# Patient Record
Sex: Female | Born: 1960 | Race: White | Hispanic: No | Smoking: Never smoker
Health system: Southern US, Community
[De-identification: ages and names within clinical notes are randomized; demographics above are authoritative.]

## PROBLEM LIST (undated history)

## (undated) DIAGNOSIS — E785 Hyperlipidemia, unspecified: Secondary | ICD-10-CM

## (undated) DIAGNOSIS — R21 Rash and other nonspecific skin eruption: Secondary | ICD-10-CM

## (undated) DIAGNOSIS — I10 Essential (primary) hypertension: Secondary | ICD-10-CM

## (undated) DIAGNOSIS — Z800 Family history of malignant neoplasm of digestive organs: Secondary | ICD-10-CM

## (undated) DIAGNOSIS — F329 Major depressive disorder, single episode, unspecified: Secondary | ICD-10-CM

## (undated) DIAGNOSIS — M1710 Unilateral primary osteoarthritis, unspecified knee: Secondary | ICD-10-CM

## (undated) DIAGNOSIS — Z978 Presence of other specified devices: Secondary | ICD-10-CM

## (undated) DIAGNOSIS — M329 Systemic lupus erythematosus, unspecified: Principal | ICD-10-CM

## (undated) DIAGNOSIS — M25562 Pain in left knee: Secondary | ICD-10-CM

## (undated) DIAGNOSIS — Z0000 Encounter for general adult medical examination without abnormal findings: Secondary | ICD-10-CM

## (undated) DIAGNOSIS — I428 Other cardiomyopathies: Secondary | ICD-10-CM

## (undated) DIAGNOSIS — M1990 Unspecified osteoarthritis, unspecified site: Secondary | ICD-10-CM

## (undated) DIAGNOSIS — R300 Dysuria: Secondary | ICD-10-CM

## (undated) DIAGNOSIS — M3219 Other organ or system involvement in systemic lupus erythematosus: Principal | ICD-10-CM

## (undated) DIAGNOSIS — I517 Cardiomegaly: Secondary | ICD-10-CM

## (undated) DIAGNOSIS — Z1231 Encounter for screening mammogram for malignant neoplasm of breast: Secondary | ICD-10-CM

## (undated) DIAGNOSIS — R102 Pelvic and perineal pain: Secondary | ICD-10-CM

## (undated) DIAGNOSIS — M1712 Unilateral primary osteoarthritis, left knee: Secondary | ICD-10-CM

## (undated) DIAGNOSIS — R5383 Other fatigue: Secondary | ICD-10-CM

## (undated) DIAGNOSIS — Z1211 Encounter for screening for malignant neoplasm of colon: Secondary | ICD-10-CM

## (undated) DIAGNOSIS — R0602 Shortness of breath: Secondary | ICD-10-CM

## (undated) DIAGNOSIS — I483 Typical atrial flutter: Principal | ICD-10-CM

## (undated) DIAGNOSIS — N63 Unspecified lump in unspecified breast: Secondary | ICD-10-CM

## (undated) DIAGNOSIS — R002 Palpitations: Secondary | ICD-10-CM

## (undated) DIAGNOSIS — R928 Other abnormal and inconclusive findings on diagnostic imaging of breast: Secondary | ICD-10-CM

## (undated) DIAGNOSIS — E782 Mixed hyperlipidemia: Secondary | ICD-10-CM

## (undated) DIAGNOSIS — Z96652 Presence of left artificial knee joint: Secondary | ICD-10-CM

## (undated) HISTORY — DX: Essential (primary) hypertension: I10

## (undated) HISTORY — DX: Presence of other specified devices: Z97.8

## (undated) HISTORY — DX: Morbid (severe) obesity due to excess calories: E66.01

## (undated) HISTORY — DX: Hyperlipidemia, unspecified: E78.5

## (undated) HISTORY — DX: Unilateral primary osteoarthritis, unspecified knee: M17.10

## (undated) HISTORY — DX: Major depressive disorder, single episode, unspecified: F32.9

## (undated) HISTORY — DX: Rash and other nonspecific skin eruption: R21

## (undated) HISTORY — DX: Family history of malignant neoplasm of digestive organs: Z80.0

---
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
## 2004-02-20 ENCOUNTER — Encounter: Payer: Self-pay | Admitting: Internal Medicine

## 2004-02-20 LAB — CONVERTED CEMR LAB

## 2004-12-31 ENCOUNTER — Ambulatory Visit: Payer: Self-pay | Admitting: Internal Medicine

## 2005-07-20 ENCOUNTER — Ambulatory Visit: Payer: Self-pay | Admitting: Internal Medicine

## 2005-08-03 ENCOUNTER — Ambulatory Visit: Payer: Self-pay | Admitting: Internal Medicine

## 2005-08-22 HISTORY — PX: GASTRIC BYPASS: SHX52

## 2005-09-26 ENCOUNTER — Encounter: Admission: RE | Admit: 2005-09-26 | Discharge: 2005-12-25 | Payer: Self-pay | Admitting: General Surgery

## 2005-09-29 ENCOUNTER — Ambulatory Visit (HOSPITAL_COMMUNITY): Admission: RE | Admit: 2005-09-29 | Discharge: 2005-09-29 | Payer: Self-pay | Admitting: General Surgery

## 2005-10-07 ENCOUNTER — Ambulatory Visit (HOSPITAL_COMMUNITY): Admission: RE | Admit: 2005-10-07 | Discharge: 2005-10-07 | Payer: Self-pay | Admitting: General Surgery

## 2006-01-30 ENCOUNTER — Encounter: Admission: RE | Admit: 2006-01-30 | Discharge: 2006-01-30 | Payer: Self-pay | Admitting: General Surgery

## 2006-02-20 ENCOUNTER — Inpatient Hospital Stay (HOSPITAL_COMMUNITY): Admission: RE | Admit: 2006-02-20 | Discharge: 2006-02-22 | Payer: Self-pay | Admitting: General Surgery

## 2006-02-21 ENCOUNTER — Encounter: Payer: Self-pay | Admitting: Vascular Surgery

## 2006-02-27 ENCOUNTER — Ambulatory Visit: Payer: Self-pay | Admitting: Internal Medicine

## 2006-03-01 ENCOUNTER — Encounter: Admission: RE | Admit: 2006-03-01 | Discharge: 2006-05-30 | Payer: Self-pay | Admitting: General Surgery

## 2006-07-05 ENCOUNTER — Encounter: Admission: RE | Admit: 2006-07-05 | Discharge: 2006-10-03 | Payer: Self-pay | Admitting: General Surgery

## 2006-12-18 ENCOUNTER — Encounter: Admission: RE | Admit: 2006-12-18 | Discharge: 2007-03-18 | Payer: Self-pay | Admitting: General Surgery

## 2007-03-12 ENCOUNTER — Encounter: Admission: RE | Admit: 2007-03-12 | Discharge: 2007-06-10 | Payer: Self-pay | Admitting: General Surgery

## 2007-03-16 ENCOUNTER — Ambulatory Visit: Payer: Self-pay | Admitting: Internal Medicine

## 2007-03-16 LAB — CONVERTED CEMR LAB
AST: 24 units/L (ref 0–37)
Albumin: 3.9 g/dL (ref 3.5–5.2)
BUN: 13 mg/dL (ref 6–23)
Basophils Absolute: 0 10*3/uL (ref 0.0–0.1)
Bilirubin Urine: NEGATIVE
CO2: 30 meq/L (ref 19–32)
Calcium: 8.9 mg/dL (ref 8.4–10.5)
Chloride: 102 meq/L (ref 96–112)
Folate: >20 ng/mL
GFR calc Af Amer: 117 mL/min
HCT: 38 % (ref 36.0–46.0)
HDL: 56.1 mg/dL (ref >39.0)
Ketones, ur: NEGATIVE mg/dL
LDL Cholesterol: 90 mg/dL (ref 0–99)
Leukocytes, UA: NEGATIVE
Neutro Abs: 5.1 10*3/uL (ref 1.4–7.7)
Nitrite: NEGATIVE
Potassium: 4.4 meq/L (ref 3.5–5.1)
Sodium: 138 meq/L (ref 135–145)
Total Bilirubin: 1.1 mg/dL (ref 0.3–1.2)
Urobilinogen, UA: 0.2 (ref 0.0–1.0)
VLDL: 12 mg/dL (ref 0–40)
Vitamin B-12: 337 pg/mL (ref 211–911)
WBC: 7.8 10*3/uL (ref 4.5–10.5)

## 2007-03-17 ENCOUNTER — Encounter: Payer: Self-pay | Admitting: Internal Medicine

## 2007-03-17 DIAGNOSIS — I10 Essential (primary) hypertension: Secondary | ICD-10-CM | POA: Insufficient documentation

## 2007-03-17 DIAGNOSIS — F3289 Other specified depressive episodes: Secondary | ICD-10-CM

## 2007-03-17 DIAGNOSIS — F329 Major depressive disorder, single episode, unspecified: Secondary | ICD-10-CM

## 2007-03-17 DIAGNOSIS — IMO0002 Reserved for concepts with insufficient information to code with codable children: Secondary | ICD-10-CM | POA: Insufficient documentation

## 2007-03-17 DIAGNOSIS — M1710 Unilateral primary osteoarthritis, unspecified knee: Secondary | ICD-10-CM

## 2007-03-17 DIAGNOSIS — E039 Hypothyroidism, unspecified: Secondary | ICD-10-CM | POA: Insufficient documentation

## 2007-03-17 HISTORY — DX: Major depressive disorder, single episode, unspecified: F32.9

## 2007-03-17 HISTORY — DX: Essential (primary) hypertension: I10

## 2007-03-17 HISTORY — DX: Reserved for concepts with insufficient information to code with codable children: IMO0002

## 2007-03-17 HISTORY — DX: Morbid (severe) obesity due to excess calories: E66.01

## 2007-03-17 HISTORY — DX: Other specified depressive episodes: F32.89

## 2007-04-21 ENCOUNTER — Ambulatory Visit (HOSPITAL_COMMUNITY): Admission: RE | Admit: 2007-04-21 | Discharge: 2007-04-21 | Payer: Self-pay | Admitting: Ophthalmology

## 2007-07-23 LAB — HM MAMMOGRAPHY: HM Mammogram: NORMAL

## 2008-05-13 ENCOUNTER — Telehealth (INDEPENDENT_AMBULATORY_CARE_PROVIDER_SITE_OTHER): Payer: Self-pay | Admitting: *Deleted

## 2008-05-15 ENCOUNTER — Telehealth (INDEPENDENT_AMBULATORY_CARE_PROVIDER_SITE_OTHER): Payer: Self-pay | Admitting: *Deleted

## 2008-05-15 ENCOUNTER — Ambulatory Visit: Payer: Self-pay | Admitting: Internal Medicine

## 2008-05-15 LAB — CONVERTED CEMR LAB
Basophils Absolute: 0 10*3/uL (ref 0.0–0.1)
Bilirubin, Direct: 0.2 mg/dL (ref 0.0–0.3)
Chloride: 104 meq/L (ref 96–112)
Cholesterol: 168 mg/dL (ref 0–200)
Creatinine, Ser: 0.8 mg/dL (ref 0.4–1.2)
Eosinophils Absolute: 0.1 10*3/uL (ref 0.0–0.7)
GFR calc Af Amer: 100 mL/min
Glucose, Bld: 91 mg/dL (ref 70–99)
HCT: 40.6 % (ref 36.0–46.0)
HDL: 60.1 mg/dL (ref >39.0)
Hemoglobin: 13.9 g/dL (ref 12.0–15.0)
Ketones, ur: NEGATIVE mg/dL
Monocytes Relative: 6.5 % (ref 3.0–12.0)
Neutro Abs: 6 10*3/uL (ref 1.4–7.7)
Nitrite: NEGATIVE
Potassium: 4.1 meq/L (ref 3.5–5.1)
RDW: 11.6 % (ref 11.5–14.6)
TSH: 1.53 microintl units/mL (ref 0.35–5.50)
Total Bilirubin: 0.8 mg/dL (ref 0.3–1.2)
Total Protein, Urine: NEGATIVE mg/dL
Total Protein: 6.7 g/dL (ref 6.0–8.3)
Triglycerides: 59 mg/dL (ref 0–149)
Urobilinogen, UA: 0.2 (ref 0.0–1.0)
WBC: 9 10*3/uL (ref 4.5–10.5)

## 2008-05-21 ENCOUNTER — Ambulatory Visit: Payer: Self-pay | Admitting: Internal Medicine

## 2008-09-02 ENCOUNTER — Encounter: Admission: RE | Admit: 2008-09-02 | Discharge: 2008-09-02 | Payer: Self-pay | Admitting: Chiropractic Medicine

## 2009-07-06 ENCOUNTER — Telehealth: Payer: Self-pay | Admitting: Internal Medicine

## 2009-07-22 LAB — CONVERTED CEMR LAB: Pap Smear: NORMAL

## 2009-11-17 ENCOUNTER — Ambulatory Visit: Payer: Self-pay | Admitting: Internal Medicine

## 2009-11-17 DIAGNOSIS — Z978 Presence of other specified devices: Secondary | ICD-10-CM

## 2009-11-17 DIAGNOSIS — R21 Rash and other nonspecific skin eruption: Secondary | ICD-10-CM

## 2009-11-17 HISTORY — DX: Rash and other nonspecific skin eruption: R21

## 2009-11-17 HISTORY — DX: Presence of other specified devices: Z97.8

## 2009-11-17 LAB — CONVERTED CEMR LAB
ALT: 18 units/L (ref 0–35)
AST: 31 units/L (ref 0–37)
BUN: 8 mg/dL (ref 6–23)
Basophils Relative: 0.2 % (ref 0.0–3.0)
Calcium: 9.2 mg/dL (ref 8.4–10.5)
Creatinine, Ser: 0.8 mg/dL (ref 0.4–1.2)
Eosinophils Relative: 1 % (ref 0.0–5.0)
GFR calc non Af Amer: 81.6 mL/min (ref >60)
HCT: 43.7 % (ref 36.0–46.0)
Hemoglobin: 14.2 g/dL (ref 12.0–15.0)
Ketones, ur: NEGATIVE mg/dL
LDL Cholesterol: 92 mg/dL (ref 0–99)
Lymphocytes Relative: 21.1 % (ref 12.0–46.0)
Neutro Abs: 4.5 10*3/uL (ref 1.4–7.7)
Platelets: 254 10*3/uL (ref 150.0–400.0)
Potassium: 4.3 meq/L (ref 3.5–5.1)
RDW: 12.6 % (ref 11.5–14.6)
Sodium: 142 meq/L (ref 135–145)
Specific Gravity, Urine: 1.025 (ref 1.000–1.030)
TSH: 1.47 microintl units/mL (ref 0.35–5.50)
Total Bilirubin: 0.8 mg/dL (ref 0.3–1.2)
Total CHOL/HDL Ratio: 2
Total Protein, Urine: NEGATIVE mg/dL
Urine Glucose: NEGATIVE mg/dL
Urobilinogen, UA: 0.2 (ref 0.0–1.0)
VLDL: 20.2 mg/dL (ref 0.0–40.0)
pH: 6 (ref 5.0–8.0)

## 2009-11-18 LAB — CONVERTED CEMR LAB: Vit D, 25-Hydroxy: 34 ng/mL (ref 30–89)

## 2010-09-23 NOTE — Assessment & Plan Note (Signed)
Summary: REFILLS/MAY NEED LABS/NWS   #   Vital Signs:  Patient profile:   50 year old female Height:      65.5 inches Weight:      202.50 pounds BMI:     33.31 O2 Sat:      97 % on Room air Temp:     97.9 degrees F oral Pulse rate:   59 / minute BP sitting:   114 / 82  (left arm) Cuff size:   regular  Vitals Entered ByZella Ball Ewing (November 17, 2009 8:16 AM)  O2 Flow:  Room air  Preventive Care Screening  Pap Smear:    Date:  07/22/2009    Results:  normal   CC: Medication refills, labs, rash on face/RE   CC:  Medication refills, labs, and rash on face/RE.  History of Present Illness: much stress with work adn recent completed divorce;  has about 10 to 15 lbs;  has some light snoring but no daytime somnolence;  plans to do more gym work, has a known leak in the right breast implant - needs mamogram ;  Pt denies CP, sob, doe, wheezing, orthopnea, pnd, worsening LE edema, palps, dizziness or syncope   Pt denies new neuro symptoms such as headache, facial or extremity weakness   Problems Prior to Update: 1)  Rash-nonvesicular  (ICD-782.1) 2)  Preventive Health Care  (ICD-V70.0) 3)  Degenerative Joint Disease, Right Knee  (ICD-715.96) 4)  Morbid Obesity  (ICD-278.01) 5)  Depression  (ICD-311) 6)  Hypothyroidism  (ICD-244.9) 7)  Hypertension  (ICD-401.9)  Medications Prior to Update: 1)  Cetirizine Hcl 10 Mg  Tabs (Cetirizine Hcl) .Marland Kitchen.. 1 By Mouth Once Daily As Needed 2)  Levothyroxine Sodium 112 Mcg  Tabs (Levothyroxine Sodium) .... Take 1 By Mouth Once Daily Overdue For Yearly Physical No Addtioanl Refills Until Appt 3)  Zoloft 50 Mg Tabs (Sertraline Hcl) .Marland Kitchen.. 1 Daily 4)  Naproxen 500 Mg Tabs (Naproxen) .... 2 X Daily 5)  Nefazodone Hcl 200 Mg Tabs (Nefazodone Hcl) .Marland Kitchen.. 1 Tab in The Am, 2 Tabs in The Pm 6)  Temazepam 30 Mg Caps (Temazepam) .... Prn 7)  Alprazolam 0.25 Mg Tabs (Alprazolam) .Marland Kitchen.. 1 Daily 8)  Multivital  Tabs (Multiple Vitamins-Minerals) 9)  B Complex 100   Tabs (B Complex Vitamins) 10)  Iron 11)  Cal/mag Citrate 250-125 Mg Tabs (Calcium-Magnesium) 12)  Benicar 20 Mg Tabs (Olmesartan Medoxomil) .... 1/2 By Mouth Once Daily  Current Medications (verified): 1)  Cetirizine Hcl 10 Mg  Tabs (Cetirizine Hcl) .Marland Kitchen.. 1 By Mouth Once Daily As Needed 2)  Levothyroxine Sodium 112 Mcg  Tabs (Levothyroxine Sodium) .... Take 1 By Mouth Once Daily 3)  Zoloft 50 Mg Tabs (Sertraline Hcl) .Marland Kitchen.. 1 Daily 4)  Naproxen 500 Mg Tabs (Naproxen) .... 2 X Daily 5)  Nefazodone Hcl 200 Mg Tabs (Nefazodone Hcl) .Marland Kitchen.. 1 Tab in The Am, 2 Tabs in The Pm 6)  Temazepam 30 Mg Caps (Temazepam) .... Prn 7)  Alprazolam 0.25 Mg Tabs (Alprazolam) .Marland Kitchen.. 1 Daily 8)  Multivital  Tabs (Multiple Vitamins-Minerals) 9)  B Complex 100  Tabs (B Complex Vitamins) 10)  Iron 11)  Cal/mag Citrate 250-125 Mg Tabs (Calcium-Magnesium) 12)  Benicar 20 Mg Tabs (Olmesartan Medoxomil) .Marland Kitchen.. 1 By Mouth Once Daily 13)  Triamcinolone Acetonide 0.5 % Crea (Triamcinolone Acetonide) .... Use Asd Two Times A Day As Needed  Allergies (verified): 1)  ! * Opiates 2)  ! Sulfa  Past History:  Past Medical History:  Last updated: 03/17/2007 Hypertension Hypothyroidism Depression Morbid Obesity R Knee DJD Alergies  Past Surgical History: Last updated: 03/17/2007 Gastric Bypass- 2007 Breast Augmentation -1981 Jaw Surgery- 1980  Family History: Last updated: 05/21/2008 DM - both parents father with heart disease, s/p MI x 3, elevated cholesterol grandfather and mother with dementia uncle with colon cancer grandfather with TIA grandmother with CHF  Social History: Last updated: 11/17/2009 no children work - [employer] Never Smoked Alcohol use-yes - social Divorced  Risk Factors: Smoking Status: never (05/21/2008)  Social History: Reviewed history from 05/21/2008 and no changes required. no children work - [employer] Never Smoked Alcohol use-yes - social Divorced  Review of  Systems  The patient denies anorexia, fever, vision loss, decreased hearing, hoarseness, chest pain, syncope, dyspnea on exertion, peripheral edema, prolonged cough, headaches, hemoptysis, abdominal pain, melena, hematochezia, severe indigestion/heartburn, hematuria, muscle weakness, transient blindness, difficulty walking, depression, unusual weight change, abnormal bleeding, enlarged lymph nodes, and angioedema.         all otherwise negative per pt -  - had some recent spotting with the IUD  s/p neg endometrial biopsy recent per GYN, also with small rash to glabellar area of the forehead with itching and mild erythema  Physical Exam  General:  alert and overweight-appearing.   Head:  normocephalic and atraumatic.   Eyes:  vision grossly intact, pupils equal, and pupils round.   Ears:  R ear normal and L ear normal.   Nose:  no external deformity and no nasal discharge.   Mouth:  no gingival abnormalities and pharynx pink and moist.   Neck:  supple and no masses.   Lungs:  normal respiratory effort and normal breath sounds.   Heart:  normal rate and regular rhythm.   Abdomen:  soft, non-tender, and normal bowel sounds.   Msk:  no joint tenderness and no joint swelling.   Extremities:  no edema, no erythema  Neurologic:  cranial nerves II-XII intact and strength normal in all extremities.   Skin:  mild erythema glabellar area with slight swelling, nontender Psych:  not depressed appearing and slightly anxious.     Impression & Recommendations:  Problem # 1:  Preventive Health Care (ICD-V70.0)  Overall doing well, age appropriate education and counseling updated and referral for appropriate preventive services done unless declined, immunizations up to date or declined, diet counseling done if overweight, urged to quit smoking if smokes , most recent labs reviewed and current ordered if appropriate, ecg reviewed or declined (interpretation per ECG scanned in the EMR if done); information  regarding Medicare Prevention requirements given if appropriate   Orders: T-Vitamin D (25-Hydroxy) (16109-60454) TLB-BMP (Basic Metabolic Panel-BMET) (80048-METABOL) TLB-CBC Platelet - w/Differential (85025-CBCD) TLB-Hepatic/Liver Function Pnl (80076-HEPATIC) TLB-Lipid Panel (80061-LIPID) TLB-TSH (Thyroid Stimulating Hormone) (84443-TSH) TLB-Udip ONLY (81003-UDIP)  Problem # 2:  RASH-NONVESICULAR (ICD-782.1)  Her updated medication list for this problem includes:    Triamcinolone Acetonide 0.5 % Crea (Triamcinolone acetonide) ..... Use asd two times a day as needed treat as above, f/u any worsening signs or symptoms   Problem # 3:  BREAST IMPLANTS, BILATERAL, HX OF (ICD-V43.82)  with known leak - for plastic surg referral, then mamogram to be pursued  Orders: Misc. Referral (Misc. Ref)  Problem # 4:  HYPERTENSION (ICD-401.9)  Her updated medication list for this problem includes:    Benicar 20 Mg Tabs (Olmesartan medoxomil) .Marland Kitchen... 1 by mouth once daily  BP today: 114/82 Prior BP: 114/72 (05/21/2008)  Labs Reviewed: K+:  4.1 (05/15/2008) Creat: : 0.8 (05/15/2008)   Chol: 168 (05/15/2008)   HDL: 60.1 (05/15/2008)   LDL: 96 (05/15/2008)   TG: 59 (05/15/2008) stable overall by hx and exam, ok to continue meds/tx as is   Complete Medication List: 1)  Cetirizine Hcl 10 Mg Tabs (Cetirizine hcl) .Marland Kitchen.. 1 by mouth once daily as needed 2)  Levothyroxine Sodium 112 Mcg Tabs (Levothyroxine sodium) .... Take 1 by mouth once daily 3)  Zoloft 50 Mg Tabs (Sertraline hcl) .Marland Kitchen.. 1 daily 4)  Naproxen 500 Mg Tabs (Naproxen) .... 2 x daily 5)  Nefazodone Hcl 200 Mg Tabs (Nefazodone hcl) .Marland Kitchen.. 1 tab in the am, 2 tabs in the pm 6)  Temazepam 30 Mg Caps (Temazepam) .... Prn 7)  Alprazolam 0.25 Mg Tabs (Alprazolam) .Marland Kitchen.. 1 daily 8)  Multivital Tabs (Multiple vitamins-minerals) 9)  B Complex 100 Tabs (B complex vitamins) 10)  Iron  11)  Cal/mag Citrate 250-125 Mg Tabs (Calcium-magnesium) 12)  Benicar  20 Mg Tabs (Olmesartan medoxomil) .Marland Kitchen.. 1 by mouth once daily 13)  Triamcinolone Acetonide 0.5 % Crea (Triamcinolone acetonide) .... Use asd two times a day as needed  Patient Instructions: 1)  You will be contacted about the referral(s) to: Dr Stephens November, plastic surgury 2)  please consider mammogram after the plastic surgeon 3)  Please go to the Lab in the basement for your blood and/or urine tests today 4)  Continue all previous medications as before this visit 5)  Please schedule a follow-up appointment in 1 year or sooner if needed 6)  remember to take only HALF of the benicar (even though the prescription says one per day, to save money) Prescriptions: TRIAMCINOLONE ACETONIDE 0.5 % CREA (TRIAMCINOLONE ACETONIDE) use asd two times a day as needed  #1 x 1   Entered and Authorized by:   Corwin Levins MD   Signed by:   Corwin Levins MD on 11/17/2009   Method used:   Print then Give to Patient   RxID:   [id number] LEVOTHYROXINE SODIUM 112 MCG  TABS (LEVOTHYROXINE SODIUM) TAKE 1 by mouth once daily  #90 x 3   Entered and Authorized by:   Corwin Levins MD   Signed by:   Corwin Levins MD on 11/17/2009   Method used:   Print then Give to Patient   RxID:   [id number] BENICAR 20 MG TABS (OLMESARTAN MEDOXOMIL) 1 by mouth once daily  #90 x 3   Entered and Authorized by:   Corwin Levins MD   Signed by:   Corwin Levins MD on 11/17/2009   Method used:   Print then Give to Patient   RxID:   [id number] LEVOTHYROXINE SODIUM 112 MCG  TABS (LEVOTHYROXINE SODIUM) TAKE 1 by mouth once daily  #90 x 3   Entered and Authorized by:   Corwin Levins MD   Signed by:   Corwin Levins MD on 11/17/2009   Method used:   Print then Give to Patient   RxID:   [id number] CETIRIZINE HCL 10 MG  TABS (CETIRIZINE HCL) 1 by mouth once daily as needed  #90 x 3   Entered and Authorized by:   Corwin Levins MD   Signed by:   Corwin Levins MD on 11/17/2009   Method used:   Print then Give to Patient   RxID:    [id number]

## 2010-12-07 ENCOUNTER — Other Ambulatory Visit: Payer: Self-pay | Admitting: Internal Medicine

## 2010-12-07 ENCOUNTER — Other Ambulatory Visit (INDEPENDENT_AMBULATORY_CARE_PROVIDER_SITE_OTHER): Payer: BLUE CROSS/BLUE SHIELD

## 2010-12-07 DIAGNOSIS — Z0000 Encounter for general adult medical examination without abnormal findings: Secondary | ICD-10-CM

## 2010-12-07 LAB — HEPATIC FUNCTION PANEL
AST: 24 U/L (ref 0–37)
Total Bilirubin: 0.9 mg/dL (ref 0.3–1.2)

## 2010-12-07 LAB — URINALYSIS
Bilirubin Urine: NEGATIVE
Ketones, ur: NEGATIVE
Leukocytes, UA: NEGATIVE
Nitrite: NEGATIVE
Specific Gravity, Urine: 1.02 (ref 1.000–1.030)
Urobilinogen, UA: 0.2 (ref 0.0–1.0)

## 2010-12-07 LAB — BASIC METABOLIC PANEL
Calcium: 9.2 mg/dL (ref 8.4–10.5)
Creatinine, Ser: 0.9 mg/dL (ref 0.4–1.2)
GFR: 73.74 mL/min (ref >60.00)

## 2010-12-07 LAB — LIPID PANEL
Cholesterol: 182 mg/dL (ref 0–200)
LDL Cholesterol: 96 mg/dL (ref 0–99)

## 2010-12-07 LAB — CBC WITH DIFFERENTIAL/PLATELET
Basophils Absolute: 0 10*3/uL (ref 0.0–0.1)
Eosinophils Absolute: 0.2 10*3/uL (ref 0.0–0.7)
HCT: 39.5 % (ref 36.0–46.0)
Lymphs Abs: 2.1 10*3/uL (ref 0.7–4.0)
MCHC: 33.9 g/dL (ref 30.0–36.0)
MCV: 99.2 fl (ref 78.0–100.0)
Monocytes Absolute: 0.6 10*3/uL (ref 0.1–1.0)
Monocytes Relative: 8.6 % (ref 3.0–12.0)
Neutro Abs: 4.2 10*3/uL (ref 1.4–7.7)
RBC: 3.98 Mil/uL (ref 3.87–5.11)
WBC: 7 10*3/uL (ref 4.5–10.5)

## 2010-12-11 DIAGNOSIS — E039 Hypothyroidism, unspecified: Secondary | ICD-10-CM

## 2010-12-12 ENCOUNTER — Encounter: Payer: Self-pay | Admitting: Internal Medicine

## 2010-12-12 DIAGNOSIS — Z0000 Encounter for general adult medical examination without abnormal findings: Secondary | ICD-10-CM | POA: Insufficient documentation

## 2010-12-14 ENCOUNTER — Ambulatory Visit (INDEPENDENT_AMBULATORY_CARE_PROVIDER_SITE_OTHER): Payer: BLUE CROSS/BLUE SHIELD | Admitting: Internal Medicine

## 2010-12-14 ENCOUNTER — Encounter: Payer: Self-pay | Admitting: Internal Medicine

## 2010-12-14 VITALS — BP 124/82 | HR 65 | Temp 98.1°F | Ht 65.0 in | Wt 218.0 lb

## 2010-12-14 DIAGNOSIS — Z0000 Encounter for general adult medical examination without abnormal findings: Secondary | ICD-10-CM

## 2010-12-14 DIAGNOSIS — E785 Hyperlipidemia, unspecified: Secondary | ICD-10-CM

## 2010-12-14 MED ORDER — LEVOTHYROXINE SODIUM 112 MCG PO TABS: 112.0000 ug | ORAL_TABLET | Freq: Every day | ORAL | Status: DC

## 2010-12-14 MED ORDER — OLMESARTAN MEDOXOMIL 20 MG PO TABS: 20.0000 mg | ORAL_TABLET | Freq: Every day | ORAL | Status: DC

## 2010-12-14 MED ORDER — CETIRIZINE HCL 10 MG PO TABS: 10.0000 mg | ORAL_TABLET | Freq: Every day | ORAL | Status: DC

## 2010-12-14 NOTE — Patient Instructions (Signed)
Continue all other medications as before Please continue lower cholesterol diet and exercise as you have planned Your refills were sent to the pharmacy Please return in 1 year for your yearly visit, or sooner if needed, with Lab testing done 3-5 days before

## 2010-12-19 ENCOUNTER — Encounter: Payer: Self-pay | Admitting: Internal Medicine

## 2010-12-19 DIAGNOSIS — E785 Hyperlipidemia, unspecified: Secondary | ICD-10-CM

## 2010-12-19 HISTORY — DX: Hyperlipidemia, unspecified: E78.5

## 2010-12-19 NOTE — Assessment & Plan Note (Addendum)
stable overall by hx and exam, most recent lab reviewed with pt, and pt to continue medical treatment as before, to follow lower chol diet  Lab Results  Component Value Date   LDLCALC 96 12/07/2010

## 2010-12-19 NOTE — Assessment & Plan Note (Signed)

## 2010-12-19 NOTE — Progress Notes (Signed)
Subjective:    Patient ID: Shari Navarro, female    DOB: 30-Dec-1960, 50 y.o.   MRN: [id number]  HPI  Here for wellness and f/u;  Overall doing ok;  Pt denies CP, worsening SOB, DOE, wheezing, orthopnea, PND, worsening LE edema, palpitations, dizziness or syncope.  Pt denies neurological change such as new Headache, facial or extremity weakness.  Pt denies polydipsia, polyuria, or low sugar symptoms. Pt states overall good compliance with treatment and medications, good tolerability, and trying to follow lower cholesterol diet.  Pt denies worsening depressive symptoms, suicidal ideation or panic. No fever, wt loss, night sweats, loss of appetite, or other constitutional symptoms.  Pt states good ability with ADL's, low fall risk, home safety reviewed and adequate, no significant changes in hearing or vision, and occasionally active with exercise.  Denies hyper or hypo thyroid symptoms such as voice, skin or hair change. Past Medical History  Diagnosis Date  . Morbid obesity 03/17/2007  . DEPRESSION 03/17/2007  . HYPERTENSION 03/17/2007  . DEGENERATIVE JOINT DISEASE, RIGHT KNEE 03/17/2007  . RASH-NONVESICULAR 11/17/2009  . BREAST IMPLANTS, BILATERAL, HX OF 11/17/2009   Past Surgical History  Procedure Date  . Gastric bypass 2007  . Breast enhancement surgery 1981  . Jaw surgury 1980    reports that she has never smoked. She does not have any smokeless tobacco history on file. She reports that she drinks alcohol. Her drug history not on file. family history includes Cancer in her other; Diabetes in her father and mother; Heart attack in her father; Heart disease in her father and other; Hyperlipidemia in her father; and Transient ischemic attack in her other. Allergies  Allergen Reactions  . Sulfonamide Derivatives    Current Outpatient Prescriptions on File Prior to Visit  Medication Sig Dispense Refill  . ALPRAZolam (XANAX) 0.25 MG tablet Take 0.25 mg by mouth daily.        . B Complex Vitamins  (B COMPLEX 100 PO) Take by mouth daily.        . Calcium-Magnesium 250-125 MG TABS Take by mouth daily.        . Multiple Vitamin (MULTIVITAMIN) capsule Take 1 capsule by mouth daily.        . nefazodone (SERZONE) 200 MG tablet 1 tablet in the morning and 2 tablets in the evening       . sertraline (ZOLOFT) 50 MG tablet Take 50 mg by mouth daily.        . temazepam (RESTORIL) 30 MG capsule Take 30 mg by mouth as needed.        . naproxen (NAPROSYN) 500 MG tablet Take 500 mg by mouth 2 (two) times daily.        Marland Kitchen triamcinolone (KENALOG) 0.5 % cream Apply topically 2 (two) times daily.         Review of Systems Review of Systems  Constitutional: Negative for diaphoresis, activity change, appetite change and unexpected weight change.  HENT: Negative for hearing loss, ear pain, facial swelling, mouth sores and neck stiffness.   Eyes: Negative for pain, redness and visual disturbance.  Respiratory: Negative for shortness of breath and wheezing.   Cardiovascular: Negative for chest pain and palpitations.  Gastrointestinal: Negative for diarrhea, blood in stool, abdominal distention and rectal pain.  Genitourinary: Negative for hematuria, flank pain and decreased urine volume.  Musculoskeletal: Negative for myalgias and joint swelling.  Skin: Negative for color change and wound.  Neurological: Negative for syncope and numbness.  Hematological: Negative for adenopathy.  Psychiatric/Behavioral: Negative for hallucinations, self-injury, decreased concentration and agitation.      Objective:   Physical Exam BP 124/82  Pulse 65  Temp(Src) 98.1 F (36.7 C) (Oral)  Ht 5\' 5"  (1.651 m)  Wt 218 lb (98.884 kg)  BMI 36.28 kg/m2  SpO2 96% Physical Exam  VS noted Constitutional: Pt is oriented to person, place, and time. Appears well-developed and well-nourished.  HENT:  Head: Normocephalic and atraumatic.  Right Ear: External ear normal.  Left Ear: External ear normal.  Nose: Nose normal.    Mouth/Throat: Oropharynx is clear and moist.  Eyes: Conjunctivae and EOM are normal. Pupils are equal, round, and reactive to light.  Neck: Normal range of motion. Neck supple. No JVD present. No tracheal deviation present.  Cardiovascular: Normal rate, regular rhythm, normal heart sounds and intact distal pulses.   Pulmonary/Chest: Effort normal and breath sounds normal.  Abdominal: Soft. Bowel sounds are normal. There is no tenderness.  Musculoskeletal: Normal range of motion. Exhibits no edema.  Lymphadenopathy:  Has no cervical adenopathy.  Neurological: Pt is alert and oriented to person, place, and time. Pt has normal reflexes. No cranial nerve deficit.  Skin: Skin is warm and dry. No rash noted.  Psychiatric:  Has  normal mood and affect. Behavior is normal. except 1+ nervous       Assessment & Plan:

## 2011-01-04 NOTE — Op Note (Signed)
NAMESALLE, Shari Navarro                  ACCOUNT NO.:  [id number]   MEDICAL RECORD NO.:  [id number]          PATIENT TYPE:  AMB   LOCATION:  SDS                          FACILITY:  [facility]   PHYSICIAN:  Alford Highland. Rankin, M.D.   DATE OF BIRTH:  05-17-1961   DATE OF PROCEDURE:  04/21/2007  DATE OF DISCHARGE:                               OPERATIVE REPORT   PREOPERATIVE DIAGNOSIS:  Rhegmatogenous detachment - macula on, left  eye.   POSTOPERATIVE DIAGNOSIS:  Rhegmatogenous detachment - macula on, left  eye.   SURGICAL PROCEDURE:  Scleral buckle retinal cryopexy left eye using 287  solid silicone explant.   SURGEON:  Alford Highland. Rankin, M.D.   ANESTHESIA:  General endotracheal anesthesia.   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  The  patient understands this is an attempt to reattach the retina and  release the traction.  She understands the risks of anesthesia including  rare occurrence of death, but also to the eye including to but not  limited to from the condition, surgical repair, hemorrhage, infection,  scarring, need for further surgery, no change in vision, loss of vision,  and progression of disease despite intervention.  Appropriate signed  consent was obtained and the patient taken to the operating room.   DESCRIPTION OF PROCEDURE:  In the operating room, appropriate monitors  followed by mild sedation.  General endotracheal anesthesia was  instituted without difficulty.  The left periocular region was then  sterilely prepped and draped in the usual ophthalmic fashion.  The lid  speculum applied.  The supratemporal quadrant was then opened and the  superior and the lateral rectus muscles were isolated on 2-0 silk ties  and direct  ophthalmoscopy was then performed to place the retinal  cryopexy around the retinal break, which was found.  Other breaks along  the vitreous base were not found but a spot was applied.   The 287 solid silicone explant was then placed in the supratemporal  quadrant and tied temporarily.  During this procedure, there was  spontaneous drainage of retinal fluid.  This site was then cryopexy  applied as well.  At this time, the retina flattened nicely on the  buccal.  Excellent cryopexy was obtained.  At this time, the buccal was  then  trimmed.  The sutures were then tied permanently.  Conjunctiva was then  irrigated copiously with bug juice.  Conjunctiva and Tenons were then  closed in layers of 7-0 Vicryl.  Subconjunctival Decadron applied.  Sterile patch and Fox shield were applied.  The patient tolerated the  procedure without complication.      Alford Highland Rankin, M.D.  Electronically Signed     GAR/MEDQ  D:  04/21/2007  T:  04/22/2007  Job:  [id number]   cc:   Richarda Overlie, M.D.

## 2011-01-07 NOTE — Op Note (Signed)
NAMEYOLANI, VO                  ACCOUNT NO.:  [id number]   MEDICAL RECORD NO.:  [id number]          PATIENT TYPE:  INP   LOCATION:  0002                         FACILITY:  [facility]   PHYSICIAN:  Sharlet Salina T. Hoxworth, M.D.DATE OF BIRTH:  March 04, 1961   DATE OF PROCEDURE:  02/20/2006  DATE OF DISCHARGE:                                 OPERATIVE REPORT   PREOPERATIVE DIAGNOSIS:  Morbid obesity.   POSTOPERATIVE DIAGNOSIS:  Morbid obesity.   SURGICAL PROCEDURE:  Laparoscopic Roux-en-Y gastric bypass.   SURGEON:  Dr. Johna Sheriff.   ASSISTANT:  Dr. Ovidio Kin.   ANESTHESIA:  General.   BRIEF HISTORY:  Ms. Augustina Mood is a 50 year old female with longstanding morbid  obesity unresponsive to medical management and comorbidities of  hypertension, arthritis, GERD and hyperlipidemia.  After an extensive  discussion and preoperative workup detailed elsewhere, we have elected to  proceed with laparoscopic Roux-en-Y gastric bypass for her morbid obesity.  Her preop weight was 256 pounds, BMI 41.2.  Following mechanical antibiotic  bowel prep at home, she is brought to the operating room for this procedure.   DESCRIPTION OF OPERATION:  The patient was brought to the operating room,  placed in the supine position on the operating table.  General endotracheal  anesthesia was induced.  The head was widely sterilely prepped.  Correct  patient and procedure were verified.  The patient had received preoperative  subcutaneous Lovenox and broad-spectrum antibiotics IV and PAS were placed.  Local anesthesia was used to infiltrate the trocar sites prior to the  incisions.  A 1-cm incision was made in the left subcostal space in  approximately midclavicular line and access was obtained without difficulty  with the OptiVu trocar and pneumoperitoneum established.  Under direct  vision, an 11-mm trocar was placed in the right paraxiphoid area through the  falciform ligament, another 11-mm trocar in the right upper  quadrant,  another 11-mm trocar just above and to the left of the umbilicus for the  camera port and a 5-mm trocar placed in the left flank.  The omentum was  elevated, the ligament of Treitz identified and a 40-cm biliopancreatic limb  measured.  This reached easily up over the colon toward the liver edge.  The  small bowel at this point was divided with a single firing of the Echelon 60-  mm white load stapler.  The mesentery was further divided a short distance  with the Harmonic Scalpel for mobility.  Penrose drain was sutured to the  end of the efferent limb for identification.  Following this, a 100-cm  efferent limb was measured.  At this point, a side-to-side jejunojejunostomy  was created with enterotomies with the Harmonic Scalpel was a single firing  of the white load 45-mm stapler.  The common enterotomy was then closed with  running 2-0 Vicryl begun at either end and tied centrally.  Examination of  the closure revealed a couple little puckered areas of jejunum with slight  gaps and each of these was closed with an interrupted 2-0 Vicryl with the  suture line appearing very secure at  this point.  The jejunojejunostomy  mesenteric defect was then closed with a running 2-0 silk suture.  Following  this, the patient was placed in deep transverse Trendelenburg and a  Nathanson retractor inserted through a 5-mm site in the subxiphoid area.  The left lobe of the liver was elevated and there was good exposure of the  hiatus of the stomach.  The peritoneum over the left crus was incised and  dissection carried back down toward the retrogastric space along the left  crus.  All tubes were removed from the stomach.  A point of division along  the lesser curve was chosen 4 to 5 cm from the EG junction.  This area was  cleared of mesentery.  Dissection was carried along the gastric wall toward  the lesser sac which was entered.  Initial firing of the Echelon 60-mm gold  stapler was  performed at right angles to the lesser curve over about 40 mm.  Two further firings of the Echelon blue load 60 stapler was then carried up  toward the previously dissected angle of His.  Some further posterior  gastric attachments were taken down with the Harmonic Scalpel, and a final  blue load 45-mm stapler was used to completely divide the small tubular  gastric pouch.  The staple line of the gastric remnant was oversewn with  running 2-0 silk.  Following this, the Roux limb was brought up to the  gastric pouch, the candy cane facing towards the patient's left.  It was  under no tension.  A posterior row of running 2-0 Vicryl was placed between  the Roux limb of the gastric pouch.  The Maricela Curet was advanced into the pouch  and then enterotomies made with the Harmonic Scalpel in the pouch and the  jejunum.  The 45-mm blue load linear stapler was then used to create the  anastomosis without difficulty about 2.0 to 2.5 cm in diameter.  Staple line  was inspected and was intact without bleeding.  The common enterotomy was  then closed with running 2-0 Vicryl begun at either end of the enterotomy  and tied centrally.  Following this, the Ewald tube was passed down through  the anastomosis.  An anterior seromuscular row of 2-0 Vicryl was then  placed.  The Ewald tube was removed.  With the outlet of the pouch clamped  just beyond the anastomosis and with the pouch under saline, Dr. Ezzard Standing  performed endoscopy, tensely inflated the pouch with air and there was no  evidence of leak.  The pouch was then decompressed and all saline suctioned.  Tisseel tissue sealant was used to coat the anastomosis.  Peterson's defect  had been closed with a running 2-0 silk suture.  Trocars were removed and  all CO2 evacuated.  Skin was closed with staples.  Sponge, needle and  instrument counts were correct.  Dry sterile dressings were applied, and the patient was taken to recovery in good condition.       Lorne Skeens. Hoxworth, M.D.  Electronically Signed     BTH/MEDQ  D:  02/20/2006  T:  02/20/2006  Job:  [id number]

## 2011-01-07 NOTE — Op Note (Signed)
NAMEKIRBI, FARRUGIA                  ACCOUNT NO.:  [id number]   MEDICAL RECORD NO.:  [id number]          PATIENT TYPE:  INP   LOCATION:  1516                         FACILITY:  [facility]   PHYSICIAN:  Sandria Bales. Ezzard Standing, M.D.  DATE OF BIRTH:  [birth year]   DATE OF PROCEDURE:  02/20/2006  DATE OF DISCHARGE:                                 OPERATIVE REPORT   PREOPERATIVE DIAGNOSIS:  Status post laparoscopic Roux-en-Y gastric bypass.   POSTOPERATIVE DIAGNOSIS:  Status post laparoscopic Roux-en-Y gastric bypass  for morbid obesity.   PROCEDURE:  Esophagogastroduodenoscopy.   SURGEON:  Sandria Bales. Ezzard Standing, M.D.   ANESTHESIA:  General endotracheal.   ESTIMATED BLOOD LOSS:  Minimal.   INDICATIONS FOR PROCEDURE:  Ms. Augustina Mood is a morbidly obese white female who  has undergone a laparoscopic Roux-en-Y gastric bypass by Dr. Jaclynn Guarneri.  While the patient is asleep, I am going an upper endoscopy to document pouch  size to check the anastomosis.   OPERATIVE NOTE:  The patient, in a supine position with her arms out to her  side, Dr. Johna Sheriff was manning the upper endoscope.  I  then passed the  Olympus endoscope down her mouth without difficulty.   I was able to advance the scope in to the pouch and then visualized the  gastrojejunal anastomosis at 42-43 cm. The anastomosis was widely patent.  While I was in the stomach, I insufflated the stomach with air and Dr.  Johna Sheriff had the small bowel clamped off and there was no leak from the  anastomosis site.  I then irrigated the stomach pouch. The mucosa looked  viable.  The staple line looked good.  The esophagogastric junction was at  about 38 cm for a 4-5 cm pouch. The esophagus itself was otherwise  unremarkable.  The scope was then withdrawn without difficulty.   The patient tolerated the procedure well.  Dr. Johna Sheriff will dictate the  remainder of the operation.      Sandria Bales. Ezzard Standing, M.D.  Electronically Signed     DHN/MEDQ  D:   02/20/2006  T:  02/20/2006  Job:  [id number]

## 2011-06-03 LAB — CBC
Hemoglobin: 13.6
MCHC: 33.7
Platelets: 276
RDW: 12.9

## 2011-06-03 LAB — BASIC METABOLIC PANEL
BUN: 18
CO2: 27
Calcium: 9.2
Creatinine, Ser: 0.64
Glucose, Bld: 94

## 2011-11-30 ENCOUNTER — Other Ambulatory Visit: Payer: Self-pay | Admitting: Internal Medicine

## 2012-01-20 ENCOUNTER — Telehealth: Payer: Self-pay | Admitting: Internal Medicine

## 2012-01-20 ENCOUNTER — Other Ambulatory Visit: Payer: Self-pay | Admitting: Internal Medicine

## 2012-01-20 DIAGNOSIS — Z0000 Encounter for general adult medical examination without abnormal findings: Secondary | ICD-10-CM

## 2012-01-20 NOTE — Telephone Encounter (Signed)
Orders done  benicar refilled may 31

## 2012-01-20 NOTE — Telephone Encounter (Signed)
The pt called and is hoping to get labs before her cpe  She is also hoping to get a refill of Benicar   Thanks!

## 2012-01-20 NOTE — Telephone Encounter (Signed)
Called the patient left detailed message on home number and cell number of all information.

## 2012-02-03 ENCOUNTER — Other Ambulatory Visit: Payer: Self-pay | Admitting: Internal Medicine

## 2012-04-03 ENCOUNTER — Encounter: Payer: BLUE CROSS/BLUE SHIELD | Admitting: Internal Medicine

## 2012-04-04 ENCOUNTER — Other Ambulatory Visit: Payer: Self-pay | Admitting: Internal Medicine

## 2012-04-21 ENCOUNTER — Other Ambulatory Visit: Payer: Self-pay | Admitting: Internal Medicine

## 2012-04-30 ENCOUNTER — Other Ambulatory Visit: Payer: Self-pay | Admitting: Internal Medicine

## 2012-05-23 ENCOUNTER — Encounter: Payer: BLUE CROSS/BLUE SHIELD | Admitting: Internal Medicine

## 2012-06-05 ENCOUNTER — Other Ambulatory Visit: Payer: Self-pay | Admitting: Internal Medicine

## 2012-07-04 ENCOUNTER — Other Ambulatory Visit: Payer: Self-pay | Admitting: Internal Medicine

## 2012-07-05 ENCOUNTER — Encounter: Payer: Federal, State, Local not specified - PPO | Admitting: Internal Medicine

## 2012-07-25 ENCOUNTER — Other Ambulatory Visit: Payer: Self-pay | Admitting: Internal Medicine

## 2012-08-02 ENCOUNTER — Other Ambulatory Visit: Payer: Self-pay | Admitting: Internal Medicine

## 2012-08-24 ENCOUNTER — Other Ambulatory Visit (INDEPENDENT_AMBULATORY_CARE_PROVIDER_SITE_OTHER): Payer: Federal, State, Local not specified - PPO

## 2012-08-24 ENCOUNTER — Telehealth: Payer: Self-pay | Admitting: Internal Medicine

## 2012-08-24 ENCOUNTER — Other Ambulatory Visit: Payer: Self-pay | Admitting: *Deleted

## 2012-08-24 DIAGNOSIS — Z0000 Encounter for general adult medical examination without abnormal findings: Secondary | ICD-10-CM

## 2012-08-24 LAB — URINALYSIS, ROUTINE W REFLEX MICROSCOPIC
Hgb urine dipstick: NEGATIVE
Nitrite: NEGATIVE
Total Protein, Urine: NEGATIVE
Urine Glucose: NEGATIVE
pH: 6 (ref 5.0–8.0)

## 2012-08-24 LAB — HEPATIC FUNCTION PANEL
ALT: 17 U/L (ref 0–35)
AST: 21 U/L (ref 0–37)
Albumin: 4 g/dL (ref 3.5–5.2)
Total Protein: 7.1 g/dL (ref 6.0–8.3)

## 2012-08-24 LAB — CBC WITH DIFFERENTIAL/PLATELET
Basophils Absolute: 0 10*3/uL (ref 0.0–0.1)
HCT: 41.5 % (ref 36.0–46.0)
Hemoglobin: 14.2 g/dL (ref 12.0–15.0)
Lymphs Abs: 1.9 10*3/uL (ref 0.7–4.0)
MCV: 96 fl (ref 78.0–100.0)
Monocytes Absolute: 0.7 10*3/uL (ref 0.1–1.0)
Neutro Abs: 4.3 10*3/uL (ref 1.4–7.7)
Platelets: 250 10*3/uL (ref 150.0–400.0)
RDW: 12.8 % (ref 11.5–14.6)

## 2012-08-24 LAB — LDL CHOLESTEROL, DIRECT: Direct LDL: 107.9 mg/dL

## 2012-08-24 LAB — BASIC METABOLIC PANEL
Calcium: 9.4 mg/dL (ref 8.4–10.5)
Creatinine, Ser: 0.8 mg/dL (ref 0.4–1.2)
GFR: 83.05 mL/min (ref >60.00)
Glucose, Bld: 99 mg/dL (ref 70–99)
Sodium: 137 mEq/L (ref 135–145)

## 2012-08-24 LAB — LIPID PANEL
HDL: 70.7 mg/dL (ref >39.00)
Triglycerides: 240 mg/dL — ABNORMAL HIGH (ref 0.0–149.0)

## 2012-08-24 MED ORDER — CEPHALEXIN 500 MG PO CAPS: 500.0000 mg | ORAL_CAPSULE | Freq: Four times a day (QID) | ORAL | Status: DC

## 2012-08-24 NOTE — Telephone Encounter (Signed)
Pt with abnormal ua incidental, and with dysuria after CMA/robin phoned pt this am  OK for empiric antibx - done erx

## 2012-08-28 ENCOUNTER — Ambulatory Visit (INDEPENDENT_AMBULATORY_CARE_PROVIDER_SITE_OTHER): Payer: Federal, State, Local not specified - PPO | Admitting: Internal Medicine

## 2012-08-28 ENCOUNTER — Encounter: Payer: Self-pay | Admitting: Internal Medicine

## 2012-08-28 VITALS — BP 132/90 | HR 56 | Temp 98.5°F | Ht 65.5 in | Wt 241.0 lb

## 2012-08-28 DIAGNOSIS — I10 Essential (primary) hypertension: Secondary | ICD-10-CM

## 2012-08-28 DIAGNOSIS — R0683 Snoring: Secondary | ICD-10-CM

## 2012-08-28 DIAGNOSIS — Z23 Encounter for immunization: Secondary | ICD-10-CM

## 2012-08-28 DIAGNOSIS — Z0000 Encounter for general adult medical examination without abnormal findings: Secondary | ICD-10-CM

## 2012-08-28 DIAGNOSIS — R0609 Other forms of dyspnea: Secondary | ICD-10-CM

## 2012-08-28 MED ORDER — CETIRIZINE HCL 10 MG PO TABS: ORAL_TABLET | ORAL | Status: DC

## 2012-08-28 NOTE — Assessment & Plan Note (Signed)
For ent referral per pt reqeust 

## 2012-08-28 NOTE — Assessment & Plan Note (Signed)

## 2012-08-28 NOTE — Patient Instructions (Addendum)
Your EKG was ok today You had the flu shot today Your blood work was OK today Continue all other medications as before Your Zyrtec was refilled today Please have the pharmacy call with any other refills you may need. Please continue your efforts at being more active, low cholesterol diet, and weight control You will be contacted regarding the referral for: colonoscopy Thank you for enrolling in MyChart. Please follow the instructions below to securely access your online medical record. MyChart allows you to send messages to your doctor, view your test results, renew your prescriptions, schedule appointments, and more. To Log into MyChart, please go to [email or web address], and your Username is: [email or web address]  (pass [email or web address]) You will be contacted regarding the referral for: ENT  - Dr Lovey Newcomer Please return in 1 year for your yearly visit, or sooner if needed, with Lab testing done 3-5 days before

## 2012-08-28 NOTE — Progress Notes (Signed)
Subjective:    Patient ID: Shari Navarro, female    DOB: 06-Jan-1961, 52 y.o.   MRN: [id number]  HPI Here for wellness and f/u;  Overall doing ok;  Pt denies CP, worsening SOB, DOE, wheezing, orthopnea, PND, worsening LE edema, palpitations, dizziness or syncope.  Pt denies neurological change such as new Headache, facial or extremity weakness.  Pt denies polydipsia, polyuria, or low sugar symptoms. Pt states overall good compliance with treatment and medications, good tolerability, and trying to follow lower cholesterol diet.  Pt denies worsening depressive symptoms, suicidal ideation or panic. No fever, wt loss, night sweats, loss of appetite, or other constitutional symptoms.  Pt states good ability with ADL's, low fall risk, home safety reviewed and adequate, no significant changes in hearing or vision, and occasionally active with exercise.  Feels improved with stamina,fever, and dysuria resolved with antibx.  Stopped the ERT about 6 mo ago, doing ok with hot flashes.   Has increased snoring recently with wt gain - no somnolence, asks to see ENT Past Medical History  Diagnosis Date  . Morbid obesity 03/17/2007  . DEPRESSION 03/17/2007  . HYPERTENSION 03/17/2007  . DEGENERATIVE JOINT DISEASE, RIGHT KNEE 03/17/2007  . RASH-NONVESICULAR 11/17/2009  . BREAST IMPLANTS, BILATERAL, HX OF 11/17/2009  . Hyperlipidemia 12/19/2010   Past Surgical History  Procedure Date  . Gastric bypass 2007  . Breast enhancement surgery 1981  . Jaw surgury 1980    reports that she has never smoked. She does not have any smokeless tobacco history on file. She reports that she drinks alcohol. Her drug history not on file. family history includes Cancer in her other; Diabetes in her father and mother; Heart attack in her father; Heart disease in her father and other; Hyperlipidemia in her father; and Transient ischemic attack in her other. Allergies  Allergen Reactions  . Codeine    Current Outpatient Prescriptions on  File Prior to Visit  Medication Sig Dispense Refill  . B Complex Vitamins (B COMPLEX 100 PO) Take by mouth daily.        Marland Kitchen BENICAR 20 MG tablet TAKE 1 TABLET BY MOUTH DAILY  90 tablet  3  . Calcium-Magnesium 250-125 MG TABS Take by mouth daily.        . cephALEXin (KEFLEX) 500 MG capsule Take 1 capsule (500 mg total) by mouth 4 (four) times daily.  40 capsule  0  . cetirizine (ZYRTEC) 10 MG tablet TAKE 1 TABLET BY MOUTH EVERY DAY as needed  90 tablet  3  . clonazePAM (KLONOPIN) 0.5 MG tablet Take 0.5 mg by mouth 2 (two) times daily as needed.      . Levonorgestrel (MIRENA IU) by Intrauterine route.        Marland Kitchen levothyroxine (SYNTHROID, LEVOTHROID) 112 MCG tablet TAKE 1 TABLET BY MOUTH DAILY  90 tablet  0  . metroNIDAZOLE (METROGEL) 0.75 % gel Apply topically 2 (two) times daily.        . Multiple Vitamin (MULTIVITAMIN) capsule Take 1 capsule by mouth daily.        . nefazodone (SERZONE) 200 MG tablet 1 tablet in the morning and 2 tablets in the evening       . sertraline (ZOLOFT) 50 MG tablet Take 50 mg by mouth daily.        . temazepam (RESTORIL) 30 MG capsule Take 30 mg by mouth as needed.         Review of Systems Review of Systems  Constitutional: Negative for diaphoresis,  activity change, appetite change and unexpected weight change.  HENT: Negative for hearing loss, ear pain, facial swelling, mouth sores and neck stiffness.   Eyes: Negative for pain, redness and visual disturbance.  Respiratory: Negative for shortness of breath and wheezing.   Cardiovascular: Negative for chest pain and palpitations.  Gastrointestinal: Negative for diarrhea, blood in stool, abdominal distention and rectal pain.  Genitourinary: Negative for hematuria, flank pain and decreased urine volume.  Musculoskeletal: Negative for myalgias and joint swelling.  Skin: Negative for color change and wound.  Neurological: Negative for syncope and numbness.  Hematological: Negative for adenopathy.    Psychiatric/Behavioral: Negative for hallucinations, self-injury, decreased concentration and agitation.      Objective:   Physical Exam BP 132/90  Pulse 56  Temp 98.5 F (36.9 C) (Oral)  Ht 5' 5.5" (1.664 m)  Wt 241 lb (109.317 kg)  BMI 39.49 kg/m2  SpO2 96% Physical Exam  VS noted Constitutional: Pt is oriented to person, place, and time. Appears well-developed and well-nourished.  HENT:  Head: Normocephalic and atraumatic.  Right Ear: External ear normal.  Left Ear: External ear normal.  Nose: Nose normal.  Mouth/Throat: Oropharynx is clear and moist.  Eyes: Conjunctivae and EOM are normal. Pupils are equal, round, and reactive to light.  Neck: Normal range of motion. Neck supple. No JVD present. No tracheal deviation present.  Cardiovascular: Normal rate, regular rhythm, normal heart sounds and intact distal pulses.   Pulmonary/Chest: Effort normal and breath sounds normal.  Abdominal: Soft. Bowel sounds are normal. There is no tenderness.  Musculoskeletal: Normal range of motion. Exhibits no edema.  Lymphadenopathy:  Has no cervical adenopathy.  Neurological: Pt is alert and oriented to person, place, and time. Pt has normal reflexes. No cranial nerve deficit.  Skin: Skin is warm and dry. No rash noted.  Psychiatric:  Has  normal mood and affect. Behavior is normal.     Assessment & Plan:

## 2012-08-30 ENCOUNTER — Encounter: Payer: Self-pay | Admitting: Gastroenterology

## 2012-10-30 ENCOUNTER — Other Ambulatory Visit: Payer: Self-pay | Admitting: Internal Medicine

## 2013-02-05 NOTE — Progress Notes (Signed)
Subjective:      Patient ID: Molly [name] is a 52 y.o. female.    HPI  Here to establish care. Husband works for [employer] now, and has to switch PCP. Previously seen by Dr Josetta Huddle in [place].    Dx'd with unspecified "inflammatory arthritis", well-controlled on relafen and plaquenil. Sensitive to changes. See Dr Lehman Prom in [place].    Left knee bothers her, after years of sports and ACL repair. Recently had an incident where it was jerked laterally by a pedicurist, and felt a pop.   Also reports episodes of left sided tingling in arm, face. No acute weakness, no slurred speech. Recently had Head CT 6 mo ago which was normal.  Upon further questioning, Pt has a h/o visual migraines (rainbow), though hasn't had any for awhile    Due for Pap smear and mammogram  H/o LEEP procedure in the 1990's. 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     Past Medical History   Diagnosis Date   ??? Inflammatory arthritis (HCC)      see Dr Lehman Prom   ??? History of non-Hodgkin's lymphoma 2006     treated with radiation and chemo   ??? Obesity      Past Surgical History   Procedure Laterality Date   ??? Leep       in 1990's   ??? Knee surgery       ACL and meniscus repair     Family History   Problem Relation Age of Onset   ??? Dementia Mother    ??? Thyroid Disease Mother    ??? Other Father      paraplegic after farming accident   ??? High Cholesterol Sister    ??? Cancer Sister 68     Breast CA   ??? Thyroid Disease Sister    ??? High Cholesterol Brother    ??? High Cholesterol Brother        Review of Systems   Constitutional: Negative for fever, chills, activity change, appetite change and fatigue.   Eyes: Negative for photophobia and visual disturbance.   Respiratory: Negative for cough.    Cardiovascular: Negative for chest pain.   Gastrointestinal: Negative for nausea, vomiting,  diarrhea, constipation and blood in stool.   Genitourinary: Positive for pelvic pain (left-sided). Negative for dysuria.   Musculoskeletal: Positive for arthralgias (left knee ). Negative for back pain.   Skin: Negative for rash.   Neurological: Positive for numbness. Negative for dizziness, seizures and headaches.   Psychiatric/Behavioral: Negative for confusion, sleep disturbance and decreased concentration.     Filed Vitals:    02/05/13 1409   BP: 118/90   Pulse: 88   Resp: 12       Objective:   Physical Exam   Vitals reviewed.  Constitutional: She is oriented to person, place, and time. She appears well-developed and well-nourished. No distress.   Obese     HENT:   Head: Normocephalic and atraumatic.   Right Ear: External ear normal.   Left Ear: External ear normal.   Nose: Nose normal.   Mouth/Throat: Oropharynx is clear and moist.   Eyes: Conjunctivae and EOM are normal.   Neck: Normal range of motion. Neck supple. No thyromegaly present.   Cardiovascular: Normal rate, regular rhythm and normal heart  sounds.    Varicose veins in legs bilaterally   Pulmonary/Chest: Effort normal and breath sounds normal.   Abdominal: Soft. Bowel sounds are normal.   mildly tender to deep palpation in LLQ, no rebound or guarding   Neurological: She is alert and oriented to person, place, and time. No cranial nerve deficit. She exhibits normal muscle tone.   Strength in bilateral UE 5/5 and symmtric, left inner calf and toes with decreased sensation compared to right   Skin: Skin is warm and dry. She is not diaphoretic.       Assessment:      See Below      Plan:      Left foot numbness and tingling: possibly due to left knee injury  -referred to Dr Hal Hope [facility] for eval and EMG    Pelvic pain on left:  -referred to Gyn for eval, given h/o ovarian cysts and radiation to left groin     Left sided sx's of tingling:  -could be atypical migraine sx's  -no abnormal findings on exam today  -recent Head CT was reportedly  normal  -consider return for acupuncture for balancing    Obesity:   -encouraged regular exercise, healthy diet with portion control  -discussed weight management center - Pt will consider referral    HM:  -ordered mammogram   -Adacel today  -screening bloodwork  -will need referral for colonoscopy soon; discuss at next visit    DBP slightly up today, but Pt was slightly anxious in a new office, etc...  -will watch closely at future visits

## 2013-02-06 LAB — COMPREHENSIVE METABOLIC PANEL
ALT: 15 IU/L (ref 0–32)
AST: 22 IU/L (ref 0–40)
Albumin/Globulin Ratio: 1.5 (ref 1.1–2.5)
Albumin: 4.8 g/dL (ref 3.5–5.5)
Alkaline Phosphatase: 80 IU/L (ref 39–117)
BUN/Creatinine Ratio: 15 (ref 9–23)
BUN: 11 mg/dL (ref 6–24)
CO2: 25 mmol/L (ref 18–29)
Calcium: 9.6 mg/dL (ref 8.7–10.2)
Chloride: 100 mmol/L (ref 97–108)
Creatinine: 0.71 mg/dL (ref 0.57–1.00)
GFR African American: 115 mL/min/{1.73_m2} (ref >59)
GFR Non-African American: 100 mL/min/{1.73_m2} (ref >59)
Globulin: 3.1 g/dL (ref 1.5–4.5)
Glucose: 101 mg/dL — ABNORMAL HIGH (ref 65–99)
Potassium: 4.3 mmol/L (ref 3.5–5.2)
Sodium: 142 mmol/L (ref 134–144)
Total Bilirubin: 0.4 mg/dL (ref 0.0–1.2)
Total Protein: 7.9 g/dL (ref 6.0–8.5)

## 2013-02-06 LAB — TSH, REFLEX TO T4: Thyrotropin: 2.48 u[IU]/mL (ref 0.450–4.500)

## 2013-02-06 LAB — VITAMIN D 25 HYDROXY: Vit D, 25-Hydroxy: 11.9 ng/mL — ABNORMAL LOW (ref 30.0–100.0)

## 2013-02-06 LAB — LIPID PANEL
Cholesterol, Total: 248 mg/dL — ABNORMAL HIGH (ref 100–199)
HDL: 63 mg/dL (ref >39)
LDL Calculated: 153 mg/dL — ABNORMAL HIGH (ref 0–99)
Triglycerides: 161 mg/dL — ABNORMAL HIGH (ref 0–149)
VLDL Cholesterol Calculated: 32 mg/dL (ref 5–40)

## 2013-02-06 LAB — HEMOGLOBIN A1C: Hemoglobin A1C: 5.8 % — ABNORMAL HIGH (ref 4.8–5.6)

## 2013-02-06 NOTE — Progress Notes (Signed)
Quick Note:    Lipids elevated: TC 248, TG 161, HDL good at 63, LDL 153 --> rec diet changes, weight loss, consider statin if no significant improvement  ______

## 2013-02-06 NOTE — Progress Notes (Signed)
Quick Note:    CMP wnl  Vit D low at 11--> rec supplement with Ergo  ______

## 2013-02-06 NOTE — Progress Notes (Signed)
Quick Note:    A1c 5.8 --> pre-DM  ______

## 2013-02-06 NOTE — Progress Notes (Signed)
Quick Note:    TSH wnl  ______

## 2013-02-06 NOTE — Telephone Encounter (Signed)
I called Pt back to discuss lab results, but had to leave a VM asking her to call back.    CMP wnl  A1c 5.8 --> pre-DM  Lipids elevated: TC 248, TG 161, HDL good at 63, LDL 153  --> rec diet changes, weight loss, consider statin if no significant improvement  Vit D low at 11--> rec supplement with Ergo, pended order until discussed with Pt  TSH wnl

## 2013-02-08 NOTE — Telephone Encounter (Signed)
Attempted to call patient [name]

## 2013-05-26 ENCOUNTER — Other Ambulatory Visit: Payer: Self-pay | Admitting: Internal Medicine

## 2013-05-27 LAB — HM MAMMOGRAPHY

## 2013-06-24 MED ORDER — ERGOCALCIFEROL 1.25 MG (50000 UT) PO CAPS
1.25 MG (50000 UT) | ORAL_CAPSULE | ORAL | Status: DC
Start: 2013-06-24 — End: 2013-11-29

## 2013-06-27 ENCOUNTER — Other Ambulatory Visit: Payer: Self-pay

## 2013-09-18 ENCOUNTER — Telehealth

## 2013-09-18 ENCOUNTER — Encounter

## 2013-09-18 MED ORDER — NABUMETONE 750 MG PO TABS
750 MG | ORAL_TABLET | Freq: Two times a day (BID) | ORAL | Status: DC
Start: 2013-09-18 — End: 2013-11-29

## 2013-09-18 MED ORDER — HYDROXYCHLOROQUINE SULFATE 200 MG PO TABS
200 MG | ORAL_TABLET | Freq: Every day | ORAL | Status: DC
Start: 2013-09-18 — End: 2013-11-29

## 2013-09-18 NOTE — Telephone Encounter (Signed)
Attempted to call patient lvm to return call

## 2013-09-18 NOTE — Telephone Encounter (Signed)
Placed referral to Rheum [name] per Pt request,  Refilled relafen and plaquenil until can get in with them (meds Dr [name] was previously prescribing)  Please let Pt know. Thanks    Should come back for appt sometime soon.   Did she ever get the message to take Vit D supplement?

## 2013-09-18 NOTE — Telephone Encounter (Signed)
From: Molly [name]  To: [name], MD  Sent: 09/18/2013 10:27 AM EST  Subject: Non-Urgent Medical Question    I may need to make an appointment with Dr [name] for this issue. Need Rx refill for meds from my previous non [place] Dr [name] for Relafen and Plaquenil which is on my charts. Need enough so i can get a referral arranged to a [name] physician.      I also need a referral to a new rheumatologist [name] for an appointment. She is not taking new patients but in the referral if you could put that I am [name], they will see me.    Thank you

## 2013-09-18 NOTE — Telephone Encounter (Signed)
Molly [name] is requesting a referral to a Rheumatologist.

## 2013-09-19 ENCOUNTER — Other Ambulatory Visit: Payer: Self-pay | Admitting: Internal Medicine

## 2013-09-19 NOTE — Telephone Encounter (Signed)
noted 

## 2013-09-19 NOTE — Telephone Encounter (Signed)
Referral from Dr Hart RobinsonsBridges--called pt [facility].  Consult requested for inflammatory arthritis.  Records should be in epic---she was seeing Dr [name] no longer on her ins.

## 2013-10-03 NOTE — Telephone Encounter (Signed)
Please call Pt to have her come in for bloodwork

## 2013-10-30 ENCOUNTER — Other Ambulatory Visit: Payer: Self-pay | Admitting: Internal Medicine

## 2013-11-29 MED ORDER — HYDROXYCHLOROQUINE SULFATE 200 MG PO TABS
200 MG | ORAL_TABLET | Freq: Two times a day (BID) | ORAL | Status: DC
Start: 2013-11-29 — End: 2014-01-07

## 2013-11-29 MED ORDER — NABUMETONE 750 MG PO TABS
750 MG | ORAL_TABLET | Freq: Two times a day (BID) | ORAL | Status: DC
Start: 2013-11-29 — End: 2014-01-07

## 2013-11-29 NOTE — Patient Instructions (Signed)
Please call the office 10 days following your MRI for results.    Labs, EMG, DEXA scan will be discussed at your next office visit.    Please use the compound cream as advised      Please have your eyes examined by an ophthalmologist    Please keep appointment with Dr. Caralee AtesAndrews    Increase Plaquenil and Relafen to twice daily dosing.  May decrease the Relafen if you are feeling better.

## 2013-11-30 LAB — COMPREHENSIVE METABOLIC PANEL
ALT: 15 U/L (ref 10–40)
AST: 20 U/L (ref 15–37)
Albumin/Globulin Ratio: 1.4 (ref 1.1–2.2)
Albumin: 4.9 g/dL (ref 3.4–5.0)
Alkaline Phosphatase: 98 U/L (ref 40–129)
Anion Gap: 16 (ref 3–16)
BUN: 13 mg/dL (ref 7–20)
CO2: 26 mmol/L (ref 21–32)
Calcium: 9.9 mg/dL (ref 8.3–10.6)
Chloride: 99 mmol/L (ref 99–110)
Creatinine: 0.6 mg/dL (ref 0.6–1.1)
GFR African American: >60 (ref >60)
GFR Non-African American: >60 (ref >60)
Globulin: 3.4 g/dL
Glucose: 93 mg/dL (ref 70–99)
Potassium: 4.5 mmol/L (ref 3.5–5.1)
Sodium: 141 mmol/L (ref 136–145)
Total Bilirubin: 0.4 mg/dL (ref 0.0–1.0)
Total Protein: 8.3 g/dL — ABNORMAL HIGH (ref 6.4–8.2)

## 2013-11-30 LAB — CBC WITH AUTO DIFFERENTIAL
Basophils %: 0.4 %
Basophils Absolute: 0 10*3/uL (ref 0.0–0.2)
Eosinophils %: 3.5 %
Eosinophils Absolute: 0.3 10*3/uL (ref 0.0–0.6)
Hematocrit: 43.1 % (ref 36.0–48.0)
Hemoglobin: 14.4 g/dL (ref 12.0–16.0)
Lymphocytes %: 28.9 %
Lymphocytes Absolute: 2.2 10*3/uL (ref 1.0–5.1)
MCH: 32.4 pg (ref 26.0–34.0)
MCHC: 33.5 g/dL (ref 31.0–36.0)
MCV: 96.9 fL (ref 80.0–100.0)
MPV: 7.8 fL (ref 5.0–10.5)
Monocytes %: 11.9 %
Monocytes Absolute: 0.9 10*3/uL (ref 0.0–1.3)
Neutrophils %: 55.3 %
Neutrophils Absolute: 4.2 10*3/uL (ref 1.7–7.7)
Platelets: 342 10*3/uL (ref 135–450)
RBC: 4.45 M/uL (ref 4.00–5.20)
RDW: 12.6 % (ref 12.4–15.4)
WBC: 7.6 10*3/uL (ref 4.0–11.0)

## 2013-11-30 LAB — URIC ACID: Uric Acid, Serum: 5.5 mg/dL (ref 2.6–6.0)

## 2013-11-30 LAB — URINALYSIS
Bilirubin Urine: NEGATIVE
Blood, Urine: NEGATIVE
Glucose, Ur: NEGATIVE mg/dL
Ketones, Urine: NEGATIVE mg/dL
Leukocyte Esterase, Urine: NEGATIVE
Nitrite, Urine: NEGATIVE
Protein, UA: NEGATIVE mg/dL
Specific Gravity, UA: 1.008 (ref 1.005–1.030)
Urobilinogen, Urine: 0.2 E.U./dL (ref <2.0)
pH, UA: 6.5 (ref 5.0–8.0)

## 2013-11-30 LAB — VITAMIN D 25 HYDROXY: Vit D, 25-Hydroxy: 14.2 ng/mL — ABNORMAL LOW (ref >=30)

## 2013-11-30 LAB — C4 COMPLEMENT: C4 Complement: 28.3 mg/dL (ref 10.0–40.0)

## 2013-11-30 LAB — RHEUMATOID FACTOR: Rheumatoid Factor: 15 IU/mL — ABNORMAL HIGH (ref <14)

## 2013-11-30 LAB — C3 COMPLEMENT: C3 Complement: 153.4 mg/dL (ref 90.0–180.0)

## 2013-11-30 LAB — SEDIMENTATION RATE: Sed Rate: 22 mm/Hr (ref 0–30)

## 2013-11-30 LAB — CK: Total CK: 106 U/L (ref 26–192)

## 2013-11-30 LAB — C-REACTIVE PROTEIN: CRP: 5.1 mg/L (ref 0.0–5.1)

## 2013-12-02 LAB — ANTI-CENTROMERE AB IGG: Centromere Ab IgG: 1 AU/mL (ref 0–40)

## 2013-12-02 LAB — ENA 2 - ANTI SSA & ANTI SSB
ENA SSA (RO) Ab: POSITIVE EU — AB
ENA SSB (LA) Ab: NEGATIVE EU

## 2013-12-02 LAB — ENA 1 - ANTI SMITH & ANTI RNP
Anti-RNP: NEGATIVE EU
ENA Smith (SM) Ab: NEGATIVE EU

## 2013-12-02 LAB — ANTI-DNA AB, DOUBLE-STRANDED IGG TITER: dsDNA Ab Titer: 1:160 {titer} — ABNORMAL HIGH

## 2013-12-02 LAB — CYCLIC CITRUL PEPTIDE ANTIBODY, IGG: CCP IgG Antibodies: 4 Units (ref 0–19)

## 2013-12-02 LAB — ANTI-DNA ANTIBODY, DOUBLE-STRANDED: Double Stranded Dna Ab, Igg: DETECTED — AB

## 2013-12-02 NOTE — Progress Notes (Signed)
[facility]  [facility] [place] [facility]  Rheumatology Progress Note  Harley HallmarkSoha Alexandria Shiflett, MD        Reason for consultation:    HISTORY OF PRESENT ILLNESS:                The patient is a 53 y.o. female is referred by Dr.Dunsieth   to establish care with a rheumatologist having a diagnosis of inflammatory arthritis-most likely systemic lupus having been diagnosed and followed by multiple rheumatologists over the years.  She states that she was initially diagnosed in the 1990s when she developed the acute onset of severe pain, bilateral elbow pain that eventually involved her hands, shoulders, knees ankles and feet.  She was tried throughout the years on multiple anti-inflammatories including Daypro, Naprosyn, ibuprofen and Tylenol.  Laboratory studies that she's had indicated that she has a positive ANA, positive SSA antibody with a negative SSB antibody.  Prior labs have indicated that RNP, Smith, and double-stranded DNA along with rheumatoid factor titer have been negative.  She has been maintained on Plaquenil 200 mg twice a day and Relafen 750 mg twice a day now for many years.  She is tolerating her medications well without side effects or difficulties.  She has never been tried on any DMARD medication or Benlysta. Intermittently she has more involvement of her pain and swelling of her joints.  Sleep has been stable.  She has morning stiffness lasting one hour daily.  She denies having any sicca symptoms, photosensitivity, Raynaud's, miscarriages, DVTs, pulmonary embolisms.  Family history is unremarkable for any autoimmune illness.  She has been seen and followed by Drs. Leodis LiverpoolFritz and [place].  X-rays that were done by [name] March 2013 indicated that she has significant narrowing and spurring at L2-L3 and at L5-S1 with no narrowing of the hip joints.  Currently she continues to have ongoing numbness, burning, tingling sensations involving her upper and lower extremities.  She denies ever having  had an EMG study done for follow-up.  She had been following up with an orthopedic surgeon regarding her left knee.  Chronically she's had much difficulty involving that left knee.  Total knee replacement is a consideration for the future.  The left knee currently walks on her at times and gives out.  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  She denies any fractures and has not had a DEXA scan done.    Allergies:    Sulfa antibiotics  Medications:  Prior to Admission medications    Medication Sig Start Date End Date Taking? Authorizing Provider   Cholecalciferol (VITAMIN D3) 2000 UNITS CAPS Take  by mouth.   Yes Historical Provider, MD   nabumetone (RELAFEN) 750 MG tablet Take 1 tablet by mouth 2 times daily. 11/29/13  Yes Harley HallmarkSoha Truitt Cruey, MD   hydroxychloroquine (PLAQUENIL) 200 MG tablet Take 1 tablet by mouth 2 times daily. 11/29/13  Yes Harley HallmarkSoha Havanah Nelms, MD       Past Medical History   Diagnosis Date   ??? Inflammatory arthritis (HCC)      see Dr [name]   ??? History of non-Hodgkin's lymphoma 2006     treated with radiation and chemo   ??? Obesity       Past Surgical History   Procedure Laterality Date   ??? Leep       in 1990's   ??? Knee surgery       ACL and meniscus  repair     Family History   Problem Relation Age of Onset   ??? Dementia Mother    ??? Thyroid Disease Mother    ??? Other Father      paraplegic after farming accident   ??? High Cholesterol Sister    ??? Cancer Sister [phone]     Breast CA   ??? Thyroid Disease Sister    ??? High Cholesterol Brother    ??? High Cholesterol Brother      History   Substance Use Topics   ??? Smoking status: Former Smoker -- 0.50 packs/day for 18 years   ??? Smokeless tobacco: Not on file      Comment: quit 8 years ago   ??? Alcohol Use: 0.6 oz/week     1 Shots of liquor per week      Comment: 2x/week       REVIEW OF SYSTEMS:    Constitutional: No unanticipated weight loss  intermittent low-grade fevers.  Positive fatigue and  malaise.  Recent 20 pound weight gain  Integumentary: No rash, photosensitivity, livedo reticularis, alopecia and Raynaud's symptoms.  Eyes: negative for  dry eyes, visual disturbance and persistent redness; blurred vision  ENT:  - No tinnitus, loss of hearing, vertigo, or recurrent ear infections.  - No history of nasal/oral ulcers.  - No history of sicca symptoms.  Cardiovascular: No history of pericarditis, chest pain or murmur or palpitations  Respiratory: No shortness of breath, cough or history of interstitial lung disease.No history of pleurisy. No history of tuberculosis or atypical infections.  Gastrointestinal: No history of dysphagia or esophageal dysmotility. No change in bowel habits or any inflammatory bowel disease.  Genitourinary:  No history renal disease,miscarriages.  Hematologic/Lymphatic: No abnormal bruising or bleeding, blood clots or swollen lymph nodes.  Musculoskeletal:  Left knee pain, locking giving out  Neurological: Numbness, burning, tingling sensations involving left greater than right lower extremity but also upper extremity  Psychiatric: There is no history of depression, suicidal ideation or significant anxiety.  Endocrine:  Denies any thyroid / parathyroid disease and osteoporosis  Allergic/Immunologic: No nasal congestion or hives.      PHYSICAL EXAM:      Vitals:    BP 134/103    Pulse 92    Wt 297 lb (134.718 kg)  Body mass index is 43.25 kg/(m^2).      General appearance: alert, appears stated age and cooperative. Normal gait and posture. No evidence of overt muscular wasting.  Overweight    MKS:  Full range of motion of the cervical neck, bilateral shoulders, elbows, wrists, hands.  Able to make a complete fist bilaterally.  No evidence of intraosseous muscle wasting, synovitis, swelling or warmth or tenderness.  Pinch intact.  Left knee with evidence of mild reactive synovitis especially involving the lateral border with a valgus deformity.  No swelling or tenderness  involving the right knee.  No tenderness or swelling involving the subacromial bursa, or anserine bursa.   No evidence of swelling, warmth or tenderness involving bilateral ankles or feet.  Metatarsal squeeze nontender.    -Spine:  Full range of motion of the axial spine  mild L4-5 paraspinal muscle tenderness to light tenderness  There are 12/18  tender fibromyalgia points.  Strength is  5/5  in both upper and lower extremities.    Skin: No abnormal rash. Palpation of the skin and subcutaneous tissues reveals no evidence of induration, subcutaneous nodule or tenderness.  Eyes: Normal conjunctiva and lids normal. Irises and  Pupils normal.  HEENT: External inspection of the ears and nose within normal limits. No oral or nasal ulcers. Lips and gums show no evidence of abnormality.   Examination of the oral cavity, oropharynx including the salivary glands reveals no evidence of abnormality. Good salivary pool.  Neck: No masses or thyroid enlargement.  Lungs: normal respiration effort, clear to auscultation bilaterally.  Heart:  regular rate ;  No edema in the lower extremities.   Abdomen: soft, non-tender; bowel sounds normal  Neurologic: Normal muscle strength in upper and lower extremities, proximally and distally.Deep tendon reflexes preserved, and touch sensation is grossly preserved.  Psychiatric: Normal judgment and insight.Orientated to time, place, and person.  Lymphatics: Palpation of the lymph nodes in the neck  and supraclavicular area reveals no abnormal masses or adenopathy.      DATA:   Outside data reviewed and in HPI    Lab Results   Component Value Date    WBC 7.6 11/29/2013    HGB 14.4 11/29/2013    HCT 43.1 11/29/2013    MCV 96.9 11/29/2013    PLT 342 11/29/2013     Lab Results   Component Value Date    WBC 7.6 11/29/2013    RBC 4.45 11/29/2013    HGB 14.4 11/29/2013    HCT 43.1 11/29/2013    PLT 342 11/29/2013    MCV 96.9 11/29/2013    MCH 32.4 11/29/2013    MCHC 33.5 11/29/2013    RDW 12.6 11/29/2013     LYMPHOPCT 28.9 11/29/2013    MONOPCT 11.9 11/29/2013    BASOPCT 0.4 11/29/2013    MONOSABS 0.9 11/29/2013    LYMPHSABS 2.2 11/29/2013    EOSABS 0.3 11/29/2013    BASOSABS 0.0 11/29/2013       Chemistry        Component Value Date/Time    NA 141 11/29/2013 2244    K 4.5 11/29/2013 2244    CL 99 11/29/2013 2244    CO2 26 11/29/2013 2244    BUN 13 11/29/2013 2244    CREATININE 0.6 11/29/2013 2244        Component Value Date/Time    CALCIUM 9.9 11/29/2013 2244    ALKPHOS 98 11/29/2013 2244    AST 20 11/29/2013 2244    ALT 15 11/29/2013 2244    BILITOT 0.4 11/29/2013 2244          Lab Results   Component Value Date    SEDRATE 22 11/29/2013     Lab Results   Component Value Date    C3 153.4 11/29/2013    C4 28.3 11/29/2013         Radiology:    Outside records reviewed and in the computer.    Assessment:    Assessment/Plan:  Druscilla was seen today for established new doctor.    Diagnoses and associated orders for this visit:    Inflammatory arthritis (HCC)- seems to be more consistent with systemic lupus having had a diagnosis of elevated ANA and elevated SSA with a negative SSB.  However no prior history of sicca symptoms, nasal or oral ulcers, alopecia, photosensitivity, or internal organ involvement.  Has done well overall on Plaquenil.  Has never required the use of any DMARD medication.  Can consider further treatment if needed depending on workup possibly IV Benlysta or low doses of methotrexate.  Further evaluation is indicated.    - ANA  - CBC Auto Differential  - Comprehensive Metabolic Panel  - CK  -  C-Reactive Protein  - Urinalysis  - Sedimentation Rate  - Rheumatoid Factor  - Cyclic Citrul Peptide Antibody, IgG  - Vitamin D 25 Hydroxy  - Uric Acid  - Anti-Centromere AB IgG  - Anti-DNA Antibody, Double-Stranded  - C3 Complement  - C4 Complement  - Ena 2 - anti SSA & anti SSB  - Ena 1 - anti smith & anti RNP  - MRI Knee Left WO Contrast  - DEXA Bone Density Axial  - PR NEEDLE EMG EA EXTREMTY W/PARASPINL AREA COMPLETE  - nabumetone  (RELAFEN) 750 MG tablet; Take 1 tablet by mouth 2 times daily.  - hydroxychloroquine (PLAQUENIL) 200 MG tablet; Take 1 tablet by mouth 2 times daily.  - Patient advised and names given for her to establish care with ophthalmology regarding annual eye examination for evaluation of Plaquenil toxicity/retinopathy    Osteoarthritis of the left knee-because of insurance change can no longer keep appointment with her prior orthopedic surgeon  - Appointment made for her to establish care with a new orthopedic surgeon.  In the past has received Synvisc injections and corticosteroid injections.  Has had prior ACL tear repair.  - Compound cream prescribed.Risks, side effects and warning signs were fully discussed with patient.  Reading information was given to patient to review.  - MRI of the left knee will be done for further evaluation and possibility of internal derangement    Neuropathy- possible radiculopathy versus peripheral neuropathy.  Further evaluation is needed.  - EMG of bilateral upper and lower extremities will be done for further evaluation    Postmenopausal- now for many years since her chemotherapy that she received for her lymphoma.  Has not had a DEXA scan done.  Risk factors include mother having had a fractured left hip, patient prior smoking history and drinking history.  - DEXA scan to be done for further evaluation  - Vitamin D 25-hydroxy will be done for further evaluation    Return visit for follow-up in 2-3 months or sooner if needed      Harley Hallmark, MD, 12/02/2013 12:07 PM     [name]  [facility]  [address]  [place], [place] [address]  [phone]  Fax:[phone]    NOTE: This report is transcribed by using voice recognition software dragon. Every effort is made to ensure accuracy; however, inadvertent computerized transcription errors may be present.

## 2013-12-03 LAB — ANA
ANA Titer: 1:640 {titer}
ANA: POSITIVE — AB

## 2013-12-04 NOTE — Progress Notes (Signed)
Subjective:      Patient ID: Molly [name] is a 53 y.o. female.    HPI Comments: Patient is here for annual. Patient with some occasional pelvic pain female. Patient with hx non-Hodgkin's Lymphoma.    Gynecologic Exam  The patient's primary symptoms include pelvic pain.       Review of Systems   Constitutional: Positive for fatigue.   HENT: Negative.    Eyes: Negative.    Respiratory: Negative.    Cardiovascular: Negative.    Gastrointestinal: Negative.    Genitourinary: Positive for pelvic pain.   Musculoskeletal: Negative.    Skin: Negative.    Neurological: Negative.    Psychiatric/Behavioral: Negative.      Date of Birth December 17, 1960  Past Medical History   Diagnosis Date   ??? Inflammatory arthritis (HCC)      see Dr [name]   ??? History of non-Hodgkin's lymphoma 2006     treated with radiation and chemo   ??? Obesity    ??? Diabetes mellitus (HCC)    ??? Cancer (HCC)    ??? Amenorrhea      Past Surgical History   Procedure Laterality Date   ??? Leep       in 1990's   ??? Knee surgery       ACL and meniscus repair     OB History   Gravida Para Term Preterm AB SAB TAB Ectopic Multiple Living   0 0 0 0 0 0 0 0 0 0            History     Social History   ??? Marital Status: Married     Spouse Name: N/A     Number of Children: N/A   ??? Years of Education: N/A     Occupational History   ??? Not on file.     Social History Main Topics   ??? Smoking status: Former Smoker -- 0.50 packs/day for 18 years   ??? Smokeless tobacco: Never Used      Comment: quit 8 years ago   ??? Alcohol Use: 0.6 oz/week     1 Shots of liquor per week      Comment: 2x/week   ??? Drug Use: No   ??? Sexual Activity: Not Currently     Other Topics Concern   ??? Not on file     Social History Narrative     Allergies   Allergen Reactions   ??? Sulfa Antibiotics Rash     Outpatient Prescriptions Marked as Taking for the 12/02/13 encounter (Office Visit) with Estill Battenaroline J Daphyne Miguez, MD   Medication Sig Dispense Refill   ??? Cholecalciferol (VITAMIN D3) 2000 UNITS CAPS Take  by mouth.       ???  nabumetone (RELAFEN) 750 MG tablet Take 1 tablet by mouth 2 times daily.  60 tablet  5   ??? hydroxychloroquine (PLAQUENIL) 200 MG tablet Take 1 tablet by mouth 2 times daily.  60 tablet  4     Family History   Problem Relation Age of Onset   ??? Dementia Mother    ??? Thyroid Disease Mother    ??? Other Father      paraplegic after farming accident   ??? High Cholesterol Sister    ??? Cancer Sister 350     Breast CA   ??? Thyroid Disease Sister    ??? High Cholesterol Brother    ??? High Cholesterol Brother    ??? Rheum Arthritis Neg Hx    ???  Osteoarthritis Neg Hx    ??? Asthma Neg Hx    ??? Breast Cancer Neg Hx    ??? Diabetes Neg Hx    ??? Heart Failure Neg Hx    ??? Hypertension Neg Hx    ??? Migraines Neg Hx    ??? Ovarian Cancer Neg Hx    ??? Rashes/Skin Problems Neg Hx    ??? Seizures Neg Hx    ??? Stroke Neg Hx      BP 126/92    Pulse 76    Temp(Src) 98.1 ??F (36.7 ??C) (Oral)    Resp 17    Ht 5\' 10"  (1.778 m)    Wt 297 lb (134.718 kg)    BMI 42.61 kg/m2      Breastfeeding? No         Objective:   Physical Exam   Constitutional: She is oriented to person, place, and time. She appears well-developed and well-nourished. No distress.   HENT:   Head: Normocephalic and atraumatic.   Eyes: EOM are normal. Pupils are equal, round, and reactive to light.   Neck: Normal range of motion. Neck supple. No thyromegaly present.   Cardiovascular: Normal rate, regular rhythm and normal heart sounds.  Exam reveals no gallop and no friction rub.    No murmur heard.  Pulmonary/Chest: Effort normal and breath sounds normal. No respiratory distress. She has no wheezes. She has no rales.   Abdominal: Soft. She exhibits no distension and no mass. There is no hepatomegaly. There is no tenderness. There is no rebound and no guarding. No hernia.   Genitourinary: Vagina normal and uterus normal. Rectal exam shows no external hemorrhoid and no fissure. No breast swelling, tenderness, discharge or bleeding. Pelvic exam was performed with patient supine. No labial fusion. There is  no rash, tenderness, no lesion or injury on the right labia. There is no rash, tenderness, no lesion or injury on the left labia. Uterus is not deviated, not enlarged, not fixed and not tender. Cervix exhibits no motion tenderness, no discharge and no friability. Right adnexum displays no mass, no tenderness and no fullness. Left adnexum displays no mass, no tenderness and no fullness. No erythema, tenderness or bleeding in the vagina. No foreign body around the vagina. No signs of injury around the vagina. No vaginal discharge found.   Normal urethral meatus, nl urethra, nl bladder.     Musculoskeletal: Normal range of motion.   Lymphadenopathy:        Right: No inguinal adenopathy present.        Left: No inguinal adenopathy present.   Neurological: She is alert and oriented to person, place, and time. She has normal reflexes.   Skin: Skin is warm and dry.   Psychiatric: She has a normal mood and affect. Her behavior is normal. Judgment and thought content normal.       Assessment:      1. Annual  2. Pelvic pain female  3. Non-Hodgkin's Lymphoma   4. menopause      Plan:      1. Pap, calcium, exercise, mammogram  2 and 3. Check CT scan of abdomen and pelvis given pain and hx cancer.    4. stable

## 2013-12-09 ENCOUNTER — Encounter

## 2013-12-16 ENCOUNTER — Encounter

## 2013-12-16 MED ADMIN — ioversol (OPTIRAY) 74 % injection 100 mL: 100 mL | INTRAVENOUS | @ 19:00:00 | NDC 00019133361

## 2013-12-16 MED ADMIN — iohexol (OMNIPAQUE 240) injection 50 mL: 50 mL | ORAL | @ 18:00:00 | NDC 00407141230

## 2013-12-16 MED FILL — OMNIPAQUE 240 MG/ML IJ SOLN: 240 MG/ML | INTRAMUSCULAR | Qty: 50

## 2014-01-03 ENCOUNTER — Encounter

## 2014-01-06 ENCOUNTER — Encounter

## 2014-01-07 MED ORDER — NABUMETONE 750 MG PO TABS
750 MG | ORAL_TABLET | Freq: Two times a day (BID) | ORAL | Status: DC
Start: 2014-01-07 — End: 2014-09-23

## 2014-01-07 MED ORDER — HYDROXYCHLOROQUINE SULFATE 200 MG PO TABS
200 MG | ORAL_TABLET | Freq: Two times a day (BID) | ORAL | Status: DC
Start: 2014-01-07 — End: 2014-04-22

## 2014-01-07 NOTE — Telephone Encounter (Signed)
From: Molly KetoBeth A Knezevic  To: Harley HallmarkSoha Mousa, MD  Sent: 01/06/2014 5:31 PM EDT  Subject: Medication Renewal Request    Original authorizing provider: Harley HallmarkSoha Mousa, MD    Molly [name] would like a refill of the following medications:  nabumetone (RELAFEN) 750 MG tablet Harley Hallmark[Soha Mousa, MD]  hydroxychloroquine (PLAQUENIL) 200 MG tablet Harley Hallmark[Soha Mousa, MD]    Preferred pharmacy: [facility] [phone]     Comment:  I was just informed I must do mail order refills on these medications. They suggested 90 day supply for refills and billing purposes. Pharmacy Contact # is [phone]. Info required is: Molly [name] DOB [birth year] Member # [id number] If there is a problem, please let me know. Thank you

## 2014-02-07 MED ORDER — DICLOFENAC SODIUM 1 % TD GEL
1 % | TRANSDERMAL | Status: DC
Start: 2014-02-07 — End: 2015-01-27

## 2014-02-07 NOTE — Progress Notes (Signed)
Molly Register, MD  Orthopaedic Surgery & Sports Medicine  Knee & Shoulder Specialist    []   [place] OFFICE    [x]   [facility] OFFICE  [address]                  [address]  [place], [place]  [address]                     [place], [place]  [address]  [phone]                     [phone]  [phone] (fax)    [phone] (fax)      PATIENT: Molly [name]    53 y.o.  female  DATE OF BIRTH: 03-08-1961   MRN:  [id number]       Date of current encounter: 02/07/2014  This encounter is evaluated as a:       [x]  New Patient Visit    []  Established Patient Visit   []  Post-Op Visit     [x]  Consult: requested by Dr. Sullivan Lone         []  Worker's Comp       Patient's PCP is Dr. Merla Riches, MD    Subjective:     Chief Complaint   Patient presents with   ??? Knee Pain     left knee pain,  ACL surgery 1991 by Dr. Henrene Hawking       HPI:  Molly [name] is a 53 y.o. year old, Caucasian female complaining of left knee pain. She has had the problem since 1991 after a skiing accident.  Her left knee starting bothering her again 6 months ago.    PAIN ASSESSMENT:   Molly [name] pain/problem was the result of an injury and was not work related.  She has a pain level on 0/10 scale:  8 out of 10 with activity and 2 out of 10 at rest. Her pain is intermittent and occurs daily.  When the pain occurs, it lasts until she rest .  She reports that NSAID's, rest makes her pain better and standing, walking and working all day makes her pain worse. She denies pain at night.  She has numbness and tingling patient states that she has numbness since the ACL surgery.  She describes her pain as sharp.  Her pain is located at the diffuse aspect of her left knee.  Her pain does not radiate.  She feels that the problem is gradually worsening.  The pain is limiting her behavior.  She can walk a city block without pain however she cannot stand 15 minutes without pain.  It is affecting her right hip.  She has seen Dr. Maisie Fus who has given  her Synvisc injections as well as cortisone in the past.  She's had the Synvisc 1 as well as the Synvisc 3 injections and the Synvisc 3 works better for her.    Patient Active Problem List   Diagnosis   ??? H/O vitamin D deficiency   ??? History of non-Hodgkin's lymphoma   ??? Inflammatory arthritis (HCC)   ??? Obesity   ??? Impaired glucose tolerance   ??? HLD (hyperlipidemia)   ??? Vitamin D deficiency   ??? S/P left ACL reconstruction 1991 by Dr. Henrene Hawking   ??? Chondromalacia patellae of left knee   ??? Tear of lateral meniscus of left knee   ??? Tear of medial meniscus of left knee   ??? Left knee DJD   ???  Morbid obesity with BMI of 40.0-44.9, adult (HCC)   ??? Acquired valgus deformity knee     Past Medical History   Diagnosis Date   ??? Inflammatory arthritis (HCC)      see Dr [name]   ??? History of non-Hodgkin's lymphoma 2006     treated with radiation and chemo   ??? Obesity    ??? Diabetes mellitus (HCC)    ??? Cancer (HCC)    ??? Amenorrhea      Past Surgical History   Procedure Laterality Date   ??? Leep       in 1990's   ??? Knee surgery       ACL and meniscus repair       Allergies:  Sulfa antibiotics    Medications:  Outpatient Prescriptions Marked as Taking for the 02/07/14 encounter (Office Visit) with Molly RegisterMichelle Jeffory Snelgrove, MD   Medication Sig Dispense Refill   ??? diclofenac sodium (VOLTAREN) 1 % GEL Below the waist apply 4 gram 4 times a day  500 g  2     History     Social History   ??? Marital Status: Married     Spouse Name: N/A     Number of Children: N/A   ??? Years of Education: N/A     Occupational History   ??? Not on file.     Social History Main Topics   ??? Smoking status: Former Smoker -- 0.50 packs/day for 18 years   ??? Smokeless tobacco: Never Used      Comment: quit 8 years ago   ??? Alcohol Use: 0.6 oz/week     1 Shots of liquor per week      Comment: 2x/week   ??? Drug Use: No   ??? Sexual Activity: Not Currently     Other Topics Concern   ??? Not on file     Social History Narrative     Family History   Problem Relation Age of Onset   ??? Dementia  Mother    ??? Thyroid Disease Mother    ??? Other Father      paraplegic after farming accident   ??? High Cholesterol Sister    ??? Cancer Sister 250     Breast CA   ??? Thyroid Disease Sister    ??? High Cholesterol Brother    ??? High Cholesterol Brother    ??? Rheum Arthritis Neg Hx    ??? Osteoarthritis Neg Hx    ??? Asthma Neg Hx    ??? Breast Cancer Neg Hx    ??? Diabetes Neg Hx    ??? Heart Failure Neg Hx    ??? Hypertension Neg Hx    ??? Migraines Neg Hx    ??? Ovarian Cancer Neg Hx    ??? Rashes/Skin Problems Neg Hx    ??? Seizures Neg Hx    ??? Stroke Neg Hx        Review of Systems:    Molly [name] reported review of systems has been reviewed and has been scanned into her medical record for today's visit.  The scanned image can be found in media images folder.  She was instructed to contact her primary care physician regarding ROS positives if not already being addressed during today's visit.    Objective:   Physical Exam  Vital Signs:  BP 149/82    Pulse 97    Ht 5\' 10"  (1.778 m)    Wt 290 lb (131.543 kg)    BMI 41.61  kg/m2       Constitution:  Generally, Molly [name] is [x]  alert, [x]  appears stated age, and [x]  in no distress.  Her general body habitus is []  Cachectic  []  Thin  []  Normal  []  Obese  [x]  Morbidly Obese    Head: [x]  Normocephalic  Eyes: [x]  Extra-occular muscles intact  Left Ear: [x]  External Ear normal   Right Ear: [x]  External Ear normal   Nose: [x]  Normal  Mouth: [x]  Oral mucosa moist  [x]  No perioral lesions    Pulmonary: [x]  Respirations unlabored and regular  Skin: [x]  Warm [x]  Well perfused     Psychiatric:   [x]  Good judgement and insight  [x]  Oriented to [x]  person, [x]  place, and [x]  time  [x]  Mood appropriate for circumstances    Gait:   Gait is []  Normal  [x]  Impaired limping  Assistive Device: [x]  None  []  Knee Brace  []  Cane  []  Crutches   []  Walker   []  Wheelchair  []  Other    ORTHOPAEDIC KNEE EXAM: LEFT    Inspection:  [x]  Skin intact without abrasion or lacerations.  []  Ecchymosis:  [x]  none  []  mild  []   moderate  []  severe  []  Atrophy:  [x]  none  []  mild  []  moderate  []  severe  []  Effusion: [x]  none  []  mild  []  moderate  []  severe  [x]  Scar / Surgical incision(s): [x]  A-Scope Portals  [x]  Open Surgical Incision(s)    Alignment:  []  Normal  []  Varus [x]  Valgus    Range of Motion:  []  Normal Knee ROM         []  Deferred: acute injury/post-surgery/pain   [x]  Limited ROM: 0-100 degrees with crepitation    Palpation:   []  No Tenderness  [x]  Tenderness: Medial and lateral []  mild  [x]  moderate  []  severe   [x]  Patellofemoral Crepitation:  []  none  []  mild  []  moderate  [x]  severe  []  Baker's Cyst:  [x]  none  []  small  []  medium  []  large    Provocative Tests:   [x]  Negative: Meniscal testing, instability testing, patella apprehension  Positive Tests:   Meniscus Testing:   []  Medial McMurray's Test (MMT)   []  Lateral McMurray's Test (LMT)        Instability Testing:   []  Lachman (ACL)   []  Posterior Drawer (PCL)   []  Valgus Stress in Extension (MCL)   []  Valgus Stress in 30?? of Flexion (MCL)   []  Varus Stress Test (LCL)       []  Patella Apprehension   []  General Ligament Laxity     Provocative Tests: BILATERAL HIP(S)  [x]  Negative logroll, straight leg raise  Positive Tests:  []  Log Roll   []  Ober Test: ITB Tightness   [x]  Hamstrings Tightness   []  Straight Leg Raise     Motor Function:   [x]  No gross motor weakness  []  Motor weakness:  []  mild  []  moderate  []  severe   Neurologic:  [x]  Sensation to light touch intact   [x]  Coordination / proprioception intact    Circulation:  [x]  The limb is warm and well perfused  []  Distal pulses intact (LE)  [x]  Capillary refill is intact.  []  Edema  [x]  none  []  mild  []  moderate  []  severe  []  Venous stasis changes  [x]  none  []  mild  []  moderate  []  severe    Contralateral Knee:  [x]  Normal ROM with no pain.  Data Reviewed:     XRays:  (3 views: AP, lateral and patella views) of her left knee taken today 02/07/2014 in the office and reviewed by me personally showed severe  degenerative changes in the patellofemoral compartment, severe degenerative changes in the medial compartment and severe (bone-on-bone) degenerative changes in the lateral compartment.  There is severe joint space narrowing and osteophyte formation in the lateral compartment and the patellofemoral compartment as well as medial compartment.  There is valgus alignment.  There are retained staples: Two in the distal lateral femur, one in the proximal medial tibia consistent with a previous anterior cruciate ligament reconstruction with visible tunnels.    MRI:  The MRI of her left knee dated 12/09/2013 shows high-grade chondromalacia patella with tiny subchondral ulceration at the patellar apex.  Large joint effusion, complex nature, likely associated with synovial hyperplasia.  Advanced degenerative osteoarthritis in the medial and lateral joint space compartments.  Horizontal tear posterior medial meniscal horn.  Maceration of the anterior lateral meniscus horn with horizontal tear within the meniscal remnant.  Intact anterior cruciate ligament graft in isometric position.  Popliteal tendon insertion strain or low-grade partial-thickness tear.    Assessment:     Molly BEJARANO is a 53 y.o. year old female with the following diagnosis and treatments ordered today:    1. Left knee DJD  Ambulatory referral to Physical Therapy    SYNVISC INJ (For Auth/Precert)    PR ARTHROCENTESIS ASPIR&/INJ MAJOR JT/BURSA W/O [place]    PR METHYLPREDNISOLONE 40 MG INJ   2. Left knee pain  XR Knee Left Standard    Ambulatory referral to Physical Therapy   3. Tear of medial meniscus of left knee  Ambulatory referral to Physical Therapy    PR ARTHROCENTESIS ASPIR&/INJ MAJOR JT/BURSA W/O [place]    PR METHYLPREDNISOLONE 40 MG INJ   4. Tear of lateral meniscus of left knee  Ambulatory referral to Physical Therapy    PR ARTHROCENTESIS ASPIR&/INJ MAJOR JT/BURSA W/O [place]    PR METHYLPREDNISOLONE 40 MG INJ   5. Chondromalacia patellae of left knee  Ambulatory  referral to Physical Therapy    PR ARTHROCENTESIS ASPIR&/INJ MAJOR JT/BURSA W/O [place]    PR METHYLPREDNISOLONE 40 MG INJ   6. S/P left ACL reconstruction 1991 by Dr. Henrene Hawking  Ambulatory referral to Physical Therapy   7. Morbid obesity with BMI of 40.0-44.9, adult [facility])  Ambulatory referral to Physical Therapy    [name]   8. Acquired valgus deformity knee, left         Her overall course: Gradually worsening despite conservative treatment    Plan:      I discussed the diagnosis and the treatment options with Molly [name] today.   In Summary:  1). The various treatment options were outlined and discussed with Molly [name] including:      [x]  Conservative care options:      physical therapy, ice, NSAID's, bracing, and activity modification       [x]  The indications for therapeutic injections: Steroids and Hyluronic Acid/Synvisc/Eufflexxa/Supartz      [x]  The indications for additional imaging/laboratory studies      [x]  The indications for (possible future) operative intervention: Arthroscopic surgery and total knee replacement in her lifetime    2). After considering the various options discussed, Molly [name] elected to pursue a course of treatment that includes the following:      [x]  MEDICATIONS/REFILLS prescribed today are listed below.  Voltaren gel and Synvisc 3 injections.  She may continue her Relafen    [x]  Physical Therapy/HEP formal physical therapy for her left knee     [x]  Script: 2 x per week x 4 weeks    [x]  Ice to affected area: 15-20 minutes 4 x day    [x]  Over the Counter/Suppliment Tx     Drinking 6-8 oz of Tart Cherry Juice     Vit D 3 (at least 2,000 units/day)    [x]  DME's: Knee brace is optional, comfortable shoes are suggested    [x]  Knee osteoarthritis compounded by obesity. Molly [name] was counseled with regard to the importance and benefits of weight loss.    [x]   I have referred her to the [facility] weight loss Center.    [x]   Injection left knee today         PROCEDURE NOTE: LEFT KNEE INJECTION  02/07/2014 at 8:52 AM   Procedure: Injection  Verbal consent was obtained.  Risks and benefits were explained. Questions were encouraged and answered.      Timeout Verification Completed including:    Correct patient: Molly [name] was identified    Correct procedure    Correct site & side    Correct equipment and supplies    Staff member: Molly Register MD     Assistant: Cordelia Pen     Injection Site: Trans-patella tendon    The injection site was prepped with Chlora-Prep using aseptic technique and a left knee intra-articular injection was performed with ethyl chloride for anesthetic.  Depomedrol 2 ml (40 mg/ml = 80 mg total) was injected.  A sterile adhesive dressing was applied.    Post procedure:  The patient tolerated the treatment well.  Instructions to patient:  Appropriate post injections instructions were given to the patient.          Return to Clinic/Follow - Up:  Molly [name] was asked to make a follow-up appointment:    [x]  6 weeks or sooner if the Synvisc 3 is available    Molly [name] was instructed to call the office if her symptoms worsen or if new symptoms appear prior to the next scheduled visit. She is specifically instructed to contact the office between now & her scheduled appointment if she has concerns related to her condition or if she needs assistance in scheduling the above tests. She is welcome to call for an appointment sooner if she has any additional concerns or questions.          Patient Education Materials Provided:  [x]  Dr Caralee Ates: New patient folder, Total Knee Replacement Booklet, Anatomic Drawings and treatment algorithms    Patient Instructions          FOR MORE INFORMATION, CHECK OUT THIS RESOURCE    For your convenience, Dr. Caralee Ates has provided the following link that may be helpful for you if you would like more information on your Orthopaedic condition:    [email or web address]                VITAMIN  D3    Dr Caralee Ates recommends that you add an over-the-counter supplement of vitamin D3, 2,000 IU daily.    Vitamin D3 is widely available without a prescription at pharmacies and buying clubs ([employer], [employer]) and on-line at sites such as MediaChronicles.si. It comes in a variety of formulations (tablets, gelcaps, liquid) and doses (1,000 IU, 2,000 IU, 4,000 IU, 5,000 IU and even higher). The right dose for most  people is 2,000 IU per day but higher doses are sometimes needed.  We have not seen any problems with any of the formulations so we have no reason to recommend a specific brand. You can take your vitamin D3 at any time of the day, with or without food, with or without calcium.  Because vitamin D3 is long acting, if you miss your vitamin D3 on one day you can double up the dose on a later day.       Molly [name] was instructed to apply ice to the injection area for 15 - 20 minutes several times a day to decrease pain and and swelling.     Ice ("ICE IS YOUR FRIEND": try using a bag of frozen peas or corn) for 15 - 20 minutes 3 x day.    Limit activities today.  You may resume normal activities tomorrow if you have no pain.    For severe pain:  If after hours, she is to go to Emergency Room.  During office hours she must come in to the office.  Molly [name] was instructed to call the office if there are any questions or concerns related to her condition.     I have asked her to schedule a follow-up appointment for 6-8 weeks from now for re-evaluation and possible repeat injection.    She is specifically instructed to contact the office between now & her scheduled appointment if she has concerns related to her condition. She is welcome to call for an appointment sooner if she has any additional concerns or questions.    PATIENT REMINDER:   Carry a list of your medications and allergies with you at all times.  Call your pharmacy and our office at least 1 week in advance to refill prescriptions.  Narcotic medications will  not be refilled after hours or on weekends.   I have asked Molly [name] to schedule a follow-up appointment with me after her Synvisc Injections have been approved and are in our office and available for injection.  Often times the pre-cert process for insurance approval may take 1 - 3 weeks.    She is specifically instructed to contact my office between now & her scheduled appointment if she has concerns related to her condition. She is welcome to call for an appointment sooner if she has any additional concerns or questions.  She should also call our office if she needs any help with obtaining the Synvisc.    PATIENT REMINDER:   Carry a list of your medications and allergies with you at all times.  Call your pharmacy and our office at least 1 week in advance to refill prescriptions.  Narcotic medications will not be refilled after hours or on weekends.             Orders Placed This Encounter   Procedures   ??? XR Knee Left Standard   ??? Ambulatory referral to Physical Therapy     Referral Priority:  Routine     Referral Type:  Eval and Treat     Referral Reason:  Specialty Services Required     Number of Visits Requested:  1   ??? [name]     Referral Priority:  Routine     Referral Type:  Consult for Advice and Opinion     Referral Reason:  Specialty Services Required     Requested Specialty:  Nutrition     Number of Visits Requested:  1   ??? SYNVISC INJ (For Auth/Precert)     Patient prefers the 3 injections of Synvisc     Standing Status: Future      Number of Occurrences:       Standing Expiration Date: 02/07/2015   ??? PR ARTHROCENTESIS ASPIR&/INJ MAJOR JT/BURSA W/O US   ??? PR METHYLPREDNISOLONE 40 MG INJ       Refills/New Prescriptions:  Orders Placed This Encounter   Medications   ??? diclofenac sodium (VOLTAREN) 1 % GEL     Sig: Below the waist apply 4 gram 4 times a day     Dispense:  500 g     Refill:  2     Today's prescription medications will be e-scribed (when  appropriate) to the Patient's Preferred Pharmacy:   [facility] [address] - [place], [place] - [address] - P [phone] [phone] [address]- F [phone]  [address]  [place] [id number]  Phone: [phone] Fax: [phone]    [facility] [place] - [id number] - P [phone] [address]- F [phone]  [address]  [place] [id number]  Phone: [phone] Fax: [phone]       Molly RegisterMichelle Quynn Vilchis MD  Board Certified Orthopaedic Surgeon  Knee and Shoulder Surgery Specialist  Electronically signed by Molly RegisterMichelle Toby Ayad MD on 02/07/2014 at 8:52 AM     Contact Information:  Clement Sayres(Zoe HammondBinne, [phone] Medical Secretary)  [facility] main number: use after hours [phone])    This dictation was performed with a verbal recognition program (DRAGON) and it was checked for errors.  It is possible that there are still dictated errors within this office note.  If so, please bring any errors to my attention for an addendum.  All efforts were made to ensure that this office note is accurate.

## 2014-02-07 NOTE — Patient Instructions (Addendum)
FOR MORE INFORMATION, CHECK OUT THIS RESOURCE    For your convenience, Dr. Caralee AtesAndrews has provided the following link that may be helpful for you if you would like more information on your Orthopaedic condition:    [email or web address]                VITAMIN D3    Dr Caralee AtesAndrews recommends that you add an over-the-counter supplement of vitamin D3, 2,000 IU daily.    Vitamin D3 is widely available without a prescription at pharmacies and buying clubs ([employer], [employer]) and on-line at sites such as MediaChronicles.siAmazon.com. It comes in a variety of formulations (tablets, gelcaps, liquid) and doses (1,000 IU, 2,000 IU, 4,000 IU, 5,000 IU and even higher). The right dose for most people is 2,000 IU per day but higher doses are sometimes needed.  We have not seen any problems with any of the formulations so we have no reason to recommend a specific brand. You can take your vitamin D3 at any time of the day, with or without food, with or without calcium.  Because vitamin D3 is long acting, if you miss your vitamin D3 on one day you can double up the dose on a later day.       Molly [name] was instructed to apply ice to the injection area for 15 - 20 minutes several times a day to decrease pain and and swelling.     Ice ("ICE IS YOUR FRIEND": try using a bag of frozen peas or corn) for 15 - 20 minutes 3 x day.    Limit activities today.  You may resume normal activities tomorrow if you have no pain.    For severe pain:  If after hours, she is to go to Emergency Room.  During office hours she must come in to the office.  Molly [name] was instructed to call the office if there are any questions or concerns related to her condition.     I have asked her to schedule a follow-up appointment for 6-8 weeks from now for re-evaluation and possible repeat injection.    She is specifically instructed to contact the office between now & her scheduled appointment if she has concerns related to her condition. She is welcome to call for an appointment  sooner if she has any additional concerns or questions.    PATIENT REMINDER:   Carry a list of your medications and allergies with you at all times.  Call your pharmacy and our office at least 1 week in advance to refill prescriptions.  Narcotic medications will not be refilled after hours or on weekends.   I have asked Molly [name] to schedule a follow-up appointment with me after her Synvisc Injections have been approved and are in our office and available for injection.  Often times the pre-cert process for insurance approval may take 1 - 3 weeks.    She is specifically instructed to contact my office between now & her scheduled appointment if she has concerns related to her condition. She is welcome to call for an appointment sooner if she has any additional concerns or questions.  She should also call our office if she needs any help with obtaining the Synvisc.    PATIENT REMINDER:   Carry a list of your medications and allergies with you at all times.  Call your pharmacy and our office at least 1 week in advance to refill prescriptions.  Narcotic medications will not be refilled after hours or on  weekends.

## 2014-02-07 NOTE — Progress Notes (Signed)
DEPO-MEDROL CORTISONE INJECTION  NDC# [address]9-0280-03  LOT# [id number]  EXP. DATE: 06/2016    SITE: Lt. Knee

## 2014-02-12 ENCOUNTER — Other Ambulatory Visit (INDEPENDENT_AMBULATORY_CARE_PROVIDER_SITE_OTHER): Payer: Federal, State, Local not specified - PPO

## 2014-02-12 ENCOUNTER — Telehealth: Payer: Self-pay

## 2014-02-12 DIAGNOSIS — Z0000 Encounter for general adult medical examination without abnormal findings: Secondary | ICD-10-CM

## 2014-02-12 LAB — HEPATIC FUNCTION PANEL
ALBUMIN: 4.2 g/dL (ref 3.5–5.2)
ALT: 22 U/L (ref 0–35)
AST: 26 U/L (ref 0–37)
Alkaline Phosphatase: 98 U/L (ref 39–117)
Bilirubin, Direct: 0.2 mg/dL (ref 0.0–0.3)
TOTAL PROTEIN: 7 g/dL (ref 6.0–8.3)
Total Bilirubin: 0.6 mg/dL (ref 0.2–1.2)

## 2014-02-12 LAB — BASIC METABOLIC PANEL
BUN: 9 mg/dL (ref 6–23)
CO2: 29 meq/L (ref 19–32)
CREATININE: 0.8 mg/dL (ref 0.4–1.2)
Calcium: 9.4 mg/dL (ref 8.4–10.5)
Chloride: 102 mEq/L (ref 96–112)
GFR: 81.36 mL/min (ref >60.00)
Glucose, Bld: 96 mg/dL (ref 70–99)
Potassium: 4.9 mEq/L (ref 3.5–5.1)
Sodium: 137 mEq/L (ref 135–145)

## 2014-02-12 LAB — CBC WITH DIFFERENTIAL/PLATELET
BASOS PCT: 0.5 % (ref 0.0–3.0)
Basophils Absolute: 0 10*3/uL (ref 0.0–0.1)
Eosinophils Absolute: 0.1 10*3/uL (ref 0.0–0.7)
Eosinophils Relative: 1.6 % (ref 0.0–5.0)
HEMATOCRIT: 42.7 % (ref 36.0–46.0)
HEMOGLOBIN: 14.3 g/dL (ref 12.0–15.0)
Lymphocytes Relative: 31.9 % (ref 12.0–46.0)
Lymphs Abs: 2.6 10*3/uL (ref 0.7–4.0)
MCHC: 33.5 g/dL (ref 30.0–36.0)
MCV: 93.4 fl (ref 78.0–100.0)
MONOS PCT: 8.5 % (ref 3.0–12.0)
Monocytes Absolute: 0.7 10*3/uL (ref 0.1–1.0)
NEUTROS ABS: 4.6 10*3/uL (ref 1.4–7.7)
Neutrophils Relative %: 57.5 % (ref 43.0–77.0)
Platelets: 315 10*3/uL (ref 150.0–400.0)
RBC: 4.57 Mil/uL (ref 3.87–5.11)
RDW: 13.4 % (ref 11.5–15.5)
WBC: 8 10*3/uL (ref 4.0–10.5)

## 2014-02-12 LAB — URINALYSIS, ROUTINE W REFLEX MICROSCOPIC
Bilirubin Urine: NEGATIVE
Hgb urine dipstick: NEGATIVE
Ketones, ur: NEGATIVE
Leukocytes, UA: NEGATIVE
Nitrite: NEGATIVE
PH: 7 (ref 5.0–8.0)
RBC / HPF: NONE SEEN (ref 0–?)
SPECIFIC GRAVITY, URINE: 1.01 (ref 1.000–1.030)
Total Protein, Urine: NEGATIVE
URINE GLUCOSE: NEGATIVE
Urobilinogen, UA: 0.2 (ref 0.0–1.0)
WBC UA: NONE SEEN (ref 0–?)

## 2014-02-12 LAB — LIPID PANEL
Cholesterol: 191 mg/dL (ref 0–200)
HDL: 62.4 mg/dL (ref >39.00)
LDL Cholesterol: 108 mg/dL — ABNORMAL HIGH (ref 0–99)
NONHDL: 128.6
TRIGLYCERIDES: 105 mg/dL (ref 0.0–149.0)
Total CHOL/HDL Ratio: 3
VLDL: 21 mg/dL (ref 0.0–40.0)

## 2014-02-12 LAB — TSH: TSH: 2.09 u[IU]/mL (ref 0.35–4.50)

## 2014-02-12 NOTE — Telephone Encounter (Signed)
CPX labs entered  
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
## 2014-02-12 NOTE — Other (Signed)
Physical Therapy Daily Treatment Note  Date:  2014/03/14    Patient Name:  Molly [name]    DOB:  [birth year]  MRN: [id number]  Restrictions/Precautions:   Medical/Treatment Diagnosis Information:   ?? Diagnosis: L knee OA, DJD, chondromalacia  ?? Treatment Diagnosis: L knee pain, decreased ROM and strength  Insurance/Certification information:  PT Insurance Information: PT BENEFITS 2015/ [employer]/ DONNA/ EFFECTIVE 10-28-12/ ACTIVE/ 1200 DED MET/ PAYS 90%/ 3000 OOP/30 VPCY/ REF# DONNA 02-07-14 10:55/ PAG    Physician Information:  Referring Practitioner: Zadie Rhine, MD  Plan of care signed (Y/N):   Visit# / total visits:  1/30  Pain level: 3/10     G-Code (if applicable):      Date / Visit # G-Code Applied:  2014-03-14 visit #1  PT G-Codes  Functional Assessment Tool Used: LEFS  Score: 30/80=37.5% (62.5% deficit)  Functional Limitation: Mobility: Walking and moving around  Mobility: Walking and Moving Around Current Status (O8416): At least 60 percent but less than 80 percent impaired, limited or restricted  Mobility: Walking and Moving Around Goal Status [phone]): At least 1 percent but less than 20 percent impaired, limited or restricted    Progress Note: []   Yes  [x]   No  Next due by: week of 03/12/14      Subjective: See eval    Objective:  Observation:  ?? Quad Recruitment: fair - on L; fair + on R  ?? Effusion: medial and lateral aspects of infra patella, "pocket" of fluid, seen bilaterally  ?? Palpation: + joint lines  ?? Gait: Decreased step length, increased lateral sway  Flexibility  L  R  Comment    Hamstring  SLR 70 degrees  SLR 80 degrees     Gastroc  Mild tightness  Mild tightness     Quad  Mod tightness  - tightness       ROM  PROM  AROM  Overpressure  Comment     L  R  L  R  L  R     Flexion  122  134  117  129       Extension  -10  -3  -8  0         Strength  L  R  Comment    Quad  4-/5  4/5     Hamstring  4-/5  4/5     Gastroc  4+/5  4+/5     Hip flex  4/5  4/5     Glute max  4-/5  4-/5     Glute  med  4-/5  4-/5     Glute min  4-/5  4-/5       Special Test  Results/Comment    Meniscal Click  -    Crepitus  +    Valgus Laxity  -    Varus Laxity  -      Girth  L  R    Mid Patella      Suprapatellar      5cm above      15cm above            Exercises:  Exercise/Equipment Resistance/Repetitions Other comments   Stretching     Hamstring 5x30"    Hip Flexion     ITB     Grion     Quad     Inclined Calf     Towel Pull 5x30"         SLR  Supine 3x10 bilateral    Prone     Abduction 3x10    Adducton     SLR+          Isometrics     Quad sets 10x10"    Adduction 10x10"         Patellar Glides     Medial     Superior     Inferior          ROM     Passive     Active     Weight Shift     Hang Weights     Sheet Pulls     Ankle Pumps          CKC     Calf raises     Wall sits     Step ups     1 leg stand     Squatting     CC TKE     Balance          PRE     Extension  RANGE:   Flexion  RANGE:        Cable Column          Leg Press  RANGE:        Bike     Treadmill            Other Therapeutic Activities: N/A    Home Exercise Program:   Initiated 02/12/14. Printed hand out given. Pt demonstrated proper form of each exercise and expressed verbal understanding of frequency and duration.     Patient Education:      02/12/14 Pt educated on diagnosis, prognosis and PT plan of care. Educated on [place]. Pt questions were addressed and answered.    Manual Treatments: N/A    Modalities:  15'  []  hot packs  []  EMS   []  Ultrasound  [x]  ice   []  vasopneumatic  []  high volt/EGS  []  phono  []  tens    []  ionto  []  autorange/biodex []  Interferential  []  other    Timed Code Treatment Minutes: 55 (IEx15 TEx40)    Total Treatment Minutes: 75    Assessment:  [x]  Patient tolerated treatment well []  Patient limited by fatigue  []  Patient limited by pain  []  Patient limited by other medical complications  [x]  Other: See eval     Plan for Next Session:  Assess patella mobility. Calf raises, step up/down, CCTKE.    Prognosis: []  Good [x]  Fair  []   Poor    Patient Requires Follow-up: [x]  Yes  []  No      Goals:  Short term goals  Time Frame for Short term goals: 2 weeks 02/26/14  Short term goal 1: Independent HEP  Short term goal 2: Decrease pain by 1-2 grades  Short term goal 3: Improve PROM knee flexion by 5-10 degrees  Short term goal 4: Improve AROM knee flexion by 5 degrees or greater    Long term goals  Time Frame for Long term goals : 4-6 weeks 03/12/14-03/26/14  Long term goal 1: PROM 0-130 degrees or greater  Long term goal 2: AROM 0-125 degrees or greater  Long term goal 3: Pain free at rest; pain 0-2/10 with ADLs  Long term goal 4: BLUE grossly 4/5 or greater  Long term goal 5: Improve LEFS score to reflect 1-20% deficit      Plan:   []  Continue per plan of care []  Alter current plan (see comments)  [x]  Plan of care  initiated []  Hold pending MD visit []  Discharge    MD Follow-up:     Electronically signed by:  Shela Leff, DPT

## 2014-02-12 NOTE — Telephone Encounter (Signed)
SYNVISC INJECTION  SERIES OF (3)  LT KNEE [id number]   APPROVED PER DARRELL @ [facility]  [phone]   AUTH # [id number]   GOOD FROM 02-12-14  THRU 05-15-14  -LML

## 2014-02-14 NOTE — Telephone Encounter (Signed)
LMOM to schedule Synvisc series left knee, instructed to schedule 3 consecutive weeks.

## 2014-02-17 NOTE — Other (Signed)
Physical Therapy Daily Treatment Note  Date:  02/17/2014    Patient Name:  Molly [name]    DOB:  [birth year]  MRN: [id number]  Restrictions/Precautions:   Medical/Treatment Diagnosis Information:   ?? Diagnosis: L knee OA, DJD, chondromalacia  ?? Treatment Diagnosis: L knee pain, decreased ROM and strength  Insurance/Certification information:  PT Insurance Information: PT BENEFITS 2015/ [employer]/ DONNA/ EFFECTIVE 10-28-12/ ACTIVE/ 1200 DED MET/ PAYS 90%/ 3000 OOP/30 VPCY/ REF# DONNA 02-07-14 10:55/ PAG    Physician Information:  Referring Practitioner: Zadie Rhine, MD  Plan of care signed (Y/N):   Visit# / total visits:  2/30  Pain level: 3/10     G-Code (if applicable):      Date / Visit # G-Code Applied:  03/09/2014 visit #1  PT G-Codes  Functional Assessment Tool Used: LEFS  Score: 30/80=37.5% (62.5% deficit)  Functional Limitation: Mobility: Walking and moving around  Mobility: Walking and Moving Around Current Status (U4403): At least 60 percent but less than 80 percent impaired, limited or restricted  Mobility: Walking and Moving Around Goal Status [phone]): At least 1 percent but less than 20 percent impaired, limited or restricted    Progress Note: []   Yes  [x]   No  Next due by: week of 03/12/14      Subjective: Pt states the knee is feeling okay. Feels about the same as last week. No problems with HEP, feels that the exercises are getting a little easier.    Objective:  Observation:  ?? Quad Recruitment: fair - on L; fair + on R  ?? Effusion: medial and lateral aspects of infra patella, "pocket" of fluid, seen bilaterally  ?? Palpation: + joint lines  ?? Gait: Decreased step length, increased lateral sway  ?? Patella mobility: good  Flexibility  L  R  Comment    Hamstring  SLR 70 degrees  SLR 80 degrees     Gastroc  Mild tightness  Mild tightness     Quad  Mod tightness  - tightness       ROM  PROM  AROM  Overpressure  Comment     L  R  L  R  L  R     Flexion  122  134  117  129       Extension  -10  -3  -8   0         Strength  L  R  Comment    Quad  4-/5  4/5     Hamstring  4-/5  4/5     Gastroc  4+/5  4+/5     Hip flex  4/5  4/5     Glute max  4-/5  4-/5     Glute med  4-/5  4-/5     Glute min  4-/5  4-/5       Special Test  Results/Comment    Meniscal Click  -    Crepitus  +    Valgus Laxity  -    Varus Laxity  -      Girth  L  R    Mid Patella      Suprapatellar      5cm above      15cm above            Exercises:  Exercise/Equipment Resistance/Repetitions Other comments   Stretching     Hamstring 5x30"    Hip Flexion     ITB     Grion  Quad     Inclined Calf     Towel Pull 5x30"         SLR     Supine 3x10 bilateral    Prone     Abduction 3x10    Adducton     SLR+          Isometrics     Quad sets    Adduction         Patellar Glides     Medial     Superior     Inferior          ROM     Passive     Active     Weight Shift     Hang Weights     Sheet Pulls     Ankle Pumps          CKC     Calf raises 3x15    Wall sits     Step ups/over 3x10 L1    1 leg stand     Squatting     CC TKE 3x15 black   Balance 3x30" tandem bilateral        PRE     Extension 3x10 10# RANGE:   Flexion 3x10 25# RANGE:        Cable Column          Leg Press  RANGE:        Bike     Treadmill            Other Therapeutic Activities: N/A    Home Exercise Program:   Initiated 02/12/14. Printed hand out given. Pt demonstrated proper form of each exercise and expressed verbal understanding of frequency and duration.     Patient Education:      02/12/14 Pt educated on diagnosis, prognosis and PT plan of care. Educated on [place]. Pt questions were addressed and answered.    Manual Treatments: N/A    Modalities:  15'  []  hot packs  []  EMS   []  Ultrasound  [x]  ice   []  vasopneumatic  []  high volt/EGS  []  phono  []  tens    []  ionto  []  autorange/biodex []  Interferential  []  other    Timed Code Treatment Minutes: 45 (TEx30 TAx15)    Total Treatment Minutes: 75    Assessment:  [x]  Patient tolerated treatment well []  Patient limited by fatigue  []  Patient  limited by pain  []  Patient limited by other medical complications  [x]  Other: Pt noted 6/10 pain with SLR on L. Required verbal cues for proper form on supine SLR as pt was allowing knee to flex rather than maintaining tight quad. Pain significantly decreased when exercise performed with proper quad control. Attempted 1# weight but too painful, continue to hold weight at this time secondary to quad weakness and difficulty maintaining proper form. Noted increased difficulty in tandem when L leg was behind R leg. Pt requires continued PT to address ROM and strength deficits.    Plan for Next Session: Continue per POC above    Prognosis: []  Good [x]  Fair  []  Poor    Patient Requires Follow-up: [x]  Yes  []  No      Goals:  Short term goals  Time Frame for Short term goals: 2 weeks 02/26/14  Short term goal 1: Independent HEP  Short term goal 2: Decrease pain by 1-2 grades  Short term goal 3: Improve PROM knee flexion by 5-10 degrees  Short term goal 4: Improve AROM  knee flexion by 5 degrees or greater    Long term goals  Time Frame for Long term goals : 4-6 weeks 03/12/14-03/26/14  Long term goal 1: PROM 0-130 degrees or greater  Long term goal 2: AROM 0-125 degrees or greater  Long term goal 3: Pain free at rest; pain 0-2/10 with ADLs  Long term goal 4: BLUE grossly 4/5 or greater  Long term goal 5: Improve LEFS score to reflect 1-20% deficit      Plan:   [x]  Continue per plan of care []  Alter current plan (see comments)  []  Plan of care initiated []  Hold pending MD visit []  Discharge    MD Follow-up:     Electronically signed by:  Shela Leff, DPT

## 2014-02-18 ENCOUNTER — Encounter: Payer: Self-pay | Admitting: Internal Medicine

## 2014-02-18 ENCOUNTER — Ambulatory Visit (INDEPENDENT_AMBULATORY_CARE_PROVIDER_SITE_OTHER): Payer: Federal, State, Local not specified - PPO | Admitting: Internal Medicine

## 2014-02-18 VITALS — BP 120/82 | HR 58 | Temp 98.5°F | Ht 65.5 in | Wt 242.0 lb

## 2014-02-18 DIAGNOSIS — Z800 Family history of malignant neoplasm of digestive organs: Secondary | ICD-10-CM

## 2014-02-18 DIAGNOSIS — Z0000 Encounter for general adult medical examination without abnormal findings: Secondary | ICD-10-CM | POA: Insufficient documentation

## 2014-02-18 HISTORY — DX: Family history of malignant neoplasm of digestive organs: Z80.0

## 2014-02-18 MED ORDER — OLMESARTAN MEDOXOMIL 20 MG PO TABS: 20.0000 mg | ORAL_TABLET | Freq: Every day | ORAL | Status: AC

## 2014-02-18 MED ORDER — TRIAMCINOLONE ACETONIDE 55 MCG/ACT NA AERO: 2.0000 | INHALATION_SPRAY | Freq: Every day | NASAL | Status: DC

## 2014-02-18 NOTE — Assessment & Plan Note (Signed)

## 2014-02-18 NOTE — Progress Notes (Signed)
Pre visit review using our clinic review tool, if applicable. No additional management support is needed unless otherwise documented below in the visit note. 

## 2014-02-18 NOTE — Patient Instructions (Signed)
Please continue all other medications as before, and refills have been done if requested.  Please have the pharmacy call with any other refills you may need.  Please continue your efforts at being more active, low cholesterol diet, and weight control.  You are otherwise up to date with prevention measures today.  Please keep your appointments with your specialists as you may have planned  Please return in 1 year for your yearly visit, or sooner if needed, with Lab testing done 3-5 days before  

## 2014-02-18 NOTE — Progress Notes (Signed)
Subjective:    Patient ID: Jamelle HaringBeth Ann Gonder, female    DOB: 1960-12-06, 53 y.o.   MRN: [id number]  HPI  Here for wellness and f/u;  Overall doing ok;  Pt denies CP, worsening SOB, DOE, wheezing, orthopnea, PND, worsening LE edema, palpitations, dizziness or syncope.  Pt denies neurological change such as new headache, facial or extremity weakness.  Pt denies polydipsia, polyuria, or low sugar symptoms. Pt states overall good compliance with treatment and medications, good tolerability, and has been trying to follow lower cholesterol diet.  Pt denies worsening depressive symptoms, suicidal ideation or panic. No fever, night sweats, wt loss, loss of appetite, or other constitutional symptoms.  Pt states good ability with ADL's, has low fall risk, home safety reviewed and adequate, no other significant changes in hearing or vision, and only occasionally active with exercise. Does have several wks ongoing nasal allergy symptoms with clearish congestion, itch and sneezing, without fever, pain, ST, cough, swelling or wheezing - needs nasacort refill.  Past Medical History  Diagnosis Date  . Morbid obesity 03/17/2007  . DEPRESSION 03/17/2007  . HYPERTENSION 03/17/2007  . DEGENERATIVE JOINT DISEASE, RIGHT KNEE 03/17/2007  . RASH-NONVESICULAR 11/17/2009  . BREAST IMPLANTS, BILATERAL, HX OF 11/17/2009  . Hyperlipidemia 12/19/2010  . Family history of colon cancer 02/18/2014    And father with polyps   Past Surgical History  Procedure Laterality Date  . Gastric bypass  2007  . Breast enhancement surgery  1981  . Jaw surgury  1980    reports that she has never smoked. She does not have any smokeless tobacco history on file. She reports that she drinks alcohol. Her drug history is not on file. family history includes Cancer in her other; Diabetes in her father and mother; Heart attack in her father; Heart disease in her father and other; Hyperlipidemia in her father; Transient ischemic attack in her  other. Allergies  Allergen Reactions  . Codeine    \ Current Outpatient Prescriptions on File Prior to Visit  Medication Sig Dispense Refill  . B Complex Vitamins (B COMPLEX 100 PO) Take by mouth daily.        . Calcium-Magnesium 250-125 MG TABS Take by mouth daily.        . cetirizine (ZYRTEC) 10 MG tablet TAKE 1 TABLET BY MOUTH EVERY DAY AS NEEDED  90 tablet  0  . clonazePAM (KLONOPIN) 0.5 MG tablet Take 0.5 mg by mouth 2 (two) times daily as needed.      . Levonorgestrel (MIRENA IU) by Intrauterine route.        Marland Kitchen. levothyroxine (SYNTHROID, LEVOTHROID) 112 MCG tablet TAKE 1 TABLET BY MOUTH EVERY DAY  90 tablet  0  . metroNIDAZOLE (METROGEL) 0.75 % gel Apply topically 2 (two) times daily.        . Multiple Vitamin (MULTIVITAMIN) capsule Take 1 capsule by mouth daily.        . nefazodone (SERZONE) 200 MG tablet 1 tablet in the morning and 2 tablets in the evening       . sertraline (ZOLOFT) 50 MG tablet Take 50 mg by mouth daily.        . temazepam (RESTORIL) 30 MG capsule Take 30 mg by mouth as needed.         No current facility-administered medications on file prior to visit.    Review of Systems Constitutional: Negative for increased diaphoresis, other activity, appetite or other siginficant weight change  HENT: Negative for worsening hearing loss, ear  pain, facial swelling, mouth sores and neck stiffness.   Eyes: Negative for other worsening pain, redness or visual disturbance.  Respiratory: Negative for shortness of breath and wheezing.   Cardiovascular: Negative for chest pain and palpitations.  Gastrointestinal: Negative for diarrhea, blood in stool, abdominal distention or other pain Genitourinary: Negative for hematuria, flank pain or change in urine volume.  Musculoskeletal: Negative for myalgias or other joint complaints.  Skin: Negative for color change and wound.  Neurological: Negative for syncope and numbness. other than noted Hematological: Negative for adenopathy.  or other swelling Psychiatric/Behavioral: Negative for hallucinations, self-injury, decreased concentration or other worsening agitation.      Objective:   Physical Exam BP 120/82  Pulse 58  Temp(Src) 98.5 F (36.9 C) (Oral)  Ht 5' 5.5" (1.664 m)  Wt 242 lb (109.77 kg)  BMI 39.64 kg/m2  SpO2 95% VS noted,  Constitutional: Pt is oriented to person, place, and time. Appears well-developed and well-nourished.  Head: Normocephalic and atraumatic.  Right Ear: External ear normal.  Left Ear: External ear normal.  Nose: Nose normal.  Mouth/Throat: Oropharynx is clear and moist.  Eyes: Conjunctivae and EOM are normal. Pupils are equal, round, and reactive to light.  Neck: Normal range of motion. Neck supple. No JVD present. No tracheal deviation present.  Cardiovascular: Normal rate, regular rhythm, normal heart sounds and intact distal pulses.   Pulmonary/Chest: Effort normal and breath sounds without rales or wheezing  Abdominal: Soft. Bowel sounds are normal. NT. No HSM  Musculoskeletal: Normal range of motion. Exhibits no edema.  Lymphadenopathy:  Has no cervical adenopathy.  Neurological: Pt is alert and oriented to person, place, and time. Pt has normal reflexes. No cranial nerve deficit. Motor grossly intact Skin: Skin is warm and dry. No rash noted.  Psychiatric:  Has normal mood and affect. Behavior is normal.     Assessment & Plan:

## 2014-02-18 NOTE — Telephone Encounter (Signed)
-----   Message from [facility] My[facility] sent at 02/18/2014 10:29 AM EDT -----  Regarding: Non-Urgent Medical Question  Contact: [phone]  How will I be notified regarding Dr Caralee AtesAndrews request for ins approval for syn vic injections?  I am leaving on July 31 for a camping vacation and hoped this could be completed prior.      Thanks

## 2014-02-18 NOTE — Telephone Encounter (Signed)
From: Molly [name]  To: Molly RegisterMichelle Andrews, MD  Sent: 02/18/2014 10:29 AM EDT  Subject: Non-Urgent Medical Question    How will I be notified regarding Dr Caralee AtesAndrews request for ins approval for syn vic injections? I am leaving on July 31 for a camping vacation and hoped this could be completed prior.     Thanks

## 2014-02-18 NOTE — Telephone Encounter (Signed)
LMOM for Molly [name] that Synvisc has been approved, she needs to call and schedule the appointments. This is the 3 injection series so she will need to make 3 appointments.    Bjorn LoserRhonda had also LMOM on 02/14/14.

## 2014-02-18 NOTE — Telephone Encounter (Signed)
I spoke with Waynetta SandyBeth, her appointments are made.

## 2014-02-19 ENCOUNTER — Other Ambulatory Visit: Payer: Self-pay | Admitting: Internal Medicine

## 2014-02-19 NOTE — Other (Signed)
Physical Therapy Daily Treatment Note  Date:  02/19/2014    Patient Name:  Molly [name]    DOB:  Oct 31, 1960  MRN: [id number]  Restrictions/Precautions:   Medical/Treatment Diagnosis Information:   ?? Diagnosis: L knee OA, DJD, chondromalacia  ?? Treatment Diagnosis: L knee pain, decreased ROM and strength  Insurance/Certification information:  PT Insurance Information: PT BENEFITS 2015/ N[employer]/ DONNA/ EFFECTIVE 10-28-12/ ACTIVE/ 1200 DED MET/ PAYS 90%/ 3000 OOP/30 VPCY/ REF# DONNA 02-07-14 10:55/ PAG    Physician Information:  Referring Practitioner: Zadie Rhine, MD  Plan of care signed (Y/N):   Visit# / total visits:  3/30  Pain level: 3/10     G-Code (if applicable):      Date / Visit # G-Code Applied:  02/16/2014 visit #1  PT G-Codes  Functional Assessment Tool Used: LEFS  Score: 30/80=37.5% (62.5% deficit)  Functional Limitation: Mobility: Walking and moving around  Mobility: Walking and Moving Around Current Status (R5188): At least 60 percent but less than 80 percent impaired, limited or restricted  Mobility: Walking and Moving Around Goal Status [phone]): At least 1 percent but less than 20 percent impaired, limited or restricted    Progress Note: []   Yes  [x]   No  Next due by: week of 03/12/14      Subjective: Pt states the knee is feeling okay. But her groin is really bothering her. States she had cancer awhile back and had surgery in the groin, not sure if it might be scar tissue that's bothering her. Really notices the pain with supine SLRs, sharp 8/10.    Objective:  Observation:  ?? Significant knee genu valgum in standing  ?? Quad Recruitment: fair - on L; fair + on R  ?? Effusion: medial and lateral aspects of infra patella, "pocket" of fluid, seen bilaterally  ?? Palpation: + joint lines  ?? Gait: Decreased step length, increased lateral sway.  ?? Patella mobility: good  Flexibility  L  R  Comment    Hamstring  SLR 70 degrees  SLR 80 degrees     Gastroc  Mild tightness  Mild tightness     Quad  Mod  tightness  - tightness       ROM  PROM  AROM  Overpressure  Comment     L  R  L  R  L  R     Flexion  122  134  117  129       Extension  -10  -3  -8  0         Strength  L  R  Comment    Quad  4-/5  4/5     Hamstring  4-/5  4/5     Gastroc  4+/5  4+/5     Hip flex  4/5  4/5     Glute max  4-/5  4-/5     Glute med  4-/5  4-/5     Glute min  4-/5  4-/5       Special Test  Results/Comment    Meniscal Click  -    Crepitus  +    Valgus Laxity  -    Varus Laxity  -      Girth  L  R    Mid Patella      Suprapatellar      5cm above      15cm above            Exercises:  Exercise/Equipment  Resistance/Repetitions Other comments   Stretching     Hamstring 5x30"    Hip Flexion     ITB     Grion 3x30" Standing, L leg straight lunging towards R   Quad     Inclined Calf     Towel Pull 5x30"         SLR     Supine   3x10   standing   Prone     Abduction 3x10    Adducton     SLR+          Isometrics     Quad sets    Adduction         Patellar Glides     Medial     Superior     Inferior          ROM     Passive     Active     Weight Shift     Hang Weights     Sheet Pulls     Ankle Pumps          CKC     Calf raises 3x15    Wall sits     Step ups/over     1 leg stand     Squatting     CC TKE 3x15 black   Balance 3x30" tandem bilateral        PRE     Extension 3x10 10# RANGE:   Flexion 3x10 25# RANGE:        Cable Column          Leg Press  RANGE:        Bike     Treadmill            Other Therapeutic Activities: N/A    Home Exercise Program:   Initiated 02/12/14. Printed hand out given. Pt demonstrated proper form of each exercise and expressed verbal understanding of frequency and duration.     Patient Education:      02/12/14 Pt educated on diagnosis, prognosis and PT plan of care. Educated on [place]. Pt questions were addressed and answered.    Manual Treatments: N/A    Modalities:  15'  []  hot packs  [x]  EMS VMS burst  []  Ultrasound  [x]  ice   []  vasopneumatic  []  high volt/EGS  []  phono  []  tens    []  ionto  []  autorange/biodex []   Interferential  []  other    Timed Code Treatment Minutes: 45 (TEx30 TAx15)    Total Treatment Minutes: 60    Assessment:  [x]  Patient tolerated treatment well []  Patient limited by fatigue  []  Patient limited by pain  []  Patient limited by other medical complications  [x]  Other: Noted improvement in groin pain post stretching. Pt unable to maintain knee extension/quad control during descent from supine SLR, pt would get about 50% of the way through the movement and then the knee would bend and the leg fall to the table. On Monday pt required continued verbal cues to maintain tight quad, but she was able to control descent unlike today. Hold on step ups today d/t lack of quad control. Began VMS burst to promote quad recruitment. Pt requires continued PT to address ROM and strength deficits.    Plan for Next Session: SAQ; consider re-addition of supine SLR and step overs depending on quad control    Prognosis: []  Good [x]  Fair  []  Poor    Patient Requires Follow-up: [x]  Yes  []  No  Goals:  Short term goals  Time Frame for Short term goals: 2 weeks 02/26/14  Short term goal 1: Independent HEP  Short term goal 2: Decrease pain by 1-2 grades  Short term goal 3: Improve PROM knee flexion by 5-10 degrees  Short term goal 4: Improve AROM knee flexion by 5 degrees or greater    Long term goals  Time Frame for Long term goals : 4-6 weeks 03/12/14-03/26/14  Long term goal 1: PROM 0-130 degrees or greater  Long term goal 2: AROM 0-125 degrees or greater  Long term goal 3: Pain free at rest; pain 0-2/10 with ADLs  Long term goal 4: BLUE grossly 4/5 or greater  Long term goal 5: Improve LEFS score to reflect 1-20% deficit      Plan:   [x]  Continue per plan of care []  Alter current plan (see comments)  []  Plan of care initiated []  Hold pending MD visit []  Discharge    MD Follow-up:     Electronically signed by:  Shela Leff, DPT

## 2014-02-28 MED ORDER — ULTRAM 50 MG PO TABS
50 MG | ORAL_TABLET | Freq: Four times a day (QID) | ORAL | Status: DC | PRN
Start: 2014-02-28 — End: 2015-09-29

## 2014-02-28 NOTE — Patient Instructions (Signed)
Britnie A Freeburg was instructed to apply ice to the injection area for 15 - 20 minutes several times a day to decrease pain and and swelling.     Ice ("ICE IS YOUR FRIEND": try using a bag of frozen peas or corn) for 15 - 20 minutes 3 x day.    Limit activities today.  You may resume normal activities tomorrow if you have no pain.    For severe pain:  If after hours, she is to go to Emergency Room.  During office hours she must come in to the office.  Kynzie A Vandeventer was instructed to call the office if there are any questions or concerns related to her condition.     I have asked her to schedule a follow-up appointment for 1 week from now for re-evaluation and possible repeat injection.    She is specifically instructed to contact the office between now & her scheduled appointment if she has concerns related to her condition. She is welcome to call for an appointment sooner if she has any additional concerns or questions.    General Medication Instructions:  Any prescriptions must be used as directed.  If you have any concerns, questions or require refills, please contact our office.  If you experience any adverse reactions, stop the medication and call our office immediately.    If you designate a preferred pharmacy, appropriate prescriptions will be sent to your preferred pharmacy for pickup to be use as directed.    Patient Driving Instructions:  No driving if you are taking narcotic pain medications or muscle relaxers.    PATIENT REMINDER:   Carry a list of your medications and allergies with you at all times.  Call your pharmacy and our office at least 1 week in advance to refill prescriptions.  Narcotic medications will not be refilled after hours or on weekends.         ATTENTION    As of May 23, 2013, the [place] [employer] (DEA) has mandated that ALL PRESCRIPTION PAIN MEDICINE cannot be called into your pharmacy.    This includes:    Oxycodone (Percocet)  Hydrocodone (Vicodin,  Norco)  Tramadol (Ultram)  Other    These medications must have prescriptions which are written and signed by your doctor (Dr. Caralee AtesAndrews).  This means that you must call ahead and come in to the office to pick up the paper prescription and take it to your pharmacy.    We are sorry for any inconvenience but this is now the law.    Lisabeth RegisterMichelle Andrews MD  Board Certified Orthopaedic Surgeon  Knee and Shoulder Surgery Specialist    Contact Information:  Clement Sayres(Zoe MountainaireBinne, [phone] Medical Secretary)  [facility] main number: use after hours [phone])              IMPORTANT NARCOTIC INFORMATION: PLEASE READ     [x]  DO NOT share your prescription medication with anyone!  Sharing is illegal.  The prescription dose is based on your age, weight, and health problems.  Sharing your narcotic prescription can lead to accidental death of the individual for which the prescription was not prescribed.  You may not know about his/her addiction problem.     [x]  Always use the same pharmacy when filling your narcotic prescriptions.     [x]  DO NOT mix your narcotic prescription with alcohol.  Mixing the narcotic prescription medication with alcohol causes depressive effects including breathing problems, organ malfunction, and cardiac arrest.     [  x] Always keep your narcotic medication in a locked, secured location.  Keep your medication private.  This is to avoid individuals from taking your medication without your knowledge.  This medication is highly sought after and locking your prescriptions will protect you from being a target of crime.     [x]  DO NOT stock pile your medication.  This also will protect you from being a target of crime.     [x]  Dispose of any unused medications properly.  Do not flush the medications.  Look for appropriate waste collection programs in your community and drug take back events.     [x]  DO NOT drive while taking narcotic pain medication or muscle relaxers.              VITAMIN D3    Dr Caralee Ates recommends that  you add an over-the-counter supplement of vitamin D3, (at least 2,000 IU daily).    Vitamin D3 is widely available without a prescription at pharmacies and buying clubs ([employer], [employer]) and on-line at sites such as [employer]. It comes in a variety of formulations (tablets, gelcaps, liquid) and doses (1,000 IU, 2,000 IU, 4,000 IU, 5,000 IU and even higher). The right dose for most people is 2,000 IU per day but higher doses are sometimes needed.  We have not seen any problems with any of the formulations so we have no reason to recommend a specific brand. You can take your vitamin D3 at any time of the day, with or without food, with or without calcium.  Because vitamin D3 is long acting, if you miss your vitamin D3 on one day you can double up the dose on a later day.

## 2014-02-28 NOTE — Progress Notes (Signed)
SYNVISC 3 SHOT     NDC:5846-0090-01    LOT# [id number]  EXP. DATE 09/2015   SITE: [facility] Knee     LIDOCAINE  NDC: 1610-9604-54  LOT: [id number]  EXP: 11/21/2014    We provided the Synvisc medication. This was an aspiration also.  Injection #1 of 3.

## 2014-02-28 NOTE — Progress Notes (Signed)
Molly Register, [name]  Orthopaedic Surgery & Sports Medicine  Knee & Shoulder Specialist    []   [place] OFFICE    [x]   [name] OFFICE  [address]                    [address]  [place], [place]  [address]                   [place], [place]  [address]  [phone]                     [phone]  [phone] (fax)    [phone] (fax)      PATIENT: Molly [name]    53 y.o.  female  DATE OF BIRTH: July 23, 1961   MRN:  [id number]       Date of current encounter: 02/28/2014  This encounter is evaluated as a:       []  New Patient Visit    [x]  Established Patient Visit   []  Post-Op Visit     []  Consult: requested by         []  Worker's Comp       Patient's PCP is Dr. Merla Riches, [name]    Subjective:     Reason for Visit: left knee injection: Synvisc #1 out of a series of 3 injections    Chief Complaint   Patient presents with   ??? Knee Pain     left knee synvisc #1       HPI:  Molly [name] is a 54 y.o. year old, Caucasian female complaining of left knee pain.     PAIN ASSESSMENT:   Molly [name] has a pain level on 0/10 scale:  6-8 out of 10 with activity and 2 out of 10 at rest. Her pain is intermittent and occurs daily.  When the pain occurs, it lasts minutes.  She reports that NSAID's, rest makes her pain better and going up and down stairs, rising after sitting and walking makes her pain worse. She denies pain at night.  She has numbness and tingling.  She describes her pain as throbbing, dull and sharp.  Her pain is located at the anterior, lateral and medial aspect of her left knee.  Her pain does radiate.  She feels that the problem is gradually worsening.  The pain is limiting her behavior.    Patient Active Problem List   Diagnosis   ??? H/O vitamin D deficiency   ??? History of non-Hodgkin's lymphoma   ??? Inflammatory arthritis (HCC)   ??? Impaired glucose tolerance   ??? HLD (hyperlipidemia)   ??? Vitamin D deficiency   ??? S/P left ACL reconstruction 1991 by Dr. Henrene Hawking   ??? Chondromalacia patellae of left  knee   ??? Tear of lateral meniscus of left knee   ??? Tear of medial meniscus of left knee   ??? Left knee DJD   ??? Morbid obesity with BMI of 40.0-44.9, adult (HCC)   ??? Acquired valgus deformity knee   ??? Knee effusion       Allergies:  Sulfa antibiotics    Medications:  Outpatient Prescriptions Marked as Taking for the 02/28/14 encounter (Office Visit) with Molly Register, [name]   Medication Sig Dispense Refill   ??? traMADol (ULTRAM) 50 MG TABS Take 50 mg by mouth every 6 hours as needed Take one tab by mouth every 6 hours for pain as needed.  30 tablet  0  Molly [name]'s past medical history, family history, social history and review of systems have been reviewed and are recorded in the chart.    Objective:     Physical Exam  Vital Signs:  BP 134/86    Ht 5\' 4"  (1.626 m)    Wt 290 lb (131.543 kg)    BMI 49.75 kg/m2       Constitution:  Generally, Molly [name] is [x]  alert, [x]  appears stated age, and [x]  in no distress.  Her general body habitus is []  Cachectic  []  Thin  []  Normal  []  Obese  [x]  Morbidly Obese    Head: [x]  Normocephalic  Eyes: [x]  Extra-occular muscles intact  Left Ear: [x]  External Ear normal   Right Ear: [x]  External Ear normal   Nose: [x]  Normal  Mouth: [x]  Oral mucosa moist  [x]  No perioral lesions    Pulmonary: [x]  Respirations unlabored and regular  Skin: [x]  Warm [x]  Well perfused     Psychiatric:   [x]  Good judgement and insight  [x]  Oriented to [x]  person, [x]  place, and [x]  time.  [x]  Mood appropriate for circumstances.    Gait:   Gait is []  Normal  [x]  Impaired limping  Assistive Device: [x]  None  []  Knee Brace  []  Cane  []  Crutches   []  Walker   []  Wheelchair  []  Other     ORTHOPAEDIC KNEE EXAM: LEFT    Inspection:  [x]  Skin intact without abrasion or lacerations.  []  Ecchymosis:  [x]  none  []  mild  []  moderate  []  severe  []  Atrophy:  [x]  none  []  mild  []  moderate  []  severe  [x]  Effusion: []  none  []  mild  []  moderate  [x]  severe  [x]  Scar / Surgical incision(s): [x]  A-Scope Portals   [x]  Open Surgical Incision(s)    Alignment:  []  Normal  []  Varus [x]  Valgus    Range of Motion:  []  Normal Knee ROM         []  Deferred: acute injury/post-surgery/pain   [x]  Limited ROM:     Palpation:   []  No Tenderness  [x]  Tenderness: Anterior [x]  mild  []  moderate  []  severe   [x]  Patellofemoral Crepitation:  []  none  [x]  mild  []  moderate  []  severe    Motor Function:   [x]  No gross motor weakness  []  Motor weakness:  []  mild  []  moderate  []  severe     Neurologic:  [x]  Sensation to light touch intact   [x]  Coordination / proprioception intact    Circulation:  [x]  The limb is warm and well perfused  [x]  Capillary refill is intact.  []  Edema  [x]  none  []  mild  []  moderate  []  severe  []  Venous stasis changes  [x]  none  []  mild  []  moderate  []  severe    Contralateral Knee:  [x]  Normal ROM with no pain.     Bilateral Hip Exam:  [x]  Negative logroll    Data Reviewed:     No imaging studies were obtained today.    XRays:  (3 views: AP, lateral and patella views) of her left knee taken on 02/07/2014 showed severe degenerative changes in the patellofemoral compartment, severe degenerative changes in the medial compartment and severe (bone-on-bone) degenerative changes in the lateral compartment. There is severe joint space narrowing and osteophyte formation in the lateral compartment and the patellofemoral compartment as well as medial compartment.  There is valgus alignment. There are retained staples: Two  in the distal lateral femur, one in the proximal medial tibia consistent with a previous anterior cruciate ligament reconstruction with visible tunnels.    MRI:  The MRI of her left knee dated 12/09/2013 shows high-grade chondromalacia patella with tiny subchondral ulceration at the patellar apex. Large joint effusion, complex nature, likely associated with synovial hyperplasia. Advanced degenerative osteoarthritis in the medial and lateral joint space compartments. Horizontal tear posterior medial meniscal horn.  Maceration of the anterior lateral meniscus horn with horizontal tear within the meniscal remnant. Intact anterior cruciate ligament graft in isometric position. Popliteal tendon insertion strain or low-grade partial-thickness tear.         PROCEDURE NOTE: LEFT KNEE ASPIRATION AND SYNVISC (2 ml) INJECTION: #1 of the series of 3 injections  02/28/2014 at 1:42 PM   Procedure: Aspiration and first Synvisc Injection out of a series of three injections  Verbal consent was obtained.  Risks and benefits were explained. Questions were encouraged and answered.      Timeout Verification Completed including:    Correct patient: Mazi A Seng was identified    Correct procedure    Correct site & side    Correct equipment and supplies    Staff member: Molly Register [name]     Assistant: Roxana Hires    The aspiration/injection site (superolateral) of the left knee was prepped with Chlora-Prep using aseptic technique and aspiration and intra-articular injection was performed with ethyl chloride and 1% lidocaine (2 ml) for anesthetic.      ARTHROCENTESIS FLUID:   60 ml of slightly blood-tinged fluid was aspirated    INJECTION:   SYNVISC (2 ml) #1 of a series of three injections was injected.       A sterile adhesive dressing was applied.    Post procedure:  The patient tolerated the treatment well.  Instructions to patient:  Appropriate post injections instructions were given to the patient.    Assessment:     Molly [name] is a 54 y.o. year old female with the following diagnosis:    1. Left knee DJD  SYNVISC INJ (For Auth/Precert)    PR ARTHROCENTESIS ASPIR&/INJ MAJOR JT/BURSA W/O [place]   2. Knee effusion, left     3. Chondromalacia patellae of left knee     4. Acquired valgus deformity knee     5. Tear of lateral meniscus of left knee     6. Tear of medial meniscus of left knee     7. Morbid obesity with BMI of 40.0-44.9, adult (HCC)     8. S/P left ACL reconstruction 1991 by Dr. Henrene Hawking         Her overall course: Worsening    Plan:       I discussed the diagnosis and the treatment options with Molly [name] today.   In Summary:  1). The various treatment options were outlined and discussed with Molly [name] including:      [x]  Conservative care options:      physical therapy, ice, NSAIDs, bracing, and activity modification       [x]  The indications for therapeutic injections Steroids and Hyluronic Acid/Synvisc/Euflexxa/Supartz      [x]  The indications for (possible future) operative intervention total knee replacement in her lifetime    2). After considering the various options discussed, Molly [name] elected to pursue a course of treatment that includes the following:      [x]  MEDICATIONS/REFILLS prescribed today are listed below.       Ultram    [  x] Physical Therapy/HEP Activities as tolerated       []  Script: 2 x per week x 4 weeks    [x]  Ice to affected area: 15-20 minutes 4 x day    [x]  Aspiration and Synvisc #1 out of a series of 3 injections today into her left knee     Return to Clinic/Follow - Up:  Molly [name] was asked to make a follow-up appointment:    [x]  next week for the second and the following week for the third injection of Synvisc series a 3 into her left knee    Molly [name] was instructed to call the office if her symptoms worsen or if new symptoms appear prior to the next scheduled visit. She is specifically instructed to contact the office between now & her scheduled appointment if she has concerns related to her condition or if she needs assistance in scheduling the above tests. She is welcome to call for an appointment sooner if she has any additional concerns or questions.          Patient Education Materials Provided:    Patient Instructions     Molly [name] was instructed to apply ice to the injection area for 15 - 20 minutes several times a day to decrease pain and and swelling.     Ice ("ICE IS YOUR FRIEND": try using a bag of frozen peas or corn) for 15 - 20 minutes 3 x day.    Limit activities today.  You  may resume normal activities tomorrow if you have no pain.    For severe pain:  If after hours, she is to go to Emergency Room.  During office hours she must come in to the office.  Molly [name] was instructed to call the office if there are any questions or concerns related to her condition.     I have asked her to schedule a follow-up appointment for 1 week from now for re-evaluation and possible repeat injection.    She is specifically instructed to contact the office between now & her scheduled appointment if she has concerns related to her condition. She is welcome to call for an appointment sooner if she has any additional concerns or questions.    General Medication Instructions:  Any prescriptions must be used as directed.  If you have any concerns, questions or require refills, please contact our office.  If you experience any adverse reactions, stop the medication and call our office immediately.    If you designate a preferred pharmacy, appropriate prescriptions will be sent to your preferred pharmacy for pickup to be use as directed.    Patient Driving Instructions:  No driving if you are taking narcotic pain medications or muscle relaxers.    PATIENT REMINDER:   Carry a list of your medications and allergies with you at all times.  Call your pharmacy and our office at least 1 week in advance to refill prescriptions.  Narcotic medications will not be refilled after hours or on weekends.         ATTENTION    As of May 23, 2013, the [place] [employer]rug Enforcement Agency (DEA) has mandated that ALL PRESCRIPTION PAIN MEDICINE cannot be called into your pharmacy.    This includes:    Oxycodone (Percocet)  Hydrocodone (Vicodin, Norco)  Tramadol (Ultram)  Other    These medications must have prescriptions which are written and signed by your doctor (Dr. Caralee [name]).  This means that you must  call ahead and come in to the office to pick up the paper prescription and take it to your pharmacy.    We are sorry for  any inconvenience but this is now the law.    Molly [name]  Board Certified Orthopaedic Surgeon  Knee and Shoulder Surgery Specialist    Contact Information:  Clement Sayres(Zoe RedbirdBinne, [phone] Medical Secretary)  [facility] main number: use after hours [phone])              IMPORTANT NARCOTIC INFORMATION: PLEASE READ     [x]  DO NOT share your prescription medication with anyone!  Sharing is illegal.  The prescription dose is based on your age, weight, and health problems.  Sharing your narcotic prescription can lead to accidental death of the individual for which the prescription was not prescribed.  You may not know about his/her addiction problem.     [x]  Always use the same pharmacy when filling your narcotic prescriptions.     [x]  DO NOT mix your narcotic prescription with alcohol.  Mixing the narcotic prescription medication with alcohol causes depressive effects including breathing problems, organ malfunction, and cardiac arrest.     [x]  Always keep your narcotic medication in a locked, secured location.  Keep your medication private.  This is to avoid individuals from taking your medication without your knowledge.  This medication is highly sought after and locking your prescriptions will protect you from being a target of crime.     [x]  DO NOT stock pile your medication.  This also will protect you from being a target of crime.     [x]  Dispose of any unused medications properly.  Do not flush the medications.  Look for appropriate waste collection programs in your community and drug take back events.     [x]  DO NOT drive while taking narcotic pain medication or muscle relaxers.              VITAMIN D3    Dr Molly [name] recommends that you add an over-the-counter supplement of vitamin D3, (at least 2,000 IU daily).    Vitamin D3 is widely available without a prescription at pharmacies and buying clubs ([employer], [employer]) and on-line at sites such as [employer]siAmazon.com. It comes in a variety of formulations (tablets, gelcaps,  liquid) and doses (1,000 IU, 2,000 IU, 4,000 IU, 5,000 IU and even higher). The right dose for most people is 2,000 IU per day but higher doses are sometimes needed.  We have not seen any problems with any of the formulations so we have no reason to recommend a specific brand. You can take your vitamin D3 at any time of the day, with or without food, with or without calcium.  Because vitamin D3 is long acting, if you miss your vitamin D3 on one day you can double up the dose on a later day.                  Orders Placed This Encounter   Procedures   ??? PR ARTHROCENTESIS ASPIR&/INJ MAJOR JT/BURSA W/O US       Refills/New Prescriptions:  Orders Placed This Encounter   Medications   ??? traMADol (ULTRAM) 50 MG TABS     Sig: Take 50 mg by mouth every 6 hours as needed Take one tab by mouth every 6 hours for pain as needed.     Dispense:  30 tablet     Refill:  0     Today's prescription medications will be e-scribed (when  appropriate) to the Patient's Preferred Pharmacy:   [facility] [address], [place] - [address] - P [phone] Carmon Ginsberg [phone]  [address]  [place] [address]  Phone: [phone] Fax: [phone]    [facility] DELIVERY - [place], [place] - [address] Kris Hartmann [phone] Carmon Ginsberg [phone]  [address]  [place] [address]  Phone: [phone] Fax: [phone]         Molly Register [name]  Board Certified Orthopaedic Surgeon  Knee and Shoulder Surgery Specialist  Electronically signed by Molly Register [name] on 02/28/2014 at 1:42 PM     Contact Information:  Clement Sayres [place], [phone] Medical Secretary)  [facility] main number: use after hours [phone])    This dictation was performed with a verbal recognition program (DRAGON) and it was checked for errors.  It is possible that there are still dictated errors within this office note.  If so, please bring any errors to my attention for an addendum.  All efforts were made to ensure that this office note is accurate.

## 2014-02-28 NOTE — Telephone Encounter (Signed)
lmom to check on patient status after appointment.

## 2014-03-03 NOTE — Other (Signed)
Physical Therapy Daily Treatment Note  Date:  03/03/2014    Patient Name:  Molly [name]    DOB:  1961/04/20  MRN: [id number]  Restrictions/Precautions:   Medical/Treatment Diagnosis Information:   ?? Diagnosis: L knee OA, DJD, chondromalacia  ?? Treatment Diagnosis: L knee pain, decreased ROM and strength  Insurance/Certification information:  PT Insurance Information: PT BENEFITS 2015/ [employer]/ DONNA/ EFFECTIVE 10-28-12/ ACTIVE/ 1200 DED MET/ PAYS 90%/ 3000 OOP/30 VPCY/ REF# DONNA 02-07-14 10:55/ PAG    Physician Information:  Referring Practitioner: Molly Rhine, MD  Plan of care signed (Y/N):   Visit# / total visits:  4/30  Pain level: 3/10     G-Code (if applicable):      Date / Visit # G-Code Applied:  03-07-2014 visit #1  PT G-Codes  Functional Assessment Tool Used: LEFS  Score: 30/80=37.5% (62.5% deficit)  Functional Limitation: Mobility: Walking and moving around  Mobility: Walking and Moving Around Current Status (P7106): At least 60 percent but less than 80 percent impaired, limited or restricted  Mobility: Walking and Moving Around Goal Status [phone]): At least 1 percent but less than 20 percent impaired, limited or restricted    Progress Note: _0   Yes  _1   No  Next due by: week of 03/12/14      Subjective: Saw Dr. Rosana [name] on Friday, she aspirated 60cc of fluid and was given first of of three Synvisc injections. States the knee was pretty painful on Friday and Saturday, did not do exercises, was not able to work over the weekend. Had to use a cane and today was the first day without a cane. Has not been able to walk the dog. Was able to vacuum today. Painful on the outside of the knee. Groin pain has improved, bothers her only occasionally and only for a few seconds. Feels like the knee is a little more unstable.    Objective:  Observation:  ?? Significant knee genu valgum in standing  ?? Quad Recruitment: fair - on L; fair + on R  ?? Effusion: medial and lateral aspects of infra patella, "pocket" of  fluid, seen bilaterally  ?? Palpation: + joint lines  ?? Gait: Decreased step length, increased lateral sway.  ?? Patella mobility: good  Flexibility  L  R  Comment    Hamstring  SLR 70 degrees  SLR 80 degrees     Gastroc  Mild tightness  Mild tightness     Quad  Mod tightness  - tightness       ROM  PROM  AROM  Overpressure  Comment     L  R  L  R  L  R     Flexion  122  134  117  129       Extension  -10  -3  -8  0         Strength  L  R  Comment    Quad  4-/5  4/5     Hamstring  4-/5  4/5     Gastroc  4+/5  4+/5     Hip flex  4/5  4/5     Glute max  4-/5  4-/5     Glute med  4-/5  4-/5     Glute min  4-/5  4-/5       Special Test  Results/Comment    Meniscal Click  -    Crepitus  +    Valgus Laxity  -    Varus  Laxity  -      Girth  L  R    Mid Patella      Suprapatellar      5cm above      15cm above            Exercises:  Exercise/Equipment Resistance/Repetitions Other comments   Stretching     Hamstring 5x30"    Hip Flexion     ITB     Grion Quad     Inclined Calf     Towel Pull 5x30"         SLR     Supine 3x10    Prone     Abduction 3x10    Adducton     SLR+          Isometrics     Quad sets    Adduction         Patellar Glides     Medial     Superior     Inferior          ROM     Passive     Active     Weight Shift     Hang Weights     Sheet Pulls     Ankle Pumps          CKC     Calf raises 3x15    Wall sits     Step ups/over     1 leg stand     Squatting     CC TKE 3x15 black   Balance 3x30" tandem bilateral        PRE     Extension 3x10 10# RANGE:   Flexion 3x10 25# RANGE:        Cable Column          Leg Press  RANGE:        Bike     Treadmill            Other Therapeutic Activities: N/A    Home Exercise Program:   Initiated 02/12/14. Printed hand out given. Pt demonstrated proper form of each exercise and expressed verbal understanding of frequency and duration.     Patient Education:      02/12/14 Pt educated on diagnosis, prognosis and PT plan of care. Educated on [place]. Pt questions were addressed and  answered.  03/03/14 Pt educated on used of home EMPI unit.  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  A muscle stim device with conductive garment is being prescribed to facilitate strengthening of their quad contraction.  This is in accordance with the therapist's established rehabilitation plan to treat quad atrophy.    Molly [name], PT    Timed Code Treatment Minutes: 38 (TEx23 TAx15)    Total Treatment Minutes: 79    Assessment:  _12  Patient tolerated treatment well _13  Patient limited by fatigue  _14  Patient limited by pain  _15  Patient limited by other medical complications  <YTMMITVIFXGXIVHS>_9<\/WTGRMBOBOFPULGSP>_32  Other: Quad recruitment improved today compared to LPV in that she was able to maintain quad contraction during supine SLR; however, extension lag persists due to weakness  secondary residual effects of knee OA. Difficulty with L1 (4in) step downs, struggles with eccentric quad control. Pt requires continued PT to address ROM and strength deficits.    Plan for Next Session: SAQ, increase weight on PRE flexion    Prognosis: _0  Good _1  Fair  _2  Poor    Patient Requires Follow-up: _3  Yes  _4  No      Goals:  Short term goals  Time Frame for Short term goals: 2 weeks 02/26/14  Short term goal 1: Independent HEP  Short term goal 2: Decrease pain by 1-2 grades  Short term goal 3: Improve PROM knee flexion by 5-10 degrees  Short term goal 4: Improve AROM knee flexion by 5 degrees or greater    Long term goals  Time Frame for Long term goals : 4-6 weeks 03/12/14-03/26/14  Long term goal 1: PROM 0-130 degrees or greater  Long term goal 2: AROM 0-125 degrees or greater  Long term goal 3: Pain free at rest; pain 0-2/10 with ADLs  Long term goal 4: BLUE grossly  4/5 or greater  Long term goal 5: Improve LEFS score to reflect 1-20% deficit      Plan:   _5  Continue per plan of care _6  Alter current plan (see comments)  _7  Plan of care initiated _8  Hold pending MD visit _9  Discharge    MD Follow-up:     Electronically signed by:  Shela Leff, DPT

## 2014-03-04 MED ORDER — VITAMIN D (ERGOCALCIFEROL) 1.25 MG (50000 UT) PO CAPS
1.25 MG (50000 UT) | ORAL_CAPSULE | ORAL | Status: DC
Start: 2014-03-04 — End: 2014-09-23

## 2014-03-04 MED ORDER — CYMBALTA 60 MG PO CPEP
60 MG | ORAL_CAPSULE | Freq: Every day | ORAL | Status: DC
Start: 2014-03-04 — End: 2014-03-07

## 2014-03-04 MED ORDER — CYMBALTA 30 MG PO CPEP
30 MG | ORAL_CAPSULE | Freq: Every day | ORAL | Status: DC
Start: 2014-03-04 — End: 2014-04-22

## 2014-03-04 NOTE — Patient Instructions (Addendum)
1.Continue with current medications as indicated      2. Check laboratory studies today to evaluate for any evidence of drug toxicity or side effects      3. Return visit for followup in 4 months or sooner if needed    Lab Results   Component Value Date    WBC 7.6 11/29/2013    HGB 14.4 11/29/2013    HCT 43.1 11/29/2013    MCV 96.9 11/29/2013    PLT 342 11/29/2013       Chemistry        Component Value Date/Time    NA 141 11/29/2013 2244    K 4.5 11/29/2013 2244    CL 99 11/29/2013 2244    CO2 26 11/29/2013 2244    BUN 13 11/29/2013 2244    CREATININE 0.6 11/29/2013 2244        Component Value Date/Time    CALCIUM 9.9 11/29/2013 2244    ALKPHOS 98 11/29/2013 2244    AST 20 11/29/2013 2244    ALT 15 11/29/2013 2244    BILITOT 0.4 11/29/2013 2244          Lab Results   Component Value Date    SEDRATE 22 11/29/2013     Lab Results   Component Value Date    CRP 5.1 11/29/2013     4.Cymbalta instructions:    Take Cymbalta 30 mg daily for one week. If well tolerated, then take Cymbalta 60 mg daily.      Please call with any questions or concerns        duloxetine  Pronunciation: du LOX e teen  Brand: Cymbalta  What is duloxetine?  Duloxetine is a selective serotonin and norepinephrine reuptake inhibitor antidepressant (SSNRI). Duloxetine affects chemicals in the brain that may become unbalanced and cause depression.  Duloxetine is used to treat major depressive disorder in adults. Duloxetine is also used to treat general anxiety disorder in adults and children who are at least 13 years old.  Duloxetine is also used in adults to treat fibromyalgia (a chronic pain disorder), or chronic muscle or joint pain (such as low back pain and osteoarthritis pain).  Duloxetine is also used to treat pain caused by nerve damage in adults with diabetes (diabetic neuropathy).  Duloxetine may also be used for purposes not listed in this medication guide.  What should I discuss with my healthcare provider before taking duloxetine?  You should not use this  duloxetine if you are allergic to it, or if you have untreated or uncontrolled glaucoma.  Do not use duloxetine if you have used an MAO inhibitor in the past 14 days. A dangerous drug interaction could occur. MAO inhibitors include isocarboxazid, linezolid, methylene blue injection, phenelzine, rasagiline, selegiline, tranylcypromine, and others. After you stop taking duloxetine, you must wait at least 5 days before you start taking an MAOI.  To make sure duloxetine is safe for you, tell your doctor if you have:  ?? liver or kidney disease;  ?? seizures or epilepsy;  ?? a bleeding or blood clotting disorder;  ?? high blood pressure;  ?? narrow-angle glaucoma;  ?? bipolar disorder (manic depression); or  ?? a history of drug abuse or suicidal thoughts.  Some young people have thoughts about suicide when first taking an antidepressant. Your doctor will need to check your progress at regular visits while you are using duloxetine. Your family or other caregivers should also be alert to changes in your mood or symptoms.  FDA pregnancy category  C. It is not known whether duloxetine will harm an unborn baby. However, duloxetine may cause problems in a newborn if you take the medicine during the third trimester of pregnancy. Tell your doctor if you are pregnant or plan to become pregnant while using this medicine.  If you are pregnant, your name may be listed on a pregnancy registry. This is to track the outcome of the pregnancy and to evaluate any effects of duloxetine on the baby.  Duloxetine can pass into breast milk and may harm a nursing baby. You should not breast-feed while taking this medicine.  Do not give this medicine to anyone younger than 52 years old without the advice of a doctor.  How should I take duloxetine?  Follow all directions on your prescription label. Do not take this medicine in larger or smaller amounts or for longer than recommended.  Try to take the medicine at the same time each day. Follow the  directions on your prescription label.  Do not crush, chew, break, or open an extended-release capsule. Swallow it whole.  It may take 4 weeks or longer before your symptoms improve. Keep using the medication as directed. Do not stop using duloxetine without first talking to your doctor. You may have unpleasant side effects if you stop taking this medicine suddenly.  Store at room temperature away from moisture and heat.  What happens if I miss a dose?  Take the missed dose as soon as you remember. Skip the missed dose if it is almost time for your next scheduled dose. Do not  take extra medicine to make up the missed dose.  What happens if I overdose?  Seek emergency medical attention or call the Poison Help line at [phone].  What should I avoid while taking duloxetine?  Avoid drinking alcohol. It may increase your risk of liver damage.  Ask your doctor before taking a nonsteroidal anti-inflammatory drug (NSAID) for pain, arthritis, fever, or swelling. This includes aspirin, ibuprofen (Advil, Motrin), naproxen (Aleve), celecoxib (Celebrex), diclofenac, indomethacin, meloxicam, and others. Using an NSAID with duloxetine may cause you to bruise or bleed easily.  This medicine may impair your thinking or reactions. Be careful if you drive or do anything that requires you to be alert. Avoid getting up too fast from a sitting or lying position, or you may feel dizzy. Get up slowly and steady yourself to prevent a fall.  Severe dizziness or fainting can cause falls, accidents, or severe injuries.  What are the possible side effects of duloxetine?  Get emergency medical help if you have any of these signs of an allergic reaction: skin rash or hives; difficulty breathing; swelling of your face, lips, tongue, or throat.  Report any new or worsening symptoms to your doctor, such as: mood or behavior changes, anxiety, panic attacks, trouble sleeping, or if you feel impulsive, irritable, agitated, hostile, aggressive,  restless, hyperactive (mentally or physically), more depressed, or have thoughts about suicide or hurting yourself.  Call your doctor at once if you have:  ?? a light-headed feeling, like you might pass out;  ?? blurred vision, tunnel vision, eye pain or swelling, or seeing halos around lights;  ?? easy bruising, unusual bleeding;  ?? painful or difficult urination;  ?? liver problems --nausea, upper stomach pain, itching, tired feeling, loss of appetite, dark urine, clay-colored stools, jaundice (yellowing of the skin or eyes);  ?? high levels of serotonin in the body --agitation, hallucinations, fever, fast heart rate, overactive reflexes, nausea, vomiting, diarrhea,  loss of coordination, fainting;  ?? low levels of sodium in the body --headache, confusion, slurred speech, severe weakness, vomiting, loss of coordination, feeling unsteady;  ?? severe nervous system reaction --very stiff (rigid) muscles, high fever, sweating, confusion, fast or uneven heartbeats, tremors; or  ?? severe skin reaction --fever, sore throat, swelling in your face or tongue, burning in your eyes, skin pain, followed by a red or purple skin rash that spreads (especially in the face or upper body) and causes blistering and peeling.  Older adults may be more sensitive to the side effects of this medicine.  Common side effects may include:  ?? vision changes;  ?? dry mouth;  ?? drowsiness, dizziness;  ?? tired feeling;  ?? loss of appetite; or  ?? increased sweating.  This is not a complete list of side effects and others may occur. Call your doctor for medical advice about side effects. You may report side effects to FDA at 1-800-FDA-1088.  What other drugs will affect duloxetine?  Taking this medicine with other drugs that make you sleepy can worsen this effect. Ask your doctor before taking duloxetine with a sleeping pill, narcotic pain medicine, muscle relaxer, or medicine for anxiety, depression, or seizures.  Many drugs can interact with duloxetine.  Not all possible interactions are listed here. Tell your doctor about all your medications and any you start or stop using during treatment with duloxetine, especially:  ?? any other antidepressant;  ?? cimetidine;  ?? St. John's wort;  ?? theophylline;  ?? tryptophan (sometimes called L-tryptophan);  ?? an antibiotic --ciprofloxacin, enoxacin;  ?? a blood thinner --warfarin, Coumadin, Jantoven;  ?? heart rhythm medication --flecainide, propafenone, quinidine, and others;  ?? narcotic pain medicine --fentanyl, tramadol;  ?? medicine to treat mood disorders, thought disorders, or mental illness --buspirone, lithium, and many others; or  ?? migraine headache medicine --sumatriptan, rizatriptan, zolmitriptan, and others.  This list is not complete and many other drugs can interact with duloxetine. This includes prescription and over-the-counter medicines, vitamins, and herbal products. Give a list of all your medicines to any healthcare provider who treats you.  Where can I get more information?  Your pharmacist can provide more information about duloxetine.    Remember, keep this and all other medicines out of the reach of children, never share your medicines with others, and use this medication only for the indication prescribed.   Every effort has been made to ensure that the information provided by [employer] ('[employer]') is accurate, up-to-date, and complete, but no guarantee is made to that effect. Drug information contained herein may be time sensitive. [employer] information has been compiled for use by healthcare practitioners and consumers in the [place] and therefore [employer] does not warrant that uses outside of the [place] are appropriate, unless specifically indicated otherwise. [employer]'s drug information does not endorse drugs, diagnose patients or recommend therapy. [employer]'s drug information is an Investment banker, corporateinformational resource designed to assist licensed healthcare practitioners in caring for their patients  and/or to serve consumers viewing this service as a supplement to, and not a substitute for, the expertise, skill, knowledge and judgment of healthcare practitioners. The absence of a warning for a given drug or drug combination in no way should be construed to indicate that the drug or drug combination is safe, effective or appropriate for any given patient. Mult[employer] does not assume any responsibility for any aspect of healthcare administered with the aid of information [employer] provides. The information contained herein is not intended  to cover all possible uses, directions, precautions, warnings, drug interactions, allergic reactions, or adverse effects. If you have questions about the drugs you are taking, check with your doctor, nurse or pharmacist.  Copyright [id number] [employer] Version: 10.03. Revision date: 08/05/2013.  This information does not replace the advice of a doctor. [employer]orporated disclaims any warranty or liability for your use of this information.   Content Version: 10.5.422740

## 2014-03-04 NOTE — Telephone Encounter (Signed)
lmom to see if she wanted to come in July 17 at [facility]

## 2014-03-04 NOTE — Progress Notes (Signed)
[facility]  [facility]  Rheumatology Progress Note  Harley Hallmark, MD      Arly.Keller ] [facility]  [   ] [facility]              [address]            [address]              [place], [place] [address]               [address]                   [place] [address]            Phone:[phone]            Fax: [phone]             Patient Name:  Molly [name] is a 53 y.o. patient     Returns today for followup. Since last seen, she has started Plaquenil and Relafen and Voltaren gel. Has been doing well on these medications and has noticed much improvement in her peripheral joints with the exception of her left knee. The left knee she has been following up with Dr. Caralee Ates. She has received corticosteroid injections and recently received Synvisc injection. However she believes that surgery may be in the near future. She denies having any mouth sores or ulcers, skin rashes. Over the last couple of days she's been having severe back pain making standing for long periods difficult. DEXA scan that was done was found to be within normal limits.      Past medical/surgical history, medications and allergies are reviewed and updated as appropriate.    Allergies   Allergen Reactions   ??? Sulfa Antibiotics Rash       Past Medical History:        Diagnosis Date   ??? Inflammatory arthritis (HCC)      see Dr Lehman Prom   ??? History of non-Hodgkin's lymphoma 2006     treated with radiation and chemo   ??? Obesity    ??? Diabetes mellitus (HCC)    ??? Cancer (HCC)    ??? Amenorrhea        Past Surgical History:        Procedure Laterality Date   ??? Leep       in 1990's   ??? Knee surgery       ACL and meniscus repair       Medications :    Prior to Admission medications    Medication Sig Start Date End Date Taking? Authorizing Provider   DULoxetine (CYMBALTA) 30 MG CPEP Take 30 mg by mouth daily for 7 days 03/04/14 03/11/14 Yes Harley Hallmark, MD   DULoxetine (CYMBALTA) 60 MG CPEP Take 60  mg by mouth daily 03/04/14  Yes Harley Hallmark, MD   traMADol (ULTRAM) 50 MG TABS Take 50 mg by mouth every 6 hours as needed Take one tab by mouth every 6 hours for pain as needed. 02/28/14  Yes Lisabeth Register, MD   diclofenac sodium (VOLTAREN) 1 % GEL Below the waist apply 4 gram 4 times a day 02/07/14  Yes Lisabeth Register, MD   nabumetone (RELAFEN) 750 MG tablet Take 1 tablet by mouth 2 times daily 01/07/14  Yes Harley Hallmark, MD   hydroxychloroquine (PLAQUENIL) 200 MG tablet Take 1 tablet by mouth 2 times daily 01/07/14  Yes Harley Hallmark, MD   Cholecalciferol (VITAMIN  D3) 2000 UNITS CAPS Take  by mouth.   Yes Historical Provider, MD         Review of Systems:    GENERAL:Denies having any fatigue or unintentional weight loss  HEENT:  Denies having any  Oral lesions, Or sores  LUNGS:Denies having had any new breathing difficulties  ZO:XWRUEAGI:Denies having any GI upset from medications  MUSCULOSKELETAL:Left knee pain  SKIN: denies having any new skin lesions since last office visit  LYMPHADENOPATHY: denies having had any recent infectious illness or lymphadenopathy      Physical Exam:    BP 120/86    Pulse 84    Ht 5\' 10"  (1.778 m)    Wt 299 lb 1.6 oz (135.671 kg)    BMI 42.92 kg/m2       GENERAL:Able to ambulate without any assistance.  HEENT: no evidence of any oral ulcers, lesions or sores  LUNGS:clear to auscultation bilaterally, good air intake  CARDIOVASCULAR:regular rate  MUSCULOSKELETAL:Warmth, swelling, tenderness involving the left knee. Otherwise no evidence of synovitis swelling warmth or tenderness of patient's upper or lower peripheral joints.  Skin:Skin is warm and dry. No Petechiae, telangiectasia, purpura, or rash. Palpation of the skin and subcutaneous tissues reveals no evidence of induration, subcutaneous nodule or tenderness.   Lymphadenopathy: no evidence of any palpable cervical or supraclavicular lymph nodes    Labs:     Lab Results   Component Value Date    WBC 7.6 11/29/2013    HGB 14.4 11/29/2013    HCT 43.1  11/29/2013    MCV 96.9 11/29/2013    PLT 342 11/29/2013       Chemistry        Component Value Date/Time    NA 141 11/29/2013 2244    K 4.5 11/29/2013 2244    CL 99 11/29/2013 2244    CO2 26 11/29/2013 2244    BUN 13 11/29/2013 2244    CREATININE 0.6 11/29/2013 2244        Component Value Date/Time    CALCIUM 9.9 11/29/2013 2244    ALKPHOS 98 11/29/2013 2244    AST 20 11/29/2013 2244    ALT 15 11/29/2013 2244    BILITOT 0.4 11/29/2013 2244          Lab Results   Component Value Date    SEDRATE 22 11/29/2013     Lab Results   Component Value Date    ANA POSITIVE 11/29/2013    C3 153.4 11/29/2013    C4 28.3 11/29/2013           Assessment/Plan:    Assessment/Plan:    Assessment/Plan:  Molly [name] was seen today for arthritis.    Diagnoses and associated orders for this visit:    Systemic lupus (HCC)-Found to have an elevated ANA and double-stranded DNA with a borderline positive rheumatoid factor titer.With improvement and currently stable on Plaquenil 200 mg taken twice daily.Advised her that when she's feeling better on the Cymbalta to start using the Relafen on a p.r.n. Basis.  - CBC Auto Differential  - Comprehensive Metabolic Panel  - Anti-DNA Antibody, Double-Stranded  - C3 Complement  - C4 Complement  - ANA  - Urinalysis  - Sedimentation Rate    Osteoarthritis of knee-Currently the biggest problem that the patient is facing. May also be looking at upcoming knee surgery.    - DULoxetine (CYMBALTA) 30 MG CPEP; Take 30 mg by mouth daily for 7 days  - DULoxetine (CYMBALTA) 60 MG CPEP; Take 60 mg  by mouth daily.Risks, side effects and warning signs were fully discussed with patient.  Reading information was given to patient to review.      Return for 6-8 weeks.    Letitia Libra  [facility]  [facility]  [address]  [place], [place] [address]  Phone:[phone]  Fax:[phone]    NOTE: This report is transcribed by using voice recognition software dragon. Every effort is made to ensure accuracy;  however, inadvertent computerized transcription errors may be present.           Harley Hallmark, MD, 03/04/2014 5:01 PM

## 2014-03-05 LAB — CBC WITH AUTO DIFFERENTIAL
Basophils %: 0.4 %
Basophils Absolute: 0 10*3/uL (ref 0.0–0.2)
Eosinophils %: 2.5 %
Eosinophils Absolute: 0.2 10*3/uL (ref 0.0–0.6)
Hematocrit: 42.7 % (ref 36.0–48.0)
Hemoglobin: 13.8 g/dL (ref 12.0–16.0)
Lymphocytes %: 25 %
Lymphocytes Absolute: 1.6 10*3/uL (ref 1.0–5.1)
MCH: 31.2 pg (ref 26.0–34.0)
MCHC: 32.4 g/dL (ref 31.0–36.0)
MCV: 96.4 fL (ref 80.0–100.0)
MPV: 7.9 fL (ref 5.0–10.5)
Monocytes %: 13.5 %
Monocytes Absolute: 0.9 10*3/uL (ref 0.0–1.3)
Neutrophils %: 58.6 %
Neutrophils Absolute: 3.8 10*3/uL (ref 1.7–7.7)
Platelets: 314 10*3/uL (ref 135–450)
RBC: 4.43 M/uL (ref 4.00–5.20)
RDW: 13.5 % (ref 12.4–15.4)
WBC: 6.5 10*3/uL (ref 4.0–11.0)

## 2014-03-05 LAB — URINALYSIS
Blood, Urine: NEGATIVE
Glucose, Ur: NEGATIVE mg/dL
Leukocyte Esterase, Urine: NEGATIVE
Nitrite, Urine: NEGATIVE
Protein, UA: NEGATIVE mg/dL
Specific Gravity, UA: 1.027 (ref 1.005–1.030)
Urobilinogen, Urine: 0.2 E.U./dL (ref <2.0)
pH, UA: 6 (ref 5.0–8.0)

## 2014-03-05 LAB — COMPREHENSIVE METABOLIC PANEL
ALT: 15 U/L (ref 10–40)
AST: 17 U/L (ref 15–37)
Albumin/Globulin Ratio: 1.3 (ref 1.1–2.2)
Albumin: 4.7 g/dL (ref 3.4–5.0)
Alkaline Phosphatase: 94 U/L (ref 40–129)
Anion Gap: 14 (ref 3–16)
BUN: 15 mg/dL (ref 7–20)
CO2: 26 mmol/L (ref 21–32)
Calcium: 10 mg/dL (ref 8.3–10.6)
Chloride: 98 mmol/L — ABNORMAL LOW (ref 99–110)
Creatinine: 0.6 mg/dL (ref 0.6–1.1)
GFR African American: >60 (ref >60)
GFR Non-African American: >60 (ref >60)
Globulin: 3.5 g/dL
Glucose: 92 mg/dL (ref 70–99)
Potassium: 5 mmol/L (ref 3.5–5.1)
Sodium: 138 mmol/L (ref 136–145)
Total Bilirubin: 0.4 mg/dL (ref 0.0–1.0)
Total Protein: 8.2 g/dL (ref 6.4–8.2)

## 2014-03-05 LAB — ANA
ANA Titer: 1:80 {titer}
ANA: POSITIVE — AB

## 2014-03-05 LAB — C4 COMPLEMENT: C4 Complement: 30.1 mg/dL (ref 10.0–40.0)

## 2014-03-05 LAB — SEDIMENTATION RATE: Sed Rate: 21 mm/Hr (ref 0–30)

## 2014-03-05 LAB — C3 COMPLEMENT: C3 Complement: 172.3 mg/dL (ref 90.0–180.0)

## 2014-03-05 NOTE — Other (Signed)
Physical Therapy Daily Treatment Note  Date:  03/05/2014    Patient Name:  Molly [name]    DOB:  [birth year]  MRN: [id number]  Restrictions/Precautions:   Medical/Treatment Diagnosis Information:   ?? Diagnosis: L knee OA, DJD, chondromalacia  ?? Treatment Diagnosis: L knee pain, decreased ROM and strength  Insurance/Certification information:  PT Insurance Information: PT BENEFITS 2015/ NGS [employer]/ DONNA/ EFFECTIVE 10-28-12/ ACTIVE/ 1200 DED MET/ PAYS 90%/ 3000 OOP/30 VPCY/ REF# DONNA 02-07-14 10:55/ PAG    Physician Information:  Referring Practitioner: Zadie Rhine, MD  Plan of care signed (Y/N):   Visit# / total visits:  5/30  Pain level: 3/10     G-Code (if applicable):      Date / Visit # G-Code Applied:  02-27-2014 visit #1  PT G-Codes  Functional Assessment Tool Used: LEFS  Score: 30/80=37.5% (62.5% deficit)  Functional Limitation: Mobility: Walking and moving around  Mobility: Walking and Moving Around Current Status ([id number]): At least 60 percent but less than 80 percent impaired, limited or restricted  Mobility: Walking and Moving Around Goal Status [phone]): At least 1 percent but less than 20 percent impaired, limited or restricted    Progress Note: []   Yes  [x]   No  Next due by: week of 03/12/14      Subjective: Knee is feeling better, seems to feel stronger, feels a little better each day. See Dr. Rosana Berger on Friday for 2nd Synvisc injection.    Objective:  Observation:  ?? Significant knee genu valgum in standing  ?? Quad Recruitment: fair - on L; fair + on R  ?? Effusion: medial and lateral aspects of infra patella, "pocket" of fluid, seen bilaterally  ?? Palpation: + joint lines  ?? Gait: Decreased step length, increased lateral sway.  ?? Patella mobility: good  Flexibility  L  R  Comment    Hamstring  SLR 70 degrees  SLR 80 degrees     Gastroc  Mild tightness  Mild tightness     Quad  Mod tightness  - tightness       ROM  PROM  AROM  Overpressure  Comment     L  R  L  R  L  R     Flexion  122  134  117   129       Extension  -10  -3  -8  0         Strength  L  R  Comment    Quad  4-/5  4/5     Hamstring  4-/5  4/5     Gastroc  4+/5  4+/5     Hip flex  4/5  4/5     Glute max  4-/5  4-/5     Glute med  4-/5  4-/5     Glute min  4-/5  4-/5       Special Test  Results/Comment    Meniscal Click  -    Crepitus  +    Valgus Laxity  -    Varus Laxity  -      Girth  L  R    Mid Patella      Suprapatellar      5cm above      15cm above            Exercises:  Exercise/Equipment Resistance/Repetitions Other comments   Stretching     Hamstring 5x30" prop   Hip Flexion     ITB  Grion Quad     Inclined Calf 3x30"    Towel Pull          SLR     Supine 3x10    Prone     Abduction 3x10    Adducton     SLR+          Isometrics     Quad sets    Adduction         Patellar Glides     Medial     Superior     Inferior          ROM     Passive     Active     Weight Shift     Hang Weights     Sheet Pulls     Ankle Pumps          CKC     Calf raises 3x15    Wall sits     Step ups/over 3x10 L1    1 leg stand     Squatting     CC TKE 3x15 black   Balance 3x30" tandem bilateral        PRE     Extension 3x10 10#  3x10 SAQ 0# RANGE:   Flexion 3x10 30# RANGE:        Cable Column          Leg Press  RANGE:        Bike     Treadmill            Other Therapeutic Activities: N/A    Home Exercise Program:   Initiated 02/12/14. Printed hand out given. Pt demonstrated proper form of each exercise and expressed verbal understanding of frequency and duration.     Patient Education:      02/12/14 Pt educated on diagnosis, prognosis and PT plan of care. Educated on [place]. Pt questions were addressed and answered.  03/03/14 Pt educated on used of home EMPI unit.  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  A muscle stim device with conductive garment is being prescribed to facilitate strengthening of their quad contraction.  This is in accordance with the therapist's established rehabilitation plan to treat quad atrophy.    Colonel Bald, PT    Timed Code Treatment Minutes: 28 (TEx13 TAx15) +EMS    Total Treatment Minutes: 75    Assessment:  [x]  Patient tolerated treatment well []  Patient limited by fatigue  []  Patient limited by pain  []  Patient limited by other medical complications  [x]  Other: Continues to struggle with L1 step down, form improved slightly today but requires significant concentration from pt to perform exercise in a slow and controlled manner rather than letting the knee drop. 2/10 pain with step overs, pain does not linger as it use to do; pain free with all other exercises. Pt requires continued PT to address ROM and strength deficits.    Plan for Next Session: Progress as tolerated    Prognosis: []  Good [x]  Fair  []  Poor    Patient Requires Follow-up: [x]  Yes  []  No  Goals:  Short term goals  Time Frame for Short term goals: 2 weeks 02/26/14  Short term goal 1: Independent HEP  Short term goal 2: Decrease pain by 1-2 grades  Short term goal 3: Improve PROM knee flexion by 5-10 degrees  Short term goal 4: Improve AROM knee flexion by 5 degrees or greater    Long term goals  Time Frame for Long term goals : 4-6 weeks 03/12/14-03/26/14  Long term goal 1: PROM 0-130 degrees or greater  Long term goal 2: AROM 0-125 degrees or greater  Long term goal 3: Pain free at rest; pain 0-2/10 with ADLs  Long term goal 4: BLUE grossly 4/5 or greater  Long term goal 5: Improve LEFS score to reflect 1-20% deficit      Plan:   [x]  Continue per plan of care []  Alter current plan (see comments)  []  Plan of care initiated []  Hold pending MD visit []  Discharge    MD Follow-up:     Electronically signed by:  Shela Leff, DPT

## 2014-03-06 LAB — ANTI-DNA ANTIBODY, DOUBLE-STRANDED: Double Stranded Dna Ab, Igg: DETECTED — AB

## 2014-03-07 LAB — ANTI-DNA AB, DOUBLE-STRANDED IGG TITER: dsDNA Ab Titer: 1:160 {titer} — ABNORMAL HIGH

## 2014-03-07 NOTE — Progress Notes (Signed)
SYNVISC 3 SHOT     NDC:5846-0090-01    LOT# [id number]  EXP. DATE 06/2016  SITE: Oswaldo Milian Knee     LIDOCAINE  NDC: 1610-9604-54  LOT: [id number]  EXP: 04/23/2015    We provided the Synvisc medication.  This is injection #2 of 3.

## 2014-03-07 NOTE — Progress Notes (Signed)
Lisabeth Register, MD  Orthopaedic Surgery & Sports Medicine  Knee & Shoulder Specialist    []   [place] OFFICE    [x]   [facility] OFFICE  [address]                    [address]  [place], [place]  [address]                   [place], [place]  [address]  [phone]                     [phone]  [phone] (fax)    [phone] (fax)      PATIENT: Molly [name]    53 y.o.  female  DATE OF BIRTH: 03-27-1961   MRN:  [id number]       Date of current encounter: 03/07/2014  This encounter is evaluated as a:       []  New Patient Visit    [x]  Established Patient Visit   []  Post-Op Visit     []  Consult: requested by         []  Worker's Comp       Patient's PCP is Dr. Merla Riches, MD    Subjective:     Reason for Visit: left knee injection: Synvisc #2 out of a series of 3 injections    Chief Complaint   Patient presents with   ??? Knee Pain     Left Knee Synvisc #2 injection       HPI:  Molly [name] is a 53 y.o. year old, Caucasian female complaining of left knee pain.     PAIN ASSESSMENT:   Molly [name] has a pain level on 0/10 scale:  8 out of 10 with activity and 0 out of 10 at rest. Her pain is intermittent and occurs intermittently.  When the pain occurs, it lasts seconds.  She reports that rest and meds makes her pain better and certain movements and too much activity makes her pain worse. She denies pain at night.  She denies numbness and tingling.  She describes her pain as sharp if she moves the wrong way and sometimes achy if she does too much.  Her pain is located at the anterior lateral aspect of her left knee.  Her pain does not radiate.  She feels that the problem is gradually improving.  The pain is  limiting her behavior. Her last injection was synvisc #1 of 3 on 02/28/14 and she said it is getting better every day.    Patient Active Problem List   Diagnosis   ??? H/O vitamin D deficiency   ??? History of non-Hodgkin's lymphoma   ??? Impaired glucose tolerance   ??? HLD (hyperlipidemia)   ???  Vitamin D deficiency   ??? S/P left ACL reconstruction 1991 by Dr. Henrene Hawking   ??? Chondromalacia patellae of left knee   ??? Tear of lateral meniscus of left knee   ??? Tear of medial meniscus of left knee   ??? Left knee DJD   ??? Morbid obesity with BMI of 40.0-44.9, adult (HCC)   ??? Acquired valgus deformity knee   ??? Knee effusion       Allergies:  Sulfa antibiotics    Medications:  No outpatient prescriptions have been marked as taking for the 03/07/14 encounter (Office Visit) with Lisabeth Register, MD.       Molly Sandy A [name]'s past medical history, family history, social history and review of  systems have been reviewed and are recorded in the chart.    Objective:     Physical Exam  Vital Signs:  BP 131/75    Pulse 87    Ht 5\' 10"  (1.778 m)    Wt 290 lb (131.543 kg)    BMI 41.61 kg/m2       Constitution:  Generally, Molly [name] is [x]  alert, [x]  appears stated age, and [x]  in no distress.  Her general body habitus is []  Cachectic  []  Thin  []  Normal  []  Obese  [x]  Morbidly Obese    Head: [x]  Normocephalic  Eyes: [x]  Extra-occular muscles intact  Left Ear: [x]  External Ear normal   Right Ear: [x]  External Ear normal   Nose: [x]  Normal  Mouth: [x]  Oral mucosa moist  [x]  No perioral lesions    Pulmonary: [x]  Respirations unlabored and regular  Skin: [x]  Warm [x]  Well perfused     Psychiatric:   [x]  Good judgement and insight  [x]  Oriented to [x]  person, [x]  place, and [x]  time.  [x]  Mood appropriate for circumstances.    Gait:   Gait is []  Normal  [x]  Impaired slight limp  Assistive Device: [x]  None  []  Knee Brace  []  Cane  []  Crutches   []  Walker   []  Wheelchair  []  Other     ORTHOPAEDIC KNEE EXAM: LEFT    Inspection:  [x]  Skin intact without abrasion or lacerations.  []  Ecchymosis:  [x]  none  []  mild  []  moderate  []  severe  []  Atrophy:  [x]  none  []  mild  []  moderate  []  severe  []  Effusion: [x]  none  []  mild  []  moderate  []  severe  []  Scar / Surgical incision(s): []  A-Scope Portals  []  Open Surgical  Incision(s)    Alignment:  [x]  Normal  []  Varus []  Valgus    Range of Motion:  []  Normal Knee ROM         []  Deferred: acute injury/post-surgery/pain   [x]  Limited ROM:     Palpation:   []  No Tenderness  [x]  Tenderness: Anterior [x]  mild  []  moderate  []  severe   [x]  Patellofemoral Crepitation:  []  none  [x]  mild  []  moderate  []  severe    Motor Function:   [x]  No gross motor weakness  []  Motor weakness:  []  mild  []  moderate  []  severe     Neurologic:  [x]  Sensation to light touch intact   [x]  Coordination / proprioception intact    Circulation:  [x]  The limb is warm and well perfused  [x]  Capillary refill is intact.  []  Edema  [x]  none  []  mild  []  moderate  []  severe  []  Venous stasis changes  [x]  none  []  mild  []  moderate  []  severe    Contralateral Knee:  [x]  No pain with range of motion     Bilateral Hip Exam:  [x]  Negative logroll    Data Reviewed:     No imaging studies were obtained today.    XRays:  (3 views: AP, lateral and patella views) of her left knee taken on 02/07/2014 showed severe degenerative changes in the patellofemoral compartment, severe degenerative changes in the medial compartment and severe (bone-on-bone) degenerative changes in the lateral compartment. There is severe joint space narrowing and osteophyte formation in the lateral compartment and the patellofemoral compartment as well as medial compartment. There is valgus alignment. There are retained staples: Two in the distal lateral femur, one in  the proximal medial tibia consistent with a previous anterior cruciate ligament reconstruction with visible tunnels.    MRI:  The MRI of her left knee dated 12/09/2013 shows high-grade chondromalacia patella with tiny subchondral ulceration at the patellar apex. Large joint effusion, complex nature, likely associated with synovial hyperplasia. Advanced degenerative osteoarthritis in the medial and lateral joint space compartments. Horizontal tear posterior medial meniscal horn. Maceration of  the anterior lateral meniscus horn with horizontal tear within the meniscal remnant. Intact anterior cruciate ligament graft in isometric position. Popliteal tendon insertion strain or low-grade partial-thickness tear.         PROCEDURE NOTE: LEFT KNEE SYNVISC (2 ml) INJECTION: #2 of the series of 3 injections  03/07/2014 at 10:19 AM   Procedure: Second Synvisc Injection out of a series of three injections  Verbal consent was obtained.  Risks and benefits were explained. Questions were encouraged and answered.      Timeout Verification Completed including:    Correct patient: Molly [name] was identified    Correct procedure    Correct site & side    Correct equipment and supplies    Staff member: Lisabeth Register MD     Assistant: Charlette Caffey      The injection site (superolateral) was prepped with Chlora-Prep using aseptic technique and a left knee intra-articular injection was performed with ethyl chloride & 1% lidocaine (2 ml) for anesthetic.  SYNVISC (2 ml) #2 of a series of three injections was injected.  A sterile adhesive dressing was applied.    Post procedure:  The patient tolerated the treatment well.  Instructions to patient:  Appropriate post injections instructions were given to the patient.    Assessment:     Molly [name] is a 53 y.o. year old female with the following diagnosis:    1. Left knee DJD  PR ARTHROCENTESIS ASPIR&/INJ MAJOR JT/BURSA W/O [place]    PR SYNVISC OR SYNVISC-ONE       Her overall course: Improving with injections    Plan:      I discussed the diagnosis and the treatment options with Molly [name] today.   In Summary:  1). The various treatment options were outlined and discussed with Molly [name] including:      [x]  Conservative care options:      physical therapy, ice, NSAIDs, bracing, and activity modification       [x]  The indications for therapeutic injections Steroids and Hyluronic Acid/Synvisc/Euflexxa/Supartz    2). After considering the various options discussed, Molly [name] elected to pursue a course of treatment that includes the following:      [x]  MEDICATIONS/REFILLS prescribed today are listed below.       No refills today.  She can continue her Relafen and Voltaren gel    [x]  Physical Therapy/HEP Activities as tolerated       []  Script: 2 x per week x 4 weeks    [x]  Ice to affected area: 15-20 minutes 4 x day    [x]  Synvisc #2 out of a series of 3 injections today into her left knee     Return to Clinic/Follow - Up:  Molly [name] was asked to make a follow-up appointment:    [x]  next week for the third injection    Molly [name] was instructed to call the office if her symptoms worsen or if new symptoms appear prior to the next scheduled visit. She is specifically instructed to contact the office between now &  her scheduled appointment if she has concerns related to her condition or if she needs assistance in scheduling the above tests. She is welcome to call for an appointment sooner if she has any additional concerns or questions.          Patient Education Materials Provided:    Patient Instructions     Molly A Lacks was instructed to apply ice to the injection area for 15 - 20 minutes several times a day to decrease pain and and swelling.     Ice ("ICE IS YOUR FRIEND": try using a bag of frozen peas or corn) for 15 - 20 minutes 3 x day.    Limit activities today.  You may resume normal activities tomorrow if you have no pain.    For severe pain:  If after hours, she is to go to Emergency Room.  During office hours she must come in to the office.  Kiernan A Nuss was instructed to call the office if there are any questions or concerns related to her condition.     I have asked her to schedule a follow-up appointment for 1week from now for re-evaluation and possible repeat injection.    She is specifically instructed to contact the office between now & her scheduled appointment if she has concerns related to her condition. She is welcome to call for an appointment sooner  if she has any additional concerns or questions.    General Medication Instructions:  Any prescriptions must be used as directed.  If you have any concerns, questions or require refills, please contact our office.  If you experience any adverse reactions, stop the medication and call our office immediately.    If you designate a preferred pharmacy, appropriate prescriptions will be sent to your preferred pharmacy for pickup to be use as directed.    Patient Driving Instructions:  No driving if you are taking narcotic pain medications or muscle relaxers.    PATIENT REMINDER:   Carry a list of your medications and allergies with you at all times.  Call your pharmacy and our office at least 1 week in advance to refill prescriptions.  Narcotic medications will not be refilled after hours or on weekends.         ATTENTION    As of May 23, 2013, the [employer] [employer] ([employer]) has mandated that ALL PRESCRIPTION PAIN MEDICINE cannot be called into your pharmacy.    This includes:    Oxycodone (Percocet)  Hydrocodone (Vicodin, Norco)  Tramadol (Ultram)  Other    These medications must have prescriptions which are written and signed by your doctor (Dr. Caralee Ates).  This means that you must call ahead and come in to the office to pick up the paper prescription and take it to your pharmacy.    We are sorry for any inconvenience but this is now the law.    Lisabeth Register MD  Board Certified Orthopaedic Surgeon  Knee and Shoulder Surgery Specialist    Contact Information:  Clement Sayres [place], [phone] Medical Secretary)  [facility] main number: use after hours [phone])              IMPORTANT NARCOTIC INFORMATION: PLEASE READ     [x]  DO NOT share your prescription medication with anyone!  Sharing is illegal.  The prescription dose is based on your age, weight, and health problems.  Sharing your narcotic prescription can lead to accidental death of the individual for which the prescription was not prescribed.  You  may not know about his/her addiction problem.     [x]  Always use the same pharmacy when filling your narcotic prescriptions.     [x]  DO NOT mix your narcotic prescription with alcohol.  Mixing the narcotic prescription medication with alcohol causes depressive effects including breathing problems, organ malfunction, and cardiac arrest.     [x]  Always keep your narcotic medication in a locked, secured location.  Keep your medication private.  This is to avoid individuals from taking your medication without your knowledge.  This medication is highly sought after and locking your prescriptions will protect you from being a target of crime.     [x]  DO NOT stock pile your medication.  This also will protect you from being a target of crime.     [x]  Dispose of any unused medications properly.  Do not flush the medications.  Look for appropriate waste collection programs in your community and drug take back events.     [x]  DO NOT drive while taking narcotic pain medication or muscle relaxers.                VITAMIN D3    Dr Caralee Ates recommends that you add an over-the-counter supplement of vitamin D3, (at least 2,000 IU daily).    Vitamin D3 is widely available without a prescription at pharmacies and buying clubs ([employer], [employer]) and on-line at sites such as [employer]ronic[employer]. It comes in a variety of formulations (tablets, gelcaps, liquid) and doses (1,000 IU, 2,000 IU, 4,000 IU, 5,000 IU and even higher). The right dose for most people is 2,000 IU per day but higher doses are sometimes needed.  We have not seen any problems with any of the formulations so we have no reason to recommend a specific brand. You can take your vitamin D3 at any time of the day, with or without food, with or without calcium.  Because vitamin D3 is long acting, if you miss your vitamin D3 on one day you can double up the dose on a later day.                  Orders Placed This Encounter   Procedures   ??? PR ARTHROCENTESIS ASPIR&/INJ MAJOR JT/BURSA W/O [place]    ??? PR SYNVISC OR SYNVISC-ONE       Refills/New Prescriptions:  No orders of the defined types were placed in this encounter.     Today's prescription medications will be e-scribed (when appropriate) to the Patient's Preferred Pharmacy:   [facility] [address] [place] - [address] - P [phone] Carmon Ginsberg [phone]  [address]  [place] [address]  Phone: [phone] Fax: [phone]    [name] - [place], [place] - [address] [phone] Carmon Ginsberg [phone]  [address]  [place] [address]  Phone: [phone] Fax: [phone]         Lisabeth Register MD  Board Certified Orthopaedic Surgeon  Knee and Shoulder Surgery Specialist  Electronically signed by Lisabeth Register MD on 03/07/2014 at 10:19 AM     Contact Information:  Clement Sayres [place], [phone] Medical Secretary)  [name] main number: use after hours [phone])    This dictation was performed with a verbal recognition program (DRAGON) and it was checked for errors.  It is possible that there are still dictated errors within this office note.  If so, please bring any errors to my attention for an addendum.  All efforts were made to ensure that  this office note is accurate.

## 2014-03-07 NOTE — Patient Instructions (Signed)
Molly [name] was instructed to apply ice to the injection area for 15 - 20 minutes several times a day to decrease pain and and swelling.     Ice ("ICE IS YOUR FRIEND": try using a bag of frozen peas or corn) for 15 - 20 minutes 3 x day.    Limit activities today.  You may resume normal activities tomorrow if you have no pain.    For severe pain:  If after hours, she is to go to Emergency Room.  During office hours she must come in to the office.  Molly [name] was instructed to call the office if there are any questions or concerns related to her condition.     I have asked her to schedule a follow-up appointment for 1week from now for re-evaluation and possible repeat injection.    She is specifically instructed to contact the office between now & her scheduled appointment if she has concerns related to her condition. She is welcome to call for an appointment sooner if she has any additional concerns or questions.    General Medication Instructions:  Any prescriptions must be used as directed.  If you have any concerns, questions or require refills, please contact our office.  If you experience any adverse reactions, stop the medication and call our office immediately.    If you designate a preferred pharmacy, appropriate prescriptions will be sent to your preferred pharmacy for pickup to be use as directed.    Patient Driving Instructions:  No driving if you are taking narcotic pain medications or muscle relaxers.    PATIENT REMINDER:   Carry a list of your medications and allergies with you at all times.  Call your pharmacy and our office at least 1 week in advance to refill prescriptions.  Narcotic medications will not be refilled after hours or on weekends.         ATTENTION    As of May 23, 2013, the [employer] [employer] ([employer]) has mandated that ALL PRESCRIPTION PAIN MEDICINE cannot be called into your pharmacy.    This includes:    Oxycodone (Percocet)  Hydrocodone (Vicodin,  Norco)  Tramadol (Ultram)  Other    These medications must have prescriptions which are written and signed by your doctor (Dr. Caralee Ates).  This means that you must call ahead and come in to the office to pick up the paper prescription and take it to your pharmacy.    We are sorry for any inconvenience but this is now the law.    Lisabeth Register MD  Board Certified Orthopaedic Surgeon  Knee and Shoulder Surgery Specialist    Contact Information:  Clement Sayres [place], [phone] Medical Secretary)  [facility] main number: use after hours [phone])              IMPORTANT NARCOTIC INFORMATION: PLEASE READ     [x]  DO NOT share your prescription medication with anyone!  Sharing is illegal.  The prescription dose is based on your age, weight, and health problems.  Sharing your narcotic prescription can lead to accidental death of the individual for which the prescription was not prescribed.  You may not know about his/her addiction problem.     [x]  Always use the same pharmacy when filling your narcotic prescriptions.     [x]  DO NOT mix your narcotic prescription with alcohol.  Mixing the narcotic prescription medication with alcohol causes depressive effects including breathing problems, organ malfunction, and cardiac arrest.     [  x] Always keep your narcotic medication in a locked, secured location.  Keep your medication private.  This is to avoid individuals from taking your medication without your knowledge.  This medication is highly sought after and locking your prescriptions will protect you from being a target of crime.     [x]  DO NOT stock pile your medication.  This also will protect you from being a target of crime.     [x]  Dispose of any unused medications properly.  Do not flush the medications.  Look for appropriate waste collection programs in your community and drug take back events.     [x]  DO NOT drive while taking narcotic pain medication or muscle relaxers.                VITAMIN D3    Dr Rosana Berger recommends  that you add an over-the-counter supplement of vitamin D3, (at least 2,000 IU daily).    Vitamin D3 is widely available without a prescription at pharmacies and buying clubs ([employer], [employer]) and on-line at sites such as http://www.washington-warren.com/. It comes in a variety of formulations (tablets, gelcaps, liquid) and doses (1,000 IU, 2,000 IU, 4,000 IU, 5,000 IU and even higher). The right dose for most people is 2,000 IU per day but higher doses are sometimes needed.  We have not seen any problems with any of the formulations so we have no reason to recommend a specific brand. You can take your vitamin D3 at any time of the day, with or without food, with or without calcium.  Because vitamin D3 is long acting, if you miss your vitamin D3 on one day you can double up the dose on a later day.

## 2014-03-10 NOTE — Other (Signed)
Physical Therapy Daily Treatment Note  Date:  03/10/2014    Patient Name:  Molly [name]    DOB:  Jun 02, 1961  MRN: [id number]  Restrictions/Precautions:   Medical/Treatment Diagnosis Information:   ?? Diagnosis: L knee OA, DJD, chondromalacia  ?? Treatment Diagnosis: L knee pain, decreased ROM and strength  Insurance/Certification information:  PT Insurance Information: PT BENEFITS 2015/ NGS [employer]/ DONNA/ EFFECTIVE 10-28-12/ ACTIVE/ 1200 DED MET/ PAYS 90%/ 3000 OOP/30 VPCY/ REF# DONNA 02-07-14 10:55/ PAG    Physician Information:  Referring Practitioner: Zadie Rhine, MD  Plan of care signed (Y/N):   Visit# / total visits:  7/30  Pain level: 3/10     G-Code (if applicable):      Date / Visit # G-Code Applied:  2014-03-03 visit #1  PT G-Codes  Functional Assessment Tool Used: LEFS  Score: 30/80=37.5% (62.5% deficit)  Functional Limitation: Mobility: Walking and moving around  Mobility: Walking and Moving Around Current Status (Q6761): At least 60 percent but less than 80 percent impaired, limited or restricted  Mobility: Walking and Moving Around Goal Status [phone]): At least 1 percent but less than 20 percent impaired, limited or restricted    Progress Note: []   Yes  [x]   No  Next due by: week of 03/12/14      Subjective: Knee continues to feel better each day. Saw Dr. Rosana Berger on Friday for 2nd Synvisc injection. Knee feels better since injection.    Objective:  Observation:  ?? Significant knee genu valgum in standing  ?? Quad Recruitment: fair - on L; fair + on R  ?? Effusion: medial and lateral aspects of infra patella, "pocket" of fluid, seen bilaterally  ?? Palpation: + joint lines  ?? Gait: Decreased step length, increased lateral sway.  ?? Patella mobility: good  Flexibility  L  R  Comment    Hamstring  SLR 70 degrees  SLR 80 degrees     Gastroc  Mild tightness  Mild tightness     Quad  Mod tightness  - tightness       ROM  PROM  AROM  Overpressure  Comment     L  R  L  R  L  R     Flexion  122  134  117  129        Extension  -4  -3  -2  0         Strength  L  R  Comment    Quad  4-/5  4/5     Hamstring  4-/5  4/5     Gastroc  4+/5  4+/5     Hip flex  4/5  4/5     Glute max  4/5  4/5     Glute med  4/5  4/5     Glute min  4-/5  4-/5       Special Test  Results/Comment    Meniscal Click  -    Crepitus  +    Valgus Laxity  -    Varus Laxity  -        Exercises:  Exercise/Equipment Resistance/Repetitions Other comments   Stretching     Hamstring 5x30" prop   Hip Flexion     ITB     Grion Quad     Inclined Calf 3x30"    Towel Pull          SLR     Supine 3x10    Prone  Abduction 3x10    Adducton     SLR+     Clam shells 3x10    Bridging 3x10         Isometrics     Quad sets    Adduction         Patellar Glides     Medial     Superior     Inferior          ROM     Passive     Active     Weight Shift     Hang Weights     Sheet Pulls     Ankle Pumps          CKC     Calf raises 3x15    Wall sits     Step ups/over 3x10 L1    1 leg stand     Squatting     CC TKE 3x15 black   Balance 3x30" tandem bilateral        PRE     Extension 3x10 10#   RANGE:   Flexion 3x10 30# RANGE:        Cable Column          Leg Press  RANGE:        Bike     Treadmill            Other Therapeutic Activities: N/A    Home Exercise Program:   Initiated 02/12/14. Printed hand out given. Pt demonstrated proper form of each exercise and expressed verbal understanding of frequency and duration.     Patient Education:      02/12/14 Pt educated on diagnosis, prognosis and PT plan of care. Educated on [place]. Pt questions were addressed and answered.  03/03/14 Pt educated on used of home EMPI unit.  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  A muscle stim device with conductive  garment is being prescribed to facilitate strengthening of their quad contraction.  This is in accordance with the therapist's established rehabilitation plan to treat quad atrophy.    Colonel Bald, PT    Timed Code Treatment Minutes: 38 (TEx23 TAx15) +EMS    Total Treatment Minutes: 75    Assessment:  [x]  Patient tolerated treatment well []  Patient limited by fatigue  []  Patient limited by pain  []  Patient limited by other medical complications  [x]  Other: Pain along lateral aspect of the L knee in tandem stance when L foot is in the rear, no pain when L foot is in the front; perform exercise with L in front only from now on. Quad weakness persists as noted by difficulty maintaining knee extension during SLRs. PROM/AROM knee flexion remains unchanged, extension deficits persist but pt has made good progress and ext is [name]. Pt requires continued PT to address ROM and strength deficits.    Plan for Next Session: Progress as tolerated    Prognosis: []  Good [x]  Fair  []  Poor    Patient Requires Follow-up: [x]  Yes  []  No      Goals:  Short term goals  Time Frame for Short term goals: 2  weeks 02/26/14  Short term goal 1: Independent HEP  Short term goal 2: Decrease pain by 1-2 grades  Short term goal 3: Improve PROM knee flexion by 5-10 degrees  Short term goal 4: Improve AROM knee flexion by 5 degrees or greater    Long term goals  Time Frame for Long term goals : 4-6 weeks 03/12/14-03/26/14  Long term goal 1: PROM 0-130 degrees or greater  Long term goal 2: AROM 0-125 degrees or greater  Long term goal 3: Pain free at rest; pain 0-2/10 with ADLs  Long term goal 4: BLUE grossly 4/5 or greater  Long term goal 5: Improve LEFS score to reflect 1-20% deficit      Plan:   [x]  Continue per plan of care []  Alter current plan (see comments)  []  Plan of care initiated []  Hold pending MD visit []  Discharge    MD Follow-up:     Electronically signed by:  Shela Leff, DPT

## 2014-03-14 NOTE — Progress Notes (Signed)
SYNVISC ONE INJECTION    NDC# [address]-67  Lot # [id number]  Exp: 06/2016  SITE: Oswaldo Milian Knee     LIDOCAINE    NDC# 7510-2585-27  Lot # [id number]  Exp. 04/23/2015    We provided the Synvisc medication.  This is injection #3 of 3.

## 2014-03-14 NOTE — Progress Notes (Signed)
Lisabeth RegisterMichelle Jaslynne Dahan, MD  Orthopaedic Surgery & Sports Medicine  Knee & Shoulder Specialist    []   [facility]    [x]   [facility]  [address]                    [address]  [place], [place]  [id number]                   [place]  [id number]  [phone]                     [phone]  [phone] (fax)    [phone] (fax)      PATIENT: Molly [name]    53 y.o.  female  DATE OF BIRTH: [birth year]   MRN:  [id number]       Date of current encounter: 03/14/2014  This encounter is evaluated as a:       []  New Patient Visit    [x]  Established Patient Visit   []  Post-Op Visit     []  Consult: requested by         []  Worker's Comp       Patient's PCP is Dr. [name], MD    Subjective:     Reason for Visit: left knee injection: Synvisc #3 out of a series of 3 injections    Chief Complaint   Patient presents with   ??? Knee Pain     Left Knee synvisc #3 of 3 injection       HPI:  Molly [name] is a 53 y.o. year old, Caucasian female complaining of left knee pain.     PAIN ASSESSMENT:   Molly [name] has a pain level on 0/10 scale:  2 out of 10 with activity and 0 out of 10 at rest. Her pain is intermittent and occurs intermittently.  When the pain occurs, it lasts seconds.  She reports that rest makes her pain better and going up and down stairs makes her pain worse. She denies pain at night.  She has numbness in left knee.  She describes her pain as dull.  Her pain is located at the lateral aspect of her left knee.  Her pain does radiate down lateral lower leg.  She feels that the problem is gradually improving.  The pain is not limiting her behavior. Her last injection was #2 of 3 synvisc on 03/07/14 and she has been improving since then.    Patient Active Problem List   Diagnosis   ??? H/O vitamin D deficiency   ??? History of non-Hodgkin's lymphoma   ??? Impaired glucose tolerance   ??? HLD (hyperlipidemia)   ??? Vitamin D deficiency   ??? S/P left ACL reconstruction 1991 by Dr. Henrene HawkingSiegel   ???  Chondromalacia patellae of left knee   ??? Tear of lateral meniscus of left knee   ??? Tear of medial meniscus of left knee   ??? Left knee DJD   ??? Morbid obesity with BMI of 40.0-44.9, adult (HCC)   ??? Acquired valgus deformity knee   ??? Knee effusion       Allergies:  Sulfa antibiotics    Medications:  No outpatient prescriptions have been marked as taking for the 03/14/14 encounter (Office Visit) with Lisabeth RegisterMichelle Hayven Fatima, MD.       Waynetta SandyBeth A [name] past medical history, family history, social history and review of systems have been reviewed and are recorded in the chart.    Objective:  Physical Exam  Vital Signs:  BP 111/82    Pulse 114    Ht 5\' 10"  (1.778 m)    Wt 290 lb (131.543 kg)    BMI 41.61 kg/m2       Constitution:  Generally, Molly [name] is [x]  alert, [x]  appears stated age, and [x]  in no distress.  Her general body habitus is []  Cachectic  []  Thin  []  Normal  []  Obese  [x]  Morbidly Obese    Head: [x]  Normocephalic  Eyes: [x]  Extra-occular muscles intact  Left Ear: [x]  External Ear normal   Right Ear: [x]  External Ear normal   Nose: [x]  Normal  Mouth: [x]  Oral mucosa moist  [x]  No perioral lesions    Pulmonary: [x]  Respirations unlabored and regular  Skin: [x]  Warm [x]  Well perfused     Psychiatric:   [x]  Good judgement and insight  [x]  Oriented to [x]  person, [x]  place, and [x]  time.  [x]  Mood appropriate for circumstances.    Gait:   Gait is []  Normal  [x]  Impaired slight limp  Assistive Device: [x]  None  []  Knee Brace  []  Cane  []  Crutches   []  Walker   []  Wheelchair  []  Other     ORTHOPAEDIC KNEE EXAM: LEFT    Inspection:  [x]  Skin intact without abrasion or lacerations.  []  Ecchymosis:  [x]  none  []  mild  []  moderate  []  severe  []  Atrophy:  [x]  none  []  mild  []  moderate  []  severe  []  Effusion: [x]  none  []  mild  []  moderate  []  severe  [x]  Scar / Surgical incision(s): [x]  A-Scope Portals  [x]  Open Surgical Incision(s)     Alignment:  [x]  Normal  []  Varus []  Valgus    Range of Motion:  []  Normal Knee ROM          []  Deferred: acute injury/post-surgery/pain   [x]  Limited ROM:     Palpation:   []  No Tenderness  [x]  Tenderness:  []  mild  []  moderate  []  severe   [x]  Patellofemoral Crepitation:  []  none  [x]  mild  []  moderate  []  severe  []  Baker's Cyst:  [x]  none  []  small  []  medium  []  large  []  Homan's Sign: []  negative  []  positive     Motor Function:   [x]  No gross motor weakness  []  Motor weakness:  []  mild  []  moderate  []  severe     Neurologic:  [x]  Sensation to light touch intact   [x]  Coordination / proprioception intact    Circulation:  [x]  The limb is warm and well perfused  [x]  Capillary refill is intact.  []  Edema  [x]  none  []  mild  []  moderate  []  severe  []  Venous stasis changes  [x]  none  []  mild  []  moderate  []  severe    Data Reviewed:     No imaging studies were obtained [name].    XRays:  (3 views: AP, lateral and patella views) of her left knee taken on 02/07/2014 showed severe degenerative changes in the patellofemoral compartment, severe degenerative changes in the medial compartment and severe (bone-on-bone) degenerative changes in the lateral compartment. There is severe joint space narrowing and osteophyte formation in the lateral compartment and the patellofemoral compartment as well as medial compartment. There is valgus alignment. There are retained staples: Two in the distal lateral femur, one in the proximal medial tibia consistent with a previous anterior cruciate ligament reconstruction with visible tunnels.  MRI:  The MRI of her left knee dated 12/09/2013 shows high-grade chondromalacia patella with tiny subchondral ulceration at the patellar apex. Large joint effusion, complex nature, likely associated with synovial hyperplasia. Advanced degenerative osteoarthritis in the medial and lateral joint space compartments. Horizontal tear posterior medial meniscal horn. Maceration of the anterior lateral meniscus horn with horizontal tear within the meniscal remnant. Intact anterior cruciate  ligament graft in isometric position. Popliteal tendon insertion strain or low-grade partial-thickness tear.         PROCEDURE NOTE: LEFT KNEE SYNVISC (2 ml) INJECTION: #3 of the series of 3 injections  03/14/2014 at 3:36 PM   Procedure: Third Synvisc Injection out of a series of three injections  Verbal consent was obtained.  Risks and benefits were explained. Questions were encouraged and answered.      Timeout Verification Completed including:    Correct patient: Molly [name] was identified    Correct procedure    Correct site & side    Correct equipment and supplies    Staff member: Lisabeth Register MD     Assistant: Charlette Caffey      The injection site (superolateral) was prepped with Chlora-Prep using aseptic technique and a left knee intra-articular injection was performed with ethyl chloride & 1% lidocaine (2 ml) for anesthetic.  SYNVISC (2 ml) #3 of a series of three injections was injected.  A sterile adhesive dressing was applied.    Post procedure:  The patient tolerated the treatment well.  Instructions to patient:  Appropriate post injections instructions were given to the patient.    Assessment:     Molly [name] is a 53 y.o. year old female with the [name] diagnosis:    1. Left knee DJD  PR ARTHROCENTESIS ASPIR&/INJ MAJOR JT/BURSA W/O [place]    PR SYNVISC OR SYNVISC-ONE   2. Chondromalacia patellae of left knee     3. S/P left ACL reconstruction 1991 by Dr. Henrene Hawking     4. Morbid obesity with BMI of 40.0-44.9, adult (HCC)         Her overall course: Improved with injections    Plan:      I discussed the diagnosis and the treatment options with Molly [name].   In Summary:  1). The various treatment options were outlined and discussed with Molly [name] including:      [x]  Conservative care options:      physical therapy, ice, NSAIDs, bracing, and activity modification       [x]  The indications for therapeutic injections Steroids and Hyluronic Acid/Synvisc/Euflexxa/Supartz      [x]  The  indications for (possible future) operative intervention total knee replacement in her lifetime    2). After considering the various options discussed, Molly [name]:      [x]  MEDICATIONS/REFILLS prescribed [name] are listed below.       No refills [name]    [x]  Physical Therapy/HEP Activities as tolerated       []  Script: 2 x per week x 4 weeks    [x]  Ice to affected area: 15-20 minutes 4 x day    [x]  Synvisc #3 out of a series of 3 injections [name] into her left knee     Return to Clinic/Follow - Up:  Molly [name] was asked to make a follow-up appointment:    [x]  6 weeks    Kera A Mruk was instructed to call the office if  her symptoms worsen or if new symptoms appear prior to the next scheduled visit. She is specifically instructed to contact the office between now & her scheduled appointment if she has concerns related to her condition or if she needs assistance in scheduling the above tests. She is welcome to call for an appointment sooner if she has any additional concerns or questions.          Patient Education Materials Provided:    Patient Instructions     Calea A Cassetta was instructed to apply ice to the injection area for 15 - 20 minutes several times a day to decrease pain and and swelling.     Ice ("ICE IS YOUR FRIEND": try using a bag of frozen peas or corn) for 15 - 20 minutes 3 x day.    Limit activities [name].  You may resume normal activities tomorrow if you have no pain.    For severe pain:  If after hours, she is to go to Emergency Room.  During office hours she must come in to the office.  Chyler A Ives was instructed to call the office if there are any questions or concerns related to her condition.     I have asked her to schedule a follow-up appointment for 6-8 weeks from now for re-evaluation and possible repeat injection.    She is specifically instructed to contact the office between now & her scheduled appointment if she  has concerns related to her condition. She is welcome to call for an appointment sooner if she has any additional concerns or questions.    General Medication Instructions:  Any prescriptions must be used as directed.  If you have any concerns, questions or require refills, please contact our office.  If you experience any adverse reactions, stop the medication and call our office immediately.    If you designate a preferred pharmacy, appropriate prescriptions will be sent to your preferred pharmacy for pickup to be use as directed.    Patient Driving Instructions:  No driving if you are taking narcotic pain medications or muscle relaxers.    PATIENT REMINDER:   Carry a list of your medications and allergies with you at all times.  Call your pharmacy and our office at least 1 week in advance to refill prescriptions.  Narcotic medications will not be refilled after hours or on weekends.         ATTENTION    As of May 23, 2013, the [employer] ([employer]) has mandated that ALL PRESCRIPTION PAIN MEDICINE cannot be called into your pharmacy.    This includes:    Oxycodone (Percocet)  Hydrocodone (Vicodin, Norco)  Tramadol (Ultram)  Other    These medications must have prescriptions which are written and signed by your doctor (Dr. Caralee Ates).  This means that you must call ahead and come in to the office to pick up the paper prescription and take it to your pharmacy.    We are sorry for any inconvenience but this is now the law.    Lisabeth Register MD  Board Certified Orthopaedic Surgeon  Knee and Shoulder Surgery Specialist    Contact Information:  Clement Sayres [place], [phone] Medical Secretary)  [facility] main number: use after hours [phone])              IMPORTANT NARCOTIC INFORMATION: PLEASE READ     [x]  DO NOT share your prescription medication with anyone!  Sharing is illegal.  The prescription dose is based on  your age, weight, and health problems.  Sharing your narcotic prescription can lead to  accidental death of the individual for which the prescription was not prescribed.  You may not know about his/her addiction problem.     [x]  Always use the same pharmacy when filling your narcotic prescriptions.     [x]  DO NOT mix your narcotic prescription with alcohol.  Mixing the narcotic prescription medication with alcohol causes depressive effects including breathing problems, organ malfunction, and cardiac arrest.     [x]  Always keep your narcotic medication in a locked, secured location.  Keep your medication private.  This is to avoid individuals from taking your medication without your knowledge.  This medication is highly sought after and locking your prescriptions will protect you from being a target of crime.     [x]  DO NOT stock pile your medication.  This also will protect you from being a target of crime.     [x]  Dispose of any unused medications properly.  Do not flush the medications.  Look for appropriate waste collection programs in your community and drug take back events.     [x]  DO NOT drive while taking narcotic pain medication or muscle relaxers.                  VITAMIN D3    Dr Caralee Ates recommends that you add an over-the-counter supplement of vitamin D3, (at least 2,000 IU daily).    Vitamin D3 is widely available without a prescription at pharmacies and buying clubs ([employer], [employer]) and on-line at sites such as [employer]. It comes in a variety of formulations (tablets, gelcaps, liquid) and doses (1,000 IU, 2,000 IU, 4,000 IU, 5,000 IU and even higher). The right dose for most people is 2,000 IU per day but higher doses are sometimes needed.  We have not seen any problems with any of the formulations so we have no reason to recommend a specific brand. You can take your vitamin D3 at any time of the day, with or without food, with or without calcium.  Because vitamin D3 is long acting, if you miss your vitamin D3 on one day you can double up the dose on a later day.                  Orders  Placed This Encounter   Procedures   ??? PR ARTHROCENTESIS ASPIR&/INJ MAJOR JT/BURSA W/O [place]   ??? PR SYNVISC OR SYNVISC-ONE       Refills/New Prescriptions:  No orders of the defined types were placed in this encounter.     [name]'s prescription medications will be e-scribed (when appropriate) to the Patient's Preferred Pharmacy:   [facility] [address] [place] - [address] - P [phone] Carmon Ginsberg [phone]  [address]  [place] [address]  Phone: [phone] Fax: [phone]    [name] DELIV[facility] - [place], [place] - [address]mann [phone] Carmon Ginsberg [phone]  [address]  [place] [address]  Phone: [phone] Fax: [phone]         Lisabeth Register MD  Board Certified Orthopaedic Surgeon  Knee and Shoulder Surgery Specialist  Electronically signed by Lisabeth Register MD on 03/14/2014 at 3:36 PM     Contact Information:  Clement Sayres [place], [phone] Medical Secretary)  [name] main number: use after hours [phone])    This dictation was performed with a verbal recognition program (DRAGON) and it was checked for errors.  It is possible that there  are still dictated errors within this office note.  If so, please bring any errors to my attention for an addendum.  All efforts were made to ensure that this office note is accurate.

## 2014-03-14 NOTE — Patient Instructions (Signed)
Molly [name] was instructed to apply ice to the injection area for 15 - 20 minutes several times a day to decrease pain and and swelling.     Ice ("ICE IS YOUR FRIEND": try using a bag of frozen peas or corn) for 15 - 20 minutes 3 x day.    Limit activities today.  You may resume normal activities tomorrow if you have no pain.    For severe pain:  If after hours, she is to go to Emergency Room.  During office hours she must come in to the office.  Molly [name] was instructed to call the office if there are any questions or concerns related to her condition.     I have asked her to schedule a follow-up appointment for 6-8 weeks from now for re-evaluation and possible repeat injection.    She is specifically instructed to contact the office between now & her scheduled appointment if she has concerns related to her condition. She is welcome to call for an appointment sooner if she has any additional concerns or questions.    General Medication Instructions:  Any prescriptions must be used as directed.  If you have any concerns, questions or require refills, please contact our office.  If you experience any adverse reactions, stop the medication and call our office immediately.    If you designate a preferred pharmacy, appropriate prescriptions will be sent to your preferred pharmacy for pickup to be use as directed.    Patient Driving Instructions:  No driving if you are taking narcotic pain medications or muscle relaxers.    PATIENT REMINDER:   Carry a list of your medications and allergies with you at all times.  Call your pharmacy and our office at least 1 week in advance to refill prescriptions.  Narcotic medications will not be refilled after hours or on weekends.         ATTENTION    As of May 23, 2013, the [employer] ([employer]) has mandated that ALL PRESCRIPTION PAIN MEDICINE cannot be called into your pharmacy.    This includes:    Oxycodone (Percocet)  Hydrocodone (Vicodin,  Norco)  Tramadol (Ultram)  Other    These medications must have prescriptions which are written and signed by your doctor (Dr. Caralee Ates).  This means that you must call ahead and come in to the office to pick up the paper prescription and take it to your pharmacy.    We are sorry for any inconvenience but this is now the law.    Lisabeth Register MD  Board Certified Orthopaedic Surgeon  Knee and Shoulder Surgery Specialist    Contact Information:  [address] [place], [phone] Medical Secretary)  [facility] main number: use after hours [phone])              IMPORTANT NARCOTIC INFORMATION: PLEASE READ     [x]  DO NOT share your prescription medication with anyone!  Sharing is illegal.  The prescription dose is based on your age, weight, and health problems.  Sharing your narcotic prescription can lead to accidental death of the individual for which the prescription was not prescribed.  You may not know about his/her addiction problem.     [x]  Always use the same pharmacy when filling your narcotic prescriptions.     [x]  DO NOT mix your narcotic prescription with alcohol.  Mixing the narcotic prescription medication with alcohol causes depressive effects including breathing problems, organ malfunction, and cardiac arrest.     [  x] Always keep your narcotic medication in a locked, secured location.  Keep your medication private.  This is to avoid individuals from taking your medication without your knowledge.  This medication is highly sought after and locking your prescriptions will protect you from being a target of crime.     [x]  DO NOT stock pile your medication.  This also will protect you from being a target of crime.     [x]  Dispose of any unused medications properly.  Do not flush the medications.  Look for appropriate waste collection programs in your community and drug take back events.     [x]  DO NOT drive while taking narcotic pain medication or muscle relaxers.                  VITAMIN D3    Dr Rosana Berger recommends  that you add an over-the-counter supplement of vitamin D3, (at least 2,000 IU daily).    Vitamin D3 is widely available without a prescription at pharmacies and buying clubs ([employer], [employer]) and on-line at sites such as http://[email or web address]-[employer].com/. It comes in a variety of formulations (tablets, gelcaps, liquid) and doses (1,000 IU, 2,000 IU, 4,000 IU, 5,000 IU and even higher). The right dose for most people is 2,000 IU per day but higher doses are sometimes needed.  We have not seen any problems with any of the formulations so we have no reason to recommend a specific brand. You can take your vitamin D3 at any time of the day, with or without food, with or without calcium.  Because vitamin D3 is long acting, if you miss your vitamin D3 on one day you can double up the dose on a later day.

## 2014-03-17 NOTE — Other (Signed)
Physical Therapy Daily Treatment Note  Date:  03/17/2014    Patient Name:  Molly [name]    DOB:  1961/02/13  MRN: [id number]  Restrictions/Precautions:   Medical/Treatment Diagnosis Information:   ?? Diagnosis: L knee OA, DJD, chondromalacia  ?? Treatment Diagnosis: L knee pain, decreased ROM and strength  Insurance/Certification information:  PT Insurance Information: PT BENEFITS 2015/ [employer]/ DONNA/ EFFECTIVE 10-28-12/ ACTIVE/ 1200 DED MET/ PAYS 90%/ 3000 OOP/30 VPCY/ REF# DONNA 02-07-14 10:55/ PAG    Physician Information:  Referring Practitioner: Zadie Rhine, MD  Plan of care signed (Y/N):   Visit# / total visits:  8/30  Pain level: 3/10     G-Code (if applicable):      Date / Visit # G-Code Applied:  March 04, 2014 visit #1  PT G-Codes  Functional Assessment Tool Used: LEFS  Score: 30/80=37.5% (62.5% deficit)    03/17/14 Score 45/80=56.25% (43.75% deficit)  Functional Limitation: Mobility: Walking and moving around  Mobility: Walking and Moving Around Current Status (G3875): At least 60 percent but less than 80 percent impaired, limited or restricted  Mobility: Walking and Moving Around Goal Status [phone]): At least 1 percent but less than 20 percent impaired, limited or restricted    Progress Note: []   Yes  [x]   No  Next due by: week of 04/14/14      Subjective: Knee is feeling better, little to no pain. Still feels a little stiff when getting up or if she stands too long, typically takes 5-10 minutes for pain to set in. Really feels that the Synvisc is working.    Objective:  Observation:  ?? Significant knee genu valgum in standing  ?? Quad Recruitment: fair + bilaterally  ?? Effusion: medial and lateral aspects of infra patella, "pocket" of fluid, seen bilaterally  ?? Palpation: + L lateral/infra patella, gerdy's tubercle  ?? Gait: Decreased step length, increased lateral sway.  ?? Patella mobility: good  Flexibility  L  R  Comment    Hamstring  SLR 80 degrees  SLR 80 degrees     Gastroc  Mild tightness  Mild  tightness     Quad  Mod tightness  - tightness       ROM  PROM  AROM  Overpressure  Comment     L  R  L  R  L  R     Flexion  122  134  120  130       Extension  -4  -3  -2  0         Strength  L  R  Comment    Quad  4-/5  4/5     Hamstring  4-/5  4/5     Gastroc  4+/5  4+/5     Hip flex  4/5  4/5     Glute max  4/5  4/5     Glute med  4/5  4/5     Glute min  4-/5  4-/5       Special Test  Results/Comment    Meniscal Click  -    Crepitus  +    Valgus Laxity  -    Varus Laxity  -        Exercises:  Exercise/Equipment Resistance/Repetitions Other comments   Stretching     Hamstring 5x30" prop   Hip Flexion     ITB     Grion Quad     Inclined Calf 3x30"    Towel Pull  SLR     Supine 3x10    Prone     Abduction 3x10    Adducton     SLR+     Clam shells 3x10 Blue band   Bridging 3x10         Isometrics     Quad sets    Adduction         Patellar Glides     Medial     Superior     Inferior          ROM     Passive     Active     Weight Shift     Hang Weights     Sheet Pulls     Ankle Pumps          CKC     Calf raises 3x15    Wall sits     Step ups/over 3x10 L1    1 leg stand     Squatting     CC TKE 3x15 black   Balance 3x30" tandem bilateral        PRE     Extension 3x10 15#   RANGE:   Flexion 3x10 35# RANGE:        Cable Column          Leg Press  RANGE:        Bike     Treadmill            Other Therapeutic Activities: N/A    Home Exercise Program:   Initiated 02/12/14. Printed hand out given. Pt demonstrated proper form of each exercise and expressed verbal understanding of frequency and duration.     Patient Education:      02/12/14 Pt educated on diagnosis, prognosis and PT plan of care. Educated on [place]. Pt questions were addressed and answered.  03/03/14 Pt educated on used of home EMPI unit.  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  A muscle stim device with conductive garment is being prescribed to facilitate strengthening of their quad contraction.  This is in accordance with the therapist's established rehabilitation plan to treat quad atrophy.    Colonel Bald, PT    Timed Code Treatment Minutes: 38 (TEx23 TAx15) +EMS    Total Treatment Minutes: 70    Assessment:  [x]  Patient tolerated treatment well []  Patient limited by fatigue  []  Patient limited by pain  []  Patient limited by other medical complications  [x]  Other: PROM/AROM knee flexion remains unchanged, extension deficits persist but pt has made good progress and ext is [name]. Pt has made slight strength improvements as noted by ability to increase weights on PRE machines and her ability to perform exercises with less fatigue; however MMT scores remain relatively unchanged. Quad strength improved slightly as pt can now perform SLR without an extension lag. LEFS improved by 15 points. Pt requires continued PT to address ROM and strength deficits.    Plan for Next Session: Progress as tolerated    Prognosis: []  Good [x]  Fair  []  Poor    Patient Requires Follow-up: [x]  Yes  []  No  Goals:  Short term goals  Time Frame for Short term goals: 2 weeks 02/26/14  Short term goal 1: Independent HEP MET  Short term goal 2: Decrease pain by 1-2 grades MET  Short term goal 3: Improve PROM knee flexion by 5-10 degrees Not met  Short term goal 4: Improve AROM knee flexion by 5 degrees or greater Not met    Long term goals  Time Frame for Long term goals : 4-6 weeks 03/12/14-03/26/14  Long term goal 1: PROM 0-130 degrees or greater Not met  Long term goal 2: AROM 0-125 degrees or greater Not met  Long term goal 3: Pain free at rest; pain 0-2/10 with ADLs Progressing  Long term goal 4: BLUE grossly 4/5 or greater Progressing  Long term goal 5: Improve LEFS score to reflect 1-20%  deficit Progressing      Plan:   [x]  Continue per plan of care []  Alter current plan (see comments)  []  Plan of care initiated []  Hold pending MD visit []  Discharge    MD Follow-up:     Electronically signed by:  Shela Leff, DPT

## 2014-03-17 NOTE — Progress Notes (Signed)
Physical Therapy        Phone: [phone]  _0  [facility]: Fax: [phone]   _1  [name]: Fax: [phone]    _2  [place]:           Fax: [phone]  _3  [place]:   Fax: [phone]   _4  [place]:     Fax: [phone]        Physical Therapy Progress/Discharge Note  Date: 03/17/2014        Patient Name:  Molly [name]    DOB:  [birth year]  MRN: [id number]  Restrictions/Precautions:   Medical/Treatment Diagnosis Information:   ?? Diagnosis: L knee OA, DJD, chondromalacia  ?? Treatment Diagnosis: L knee pain, decreased ROM and strength  Insurance/Certification information: PT Insurance Information: PT BENEFITS 2015/ [employer]/ DONNA/ EFFECTIVE 10-28-12/ ACTIVE/ 1200 DED MET/ PAYS 90%/ 3000 OOP/30 VPCY/ REF# DONNA 02-07-14 10:55/ PAG   Physician Information: Referring Practitioner: Zadie Rhine, MD  Plan of care signed (Y/N):   Visit# / total visits: 8/30  Pain level: 3/10     G-Code (if applicable): Date / Visit # G-Code Applied: March 07, 2014 visit #1  PT G-Codes  Functional Assessment Tool Used: LEFS  03-07-14 Score: 30/80=37.5% (62.5% deficit)   03/17/14 Score 45/80=56.25% (43.75% deficit)  Functional Limitation: Mobility: Walking and moving around  Mobility: Walking and Moving Around Current Status (M1848): At least 60 percent but less than 80 percent impaired, limited or restricted  Mobility: Walking and Moving Around Goal Status [phone]): At least 1 percent but less than 20 percent impaired, limited or restricted    Time Period for Report: 03-07-14-03/17/14  Cancels/No-shows to date: 1    Plan of Care/Treatment to date:  _5  Therapeutic Exercise    _6  Modalities:  _7  Therapeutic Activity     _8  Ultrasound  _9  Electrical Stimulation  _10  Gait Training      _11  Cervical Traction    _12  Lumbar Traction  _13  Neuromuscular Re-education  _14  Cold/hotpack _15  Iontophoresis  _16  Instruction in HEP      Other:  _17  Manual Therapy       _18     _19  Aquatic Therapy       _20                     ?      Significant Findings  At Last Visit/Comments:    Subjective:  Knee is feeling better, little to no pain. Still feels a little stiff when getting up or if she stands too long, typically takes 5-10 minutes for pain to set in. Really feels that the Synvisc is working.       Objective:  Observation:  ?? Significant knee genu valgum in standing  ?? Quad Recruitment: fair + bilaterally  ?? Effusion: medial and lateral aspects of infra patella, "pocket" of fluid, seen bilaterally  ?? Palpation: + L lateral/infra patella, gerdy's tubercle  ?? Gait: Decreased step length, increased lateral sway.  ?? Patella mobility: good  Flexibility  L   R   Comment     Hamstring  SLR 80 degrees  SLR 80 degrees     Gastroc  Mild tightness  Mild tightness     Quad  Mod tightness  - tightness     ROM  PROM   AROM   Overpressure   Comment      L   R   L   R   L   R      Flexion  122  134  120  130       Extension  -4  -3  -2  0       Strength  L   R   Comment     Quad  4-/5  4/5     Hamstring  4-/5  4/5     Gastroc  4+/5  4+/5     Hip flex  4/5  4/5     Glute max  4/5  4/5     Glute med  4/5  4/5     Glute min  4-/5  4-/5          Assessment:  Summary: PROM/AROM knee flexion remains unchanged, extension deficits persist but pt has made good progress and ext is [facility]. Pt has made slight strength improvements as noted by ability to increase weights on PRE machines and her ability to perform exercises with less fatigue; however MMT scores remain relatively unchanged. Quad strength improved slightly as pt can now perform SLR without an extension lag. LEFS improved by 15 points. Pt requires continued PT to address ROM and strength deficits.     Progress towards goals: See goal status    Current Frequency/Duration:  # Days per week: _0  1 day # Weeks: _1  1 week _2  4 weeks      _3  2 days?   _4  2 weeks _5  5 weeks      _6  3 days   _7  3 weeks _8  6 weeks     Rehab Potential: _9  Excellent _10  Good _11  Fair  _12  Poor     Goal Status:  _13  Achieved _14  Partially Achieved  _15  Not  Achieved    Short term goals  Time Frame for Short term goals: 2 weeks 02/26/14  Short term goal 1: Independent HEP MET  Short term goal 2: Decrease pain by 1-2 grades MET  Short term goal 3: Improve PROM knee flexion by 5-10 degrees Not met  Short term goal 4: Improve AROM knee flexion by 5 degrees or greater Not met    Long term goals  Time Frame for Long term goals : 4-6 weeks 03/12/14-03/26/14  Long term goal 1: PROM 0-130 degrees or greater Not met  Long term goal 2: AROM 0-125 degrees or greater Not met  Long term goal 3: Pain free at rest; pain 0-2/10 with ADLs Progressing  Long term goal 4: BLUE grossly 4/5 or greater Progressing  Long term goal 5: Improve LEFS score to reflect 1-20% deficit Progressing     Patient Status: _16  Continue per initial plan of Care     _17  Patient now discharged     _18  Additional visits requested, Please re-certify for additional visits:      Requested frequency/duration:  X/week for weeks    Electronically signed by:  Colonel Bald, PT, DPT    If you have any questions or concerns, please don't hesitate to call.  Thank you for your referral.    Physician Signature:______  Zadie Rhine MD  Board Certified Orthopaedic Surgeon  Knee and Shoulder Surgery Specialist    Electronically signed by Zadie Rhine MD on 03/17/2014 at 1:55 PM   __________________________Date:__________________  By signing above, therapist???s plan is approved by physician

## 2014-04-02 NOTE — Other (Signed)
[facility], [facility]        Physical Therapy Daily Treatment Note  Date:  04/02/2014    Patient Name:  Molly [name]    DOB:  March 15, 1961  MRN: [id number]  Restrictions/Precautions:   Medical/Treatment Diagnosis Information:   ?? Diagnosis: L knee OA, DJD, chondromalacia  ?? Treatment Diagnosis: L knee pain, decreased ROM and strength  ICD-9: 536.14  Insurance/Certification information:  PT Insurance Information: PT BENEFITS 2015/ [employer]/ DONNA/ EFFECTIVE 10-28-12/ ACTIVE/ 1200 DED MET/ PAYS 90%/ 3000 OOP/30 VPCY/ REF# DONNA 02-07-14 10:55/ PAG    Physician Information:  Referring Practitioner: Zadie Rhine, MD  Plan of care signed (Y/N):   Visit# / total visits:  9/30  Pain level: 3/10     G-Code (if applicable):      Date / Visit # G-Code Applied:  03-03-2014 visit #1  PT G-Codes  Functional Assessment Tool Used: LEFS  Score: 30/80=37.5% (62.5% deficit)    03/17/14 Score 45/80=56.25% (43.75% deficit)  Functional Limitation: Mobility: Walking and moving around  Mobility: Walking and Moving Around Current Status (E3154): At least 60 percent but less than 80 percent impaired, limited or restricted  Mobility: Walking and Moving Around Goal Status [phone]): At least 1 percent but less than 20 percent impaired, limited or restricted    Progress Note: []   Yes  [x]   No  Next due by: week of 04/14/14      Subjective: Pt has been on vacation camping for the last few weeks. She did a lot of walking at the camp ground and on uneven ground and the knee felt pretty good, only gave out on her once. Pt states "this morning was the worst day in awhile for the knee" and "the pain is completely almost gone." Stairs are still challenging.     Objective:  Observation:  ?? Significant knee genu valgum in standing  ?? Quad Recruitment: fair + bilaterally  ?? Effusion: medial and lateral aspects of infra patella, "pocket" of fluid, seen bilaterally  ?? Palpation: + L lateral/infra patella,  gerdy's tubercle  ?? Gait: Decreased step length, increased lateral sway.  ?? Patella mobility: good  Flexibility  L  R  Comment    Hamstring  SLR 80 degrees  SLR 80 degrees     Gastroc  Mild tightness  Mild tightness     Quad  Mod tightness  - tightness       ROM  PROM  AROM  Overpressure  Comment     L  R  L  R  L  R     Flexion  122  134  120  130       Extension  -4  -3  -2  0         Strength  L  R  Comment    Quad  4-/5  4/5     Hamstring  4-/5  4/5     Gastroc  4+/5  4+/5     Hip flex  4/5  4/5     Glute max  4/5  4/5     Glute med  4/5  4/5     Glute min  4-/5  4-/5       Special Test  Results/Comment    Meniscal Click  -    Crepitus  +    Valgus Laxity  -    Varus Laxity  -        Exercises:  Exercise/Equipment Resistance/Repetitions Other comments   Stretching     Hamstring 5x30" prop   Hip Flexion     ITB     Grion Quad     Inclined Calf 3x30"    Towel Pull          SLR     Supine 3x10    Prone     Abduction 3x10    Adducton     SLR+     Clam shells 3x10 Blue band   Bridging 3x10         Isometrics     Quad sets    Adduction         Patellar Glides     Medial     Superior     Inferior          ROM     Passive     Active     Weight Shift     Hang Weights     Sheet Pulls     Ankle Pumps          CKC     Calf raises 3x15    Wall sits 3x2' 30 deg flexion, pain with further flexion   Step ups/over 3x10 L2    1 leg stand     Squatting     CC TKE 3x15 black   Balance 3x30" tandem bilateral        PRE     Extension 3x10 20#   RANGE:   Flexion 3x10 40# RANGE:        Cable Column          Leg Press  RANGE:        Bike     Treadmill            Other Therapeutic Activities: N/A    Home Exercise Program:   Initiated 02/12/14. Printed hand out given. Pt demonstrated proper form of each exercise and expressed verbal understanding of frequency and duration.     Patient Education:      02/12/14 Pt educated on diagnosis, prognosis and PT plan of care. Educated on [place]. Pt questions were addressed and answered.  03/03/14 Pt  educated on used of home EMPI unit.  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  A muscle stim device with conductive garment is being prescribed to facilitate strengthening of their quad contraction.  This is in accordance with the therapist's established rehabilitation plan to treat quad atrophy.    Colonel Bald, PT    Timed Code Treatment Minutes: 53 (TEx30 TAx23) +EMS    Total Treatment Minutes: 85    Assessment:  [x]  Patient tolerated treatment well []  Patient limited by fatigue  []  Patient limited by pain  []  Patient limited by other medical complications  [x]  Other: Quad weakness persists. Able to increase step height and PRE weight. Pt requires continued PT to address ROM and strength deficits.    Plan for Next Session: Progress as tolerated    Prognosis: []  Good [x]  Fair  []  Poor    Patient Requires Follow-up: [x]  Yes  []  No  Goals:  Short term goals  Time Frame for Short term goals: 2 weeks 02/26/14  Short term goal 1: Independent HEP MET  Short term goal 2: Decrease pain by 1-2 grades MET  Short term goal 3: Improve PROM knee flexion by 5-10 degrees Not met  Short term goal 4: Improve AROM knee flexion by 5 degrees or greater Not met    Long term goals  Time Frame for Long term goals : 4-6 weeks 03/12/14-03/26/14  Long term goal 1: PROM 0-130 degrees or greater Not met  Long term goal 2: AROM 0-125 degrees or greater Not met  Long term goal 3: Pain free at rest; pain 0-2/10 with ADLs Progressing  Long term goal 4: BLUE grossly 4/5 or greater Progressing  Long term goal 5: Improve LEFS score to reflect 1-20% deficit Progressing      Plan:   [x]  Continue per plan of care []  Alter current plan (see comments)  []  Plan  of care initiated []  Hold pending MD visit []  Discharge    MD Follow-up:     Electronically signed by:  Shela Leff, DPT

## 2014-04-09 NOTE — Other (Signed)
[facility], Molly [name]        Physical Therapy Daily Treatment Note  Date:  04/09/2014    Patient Name:  Molly [name]    DOB:  29-Mar-1961  MRN: [id number]  Restrictions/Precautions:   Medical/Treatment Diagnosis Information:   ?? Diagnosis: L knee OA, DJD, chondromalacia  ?? Treatment Diagnosis: L knee pain, decreased ROM and strength  ICD-9: 109.32  Insurance/Certification information:  PT Insurance Information: PT BENEFITS 2015/ [employer]/ Molly [name]/ EFFECTIVE 10-28-12/ ACTIVE/ 1200 DED MET/ PAYS 90%/ 3000 OOP/30 VPCY/ REF# Molly [name] 02-07-14 10:55/ PAG    Physician Information:  Referring Practitioner: Molly Rhine, MD  Plan of care signed (Y/N):   Visit# / total visits:  9/30 (*incorrect count on prior notes)  Pain level: 1/10     G-Code (if applicable):      Date / Visit # G-Code Applied:  02-20-2014 visit #1  PT G-Codes  Functional Assessment Tool Used: LEFS  Score: 30/80=37.5% (62.5% deficit)    03/17/14 Score 45/80=56.25% (43.75% deficit)  04/09/14 49/80=61.25% (38.75% deficit)  Functional Limitation: Mobility: Walking and moving around  Mobility: Walking and Moving Around Current Status (T5573): At least 40 percent but less than 60 percent impaired, limited or restricted  Mobility: Walking and Moving Around Goal Status [id number]): At least 1 percent but less than 20 percent impaired, limited or restricted    Progress Note: [x]  Yes  []  No      Subjective: Pt states the wall sits caused a lot of pain for two days after PT. Otherwise knee has been a lot more stable, doesn't feel like it's going to give out any more. Minimal pain unless she is up too long at work. Worked over 40 hours last week and was short staffed, had to do the stairs at [employer], went okay, took it slow. Twisting and quick turning is most difficult for her, not painful but really takes her time because she is fearful that it will hurt or give it. She has started taking her dog for walks again,  2x/wk for about 0.5-0.75 miles. Overall pt is feeling much better, feeling much more optimistic since beginning PT.    Objective:  Observation:  ?? Significant knee genu valgum in standing  ?? Quad Recruitment: fair + bilaterally  ?? Effusion: medial and lateral aspects of infra patella, "pocket" of fluid, seen bilaterally  ?? Palpation: slight TTP lateral joint line  ?? Gait: Decreased step length, increased lateral sway.  ?? Patella mobility: good  Flexibility  L  R  Comment    Hamstring  SLR 80 degrees  SLR 80 degrees     Gastroc  Mild tightness  Mild tightness     Quad  Mod tightness  - tightness       ROM  PROM  AROM  Overpressure  Comment     L  R  L  R  L  R     Flexion  125  134  124  130       Extension  -4  -3  -2  0         Strength  L  R  Comment    Quad  4/5  4/5     Hamstring  4/5  4/5     Gastroc  4+/5  4+/5     Hip flex  4/5  4/5     Glute max  4+/5  4+/5     Glute  med  4/5  4/5     Glute min  4/5  4/5       Special Test  Results/Comment    Meniscal Click  -    Crepitus  +    Valgus Laxity  -    Varus Laxity  -        Exercises:  Exercise/Equipment Resistance/Repetitions Other comments   Stretching     Hamstring 3x30" prop   Hip Flexion     ITB     Grion Quad 3x30" prone   Inclined Calf 3x30"    Towel Pull          SLR     Supine 3x10    Prone     Abduction 3x10    Adducton     SLR+ 10x10"    Clam shells 3x10 Blue band   Bridging 3x10         Isometrics     Quad sets    Adduction         Patellar Glides     Medial     Superior     Inferior          ROM     Passive     Active     Weight Shift     Hang Weights     Sheet Pulls     Ankle Pumps          CKC     Calf raises 3x15    Wall sits 30 deg flexion, pain with further flexion   Step ups/over    1 leg stand    Squatting    CC TKE black   Balance 3x30" tandem bilateral        PRE     Extension RANGE:   Flexion RANGE:        Cable Column          Leg Press  RANGE:        Bike     Treadmill            Other Therapeutic Activities: N/A    Home Exercise  Program:   Initiated 02/12/14. Printed hand out given. Pt demonstrated proper form of each exercise and expressed verbal understanding of frequency and duration. New program given 04/09/14.      Patient Education:      02/12/14 Pt educated on diagnosis, prognosis and PT plan of care. Educated on Epes. Pt questions were addressed and answered.  03/03/14 Pt educated on used of home EMPI unit.   04/09/14 Educated on updated HEP and duration/frequency.    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  A muscle stim device with conductive garment is being prescribed to facilitate strengthening of their quad contraction.  This is in accordance with the therapist's established rehabilitation plan to treat quad atrophy.    Molly [name], PT    Timed Code Treatment Minutes: 53 (TEx30 TAx23)    Total Treatment Minutes: 68    Assessment:  [x] Patient tolerated treatment well [] Patient limited by fatigue  [] Patient limited by pain  [] Patient  limited by other medical complications  [x] Other: Pt has made good progress since beginning PT. Her HS flexibility has improved 10 degrees, knee flexion has improved about 7 degrees and knee extension about 6 degrees, her AROM and PROM is now [facility]. Very good strength improvements, LE MMT grossly 4/5 to 4+/5. Pt has made good gains in quad recruitment as she can now perform SLR without an extension lag and without pain. Pt is now able to ambulate on level surfaces without pain or giving [name] which has improved her ability to perform ADLs and IADLs. Despite improvements weakness and ROM deficits persist; however, pt demonstrate very good understanding of exercise program and is able to perform all exercise with proper  form and self correct when form is off, therefore she no longer requires skilled PT and can continued independently with a HEP. Pt is agreement with discharge and feels confident about her ability to continue on her own.    Prognosis: [] Good [x] Fair  [] Poor    Patient Requires Follow-up: [x] Yes  [] No      Goals:  Short term goals  Time Frame for Short term goals: 2 weeks 02/26/14  Short term goal 1: Independent HEP MET  Short term goal 2: Decrease pain by 1-2 grades MET  Short term goal 3: Improve PROM knee flexion by 5-10 degrees MET  Short term goal 4: Improve AROM knee flexion by 5 degrees or greater MET    Long term goals  Time Frame for Long term goals : 4-6 weeks 03/12/14-03/26/14  Long term goal 1: PROM 0-130 degrees or greater Not met  Long term goal 2: AROM 0-125 degrees or greater Not met  Long term goal 3: Pain free at rest; pain 0-2/10 with ADLs MET  Long term goal 4: BLUE grossly 4/5 or greater MET  Long term goal 5: Improve LEFS score to reflect 1-20% deficit Not met      Plan:   [] Continue per plan of care [] Alter current plan (see comments)  [] Plan of care initiated [] Hold pending MD visit [x] Discharge    MD Follow-up:     Electronically signed by:  Shela Leff, DPT

## 2014-04-09 NOTE — Progress Notes (Signed)
[facility] [place] [place]       Physical Therapy        Phone: [phone]  [x]  [place]: Fax: [phone]   []  [place]: Fax: [phone]    []  [place]:           Fax: [phone]  []  [place]:   Fax: [phone]   []  [place]:     Fax: [phone]        Physical Therapy Progress/Discharge Note  Date: 26-Apr-2014        Patient Name:  Molly [name]    DOB:  10/01/1960  MRN: [id number]  Restrictions/Precautions:   Medical/Treatment Diagnosis Information:   ?? Diagnosis: L knee OA, DJD, chondromalacia  ?? Treatment Diagnosis: L knee pain, decreased ROM and strength  ICD-9: 919.16  Insurance/Certification information: PT Insurance Information: PT BENEFITS 2015/ [employer]/ DONNA/ EFFECTIVE 10-28-12/ ACTIVE/ 1200 DED MET/ PAYS 90%/ 3000 OOP/30 VPCY/ REF# DONNA 02-07-14 10:55/ PAG   Physician Information: Referring Practitioner: Zadie Rhine, MD  Plan of care signed (Y/N):   Visit# / total visits: 9/30  Pain level: 1/10     G-Code (if applicable):      Date G-Code Applied:  04-26-2014 visit #9  PT G-Codes  Functional Assessment Tool Used: LEFS  Score: 49/80=61.25% (38.75% deficit)  Functional Limitation: Mobility: Walking and moving around  Mobility: Walking and Moving Around Current Status (O0600): At least 20 percent but less than 40 percent impaired, limited or restricted  Mobility: Walking and Moving Around Goal Status [phone]): At least 1 percent but less than 20 percent impaired, limited or restricted  Mobility: Walking and Moving Around Discharge Status [phone]): At least 20 percent but less than 40 percent impaired, limited or restricted     Time Period for Report:  02/12/14-04/26/14  Cancels/No-shows to date: 0    Plan of Care/Treatment to date:  [x]  Therapeutic Exercise    [x]  Modalities:  [x]  Therapeutic Activity     []  Ultrasound  [x]  Electrical Stimulation  [x]  Gait Training      []  Cervical Traction    []  Lumbar Traction  [x]  Neuromuscular  Re-education  [x]  Cold/hotpack []  Iontophoresis  [x]  Instruction in HEP      Other:  []  Manual Therapy       []     []  Aquatic Therapy       []                     ?      Significant Findings At Last Visit/Comments:    Subjective:  Pt states that the knee hurt after last PT session, otherwise knee has been a lot more stable, doesn't feel like it's going to give out any more. Minimal pain unless she is up too long at work. Worked over 40 hours last week and was short staffed, had to do the stairs at [employer], went okay, took it slow. Twisting and quick turning is most difficult for her, not painful but really takes her time because she is fearful that it will hurt or give it. She has started taking her dog for walks again, 2x/wk for about 0.5-0.75 miles. Overall pt is feeling much better, feeling much more optimistic since beginning PT.      Objective:  Observation:  ?? Significant knee genu valgum in standing  ?? Quad Recruitment: fair + bilaterally  ?? Effusion: medial and lateral aspects of infra patella, "pocket" of fluid, seen bilaterally  ?? Palpation: slight  TTP lateral joint line  ?? Gait: Decreased step length, increased lateral sway.  ?? Patella mobility: good  Flexibility  L   R   Comment     Hamstring  SLR 80 degrees  SLR 80 degrees     Gastroc  Mild tightness  Mild tightness     Quad  Mod tightness  - tightness     ROM  PROM   AROM   Overpressure   Comment      L   R   L   R   L   R      Flexion  125  134  124  130       Extension  -4  -3  -2  0       Strength  L   R   Comment     Quad  4/5  4/5     Hamstring  4/5  4/5     Gastroc  4+/5  4+/5     Hip flex  4/5  4/5     Glute max  4+/5  4+/5     Glute med  4/5  4/5     Glute min  4/5  4/5          Assessment:  Summary: Pt has made good progress since beginning PT. Her HS flexibility has improved 10 degrees, knee flexion has improved about 7 degrees and knee extension about 6 degrees, her AROM and PROM is now [facility]. Very good strength improvements, LE MMT  grossly 4/5 to 4+/5. Pt has made good gains in quad recruitment as she can now perform SLR without an extension lag and without pain. Pt is now able to ambulate on level surfaces without pain or giving way which has improved her ability to perform ADLs and IADLs. Despite improvements weakness and ROM deficits persist; however, pt demonstrate very good understanding of exercise program and is able to perform all exercise with proper form and self correct when form is off, therefore she no longer requires skilled PT and can continued independently with a HEP. Pt is agreement with discharge and feels confident about her ability to continue on her own.     Rehab Potential: []  Excellent [x]  Good []  Fair  []  Poor     Goal Status:  []  Achieved [x]  Partially Achieved  []  Not Achieved     Short term goals  Time Frame for Short term goals: 2 weeks 02/26/14  Short term goal 1: Independent HEP MET  Short term goal 2: Decrease pain by 1-2 grades MET  Short term goal 3: Improve PROM knee flexion by 5-10 degrees MET  Short term goal 4: Improve AROM knee flexion by 5 degrees or greater MET    Long term goals  Time Frame for Long term goals : 4-6 weeks 03/12/14-03/26/14  Long term goal 1: PROM 0-130 degrees or greater Progressing  Long term goal 2: AROM 0-125 degrees or greater Progressing  Long term goal 3: Pain free at rest; pain 0-2/10 with ADLs MET  Long term goal 4: BLUE grossly 4/5 or greater MET  Long term goal 5: Improve LEFS score to reflect 1-20% deficit Progressing         Patient Status: []  Continue per initial plan of Care     [x]  Patient now discharged     []  Additional visits requested, Please re-certify for additional visits:      Requested frequency/duration:  X/week for weeks  Electronically signed by:  Colonel Bald, PT, DPT    If you have any questions or concerns, please don't hesitate to call.  Thank you for your referral.    Physician Signature:________________________________Date:__________________  By signing  above, therapist???s plan is approved by physician

## 2014-04-21 ENCOUNTER — Telehealth: Payer: Self-pay | Admitting: Internal Medicine

## 2014-04-21 NOTE — Telephone Encounter (Signed)
Patient states her current nasal spray does not work as well and she would like to go back to [facility]. She uses [facility] on [address].

## 2014-04-22 ENCOUNTER — Encounter

## 2014-04-22 MED ORDER — HYDROXYCHLOROQUINE SULFATE 200 MG PO TABS
200 MG | ORAL_TABLET | Freq: Two times a day (BID) | ORAL | Status: DC
Start: 2014-04-22 — End: 2014-09-23

## 2014-04-22 MED ORDER — DULOXETINE HCL 60 MG PO CPEP
60 MG | ORAL_CAPSULE | Freq: Every day | ORAL | Status: DC
Start: 2014-04-22 — End: 2014-09-23

## 2014-04-22 MED ORDER — FLUTICASONE PROPIONATE 50 MCG/ACT NA SUSP: 2.0000 | Freq: Every day | NASAL | Status: DC

## 2014-04-22 NOTE — Telephone Encounter (Signed)
flonase done erx 

## 2014-04-22 NOTE — Progress Notes (Signed)
[name]  [facility]  Rheumatology Progress Note  Harley Hallmark, MD           [ x  ] [name]        [   ] [facility]                                      [address]                  [address]  [address]                     [place], [place] [address]                [place], [place] [address]      [phone]                [phone]      Fax: [phone]                 Fax: [phone]                          Patient Name:  Molly [name] is a 53 y.o. patient     Returns today for followup. Since last seen, she Remains on Plaquenil 200 mg twice daily and Voltaren gel. Has been doing well on these medications and Continues to notice much improvement in her peripheral joints. Since last visit she started Cymbalta and currently is taking 60 mg daily in the morning. She has noticed tremendous improvement in her left knee symptoms and her overall sense of well-being. However since being started on the Cymbalta she's noticed that she's had more fatigue.She even states that her anxiety has improved while on it. She denies having any mouth sores or ulcers, skin rashes. However she has noticed that she's had dryness of her lips. Has been using Aquaphor which has helped her symptoms.    Past medical/surgical history, medications and allergies are reviewed and updated as appropriate.    Allergies   Allergen Reactions   ??? Sulfa Antibiotics Rash       Past Medical History:        Diagnosis Date   ??? Inflammatory arthritis (HCC)      see Dr Lehman Prom   ??? History of non-Hodgkin's lymphoma 2006     treated with radiation and chemo   ??? Obesity    ??? Diabetes mellitus (HCC)    ??? Cancer (HCC)    ??? Amenorrhea        Past Surgical History:        Procedure Laterality Date   ??? Leep       in 1990's   ??? Knee surgery       ACL and meniscus repair       Medications :    Prior to Admission medications    Medication Sig Start Date End Date Taking?  Authorizing Provider   DULoxetine (CYMBALTA) 60 MG capsule Take 60 mg by mouth daily   Yes Historical Provider, MD   acetaminophen (TYLENOL) 325 MG tablet Take 650 mg by mouth every 6 hours as needed for Pain   Yes Historical Provider, MD   vitamin D (ERGOCALCIFEROL) 50000 UNITS CAPS capsule Take one capsule once a week for 12  weeks, then take  2000 units over-the-counter vitamin D 3 daily with the largest meal of the day. 03/04/14  Yes Harley Hallmark, MD   traMADol (ULTRAM) 50 MG TABS Take 50 mg by mouth every 6 hours as needed Take one tab by mouth every 6 hours for pain as needed. 02/28/14  Yes Lisabeth Register, MD   diclofenac sodium (VOLTAREN) 1 % GEL Below the waist apply 4 gram 4 times a day 02/07/14  Yes Lisabeth Register, MD   nabumetone (RELAFEN) 750 MG tablet Take 1 tablet by mouth 2 times daily 01/07/14  Yes Harley Hallmark, MD   hydroxychloroquine (PLAQUENIL) 200 MG tablet Take 1 tablet by mouth 2 times daily 01/07/14  Yes Harley Hallmark, MD   Cholecalciferol (VITAMIN D3) 2000 UNITS CAPS Take  by mouth.   Yes Historical Provider, MD         Review of Systems:    GENERAL:Denies having any fatigue or unintentional weight loss  HEENT:  Denies having any  Oral lesions, Or sores;lip dryness  LUNGS:Denies having had any new breathing difficulties  ZO:XWRUEA having any GI upset from medications  MUSCULOSKELETAL:Denies having any joint pain currently  SKIN: New skin lesions mainly involving bilateral lower extremities  LYMPHADENOPATHY: denies having had any recent infectious illness or lymphadenopathy      Physical Exam:    BP 120/80 mmHg   Pulse 84   Wt 296 lb (134.265 kg)    GENERAL:Able to ambulate without any assistance.  HEENT: no evidence of any oral ulcers, lesions or sores  LUNGS:clear to auscultation bilaterally, good air intake  CARDIOVASCULAR:regular rate  MUSCULOSKELETAL:Warmth, swelling, tenderness involving the left knee. Otherwise no evidence of synovitis swelling warmth or tenderness of patient's upper or lower  peripheral joints.  Skin:Skin is warm and dry. No Petechiae, telangiectasia, purpura, or rash. Palpation of the skin and subcutaneous tissues reveals no evidence of induration, subcutaneous nodule or tenderness. NotedUpper and lower extremity maculopapular lesions not erythematous  Lymphadenopathy: no evidence of any palpable cervical or supraclavicular lymph nodes    Labs:     Lab Results   Component Value Date    WBC 6.5 03/04/2014    HGB 13.8 03/04/2014    HCT 42.7 03/04/2014    MCV 96.4 03/04/2014    PLT 314 03/04/2014       Chemistry        Component Value Date/Time    NA 138 03/04/2014 1220    K 5.0 03/04/2014 1220    CL 98* 03/04/2014 1220    CO2 26 03/04/2014 1220    BUN 15 03/04/2014 1220    CREATININE 0.6 03/04/2014 1220        Component Value Date/Time    CALCIUM 10.0 03/04/2014 1220    ALKPHOS 94 03/04/2014 1220    AST 17 03/04/2014 1220    ALT 15 03/04/2014 1220    BILITOT 0.4 03/04/2014 1220          Lab Results   Component Value Date    SEDRATE 21 03/04/2014     Lab Results   Component Value Date    ANA POSITIVE 03/04/2014    C3 172.3 03/04/2014    C3 153.4 11/29/2013    C4 30.1 03/04/2014    C4 28.3 11/29/2013           Assessment/Plan:      Assessment/Plan:       Assessment/Plan:  Molly [name] was seen today for follow-up.    Diagnoses and associated orders for  this visit:    Systemic lupus (HCC)-Found to have an elevated ANA and double-stranded DNA with a borderline positive rheumatoid factor titer.With improvement and currently stable on Plaquenil 200 mg taken twice daily.Using the Relafen on a p.r.n. Basis.Good prognosis    - hydroxychloroquine (PLAQUENIL) 200 MG tablet; Take 1 tablet by mouth 2 times daily  - CBC Auto Differential  - Comprehensive Metabolic Panel  - ANA  - C-Reactive Protein  - Urinalysis  - Sedimentation Rate  - Anti-DNA Antibody, Double-Stranded  - C3 Complement  - C4 Complement    Primary osteoarthritis of one knee-Much improvement since starting Cymbalta 60 mg daily. However having daytime fatigue.  Overall doing well.    - DULoxetine (CYMBALTA) 60 MG capsule; Take 1 capsule by mouth daily  -     Advised her to discuss with Dr. Caralee Ates if she would benefit from Uniloader brace to wear during her long work hours      Skin lesions-most likely secondary to sun exposure. Fair skin. Having history of systemic lupus she most likely would benefit from routine annual skin examination. Plan on referring to dermatology for followup.    Return in about 4 months (around 08/22/2014).    Letitia Libra  [facility]  [facility]  [address]  [place], [place] [address]  Phone:[phone]  [phone]    NOTE: This report is transcribed by using voice recognition software dragon. Every effort is made to ensure accuracy; however, inadvertent computerized transcription errors may be present.                     Harley Hallmark, MD, 04/22/2014 11:48 AM

## 2014-04-22 NOTE — Patient Instructions (Signed)
1.Continue with current medications as indicated      2. Check laboratory studies today to evaluate for any evidence of drug toxicity or side effects      3. Return visit for followup in 4 months or sooner if needed    Lab Results   Component Value Date    WBC 6.5 03/04/2014    HGB 13.8 03/04/2014    HCT 42.7 03/04/2014    MCV 96.4 03/04/2014    PLT 314 03/04/2014       Chemistry        Component Value Date/Time    NA 138 03/04/2014 1220    K 5.0 03/04/2014 1220    CL 98* 03/04/2014 1220    CO2 26 03/04/2014 1220    BUN 15 03/04/2014 1220    CREATININE 0.6 03/04/2014 1220        Component Value Date/Time    CALCIUM 10.0 03/04/2014 1220    ALKPHOS 94 03/04/2014 1220    AST 17 03/04/2014 1220    ALT 15 03/04/2014 1220    BILITOT 0.4 03/04/2014 1220          Lab Results   Component Value Date    SEDRATE 21 03/04/2014     Lab Results   Component Value Date    CRP 5.1 11/29/2013     4.Try taking Cymbalta before bedtime to improve your fatigue.  Marland Kitchen

## 2014-04-22 NOTE — Progress Notes (Signed)
Referral sent

## 2014-04-23 LAB — URINALYSIS
Bilirubin Urine: NEGATIVE
Blood, Urine: NEGATIVE
Glucose, Ur: NEGATIVE mg/dL
Ketones, Urine: NEGATIVE mg/dL
Leukocyte Esterase, Urine: NEGATIVE
Nitrite, Urine: NEGATIVE
Protein, UA: NEGATIVE mg/dL
Specific Gravity, UA: 1.015 (ref 1.005–1.030)
Urobilinogen, Urine: 0.2 E.U./dL (ref <2.0)
pH, UA: 6 (ref 5.0–8.0)

## 2014-04-23 LAB — COMPREHENSIVE METABOLIC PANEL
ALT: 11 U/L (ref 10–40)
AST: 19 U/L (ref 15–37)
Albumin/Globulin Ratio: 1.3 (ref 1.1–2.2)
Albumin: 4.4 g/dL (ref 3.4–5.0)
Alkaline Phosphatase: 86 U/L (ref 40–129)
Anion Gap: 18 — ABNORMAL HIGH (ref 3–16)
BUN: 11 mg/dL (ref 7–20)
CO2: 24 mmol/L (ref 21–32)
Calcium: 9.5 mg/dL (ref 8.3–10.6)
Chloride: 97 mmol/L — ABNORMAL LOW (ref 99–110)
Creatinine: 0.6 mg/dL (ref 0.6–1.1)
GFR African American: >60 (ref >60)
GFR Non-African American: >60 (ref >60)
Globulin: 3.3 g/dL
Glucose: 95 mg/dL (ref 70–99)
Potassium: 4.2 mmol/L (ref 3.5–5.1)
Sodium: 139 mmol/L (ref 136–145)
Total Bilirubin: 0.5 mg/dL (ref 0.0–1.0)
Total Protein: 7.7 g/dL (ref 6.4–8.2)

## 2014-04-23 LAB — CBC WITH AUTO DIFFERENTIAL
Basophils %: 0.5 %
Basophils Absolute: 0 10*3/uL (ref 0.0–0.2)
Eosinophils %: 2.9 %
Eosinophils Absolute: 0.2 10*3/uL (ref 0.0–0.6)
Hematocrit: 41.2 % (ref 36.0–48.0)
Hemoglobin: 13.5 g/dL (ref 12.0–16.0)
Lymphocytes %: 29.9 %
Lymphocytes Absolute: 1.7 10*3/uL (ref 1.0–5.1)
MCH: 32 pg (ref 26.0–34.0)
MCHC: 32.8 g/dL (ref 31.0–36.0)
MCV: 97.6 fL (ref 80.0–100.0)
MPV: 8 fL (ref 5.0–10.5)
Monocytes %: 12.8 %
Monocytes Absolute: 0.7 10*3/uL (ref 0.0–1.3)
Neutrophils %: 53.9 %
Neutrophils Absolute: 3 10*3/uL (ref 1.7–7.7)
Platelets: 320 10*3/uL (ref 135–450)
RBC: 4.22 M/uL (ref 4.00–5.20)
RDW: 13.3 % (ref 12.4–15.4)
WBC: 5.6 10*3/uL (ref 4.0–11.0)

## 2014-04-23 LAB — C3 COMPLEMENT: C3 Complement: 141.9 mg/dL (ref 90.0–180.0)

## 2014-04-23 LAB — ANA
ANA Titer: 1:320 {titer}
ANA: POSITIVE — AB

## 2014-04-23 LAB — SEDIMENTATION RATE: Sed Rate: 18 mm/Hr (ref 0–30)

## 2014-04-23 LAB — C-REACTIVE PROTEIN: CRP: 4.2 mg/L (ref 0.0–5.1)

## 2014-04-23 LAB — C4 COMPLEMENT: C4 Complement: 25.3 mg/dL (ref 10.0–40.0)

## 2014-04-24 ENCOUNTER — Encounter

## 2014-04-24 LAB — CBC WITH AUTO DIFFERENTIAL
Basophils %: 0.3 %
Basophils Absolute: 0 10*3/uL (ref 0.0–0.2)
Eosinophils %: 2.5 %
Eosinophils Absolute: 0.1 10*3/uL (ref 0.0–0.6)
Hematocrit: 41.9 % (ref 36.0–48.0)
Hemoglobin: 13.6 g/dL (ref 12.0–16.0)
Lymphocytes %: 30.3 %
Lymphocytes Absolute: 1.7 10*3/uL (ref 1.0–5.1)
MCH: 31.4 pg (ref 26.0–34.0)
MCHC: 32.5 g/dL (ref 31.0–36.0)
MCV: 96.6 fL (ref 80.0–100.0)
MPV: 8.2 fL (ref 5.0–10.5)
Monocytes %: 14.9 %
Monocytes Absolute: 0.8 10*3/uL (ref 0.0–1.3)
Neutrophils %: 52 %
Neutrophils Absolute: 2.8 10*3/uL (ref 1.7–7.7)
Platelets: 320 10*3/uL (ref 135–450)
RBC: 4.34 M/uL (ref 4.00–5.20)
RDW: 13 % (ref 12.4–15.4)
WBC: 5.5 10*3/uL (ref 4.0–11.0)

## 2014-04-24 LAB — COMPREHENSIVE METABOLIC PANEL
ALT: 15 U/L (ref 10–40)
AST: 19 U/L (ref 15–37)
Albumin/Globulin Ratio: 1.4 (ref 1.1–2.2)
Albumin: 4.5 g/dL (ref 3.4–5.0)
Alkaline Phosphatase: 87 U/L (ref 40–129)
Anion Gap: 14 (ref 3–16)
BUN: 13 mg/dL (ref 7–20)
CO2: 25 mmol/L (ref 21–32)
Calcium: 9.4 mg/dL (ref 8.3–10.6)
Chloride: 98 mmol/L — ABNORMAL LOW (ref 99–110)
Creatinine: 0.6 mg/dL (ref 0.6–1.1)
GFR African American: >60 (ref >60)
GFR Non-African American: >60 (ref >60)
Globulin: 3.3 g/dL
Glucose: 96 mg/dL (ref 70–99)
Potassium: 4.5 mmol/L (ref 3.5–5.1)
Sodium: 137 mmol/L (ref 136–145)
Total Bilirubin: 0.5 mg/dL (ref 0.0–1.0)
Total Protein: 7.8 g/dL (ref 6.4–8.2)

## 2014-04-24 LAB — VITAMIN D 25 HYDROXY: Vit D, 25-Hydroxy: 14.8 ng/mL — ABNORMAL LOW (ref >=30)

## 2014-04-24 LAB — LIPID PANEL
Cholesterol, Total: 245 mg/dL — ABNORMAL HIGH (ref 0–199)
HDL: 63 mg/dL — ABNORMAL HIGH (ref 40–60)
LDL Calculated: 152 mg/dL — ABNORMAL HIGH (ref <100)
Triglycerides: 149 mg/dL (ref 0–150)
VLDL Cholesterol Calculated: 30 mg/dL

## 2014-04-24 LAB — HEMOGLOBIN A1C
Hemoglobin A1C: 5.2 %
eAG: 102.5 mg/dL

## 2014-04-24 LAB — ANTI-DNA ANTIBODY, DOUBLE-STRANDED: Double Stranded Dna Ab, Igg: NOT DETECTED

## 2014-04-24 NOTE — Progress Notes (Signed)
Subjective:      Patient ID: Molly [name] is a 53 y.o. female.    HPI  Here for yearly wellness exam, need labwork and form filled out  Doing well   Seeing Dr Sullivan Lone regularly, and feels much better since starting on cymbalta  Seeing Dr Caralee Ates for left knee pain, s/p synvisc injection. Seems to be helping  Busy at work, but handling stress better she feels  Trying to eat a healthier diet. Sugar and sweets remain a weakness    Review of Systems   Constitutional: Negative for chills, activity change, appetite change and fatigue.   Respiratory: Negative for cough and shortness of breath.    Cardiovascular: Negative for chest pain.   Gastrointestinal: Negative for nausea, vomiting, diarrhea and constipation.   Genitourinary: Negative for dysuria.   Musculoskeletal:        Left knee pain improving   Skin: Negative for rash.   Neurological: Negative for headaches.   Psychiatric/Behavioral: Negative for behavioral problems, sleep disturbance and dysphoric mood.       Objective:   Physical Exam   Constitutional: She is oriented to person, place, and time. She appears well-developed and well-nourished. No distress.   Pleasant, overweight   HENT:   Head: Normocephalic and atraumatic.   Eyes: Conjunctivae are normal.   Neck: Normal range of motion. Neck supple.   Cardiovascular: Normal rate, regular rhythm and normal heart sounds.    Pulmonary/Chest: Effort normal and breath sounds normal. No respiratory distress.   Abdominal: Soft. Bowel sounds are normal.   Musculoskeletal:   Left knee with mild effusion, linear scar   Neurological: She is alert and oriented to person, place, and time.   Skin: Skin is warm and dry. She is not diaphoretic.   Psychiatric: She has a normal mood and affect. Her behavior is normal. Thought content normal.   Animated, bright affect   Vitals reviewed.      Assessment:      1. Routine general medical examination at a health care facility     2. HLD (hyperlipidemia)  Lipid Panel   3. Vitamin D  deficiency  VITAMIN D 25 HYDROXY   4. Morbid obesity with BMI of 40.0-44.9, adult (HCC)              Plan:      -check bloodwork    -encouraged weight loss; discussed jump starting efforts with a meal replacement program (through [name] [employer] for ex)- Pt will consider    -will need to discuss colon CA screening at next visit

## 2014-04-25 NOTE — Telephone Encounter (Signed)
Please call Pt with recent bloodwork results:    CMP ok  A1c good at 5.2  CBC wnl  Lipids still elevated with TC 245, LDL 152 - rec considering starting on a low dose statin or red yeast rice supplement  Vit D still low at 14 - continue with ergo

## 2014-04-25 NOTE — Progress Notes (Signed)
Quick Note:    CMP ok  A1c good at 5.2  ______

## 2014-04-25 NOTE — Progress Notes (Signed)
Quick Note:    CBC wnl  Lipids still elevated with TC 245, LDL 152 - rec considering starting on a low dose statin or red yeast rice supplement  Vit D still low at 14 - continue with ergo  ______

## 2014-04-29 NOTE — Telephone Encounter (Signed)
Attempted to call patient no answer lvm to give us a call back.

## 2014-05-01 NOTE — Telephone Encounter (Signed)
Attempted to call patient no answer lvmtrc

## 2014-05-07 NOTE — Telephone Encounter (Signed)
Attempted to call patient no answer lvmtrc

## 2014-05-12 NOTE — Progress Notes (Signed)
Lisabeth Register MD  Orthopaedic Surgery & Sports Medicine  Knee & Shoulder Specialist       [facility]    [facility]  [address]        [address]  [place], [place]  [address]                    [place], [place]  [address]  [phone]                    [phone]  [phone] (fax)   [phone] (fax)    MAIN PHONE NUMBER  [phone]      PATIENT: Molly [name]    53 y.o.  female  DATE OF BIRTH: 1960-10-11   MRN:  [id number]       Date of current encounter: 05/12/2014  This encounter is evaluated as a:        New Patient Visit     Established Patient Visit    Post-Op Visit      Consult: requested by          Worker's Comp       Patient's PCP is Dr. Merla Riches, MD    Subjective:     Reason for Visit: left knee injection    Chief Complaint   Patient presents with   ??? Knee Pain     left knee injection, f/u synvisc series       HPI:  Molly [name] is a 53 y.o. year old, Caucasian female complaining of left knee pain.     PAIN ASSESSMENT:   Molly [name] has a pain level on 0/10 scale:  4 out of 10 with activity and 0 out of 10 at rest. Her pain is intermittent and occurs daily.  When the pain occurs, it lasts hours.  She reports that rest makes her pain better and too much activity makes her pain worse. She denies pain at night.  She denies numbness and tingling.  She describes her pain as dull.  Her pain is located at the lateral aspect of her left knee.  Her pain does not radiate.  She feels that the problem is gradually improving.  The pain is not limiting her behavior. She had the Synvisc series completed on March 14 2014 and it helped tremendously.    Patient Active Problem List   Diagnosis   ??? H/O vitamin D deficiency   ??? History of non-Hodgkin's lymphoma   ??? Impaired glucose tolerance   ??? HLD (hyperlipidemia)   ??? Vitamin D deficiency   ??? S/P left ACL reconstruction 1991 by Dr. Henrene Hawking   ??? Chondromalacia patellae of left knee   ??? Tear of lateral meniscus of  left knee   ??? Tear of medial meniscus of left knee   ??? Left knee DJD   ??? Morbid obesity with BMI of 40.0-44.9, adult (HCC)   ??? Acquired valgus deformity knee   ??? Knee effusion       Allergies:  Sulfa antibiotics    Medications:  Outpatient Prescriptions Marked as Taking for the 05/12/14 encounter (Office Visit) with Lisabeth Register, MD   Medication Sig Dispense Refill   ??? acetaminophen (TYLENOL) 325 MG tablet Take 650 mg by mouth every 6 hours as needed for Pain     ??? hydroxychloroquine (PLAQUENIL) 200 MG tablet Take 1 tablet by mouth 2 times daily 180 tablet 3   ??? DULoxetine (CYMBALTA) 60 MG capsule Take 1 capsule by  mouth daily 90 capsule 3   ??? vitamin D (ERGOCALCIFEROL) 50000 UNITS CAPS capsule Take one capsule once a week for 12  weeks, then take  2000 units over-the-counter vitamin D 3 daily with the largest meal of the day. 12 capsule 0   ??? diclofenac sodium (VOLTAREN) 1 % GEL Below the waist apply 4 gram 4 times a day 500 g 2       Molly [name]'s past medical history, family history, social history and review of systems have been reviewed and are recorded in the chart.    Objective:     Physical Exam  Vital Signs:  BP 131/88 mmHg   Ht 5\' 10"  (1.778 m)   Wt 290 lb (131.543 kg)   BMI 41.61 kg/m2    Constitution:  Generally, Molly [name] is [x]  alert, [x]  appears stated age, and [x]  in no distress.  Her general body habitus is []  Cachectic  []  Thin  []  Normal  []  Obese  [x]  Morbidly Obese    Head: [x]  Normocephalic  Eyes: [x]  Extra-occular muscles intact  Left Ear: [x]  External Ear normal   Right Ear: [x]  External Ear normal   Nose: [x]  Normal  Mouth: [x]  Oral mucosa moist  [x]  No perioral lesions    Pulmonary: [x]  Respirations unlabored and regular  Skin: [x]  Warm [x]  Well perfused     Psychiatric:   [x]  Good judgement and insight  [x]  Oriented to [x]  person, [x]  place, and [x]  time  [x]  Mood appropriate for circumstances    Gait:  Gait is []  Normal  [x]  Impaired slight limp  Assistive Device: [x]  None  []   Knee Brace  []  Cane  []  Crutches   []  Walker   []  Wheelchair  []  Other    ORTHOPAEDIC KNEE EXAM: LEFT    Inspection:  [x]  Skin intact without abrasion or lacerations.  []  Ecchymosis:  [x]  none  []  mild  []  moderate  []  severe  []  Atrophy:  [x]  none  []  mild  []  moderate  []  severe  [x]  Effusion: []  none  [x]  mild  []  moderate  []  severe  [x]  Scar / Surgical incision(s): [x]  A-Scope Portals  [x]  Open Surgical Incision(s)    Alignment:  [x]  Normal  []  Varus []  Valgus    Range of Motion:  []  Normal Knee ROM         []  Deferred: acute injury/post-surgery/pain   [x]  Limited ROM: 0-90 degrees with crepitation    Palpation:   []  No Tenderness  [x]  Tenderness: Lateral joint line [x]  mild  []  moderate  []  severe   [x]  Patellofemoral Crepitation:  []  none  []  mild  []  moderate  [x]  severe    Motor Function:   [x]  No gross motor weakness  []  Motor weakness:  []  mild  []  moderate  []  severe     Neurologic:  [x]  Sensation to light touch intact   [x]  Coordination / proprioception intact    Circulation:  [x]  The limb is warm and well perfused  [x]  Capillary refill is intact.  []  Edema  [x]  none  []  mild  []  moderate  []  severe  []  Venous stasis changes  [x]  none  []  mild  []  moderate  []  severe    Contralateral Knee:  [x]  No pain with range of motion    Data Reviewed:     No imaging studies were obtained today.    XRays:  3 views: AP, lateral and patella views) of  her left knee taken on 02/07/2014 showed severe degenerative changes in the patellofemoral compartment, severe degenerative changes in the medial compartment and severe (bone-on-bone) degenerative changes in the lateral compartment. There is severe joint space narrowing and osteophyte formation in the lateral compartment and the patellofemoral compartment as well as medial compartment.  There is valgus alignment. There are retained staples: Two in the distal lateral femur, one in the proximal medial tibia consistent with a previous anterior cruciate ligament  reconstruction with visible tunnels.    MRI:  The MRI of her left knee dated 12/09/2013 shows high-grade chondromalacia patella with tiny subchondral ulceration at the patellar apex. Large joint effusion, complex nature, likely associated with synovial hyperplasia. Advanced degenerative osteoarthritis in the medial and lateral joint space compartments. Horizontal tear posterior medial meniscal horn. Maceration of the anterior lateral meniscus horn with horizontal tear within the meniscal remnant. Intact anterior cruciate ligament graft in isometric position. Popliteal tendon insertion strain or low-grade partial-thickness tear.    Assessment:     Molly [name] is a 53 y.o. year old female with the following diagnosis:    1. Chondromalacia patellae of left knee    2. Primary osteoarthritis of left knee    3. S/P left ACL reconstruction 1991 by Dr. Henrene Hawking    4. Morbid obesity with BMI of 40.0-44.9, adult (HCC)        Her overall course: improved with injections    Plan:      I discussed the diagnosis and the treatment options with Molly [name] today.   In Summary:  1). The various treatment options were outlined and discussed with Molly [name] including:       Conservative care options:      physical therapy, ice, NSAIDs, bracing, and activity modification        The indications for therapeutic injections Steroids and Hyluronic Acid/Synvisc/Euflexxa/Supartz       The indications for (possible future) operative intervention including a total knee replacement in her lifetime    2). After considering the various options discussed, Molly [name] elected to pursue a course of treatment that includes the following:       MEDICATIONS/REFILLS prescribed today are listed below.       No refills today     Physical Therapy/HEP Activities as tolerated        Script: 2 x per week x 4 weeks     Ice to affected area: 15-20 minutes 4 x day     Injections in the future if needed.  At this point she has no  pain.     Return to Clinic/Follow - Up:  Molly [name] was asked to make a follow-up appointment:      In the future if needed for an injection.    Molly [name] was instructed to call the office if her symptoms worsen or if new symptoms appear prior to the next scheduled visit. She is specifically instructed to contact the office between now & her scheduled appointment if she has concerns related to her condition or if she needs assistance in scheduling the above tests. She is welcome to call for an appointment sooner if she has any additional concerns or questions.          Patient Education Materials Provided:    Patient Instructions      I have asked Molly [name] to schedule a follow-up appointment as needed.    She is specifically instructed to contact the  office between now & her scheduled appointment if she has concerns related to her condition. She is welcome to call for an appointment sooner if she has any additional concerns or questions.    Ice ("ICE IS YOUR FRIEND": try using a bag of frozen peas or corn) to injured/painful area for 15 - 20 minutes 3 x day.    General Medication Instructions:  Any prescriptions must be used as directed.  If you have any concerns, questions or require refills, please contact our office.  If you experience any adverse reactions, stop the medication and call our office immediately.    If you designate a preferred pharmacy, appropriate prescriptions will be sent to your preferred pharmacy for pickup to be use as directed.    Patient Driving Instructions:  No driving if you are taking narcotic pain medications or muscle relaxers.    PATIENT REMINDER:   Carry a list of your medications and allergies with you at all times.  Call your pharmacy and our office at least 1 week in advance to refill prescriptions.  Narcotic medications will not be refilled after hours or on weekends.         ATTENTION    As of May 23, 2013, the [employer] ([employer]) has  mandated that ALL PRESCRIPTION PAIN MEDICINE cannot be called into your pharmacy.    This includes:    Oxycodone (Percocet)  Hydrocodone (Vicodin, Norco)  Tramadol (Ultram)  Other    These medications must have prescriptions which are written and signed by your doctor (Dr. Caralee Ates).  This means that you must call ahead and come in to the office to pick up the paper prescription and take it to your pharmacy.    We are sorry for any inconvenience but this is now the law.    Lisabeth Register MD  Board Certified Orthopaedic Surgeon  Knee and Shoulder Surgery Specialist    Contact Information:  Penni Bombard, [phone] Medical Secretary)  [facility] main number: use after hours [phone])                IMPORTANT NARCOTIC INFORMATION: PLEASE READ      DO NOT share your prescription medication with anyone!  Sharing is illegal.  The prescription dose is based on your age, weight, and health problems.  Sharing your narcotic prescription can lead to accidental death of the individual for which the prescription was not prescribed.  You may not know about his/her addiction problem.      Always use the same pharmacy when filling your narcotic prescriptions.      DO NOT mix your narcotic prescription with alcohol.  Mixing the narcotic prescription medication with alcohol causes depressive effects including breathing problems, organ malfunction, and cardiac arrest.      Always keep your narcotic medication in a locked, secured location.  Keep your medication private.  This is to avoid individuals from taking your medication without your knowledge.  This medication is highly sought after and locking your prescriptions will protect you from being a target of crime.      DO NOT stock pile your medication.  This also will protect you from being a target of crime.      Dispose of any unused medications properly.  Do not flush the medications.  Look for appropriate waste collection programs in your community and drug  take back events.      DO NOT drive while taking narcotic pain medication or muscle relaxers.  FOR MORE INFORMATION, CHECK OUT THIS RESOURCE    For your convenience, Dr. Caralee Ates has provided the following link that may be helpful for you if you would like more information on your Orthopaedic condition:    www.Orthoinfo.org                  VITAMIN D3    Dr Caralee Ates recommends that you add an over-the-counter supplement of vitamin D3, (at least 2,000 IU daily).    Vitamin D3 is widely available without a prescription at pharmacies and buying clubs ([employer], [employer]) and on-line at sites such as [employer]. It comes in a variety of formulations (tablets, gelcaps, liquid) and doses (1,000 IU, 2,000 IU, 4,000 IU, 5,000 IU and even higher). The right dose for most people is 2,000 IU per day but higher doses are sometimes needed.  We have not seen any problems with any of the formulations so we have no reason to recommend a specific brand. You can take your vitamin D3 at any time of the day, with or without food, with or without calcium.  Because vitamin D3 is long acting, if you miss your vitamin D3 on one day you can double up the dose on a later day.                No orders of the defined types were placed in this encounter.       Refills/New Prescriptions:  No orders of the defined types were placed in this encounter.     Today's prescription medications will be e-scribed (when appropriate) to the Patient's Preferred Pharmacy:   [facility] - [place], [place] - [address] - P [phone] Carmon Ginsberg [phone]  [address]  [place] [address]  Phone: [phone] Fax: [phone]         Lisabeth Register MD  Board Certified Orthopaedic Surgeon  Knee and Shoulder Surgery Specialist  Electronically signed by Lisabeth Register MD on 05/12/2014 at 3:35 PM     Contact Information:  Penni Bombard, [phone] Medical Secretary)  [facility] main number: use after hours [phone])    This dictation was performed  with a verbal recognition program (DRAGON) and it was checked for errors.  It is possible that there are still dictated errors within this office note.  If so, please bring any errors to my attention for an addendum.  All efforts were made to ensure that this office note is accurate.

## 2014-05-12 NOTE — Patient Instructions (Signed)
I have asked Lambert Keto to schedule a follow-up appointment as needed.    She is specifically instructed to contact the office between now & her scheduled appointment if she has concerns related to her condition. She is welcome to call for an appointment sooner if she has any additional concerns or questions.    Ice ("ICE IS YOUR FRIEND": try using a bag of frozen peas or corn) to injured/painful area for 15 - 20 minutes 3 x day.    General Medication Instructions:  Any prescriptions must be used as directed.  If you have any concerns, questions or require refills, please contact our office.  If you experience any adverse reactions, stop the medication and call our office immediately.    If you designate a preferred pharmacy, appropriate prescriptions will be sent to your preferred pharmacy for pickup to be use as directed.    Patient Driving Instructions:  No driving if you are taking narcotic pain medications or muscle relaxers.    PATIENT REMINDER:   Carry a list of your medications and allergies with you at all times.  Call your pharmacy and our office at least 1 week in advance to refill prescriptions.  Narcotic medications will not be refilled after hours or on weekends.         ATTENTION    As of May 23, 2013, the [employer] ([employer]) has mandated that ALL PRESCRIPTION PAIN MEDICINE cannot be called into your pharmacy.    This includes:    Oxycodone (Percocet)  Hydrocodone (Vicodin, Norco)  Tramadol (Ultram)  Other    These medications must have prescriptions which are written and signed by your doctor (Dr. Caralee Ates).  This means that you must call ahead and come in to the office to pick up the paper prescription and take it to your pharmacy.    We are sorry for any inconvenience but this is now the law.    Lisabeth Register MD  Board Certified Orthopaedic Surgeon  Knee and Shoulder Surgery Specialist    Contact Information:  Penni Bombard, [phone] Medical Secretary)  [facility]  main number: use after hours [phone])                IMPORTANT NARCOTIC INFORMATION: PLEASE READ      DO NOT share your prescription medication with anyone!  Sharing is illegal.  The prescription dose is based on your age, weight, and health problems.  Sharing your narcotic prescription can lead to accidental death of the individual for which the prescription was not prescribed.  You may not know about his/her addiction problem.      Always use the same pharmacy when filling your narcotic prescriptions.      DO NOT mix your narcotic prescription with alcohol.  Mixing the narcotic prescription medication with alcohol causes depressive effects including breathing problems, organ malfunction, and cardiac arrest.      Always keep your narcotic medication in a locked, secured location.  Keep your medication private.  This is to avoid individuals from taking your medication without your knowledge.  This medication is highly sought after and locking your prescriptions will protect you from being a target of crime.      DO NOT stock pile your medication.  This also will protect you from being a target of crime.      Dispose of any unused medications properly.  Do not flush the medications.  Look for appropriate waste collection programs in your community and  drug take back events.     [x]  DO NOT drive while taking narcotic pain medication or muscle relaxers.              FOR MORE INFORMATION, CHECK OUT THIS RESOURCE    For your convenience, Dr. Rosana Berger has provided the following link that may be helpful for you if you would like more information on your Orthopaedic condition:    www.Orthoinfo.org                  VITAMIN D3    Dr Rosana Berger recommends that you add an over-the-counter supplement of vitamin D3, (at least 2,000 IU daily).    Vitamin D3 is widely available without a prescription at pharmacies and buying clubs ([employer], [employer]) and on-line at sites such as http://www.w[employer]-warren.com/. It comes in a variety of  formulations (tablets, gelcaps, liquid) and doses (1,000 IU, 2,000 IU, 4,000 IU, 5,000 IU and even higher). The right dose for most people is 2,000 IU per day but higher doses are sometimes needed.  We have not seen any problems with any of the formulations so we have no reason to recommend a specific brand. You can take your vitamin D3 at any time of the day, with or without food, with or without calcium.  Because vitamin D3 is long acting, if you miss your vitamin D3 on one day you can double up the dose on a later day.

## 2014-05-20 NOTE — Telephone Encounter (Signed)
Patient notified and has good understanding

## 2014-07-07 ENCOUNTER — Encounter

## 2014-07-07 ENCOUNTER — Inpatient Hospital Stay: Admit: 2014-07-07 | Attending: Family Medicine | Primary: Gerontology

## 2014-07-07 DIAGNOSIS — R928 Other abnormal and inconclusive findings on diagnostic imaging of breast: Secondary | ICD-10-CM

## 2014-07-07 NOTE — Progress Notes (Signed)
Quick Note:    Diagnostic mammogram was negative for malignancy (previous tiny densities in left breast felt to be benign and unchanged from prior)  Can plan for routine screening mammogram in 6 months  Please let Pt know  ______

## 2014-08-26 ENCOUNTER — Encounter: Attending: Rheumatology | Primary: Gerontology

## 2014-08-26 NOTE — Progress Notes (Signed)
This encounter was created in error - please disregard.

## 2014-09-22 DIAGNOSIS — M329 Systemic lupus erythematosus, unspecified: Secondary | ICD-10-CM

## 2014-09-22 NOTE — Progress Notes (Signed)
[facility]  [facility] [place]  Rheumatology Progress Note  Harley Hallmark, MD             [facility] Rheumatology           [facility] Rheumatology                                      [address]                  [address]  [address]                     [place], [place] [address]                [place], [place] [address]      [phone]                [phone]      Fax: [phone]                 Fax: [phone]           ________________________________________________________________________                       Patient Name:  Molly [name] is a 54 y.o. patient     Returns today for followup of systemic lupus and osteoarthritis. Since last seen, she Remains on Plaquenil 200 mg twice daily and compound cream.  She has completely discontinued Relafen and so far has done well supplementing with Tylenol when necessary. Has been doing well on these medications and Continues to notice much improvement in her peripheral joints.  She also remains on Cymbalta  60 mg daily in the morning. She continues to notice   improvement in her left knee symptomhowever her symptoms have increased.  She is questioning if she may require another Synvisc injection.She denies having any mouth sores or ulcers, or any  skin rashes. continues to work.  Overall feeling well and stable on current management.     Past medical/surgical history, medications and allergies are reviewed and updated as appropriate.    Allergies   Allergen Reactions   ??? Sulfa Antibiotics Rash       Past Medical History:        Diagnosis Date   ??? Systemic lupus      see Dr Lehman Prom   ??? History of non-Hodgkin's lymphoma 2006     treated with radiation and chemo   ??? Obesity    ??? Diabetes mellitus (HCC)    ??? Cancer (HCC)    ??? Amenorrhea        Past Surgical History:        Procedure Laterality Date   ??? Leep       in 1990's   ??? Knee surgery       ACL and meniscus repair       Medications :    Prior to  Admission medications    Medication Sig Start Date End Date Taking? Authorizing Provider   acetaminophen (TYLENOL) 325 MG tablet Take 650 mg by mouth every 6 hours as needed for Pain    Historical Provider, MD   hydroxychloroquine (PLAQUENIL) 200 MG tablet Take 1 tablet by mouth 2 times daily 04/22/14   Harley Hallmark, MD   DULoxetine (CYMBALTA) 60 MG capsule Take 1 capsule by  mouth daily 04/22/14   Harley HallmarkSoha Duc Crocket, MD   vitamin D (ERGOCALCIFEROL) 50000 UNITS CAPS capsule Take one capsule once a week for 12  weeks, then take  2000 units over-the-counter vitamin D 3 daily with the largest meal of the day. 03/04/14   Harley HallmarkSoha Raphel Stickles, MD   traMADol (ULTRAM) 50 MG TABS Take 50 mg by mouth every 6 hours as needed Take one tab by mouth every 6 hours for pain as needed. 02/28/14   Lisabeth RegisterMichelle Andrews, MD   diclofenac sodium (VOLTAREN) 1 % GEL Below the waist apply 4 gram 4 times a day 02/07/14   Lisabeth RegisterMichelle Andrews, MD   nabumetone (RELAFEN) 750 MG tablet Take 1 tablet by mouth 2 times daily 01/07/14   Harley HallmarkSoha Carizma Dunsworth, MD         Review of Systems:    GENERAL:Denies having any fatigue or unintentional weight loss  HEENT:  Denies having any  Oral lesions, Or sores;lip dryness  LUNGS:Denies having had any new breathing difficulties  ZO:XWRUEAGI:Denies having any GI upset from medications  MUSCULOSKELETAL: Left knee pain, swelling and tenderness  SKIN: New skin lesions mainly involving bilateral lower extremities  LYMPHADENOPATHY: denies having had any recent infectious illness or lymphadenopathy      Physical Exam:      Wt Readings from Last 3 Encounters:   09/23/14 304 lb 6.4 oz (138.075 kg)   05/12/14 290 lb (131.543 kg)   04/23/14 295 lb 12.8 oz (134.174 kg)     Temp Readings from Last 3 Encounters:   12/02/13 98.1 ??F (36.7 ??C) Oral     BP Readings from Last 3 Encounters:   09/23/14 140/100   05/12/14 131/88   04/23/14 110/84     Pulse Readings from Last 3 Encounters:   09/23/14 84   04/23/14 96   04/22/14 84         GENERAL:Able to ambulate without any  assistance.  HEENT: no evidence of any oral ulcers, lesions or sores  LUNGS:clear to auscultation bilaterally, good air intake  CARDIOVASCULAR:regular rate  MUSCULOSKELETAL:Warmth, swelling, tenderness involving the left knee. Otherwise no evidence of synovitis swelling warmth or tenderness of patient's upper or lower peripheral joints.  Skin:Skin is warm and dry. No Petechiae, telangiectasia, purpura, or rash. Palpation of the skin and subcutaneous tissues reveals no evidence of induration, subcutaneous nodule or tenderness. NotedUpper and lower extremity maculopapular lesions not erythematous  Lymphadenopathy: no evidence of any palpable cervical or supraclavicular lymph nodes    Labs:     Lab Results   Component Value Date    WBC 5.5 04/24/2014    HGB 13.6 04/24/2014    HCT 41.9 04/24/2014    MCV 96.6 04/24/2014    PLT 320 04/24/2014       Chemistry        Component Value Date/Time    NA 137 04/24/2014 0937    K 4.5 04/24/2014 0937    CL 98* 04/24/2014 0937    CO2 25 04/24/2014 0937    BUN 13 04/24/2014 0937    CREATININE 0.6 04/24/2014 0937        Component Value Date/Time    CALCIUM 9.4 04/24/2014 0937    ALKPHOS 87 04/24/2014 0937    AST 19 04/24/2014 0937    ALT 15 04/24/2014 0937    BILITOT 0.5 04/24/2014 0937          Lab Results   Component Value Date    SEDRATE 18 04/22/2014     Lab Results  Component Value Date    ANA POSITIVE 04/22/2014    C3 141.9 04/22/2014    C3 172.3 03/04/2014    C3 153.4 11/29/2013    C4 25.3 04/22/2014    C4 30.1 03/04/2014    C4 28.3 11/29/2013           Assessment/Plan:      Assessment/Plan:       Assessment/Plan:  [name] was seen today for follow-up.    Diagnoses and associated orders for this visit:    Systemic lupus erythematosus (HCC)-currently stable on current medications.  Doing well.  ANA was 1: 320 which was pattern when last checked.  Double-stranded DNA was negative and complements were normal.    - CBC Auto Differential; Future  - Comprehensive Metabolic Panel;  Future  - C-Reactive Protein; Future  - Sedimentation Rate; Future  - Urinalysis; Future  - ANA; Future  - C3 Complement; Future  - C4 Complement; Future  - Anti-DNA Antibody, Double-Stranded; Future  - hydroxychloroquine (PLAQUENIL) 200 MG tablet; Take 1 tablet by mouth 2 times daily    Primary osteoarthritis of one knee, unspecified laterality  - DULoxetine (CYMBALTA) 60 MG capsule; Take 1 capsule by mouth daily  -     Reading information on gabapentin was discussed and given to patient to consider.      Return in about 4 months (around 01/22/2015).      The risks and benefits of my recommendations, as well as other treatment options, benefits and side effects were discussed with the patient today. Questions were answered.    Letitia Libra  [facility] [place] [facility]  [facility] and Rheumatology  [address]  [place], [place] [address]  Phone:[phone]  Fax:[phone]    NOTE: This report is transcribed by using voice recognition software dragon. Every effort is made to ensure accuracy; however, inadvertent computerized transcription errors may be present.                       Harley Hallmark, MD, 09/22/2014 7:45 PM

## 2014-09-22 NOTE — Patient Instructions (Addendum)
1.Continue with current medications as indicated      2. Check laboratory studies today to evaluate for any evidence of drug toxicity or side effects      3. Return visit for followup in 4 months or sooner if needed    Lab Results   Component Value Date    WBC 5.5 04/24/2014    HGB 13.6 04/24/2014    HCT 41.9 04/24/2014    MCV 96.6 04/24/2014    PLT 320 04/24/2014       Chemistry        Component Value Date/Time    NA 137 04/24/2014 0937    K 4.5 04/24/2014 0937    CL 98* 04/24/2014 0937    CO2 25 04/24/2014 0937    BUN 13 04/24/2014 0937    CREATININE 0.6 04/24/2014 0937        Component Value Date/Time    CALCIUM 9.4 04/24/2014 0937    ALKPHOS 87 04/24/2014 0937    AST 19 04/24/2014 0937    ALT 15 04/24/2014 0937    BILITOT 0.5 04/24/2014 0937          Lab Results   Component Value Date    SEDRATE 18 04/22/2014     Lab Results   Component Value Date    CRP 4.2 04/22/2014     4.Consider neurontin to help with your kne symptoms    5.Please restart exercise and consider dietary changes    .

## 2014-09-23 ENCOUNTER — Encounter

## 2014-09-23 ENCOUNTER — Ambulatory Visit
Admit: 2014-09-23 | Discharge: 2014-09-23 | Payer: BLUE CROSS/BLUE SHIELD | Attending: Rheumatology | Primary: Gerontology

## 2014-09-23 DIAGNOSIS — M329 Systemic lupus erythematosus, unspecified: Secondary | ICD-10-CM

## 2014-09-23 MED ORDER — DULOXETINE HCL 60 MG PO CPEP
60 MG | ORAL_CAPSULE | Freq: Every day | ORAL | Status: DC
Start: 2014-09-23 — End: 2015-09-29

## 2014-09-23 MED ORDER — HYDROXYCHLOROQUINE SULFATE 200 MG PO TABS
200 MG | ORAL_TABLET | Freq: Two times a day (BID) | ORAL | Status: DC
Start: 2014-09-23 — End: 2015-09-29

## 2014-09-24 LAB — URINALYSIS
Bilirubin Urine: NEGATIVE
Blood, Urine: NEGATIVE
Glucose, Ur: NEGATIVE mg/dL
Ketones, Urine: NEGATIVE mg/dL
Leukocyte Esterase, Urine: NEGATIVE
Nitrite, Urine: NEGATIVE
Protein, UA: NEGATIVE mg/dL
Specific Gravity, UA: 1.011 (ref 1.005–1.030)
Urobilinogen, Urine: 0.2 E.U./dL (ref <2.0)
pH, UA: 6.5 (ref 5.0–8.0)

## 2014-09-24 LAB — CBC WITH AUTO DIFFERENTIAL
Basophils %: 0.6 %
Basophils Absolute: 0 10*3/uL (ref 0.0–0.2)
Eosinophils %: 3.7 %
Eosinophils Absolute: 0.2 10*3/uL (ref 0.0–0.6)
Hematocrit: 42.1 % (ref 36.0–48.0)
Hemoglobin: 13.9 g/dL (ref 12.0–16.0)
Lymphocytes %: 35.1 %
Lymphocytes Absolute: 2 10*3/uL (ref 1.0–5.1)
MCH: 31.8 pg (ref 26.0–34.0)
MCHC: 33.1 g/dL (ref 31.0–36.0)
MCV: 96.1 fL (ref 80.0–100.0)
MPV: 7.5 fL (ref 5.0–10.5)
Monocytes %: 14.7 %
Monocytes Absolute: 0.8 10*3/uL (ref 0.0–1.3)
Neutrophils %: 45.9 %
Neutrophils Absolute: 2.6 10*3/uL (ref 1.7–7.7)
Platelets: 328 10*3/uL (ref 135–450)
RBC: 4.38 M/uL (ref 4.00–5.20)
RDW: 12.5 % (ref 12.4–15.4)
WBC: 5.7 10*3/uL (ref 4.0–11.0)

## 2014-09-24 LAB — COMPREHENSIVE METABOLIC PANEL
ALT: 16 U/L (ref 10–40)
AST: 20 U/L (ref 15–37)
Albumin/Globulin Ratio: 1.3 (ref 1.1–2.2)
Albumin: 4.5 g/dL (ref 3.4–5.0)
Alkaline Phosphatase: 91 U/L (ref 40–129)
Anion Gap: 14 (ref 3–16)
BUN: 10 mg/dL (ref 7–20)
CO2: 26 mmol/L (ref 21–32)
Calcium: 9.5 mg/dL (ref 8.3–10.6)
Chloride: 96 mmol/L — ABNORMAL LOW (ref 99–110)
Creatinine: 0.6 mg/dL (ref 0.6–1.1)
GFR African American: >60 (ref >60)
GFR Non-African American: >60 (ref >60)
Globulin: 3.4 g/dL
Glucose: 99 mg/dL (ref 70–99)
Potassium: 4.2 mmol/L (ref 3.5–5.1)
Sodium: 136 mmol/L (ref 136–145)
Total Bilirubin: 0.4 mg/dL (ref 0.0–1.0)
Total Protein: 7.9 g/dL (ref 6.4–8.2)

## 2014-09-24 LAB — ANA
ANA Titer: 1:320 {titer}
ANA: POSITIVE — AB

## 2014-09-24 LAB — C-REACTIVE PROTEIN: CRP: 4.5 mg/L (ref 0.0–5.1)

## 2014-09-24 LAB — C4 COMPLEMENT: C4 Complement: 23.6 mg/dL (ref 10.0–40.0)

## 2014-09-24 LAB — C3 COMPLEMENT: C3 Complement: 131 mg/dL (ref 90.0–180.0)

## 2014-09-24 LAB — SEDIMENTATION RATE: Sed Rate: 16 mm/Hr (ref 0–30)

## 2014-09-25 LAB — ANTI-DNA ANTIBODY, DOUBLE-STRANDED: Double Stranded Dna Ab, Igg: DETECTED — AB

## 2014-09-26 ENCOUNTER — Other Ambulatory Visit: Payer: Self-pay | Admitting: Internal Medicine

## 2014-09-27 LAB — ANTI-DNA AB, DOUBLE-STRANDED IGG TITER: dsDNA Ab Titer: 1:80 {titer} — ABNORMAL HIGH

## 2015-01-26 NOTE — Patient Instructions (Addendum)
1.Continue with current medications as indicated      2. Check laboratory studies today to evaluate for any evidence of drug toxicity or side effects      3. Return visit for followup in 4-5 months or sooner if needed    Lab Results   Component Value Date    WBC 5.7 09/23/2014    HGB 13.9 09/23/2014    HCT 42.1 09/23/2014    MCV 96.1 09/23/2014    PLT 328 09/23/2014       Chemistry        Component Value Date/Time    NA 136 09/23/2014 1224    K 4.2 09/23/2014 1224    CL 96* 09/23/2014 1224    CO2 26 09/23/2014 1224    BUN 10 09/23/2014 1224    CREATININE 0.6 09/23/2014 1224        Component Value Date/Time    CALCIUM 9.5 09/23/2014 1224    ALKPHOS 91 09/23/2014 1224    AST 20 09/23/2014 1224    ALT 16 09/23/2014 1224    BILITOT 0.4 09/23/2014 1224          Lab Results   Component Value Date    SEDRATE 16 09/23/2014     Lab Results   Component Value Date    CRP 4.5 09/23/2014     4. Please keep appt with dermatology    5.Please call the office should you need referral to see ortho  .

## 2015-01-26 NOTE — Progress Notes (Signed)
[facility]  [facility] [place]  Rheumatology Progress Note  Harley Hallmark, MD             [name]           [facility] Rheumat[facility]                                      [address]                  [address]  [address]                     [place], [place] [address]                [place], [place] [address]      [phone]                [phone]      Fax: [phone]                 Fax: [phone]           ________________________________________________________________________                       Patient Name:  Molly [name] is a 54 y.o. patient     Returns today for followup of systemic lupus and osteoarthritis. Since last seen, she Remains on Plaquenil 200 mg twice daily and compound cream.  Has been doing well on these medications .  She also remains on Cymbalta  60 mg daily in the morning..She denies having any mouth sores or ulcers, or any  skin rashes. continues to work.  Overall feeling well and stable on current management.  Continues to have good and bad days but more good and bad.  Symptoms are mainly associated with her left knee symptoms.  Continues to put off surgery.  Continues to work.    Past medical/surgical history, medications and allergies are reviewed and updated as appropriate.    Allergies   Allergen Reactions   ??? Sulfa Antibiotics Rash       Past Medical History:        Diagnosis Date   ??? Systemic lupus      see Dr Lehman Prom   ??? History of non-Hodgkin's lymphoma 2006     treated with radiation and chemo   ??? Obesity    ??? Diabetes mellitus (HCC)    ??? Cancer (HCC)    ??? Amenorrhea        Past Surgical History:        Procedure Laterality Date   ??? Leep       in 1990's   ??? Knee surgery       ACL and meniscus repair       Medications :    Prior to Admission medications    Medication Sig Start Date End Date Taking? Authorizing Provider   vitamin D (CHOLECALCIFEROL) 1000 UNIT TABS tablet Take 1,000 Units by mouth  daily Twice a day    Historical Provider, MD   acetaminophen (TYLENOL) 650 MG CR tablet Take 650 mg by mouth every 8 hours as needed for Pain    Historical Provider, MD   hydroxychloroquine (PLAQUENIL) 200 MG tablet Take 1 tablet by mouth 2 times daily 09/23/14   Harley Hallmark, MD   DULoxetine (CYMBALTA) 60 MG  capsule Take 1 capsule by mouth daily 09/23/14   Harley HallmarkSoha Elianie Hubers, MD   traMADol (ULTRAM) 50 MG TABS Take 50 mg by mouth every 6 hours as needed Take one tab by mouth every 6 hours for pain as needed. 02/28/14   Lisabeth RegisterMichelle Andrews, MD   diclofenac sodium (VOLTAREN) 1 % GEL Below the waist apply 4 gram 4 times a day 02/07/14   Lisabeth RegisterMichelle Andrews, MD         Review of systems:       GENERAL:Denies having any fatigue or unintentional weight loss  HEENT:  Denies having any  Oral lesions, Or sores;lip dryness  LUNGS:Denies having had any new breathing difficulties  ZO:XWRUEAGI:Denies having any GI upset from medications  MUSCULOSKELETAL: Left knee pain, swelling and tenderness-currently stable but ongoing  SKIN: New skin lesions mainly involving bilateral lower extremities  LYMPHADENOPATHY: denies having had any recent infectious illness or lymphadenopathy      Physical exam:       Wt Readings from Last 3 Encounters:   01/27/15 302 lb (136.986 kg)   09/23/14 304 lb 6.4 oz (138.075 kg)   05/12/14 290 lb (131.543 kg)     Temp Readings from Last 3 Encounters:   12/02/13 98.1 ??F (36.7 ??C) Oral     BP Readings from Last 3 Encounters:   01/27/15 100/88   09/23/14 140/100   05/12/14 131/88     Pulse Readings from Last 3 Encounters:   01/27/15 80   09/23/14 84   04/23/14 96           GENERAL:Able to ambulate without any assistance.  HEENT: no evidence of any oral ulcers, lesions or sores  LUNGS:clear to auscultation bilaterally, good air intake  CARDIOVASCULAR:regular rate  MUSCULOSKELETAL:Warmth, swelling, tenderness involving the left knee. Otherwise no evidence of synovitis swelling warmth or tenderness of patient's upper or lower peripheral  joints.  Skin:Skin is warm and dry. No Petechiae, telangiectasia, purpura, or rash. Palpation of the skin and subcutaneous tissues reveals no evidence of induration, subcutaneous nodule or tenderness. NotedUpper and lower extremity maculopapular lesions not erythematous  Lymphadenopathy: no evidence of any palpable cervical or supraclavicular lymph nodes    Labs:       Lab Results   Component Value Date    WBC 5.7 09/23/2014    HGB 13.9 09/23/2014    HCT 42.1 09/23/2014    MCV 96.1 09/23/2014    PLT 328 09/23/2014       Chemistry        Component Value Date/Time    NA 136 09/23/2014 1224    K 4.2 09/23/2014 1224    CL 96* 09/23/2014 1224    CO2 26 09/23/2014 1224    BUN 10 09/23/2014 1224    CREATININE 0.6 09/23/2014 1224        Component Value Date/Time    CALCIUM 9.5 09/23/2014 1224    ALKPHOS 91 09/23/2014 1224    AST 20 09/23/2014 1224    ALT 16 09/23/2014 1224    BILITOT 0.4 09/23/2014 1224          Lab Results   Component Value Date    SEDRATE 16 09/23/2014     Lab Results   Component Value Date    ANA POSITIVE 09/23/2014    C3 131.0 09/23/2014    C3 141.9 04/22/2014    C3 172.3 03/04/2014    C4 23.6 09/23/2014    C4 25.3 04/22/2014    C4 30.1 03/04/2014  Assessment/Plan:      Assessment/Plan:       Assessment/Plan:  Molly [name] was seen today for follow-up.    Diagnoses and associated orders for this visit:    Systemic lupus erythematosus (HCC)-currently stable on current medications.   ANA was 1: 320 which was pattern when last checked.  Double-stranded DNA was negative and complements were normal.  Doing well    - CBC Auto Differential; Future  - Comprehensive Metabolic Panel; Future  - C-Reactive Protein; Future  - Sedimentation Rate; Future  - Urinalysis; Future  - ANA; Future  - C3 Complement; Future  - C4 Complement; Future  - Anti-DNA Antibody, Double-Stranded; Future  - hydroxychloroquine (PLAQUENIL) 200 MG tablet; Take 1 tablet by mouth 2 times daily    Primary osteoarthritis of one knee,  unspecified laterality.  Currently stable  - DULoxetine (CYMBALTA) 60 MG capsule; Take 1 capsule by mouth daily        Return visit for follow-up in 4-5 months or sooner if needed    The risks and benefits of my recommendations, as well as other treatment options, benefits and side effects were discussed with the patient today. Questions were answered.    Letitia Libra  [facility]  [facility] and Rheumatology  [address]  [place], [place] [address]  Phone:[phone]  Fax:[phone]    NOTE: This report is transcribed by using voice recognition software dragon. Every effort is made to ensure accuracy; however, inadvertent computerized transcription errors may be present.           Harley Hallmark, MD, 01/26/2015 9:20 PM

## 2015-01-27 ENCOUNTER — Encounter

## 2015-01-27 ENCOUNTER — Ambulatory Visit
Admit: 2015-01-27 | Discharge: 2015-01-27 | Payer: BLUE CROSS/BLUE SHIELD | Attending: Rheumatology | Primary: Gerontology

## 2015-01-27 DIAGNOSIS — M329 Systemic lupus erythematosus, unspecified: Secondary | ICD-10-CM

## 2015-01-27 MED ORDER — DICLOFENAC SODIUM 1 % TD GEL
1 % | TRANSDERMAL | Status: DC
Start: 2015-01-27 — End: 2015-09-29

## 2015-01-28 LAB — COMPREHENSIVE METABOLIC PANEL
ALT: 13 U/L (ref 10–40)
AST: 20 U/L (ref 15–37)
Albumin/Globulin Ratio: 1.4 (ref 1.1–2.2)
Albumin: 4.5 g/dL (ref 3.4–5.0)
Alkaline Phosphatase: 88 U/L (ref 40–129)
Anion Gap: 15 (ref 3–16)
BUN: 11 mg/dL (ref 7–20)
CO2: 26 mmol/L (ref 21–32)
Calcium: 9.2 mg/dL (ref 8.3–10.6)
Chloride: 97 mmol/L — ABNORMAL LOW (ref 99–110)
Creatinine: 0.6 mg/dL (ref 0.6–1.1)
GFR African American: >60 (ref >60)
GFR Non-African American: >60 (ref >60)
Globulin: 3.2 g/dL
Glucose: 96 mg/dL (ref 70–99)
Potassium: 4.4 mmol/L (ref 3.5–5.1)
Sodium: 138 mmol/L (ref 136–145)
Total Bilirubin: 0.3 mg/dL (ref 0.0–1.0)
Total Protein: 7.7 g/dL (ref 6.4–8.2)

## 2015-01-28 LAB — CBC WITH AUTO DIFFERENTIAL
Basophils %: 0.5 %
Basophils Absolute: 0 10*3/uL (ref 0.0–0.2)
Eosinophils %: 4.7 %
Eosinophils Absolute: 0.3 10*3/uL (ref 0.0–0.6)
Hematocrit: 40.2 % (ref 36.0–48.0)
Hemoglobin: 13.5 g/dL (ref 12.0–16.0)
Lymphocytes %: 27.1 %
Lymphocytes Absolute: 1.5 10*3/uL (ref 1.0–5.1)
MCH: 32 pg (ref 26.0–34.0)
MCHC: 33.4 g/dL (ref 31.0–36.0)
MCV: 95.6 fL (ref 80.0–100.0)
MPV: 7.3 fL (ref 5.0–10.5)
Monocytes %: 13.8 %
Monocytes Absolute: 0.8 10*3/uL (ref 0.0–1.3)
Neutrophils %: 53.9 %
Neutrophils Absolute: 3 10*3/uL (ref 1.7–7.7)
Platelets: 303 10*3/uL (ref 135–450)
RBC: 4.21 M/uL (ref 4.00–5.20)
RDW: 12.9 % (ref 12.4–15.4)
WBC: 5.5 10*3/uL (ref 4.0–11.0)

## 2015-01-28 LAB — ANA
ANA Titer: 1:320 {titer}
ANA: POSITIVE — AB

## 2015-01-28 LAB — C3 COMPLEMENT: C3 Complement: 135 mg/dL (ref 90.0–180.0)

## 2015-01-28 LAB — URINALYSIS
Bilirubin Urine: NEGATIVE
Blood, Urine: NEGATIVE
Glucose, Ur: NEGATIVE mg/dL
Ketones, Urine: NEGATIVE mg/dL
Leukocyte Esterase, Urine: NEGATIVE
Nitrite, Urine: NEGATIVE
Protein, UA: NEGATIVE mg/dL
Specific Gravity, UA: 1.01 (ref 1.005–1.030)
Urobilinogen, Urine: 0.2 E.U./dL (ref <2.0)
pH, UA: 7 (ref 5.0–8.0)

## 2015-01-28 LAB — SEDIMENTATION RATE: Sed Rate: 19 mm/Hr (ref 0–30)

## 2015-01-28 LAB — C-REACTIVE PROTEIN: CRP: 6.7 mg/L — ABNORMAL HIGH (ref 0.0–5.1)

## 2015-01-28 LAB — C4 COMPLEMENT: C4 Complement: 25.1 mg/dL (ref 10.0–40.0)

## 2015-01-29 LAB — ANTI-DNA ANTIBODY, DOUBLE-STRANDED: Double Stranded Dna Ab, Igg: DETECTED — AB

## 2015-01-29 NOTE — Telephone Encounter (Signed)
Pt calling to get approval for synvisc injections. Please call to advise.

## 2015-01-30 NOTE — Telephone Encounter (Signed)
LMOM. Patient needs to make an appointment before we order Synvisc. She has not been seen since Sept. Of last year and needs updated X-rays.

## 2015-01-31 LAB — ANTI-DNA AB, DOUBLE-STRANDED IGG TITER: dsDNA Ab Titer: 1:80 {titer} — ABNORMAL HIGH

## 2015-02-06 ENCOUNTER — Encounter (HOSPITAL_BASED_OUTPATIENT_CLINIC_OR_DEPARTMENT_OTHER): Payer: Self-pay | Admitting: *Deleted

## 2015-02-06 ENCOUNTER — Emergency Department (HOSPITAL_BASED_OUTPATIENT_CLINIC_OR_DEPARTMENT_OTHER): Payer: Federal, State, Local not specified - PPO

## 2015-02-06 ENCOUNTER — Emergency Department (HOSPITAL_BASED_OUTPATIENT_CLINIC_OR_DEPARTMENT_OTHER)
Admission: EM | Admit: 2015-02-06 | Discharge: 2015-02-06 | Disposition: A | Discharge status: Home / Self Care | Payer: Federal, State, Local not specified - PPO | Attending: Emergency Medicine | Admitting: Emergency Medicine

## 2015-02-06 DIAGNOSIS — Z792 Long term (current) use of antibiotics: Secondary | ICD-10-CM | POA: Diagnosis not present

## 2015-02-06 DIAGNOSIS — Y9289 Other specified places as the place of occurrence of the external cause: Secondary | ICD-10-CM | POA: Insufficient documentation

## 2015-02-06 DIAGNOSIS — Z8739 Personal history of other diseases of the musculoskeletal system and connective tissue: Secondary | ICD-10-CM | POA: Diagnosis not present

## 2015-02-06 DIAGNOSIS — Z7951 Long term (current) use of inhaled steroids: Secondary | ICD-10-CM | POA: Diagnosis not present

## 2015-02-06 DIAGNOSIS — Y998 Other external cause status: Secondary | ICD-10-CM | POA: Diagnosis not present

## 2015-02-06 DIAGNOSIS — Z9882 Breast implant status: Secondary | ICD-10-CM | POA: Insufficient documentation

## 2015-02-06 DIAGNOSIS — S61219A Laceration without foreign body of unspecified finger without damage to nail, initial encounter: Secondary | ICD-10-CM

## 2015-02-06 DIAGNOSIS — Z23 Encounter for immunization: Secondary | ICD-10-CM | POA: Insufficient documentation

## 2015-02-06 DIAGNOSIS — I10 Essential (primary) hypertension: Secondary | ICD-10-CM | POA: Diagnosis not present

## 2015-02-06 DIAGNOSIS — S61011A Laceration without foreign body of right thumb without damage to nail, initial encounter: Secondary | ICD-10-CM | POA: Insufficient documentation

## 2015-02-06 DIAGNOSIS — S61210A Laceration without foreign body of right index finger without damage to nail, initial encounter: Secondary | ICD-10-CM | POA: Insufficient documentation

## 2015-02-06 DIAGNOSIS — Z9884 Bariatric surgery status: Secondary | ICD-10-CM | POA: Insufficient documentation

## 2015-02-06 DIAGNOSIS — Z8639 Personal history of other endocrine, nutritional and metabolic disease: Secondary | ICD-10-CM | POA: Insufficient documentation

## 2015-02-06 DIAGNOSIS — W293XXA Contact with powered garden and outdoor hand tools and machinery, initial encounter: Secondary | ICD-10-CM | POA: Diagnosis not present

## 2015-02-06 DIAGNOSIS — F329 Major depressive disorder, single episode, unspecified: Secondary | ICD-10-CM | POA: Diagnosis not present

## 2015-02-06 DIAGNOSIS — Z79899 Other long term (current) drug therapy: Secondary | ICD-10-CM | POA: Diagnosis not present

## 2015-02-06 DIAGNOSIS — Y9389 Activity, other specified: Secondary | ICD-10-CM | POA: Diagnosis not present

## 2015-02-06 MED ORDER — LIDOCAINE HCL 2 % IJ SOLN
10.0000 mL | Freq: Once | INTRAMUSCULAR | Status: AC
  Administered 2015-02-06: 20 mL via INTRADERMAL
  Filled 2015-02-06: qty 20

## 2015-02-06 MED ORDER — TETANUS-DIPHTH-ACELL PERTUSSIS 5-2.5-18.5 LF-MCG/0.5 IM SUSP
0.5000 mL | Freq: Once | INTRAMUSCULAR | Status: AC
  Administered 2015-02-06: 0.5 mL via INTRAMUSCULAR
  Filled 2015-02-06: qty 0.5

## 2015-02-06 NOTE — Discharge Instructions (Signed)
Please read and follow all provided instructions.  Your diagnoses today include:  1. Finger laceration, initial encounter     Tests performed today include:  X-ray of the affected area that did not show any foreign bodies or broken bones  Vital signs. See below for your results today.   Medications prescribed:   None  Take any prescribed medications only as directed.   Home care instructions:  Follow any educational materials and wound care instructions contained in this packet.   Keep affected area above the level of your heart when possible to minimize swelling. Wash area gently twice a day with warm soapy water. Do not apply alcohol or hydrogen peroxide. Cover the area if it draining or weeping.   Follow-up instructions: Suture Removal: Return to the Emergency Department or see your primary care care doctor in 7 days for a recheck of your wound and removal of your sutures or staples.    Return instructions:  Return to the Emergency Department if you have:  Fever  Worsening pain  Worsening swelling of the wound  Pus draining from the wound  Redness of the skin that moves away from the wound, especially if it streaks away from the affected area   Any other emergent concerns  Your vital signs today were: BP 140/73 mmHg   Pulse 69   Temp(Src) 98.2 F (36.8 C) (Oral)   Resp 20   Ht 5\' 6"  (1.676 m)   Wt 242 lb (109.77 kg)   BMI 39.08 kg/m2   SpO2 100% If your blood pressure (BP) was elevated above 135/85 this visit, please have this repeated by your doctor within one month. --------------

## 2015-02-06 NOTE — ED Provider Notes (Signed)
CSN: [id number]     Arrival date & time 02/06/15  1900 History   First MD Initiated Contact with Patient 02/06/15 1902     Chief Complaint  Patient presents with  . Laceration     (Consider location/radiation/quality/duration/timing/severity/associated sxs/prior Treatment) HPI Comments: Patient presents with complaint of laceration to her right thumb and index finger occurring just prior to arrival. Patient was using electric grass trimmers and cut her fingers on the blade. No treatment other than pressure applied prior to arrival. Patient denies other injury. Last tetanus was 5 years ago. Onset of symptoms acute. Course is constant. Nothing makes symptoms better or worse. She denies numbness or tingling in her fingers.  Patient is a 54 y.o. female presenting with skin laceration. The history is provided by the patient.  Laceration   Past Medical History  Diagnosis Date  . Morbid obesity 03/17/2007  . DEPRESSION 03/17/2007  . HYPERTENSION 03/17/2007  . DEGENERATIVE JOINT DISEASE, RIGHT KNEE 03/17/2007  . RASH-NONVESICULAR 11/17/2009  . BREAST IMPLANTS, BILATERAL, HX OF 11/17/2009  . Hyperlipidemia 12/19/2010  . Family history of colon cancer 02/18/2014    And father with polyps   Past Surgical History  Procedure Laterality Date  . Gastric bypass  2007  . Breast enhancement surgery  1981  . Jaw surgury  1980   Family History  Problem Relation Age of Onset  . Diabetes Mother   . Diabetes Father   . Heart disease Father   . Hyperlipidemia Father   . Heart attack Father     s/p MI x 3  . Cancer Other     colon cancer  . Transient ischemic attack Other   . Heart disease Other     CHF   History  Substance Use Topics  . Smoking status: Never Smoker   . Smokeless tobacco: Not on file  . Alcohol Use: Yes     Comment: social   OB History    No data available     Review of Systems  Constitutional: Negative for activity change.  Musculoskeletal: Positive for myalgias.  Negative for back pain, joint swelling and arthralgias.  Skin: Positive for wound.  Neurological: Negative for weakness and numbness.      Allergies  Codeine  Home Medications   Prior to Admission medications   Medication Sig Start Date End Date Taking? Authorizing Provider  B Complex Vitamins (B COMPLEX 100 PO) Take by mouth daily.      Historical Provider, MD  Calcium-Magnesium 250-125 MG TABS Take by mouth daily.      Historical Provider, MD  cetirizine (ZYRTEC) 10 MG tablet TAKE 1 TABLET BY MOUTH EVERY DAY AS NEEDED 09/19/13   Corwin Levins, MD  clonazePAM (KLONOPIN) 0.5 MG tablet Take 0.5 mg by mouth 2 (two) times daily as needed.    Historical Provider, MD  fluticasone (FLONASE) 50 MCG/ACT nasal spray PLACE 2 SPRAYS INTO BOTH NOSTRILS DAILY 09/26/14   Corwin Levins, MD  Levonorgestrel (MIRENA IU) by Intrauterine route.      Historical Provider, MD  levothyroxine (SYNTHROID, LEVOTHROID) 112 MCG tablet TAKE 1 TABLET BY MOUTH EVERY DAY    Corwin Levins, MD  metroNIDAZOLE (METROGEL) 0.75 % gel Apply topically 2 (two) times daily.      Historical Provider, MD  Multiple Vitamin (MULTIVITAMIN) capsule Take 1 capsule by mouth daily.      Historical Provider, MD  nefazodone (SERZONE) 200 MG tablet 1 tablet in the morning and 2 tablets in the  evening     Historical Provider, MD  olmesartan (BENICAR) 20 MG tablet Take 1 tablet (20 mg total) by mouth daily. 02/18/14   Corwin Levins, MD  sertraline (ZOLOFT) 50 MG tablet Take 50 mg by mouth daily.      Historical Provider, MD  temazepam (RESTORIL) 30 MG capsule Take 30 mg by mouth as needed.      Historical Provider, MD   BP 140/73 mmHg  Pulse 69  Temp(Src) 98.2 F (36.8 C) (Oral)  Resp 20  Ht  (1.676 m)  Wt 242 lb (109.77 kg)  BMI 39.08 kg/m2  SpO2 100% Physical Exam  Constitutional: She appears well-developed and well-nourished.  HENT:  Head: Normocephalic and atraumatic.  Eyes: Pupils are equal, round, and reactive to light.  Neck:  Normal range of motion. Neck supple.  Cardiovascular: Exam reveals no decreased pulses.   Musculoskeletal: She exhibits tenderness. She exhibits no edema.  Full range of motion of all fingers of right hand including thumb and index finger. Normal distal sensation. Normal capillary refill.  Neurological: She is alert. No sensory deficit.  Motor, sensation, and vascular distal to the injury is fully intact.   Skin: Skin is warm and dry.  1 cm laceration on the ulnar aspect of the right thumb distal to the IP joint. Patient does have a crack in her acrylic nail however I do not think that the blade extended this far into her finger. Clean, hemostatic. No foreign bodies seen.  1 cm laceration to the pad of the right index finger distal to the DIP joint. Clean, hemostatic. No foreign bodies seen.  Psychiatric: She has a normal mood and affect.  Nursing note and vitals reviewed.   ED Course  Procedures (including critical care time) Labs Review Labs Reviewed - No data to display  Imaging Review Dg Hand Complete Right  02/06/2015   CLINICAL DATA:  54 year old female with laceration of the first and second digits from electronic grafts clip per. Initial encounter.  EXAM: RIGHT HAND - COMPLETE 3+ VIEW  COMPARISON:  None.  FINDINGS: Soft tissue injury along the radial aspect of the second finger DIP. No radiopaque foreign body identified. No subcutaneous gas. Underlying osseous structures appear intact.  No other discrete soft tissue injury identified. Distal radius and ulna appear intact. Ulna minus variance. Carpal bone alignment within normal limits. Metacarpals and phalanges intact. No acute osseous abnormality identified.  IMPRESSION: No radiopaque foreign body or acute osseous abnormality identified in the right hand.   Electronically Signed   By: Odessa Fleming M.D.   On: 02/06/2015 19:49     EKG Interpretation None       Patient seen and examined. Work-up initiated. Medications ordered.   Vital  signs reviewed and are as follows: BP 150/94 mmHg  Pulse 70  Temp(Src) 98.9 F (37.2 C) (Oral)  Resp 20  Ht  (1.676 m)  Wt 242 lb (109.77 kg)  BMI 39.08 kg/m2  SpO2 98%  X-ray does not show any bony involvement or radiopaque foreign bodies. Patient agrees to proceed with repair of wound.  LACERATION REPAIR Performed by: Carolee Rota Authorized by: Carolee Rota Consent: Verbal consent obtained. Risks and benefits: risks, benefits and alternatives were discussed Consent given by: patient Patient identity confirmed: provided demographic data Prepped and Draped in normal sterile fashion Wound explored  Laceration Location: R thumb  Laceration Length: 1cm  No Foreign Bodies seen or palpated  Anesthesia: local infiltration  Local anesthetic: lidocaine 2%  without epinephrine  Anesthetic total: 3 ml  Irrigation method: skin scrub with dermal cleaner Amount of cleaning: standard  Skin closure: 5-0 Ethilon  Number of sutures: 1  Technique: simple interrupted  Patient tolerance: Patient tolerated the procedure well with no immediate complications.   LACERATION REPAIR Performed by: Carolee Rota Authorized by: Carolee Rota Consent: Verbal consent obtained. Risks and benefits: risks, benefits and alternatives were discussed Consent given by: patient Patient identity confirmed: provided demographic data Prepped and Draped in normal sterile fashion Wound explored  Laceration Location: R index  Laceration Length: 1cm  No Foreign Bodies seen or palpated  Anesthesia: local infiltration  Local anesthetic: lidocaine 2% with epinephrine  Anesthetic total: 3 ml  Irrigation method: skin scrub with dermal cleanser Amount of cleaning: standard  Skin closure: 5-0 Ethilon  Number of sutures: 3  Technique: simple interrupted  Patient tolerance: Patient tolerated the procedure well with no immediate complications.   9:07 PM Patient counseled on  wound care. Patient counseled on need to return or see PCP/urgent care for suture removal in 7 days. Patient was urged to return to the Emergency Department urgently with worsening pain, swelling, expanding erythema especially if it streaks away from the affected area, fever, or if they have any other concerns. Patient verbalized understanding.    MDM   Final diagnoses:  Finger laceration, initial encounter   Minor finger lacs. No abx indicated. Wounds repaired without complication. Fingers are neurovascularly intact with good motor function.   Renne Crigler, PA-C 02/06/15 2107  Linwood Dibbles, MD 02/07/15 [phone]

## 2015-02-06 NOTE — ED Notes (Signed)
Laceration to her right thumb and 2nd digit. She cut her hand with a pair of electric grass trimmers.

## 2015-02-24 ENCOUNTER — Other Ambulatory Visit: Payer: Self-pay | Admitting: Internal Medicine

## 2015-06-02 ENCOUNTER — Ambulatory Visit
Admit: 2015-06-02 | Discharge: 2015-06-02 | Payer: BLUE CROSS/BLUE SHIELD | Attending: Rheumatology | Primary: Gerontology

## 2015-06-02 DIAGNOSIS — M329 Systemic lupus erythematosus, unspecified: Secondary | ICD-10-CM

## 2015-06-02 MED ORDER — LIDOCAINE 5 % EX PTCH
5 % | MEDICATED_PATCH | Freq: Every day | CUTANEOUS | 0 refills | Status: DC
Start: 2015-06-02 — End: 2016-06-29

## 2015-06-02 MED ORDER — NABUMETONE 500 MG PO TABS
500 MG | ORAL_TABLET | Freq: Two times a day (BID) | ORAL | 4 refills | Status: DC
Start: 2015-06-02 — End: 2016-06-29

## 2015-06-02 NOTE — Progress Notes (Signed)
[facility]  [facility] Progress Note  Molly Hallmark, MD            [x]  [facility]         []   [facility]                                      [address]                  [address]  [address]                     [place], [place] [address]                [place], [place] [address]      [phone]                [phone]      Fax: [phone]                 Fax: [phone]           ________________________________________________________________________                       Patient Name:  Molly [name] is [name] 54 y.o. patient     Returns today for followup of systemic lupus and osteoarthritis. Since last seen, she Remains on Plaquenil 200 mg twice daily and compound cream.  Has been doing well on these medications .  She also remains on Cymbalta  60 mg daily in the morning..She denies having any mouth sores or ulcers, or any  skin rashes. continues to work.  Overall feeling well and stable on current management.  However she continues to have ongoing difficulty involving her left knee.  Has recently received Synvisc injections and they've only helped mildly this time around.  She is also developing hip pain, right ankle and knee pain.  Despite this she hasn't missed any days of work.  In addition she's been working [name] lot more hours and has been more active.  However because of her knee she has not been exercising as she had in the past.  Is not currently using any anti-inflammatory medication.  Instead using Tylenol when necessary pain.  Tolerating her medications well without side effects or difficulties.          l History:        Diagnosis Date   ??? Amenorrhea    ??? Cancer (HCC)    ??? Diabetes mellitus (HCC)    ??? History of non-Hodgkin's lymphoma 2006     treated with radiation and chemo   ??? Obesity    ??? Systemic lupus      see Dr Lehman Prom       Past Surgical History:        Procedure Laterality Date   ??? Leep        in 1990's   ??? Knee surgery       ACL and meniscus repair       Medications :    Prior to Admission medications    Medication Sig Start Date End Date Taking? Authorizing Provider   lidocaine (LIDODERM) 5 % Place 1 patch onto the skin daily 12 hours on, 12 hours off. 06/02/15  Yes Molly Hallmark, MD   nabumetone (RELAFEN) 500 MG tablet  Take 1 tablet by mouth 2 times daily Take with food 06/02/15  Yes Molly Hallmark, MD   diclofenac sodium (VOLTAREN) 1 % GEL Below the waist apply 4 gram 4 times [name] day 01/27/15  Yes Molly Hallmark, MD   vitamin D (CHOLECALCIFEROL) 1000 UNIT TABS tablet Take 1,000 Units by mouth daily Twice [name] day   Yes Historical Provider, MD   acetaminophen (TYLENOL) 650 MG CR tablet Take 650 mg by mouth every 8 hours as needed for Pain   Yes Historical Provider, MD   hydroxychloroquine (PLAQUENIL) 200 MG tablet Take 1 tablet by mouth 2 times daily 09/23/14  Yes Molly Hallmark, MD   DULoxetine (CYMBALTA) 60 MG capsule Take 1 capsule by mouth daily 09/23/14  Yes Molly Hallmark, MD   traMADol (ULTRAM) 50 MG TABS Take 50 mg by mouth every 6 hours as needed Take one tab by mouth every 6 hours for pain as needed. 02/28/14  Yes Lisabeth Register, MD         Review of systems:       GENERAL:Denies having any fatigue or unintentional weight loss  HEENT:  Denies having any  Oral lesions, Or sores;lip dryness  LUNGS:Denies having had any new breathing difficulties  ZO:[id number] having any GI upset from medications  MUSCULOSKELETAL: Left knee pain, swelling and tenderness???  Ongoing; right ankle and knee with increased pain; diffuse myalgias; hip pain  SKIN: New skin lesions mainly involving  left upper extremity  LYMPHADENOPATHY: denies having had any recent infectious illness or lymphadenopathy      Physical exam:       Wt Readings from Last 3 Encounters:   06/02/15 (!) 303 lb 6.4 oz (137.6 kg)   01/27/15 (!) 302 lb (137 kg)   09/23/14 (!) 304 lb 6.4 oz (138.1 kg)     Temp Readings from Last 3 Encounters:   12/02/13 98.1 ??F (36.7 ??C) (Oral)      BP Readings from Last 3 Encounters:   06/02/15 132/74   01/27/15 100/88   09/23/14 (!) 140/100     Pulse Readings from Last 3 Encounters:   06/02/15 80   01/27/15 80   09/23/14 84           GENERAL:Able to ambulate without any assistance.  HEENT: no evidence of any oral ulcers, lesions or sores  LUNGS:clear to auscultation bilaterally, good air intake  CARDIOVASCULAR:regular rate  MUSCULOSKELETAL:Warmth, swelling, tenderness involving the left knee. Otherwise no evidence of synovitis swelling warmth or tenderness of patient's upper or lower peripheral joints.  Skin:Skin is warm and dry. No Petechiae, telangiectasia, purpura, or rash. Palpation of the skin and subcutaneous tissues reveals no evidence of induration, subcutaneous nodule or tenderness. Noted left  Upper  extremity  poor wound healing   Lymphadenopathy: no evidence of any palpable cervical or supraclavicular lymph nodes    Labs:       Lab Results   Component Value Date    WBC 5.5 01/27/2015    HGB 13.5 01/27/2015    HCT 40.2 01/27/2015    MCV 95.6 01/27/2015    PLT 303 01/27/2015       Chemistry        Component Value Date/Time    NA 138 01/27/2015 1218    K 4.4 01/27/2015 1218    CL 97 (L) 01/27/2015 1218    CO2 26 01/27/2015 1218    BUN 11 01/27/2015 1218    CREATININE 0.6 01/27/2015 1218  Component Value Date/Time    CALCIUM 9.2 01/27/2015 1218    ALKPHOS 88 01/27/2015 1218    AST 20 01/27/2015 1218    ALT 13 01/27/2015 1218    BILITOT 0.3 01/27/2015 1218          Lab Results   Component Value Date    SEDRATE 19 01/27/2015     Lab Results   Component Value Date    ANA POSITIVE 01/27/2015    C3 135.0 01/27/2015    C3 131.0 09/23/2014    C3 141.9 04/22/2014    C4 25.1 01/27/2015    C4 23.6 09/23/2014    C4 25.3 04/22/2014           Assessment/Plan:      Assessment/Plan:       Assessment/Plan:  Molly [name] was seen today for follow-up.    Diagnoses and all orders for this visit:    Systemic lupus erythematosus (HCC)???currently stable on Plaquenil 200  mg taken twice [name] day.  Last ANA remains +1:320 homogeneous pattern with [name] positive double-stranded DNA 1:80 and normal complements.  Plan to continue with current management.  Her diffuse myalgias most likely secondary to the osteoarthritis.  -     CBC Auto Differential  -     Comprehensive Metabolic Panel  -     C-Reactive Protein  -     Sedimentation Rate  -     ANA  -     Anti-DNA Antibody, Double-Stranded  -     C3 Complement  -     C4 Complement    Primary osteoarthritis of left knee???has recently received [name] Synvisc injection.  Plan on restarting Relafen to use when necessary and the Lidoderm patches when necessary.  Continue Cymbalta 60 mg daily.    -     lidocaine (LIDODERM) 5 %; Place 1 patch onto the skin daily 12 hours on, 12 hours off.  -     nabumetone (RELAFEN) 500 MG tablet; Take 1 tablet by mouth 2 times daily Take with food       Return in about 4 months (around 10/03/2015).      The risks and benefits of my recommendations, as well as other treatment options, benefits and side effects were discussed with the patient today. Questions were answered.    Letitia Libra  [facility]  [facility]  [address]  [place], [place] [address]  Phone:[phone]  Fax:[phone]    NOTE: This report is transcribed by using voice recognition software dragon. Every effort is made to ensure accuracy; however, inadvertent computerized transcription errors may be present.               Molly Hallmark, MD, 06/02/2015 12:43 PM

## 2015-06-02 NOTE — Patient Instructions (Signed)
1.Continue with current medications as indicated      2. Check laboratory studies today to evaluate for any evidence of drug toxicity or side effects      3. Return visit for followup in 4 months or sooner if needed    Lab Results   Component Value Date    WBC 5.5 01/27/2015    HGB 13.5 01/27/2015    HCT 40.2 01/27/2015    MCV 95.6 01/27/2015    PLT 303 01/27/2015       Chemistry        Component Value Date/Time    NA 138 01/27/2015 1218    K 4.4 01/27/2015 1218    CL 97 (L) 01/27/2015 1218    CO2 26 01/27/2015 1218    BUN 11 01/27/2015 1218    CREATININE 0.6 01/27/2015 1218        Component Value Date/Time    CALCIUM 9.2 01/27/2015 1218    ALKPHOS 88 01/27/2015 1218    AST 20 01/27/2015 1218    ALT 13 01/27/2015 1218    BILITOT 0.3 01/27/2015 1218          Lab Results   Component Value Date    SEDRATE 19 01/27/2015     Lab Results   Component Value Date    CRP 6.7 (H) 01/27/2015     4.  Restart Relafen 1 tablet twice daily with food on an as-needed basis  .

## 2015-06-03 LAB — SEDIMENTATION RATE: Sed Rate: 19 mm/Hr (ref 0–30)

## 2015-06-03 LAB — CBC WITH AUTO DIFFERENTIAL
Basophils %: 0.4 %
Basophils Absolute: 0 10*3/uL (ref 0.0–0.2)
Eosinophils %: 4.8 %
Eosinophils Absolute: 0.2 10*3/uL (ref 0.0–0.6)
Hematocrit: 41.6 % (ref 36.0–48.0)
Hemoglobin: 14 g/dL (ref 12.0–16.0)
Lymphocytes %: 29 %
Lymphocytes Absolute: 1.5 10*3/uL (ref 1.0–5.1)
MCH: 32.3 pg (ref 26.0–34.0)
MCHC: 33.6 g/dL (ref 31.0–36.0)
MCV: 95.9 fL (ref 80.0–100.0)
MPV: 7.5 fL (ref 5.0–10.5)
Monocytes %: 13.2 %
Monocytes Absolute: 0.7 10*3/uL (ref 0.0–1.3)
Neutrophils %: 52.6 %
Neutrophils Absolute: 2.7 10*3/uL (ref 1.7–7.7)
Platelets: 300 10*3/uL (ref 135–450)
RBC: 4.33 M/uL (ref 4.00–5.20)
RDW: 12.9 % (ref 12.4–15.4)
WBC: 5.1 10*3/uL (ref 4.0–11.0)

## 2015-06-03 LAB — C4 COMPLEMENT: C4 Complement: 24 mg/dL (ref 10.0–40.0)

## 2015-06-03 LAB — COMPREHENSIVE METABOLIC PANEL
ALT: 13 U/L (ref 10–40)
AST: 19 U/L (ref 15–37)
Albumin/Globulin Ratio: 1.5 (ref 1.1–2.2)
Albumin: 4.7 g/dL (ref 3.4–5.0)
Alkaline Phosphatase: 98 U/L (ref 40–129)
Anion Gap: 16 (ref 3–16)
BUN: 10 mg/dL (ref 7–20)
CO2: 27 mmol/L (ref 21–32)
Calcium: 9.8 mg/dL (ref 8.3–10.6)
Chloride: 95 mmol/L — ABNORMAL LOW (ref 99–110)
Creatinine: 0.6 mg/dL (ref 0.6–1.1)
GFR African American: >60 (ref >60)
GFR Non-African American: >60 (ref >60)
Globulin: 3.2 g/dL
Glucose: 98 mg/dL (ref 70–99)
Potassium: 4.9 mmol/L (ref 3.5–5.1)
Sodium: 138 mmol/L (ref 136–145)
Total Bilirubin: 0.4 mg/dL (ref 0.0–1.0)
Total Protein: 7.9 g/dL (ref 6.4–8.2)

## 2015-06-03 LAB — ANA
ANA Titer: 1:80 {titer}
ANA: POSITIVE — AB

## 2015-06-03 LAB — C3 COMPLEMENT: C3 Complement: 137.6 mg/dL (ref 90.0–180.0)

## 2015-06-03 LAB — C-REACTIVE PROTEIN: CRP: 5 mg/L (ref 0.0–5.1)

## 2015-06-04 LAB — ANTI-DNA ANTIBODY, DOUBLE-STRANDED: Double Stranded Dna Ab, Igg: DETECTED — AB

## 2015-06-05 ENCOUNTER — Encounter

## 2015-06-05 LAB — ANTI-DNA AB, DOUBLE-STRANDED IGG TITER: dsDNA Ab Titer: 1:40 {titer} — ABNORMAL HIGH

## 2015-09-28 NOTE — Progress Notes (Signed)
[facility]  [facility] [place]  Rheumatology Progress Note  Harley Hallmark, MD             [facility] Rheumat[facility]           [facility]                                      [address]                  [address]  [address]                     [place], [place] [address]                [place], [place] [address]      [phone]                [phone]      Fax: [phone]                 Fax: [phone]           ________________________________________________________________________                       Patient Name:  Molly [name] is a 55 y.o. patient     Returns today for followup of systemic lupus and osteoarthritis. Since last seen, she Remains on Plaquenil 200 mg twice daily and compound cream.  Has been doing well on these medications .  She also remains on Cymbalta  60 mg daily in the morning. She denies having any mouth sores or ulcers, or any  skin rashes. continues to work.  Overall feeling well and stable on current management.  However she continues to have ongoing difficulty involving her left knee.  Has recently received Synvisc injections and they've only helped mildly this time around.  She is also developing hip pain, right ankle and knee pain.  Despite this she hasn't missed any days of work.   Tolerating her medications well without side effects or difficulties.    l History:        Diagnosis Date   ??? Amenorrhea    ??? Cancer (HCC)    ??? Diabetes mellitus (HCC)    ??? History of non-Hodgkin's lymphoma 2006     treated with radiation and chemo   ??? Obesity    ??? Systemic lupus      see Dr Lehman Prom       Past Surgical History:        Procedure Laterality Date   ??? Leep       in 1990's   ??? Knee surgery       ACL and meniscus repair       Medications :    Prior to Admission medications    Medication Sig Start Date End Date Taking? Authorizing Provider   hydroxychloroquine (PLAQUENIL) 200 MG tablet Take 1 tablet by mouth 2 times  daily 09/29/15  Yes Harley Hallmark, MD   DULoxetine (CYMBALTA) 60 MG extended release capsule Take 1 capsule by mouth daily 09/29/15  Yes Harley Hallmark, MD   lidocaine (LIDODERM) 5 % Place 1 patch onto the skin daily 12 hours on, 12 hours off. 06/02/15  Yes Harley Hallmark, MD   vitamin D (CHOLECALCIFEROL) 1000 UNIT TABS tablet Take 1,000 Units by mouth daily Twice a  day   Yes Historical Provider, MD   acetaminophen (TYLENOL) 650 MG CR tablet Take 650 mg by mouth every 8 hours as needed for Pain   Yes Historical Provider, MD   nabumetone (RELAFEN) 500 MG tablet Take 1 tablet by mouth 2 times daily Take with food 06/02/15   Harley Hallmark, MD         Review of systems:       GENERAL:Denies having any fatigue or unintentional weight loss  HEENT:  Denies having any  Oral lesions, Or sores;lip dryness  LUNGS:Denies having had any new breathing difficulties  ZO:XWRUEA having any GI upset from medications  MUSCULOSKELETAL: Left knee pain, swelling and tenderness???  Ongoing; right ankle and knee with increased pain; diffuse myalgias; hip pain  SKIN: New skin lesions mainly involving  left upper extremity  LYMPHADENOPATHY: denies having had any recent infectious illness or lymphadenopathy      Physical exam:       Wt Readings from Last 3 Encounters:   06/02/15 (!) 303 lb 6.4 oz (137.6 kg)   01/27/15 (!) 302 lb (137 kg)   09/23/14 (!) 304 lb 6.4 oz (138.1 kg)     Temp Readings from Last 3 Encounters:   12/02/13 98.1 ??F (36.7 ??C) (Oral)     BP Readings from Last 3 Encounters:   09/29/15 (!) 138/98   06/02/15 132/74   01/27/15 100/88     Pulse Readings from Last 3 Encounters:   09/29/15 88   06/02/15 80   01/27/15 80           GENERAL:Able to ambulate without any assistance.  HEENT: no evidence of any oral ulcers, lesions or sores  LUNGS:clear to auscultation bilaterally, good air intake  CARDIOVASCULAR:regular rate  MUSCULOSKELETAL:Warmth, swelling, tenderness involving the left knee. Otherwise no evidence of synovitis swelling warmth or  tenderness of patient's upper or lower peripheral joints.  Skin:Skin is warm and dry. No Petechiae, telangiectasia, purpura, or rash. Palpation of the skin and subcutaneous tissues reveals no evidence of induration, subcutaneous nodule or tenderness. Noted left  Upper  extremity  poor wound healing   Lymphadenopathy: no evidence of any palpable cervical or supraclavicular lymph nodes    Labs:       Lab Results   Component Value Date    WBC 5.1 06/02/2015    HGB 14.0 06/02/2015    HCT 41.6 06/02/2015    MCV 95.9 06/02/2015    PLT 300 06/02/2015       Chemistry        Component Value Date/Time    NA 138 06/02/2015 1148    K 4.9 06/02/2015 1148    CL 95 (L) 06/02/2015 1148    CO2 27 06/02/2015 1148    BUN 10 06/02/2015 1148    CREATININE 0.6 06/02/2015 1148        Component Value Date/Time    CALCIUM 9.8 06/02/2015 1148    ALKPHOS 98 06/02/2015 1148    AST 19 06/02/2015 1148    ALT 13 06/02/2015 1148    BILITOT 0.4 06/02/2015 1148          Lab Results   Component Value Date    SEDRATE 19 06/02/2015     Lab Results   Component Value Date    ANA POSITIVE 06/02/2015    C3 137.6 06/02/2015    C3 135.0 01/27/2015    C3 131.0 09/23/2014    C4 24.0 06/02/2015    C4 25.1 01/27/2015  C4 23.6 09/23/2014           Assessment/Plan:      Assessment/Plan:       Assessment/Plan:  Ramseur was seen today for follow-up.    Diagnoses and all orders for this visit:    Systemic lupus erythematosus (HCC)???currently stable on Plaquenil 200 mg twice a day and compound cream.  Good prognosis.  Continue.  Recommended that she consider over-the-counter magnesium and possibly  Boswellia to help her with her joint symptoms.  -     CBC Auto Differential  -     Comprehensive Metabolic Panel  -     C-Reactive Protein  -     Sedimentation Rate  -     ANA  -     Anti-DNA Antibody, Double-Stranded  -     C3 Complement  -     C4 Complement  -     hydroxychloroquine (PLAQUENIL) 200 MG tablet; Take 1 tablet by mouth 2 times daily    High risk medication  use  -     CBC Auto Differential  -     Comprehensive Metabolic Panel    Primary osteoarthritis of left knee???currently following up with Dr. Maisie Fus.  Recently received Synvisc injection.  Continue.    Primary osteoarthritis of one knee, unspecified laterality  -     DULoxetine (CYMBALTA) 60 MG extended release capsule; Take 1 capsule by mouth daily      Return in about 4 months (around 01/27/2016).      The risks and benefits of my recommendations, as well as other treatment options, benefits and side effects were discussed with the patient today. Questions were answered.    Letitia Libra  [facility]  Internists of [facility] and Rheumatology  [address]  [place], [place] [address]  Phone:[phone]  Fax:[phone]    NOTE: This report is transcribed by using voice recognition software dragon. Every effort is made to ensure accuracy; however, inadvertent computerized transcription errors may be present.                   Harley Hallmark, MD, 09/29/2015 5:11 PM

## 2015-09-28 NOTE — Patient Instructions (Signed)
1.Continue with current medications as indicated      2. Check laboratory studies today to evaluate for any evidence of drug toxicity or side effects      3. Return visit for followup in 4 months or sooner if needed    Lab Results   Component Value Date    WBC 5.1 06/02/2015    HGB 14.0 06/02/2015    HCT 41.6 06/02/2015    MCV 95.9 06/02/2015    PLT 300 06/02/2015       Chemistry        Component Value Date/Time    NA 138 06/02/2015 1148    K 4.9 06/02/2015 1148    CL 95 (L) 06/02/2015 1148    CO2 27 06/02/2015 1148    BUN 10 06/02/2015 1148    CREATININE 0.6 06/02/2015 1148        Component Value Date/Time    CALCIUM 9.8 06/02/2015 1148    ALKPHOS 98 06/02/2015 1148    AST 19 06/02/2015 1148    ALT 13 06/02/2015 1148    BILITOT 0.4 06/02/2015 1148          Lab Results   Component Value Date    SEDRATE 19 06/02/2015     Lab Results   Component Value Date    CRP 5.0 06/02/2015       .

## 2015-09-29 ENCOUNTER — Ambulatory Visit
Admit: 2015-09-29 | Discharge: 2015-09-29 | Payer: BLUE CROSS/BLUE SHIELD | Attending: Rheumatology | Primary: Gerontology

## 2015-09-29 DIAGNOSIS — M329 Systemic lupus erythematosus, unspecified: Secondary | ICD-10-CM

## 2015-09-29 MED ORDER — DULOXETINE HCL 60 MG PO CPEP
60 MG | ORAL_CAPSULE | Freq: Every day | ORAL | 3 refills | Status: DC
Start: 2015-09-29 — End: 2015-10-01

## 2015-09-29 MED ORDER — HYDROXYCHLOROQUINE SULFATE 200 MG PO TABS
200 MG | ORAL_TABLET | Freq: Two times a day (BID) | ORAL | 3 refills | Status: DC
Start: 2015-09-29 — End: 2015-10-01

## 2015-09-30 LAB — COMPREHENSIVE METABOLIC PANEL
ALT: 16 U/L (ref 10–40)
AST: 19 U/L (ref 15–37)
Albumin/Globulin Ratio: 1.3 (ref 1.1–2.2)
Albumin: 4.4 g/dL (ref 3.4–5.0)
Alkaline Phosphatase: 92 U/L (ref 40–129)
Anion Gap: 15 (ref 3–16)
BUN: 11 mg/dL (ref 7–20)
CO2: 25 mmol/L (ref 21–32)
Calcium: 9.6 mg/dL (ref 8.3–10.6)
Chloride: 96 mmol/L — ABNORMAL LOW (ref 99–110)
Creatinine: 0.6 mg/dL (ref 0.6–1.1)
GFR African American: >60 (ref >60)
GFR Non-African American: >60 (ref >60)
Globulin: 3.3 g/dL
Glucose: 89 mg/dL (ref 70–99)
Potassium: 4.3 mmol/L (ref 3.5–5.1)
Sodium: 136 mmol/L (ref 136–145)
Total Bilirubin: 0.5 mg/dL (ref 0.0–1.0)
Total Protein: 7.7 g/dL (ref 6.4–8.2)

## 2015-09-30 LAB — CBC WITH AUTO DIFFERENTIAL
Basophils %: 0.7 %
Basophils Absolute: 0 10*3/uL (ref 0.0–0.2)
Eosinophils %: 4.4 %
Eosinophils Absolute: 0.3 10*3/uL (ref 0.0–0.6)
Hematocrit: 42.4 % (ref 36.0–48.0)
Hemoglobin: 14.1 g/dL (ref 12.0–16.0)
Lymphocytes %: 32.4 %
Lymphocytes Absolute: 1.8 10*3/uL (ref 1.0–5.1)
MCH: 31.5 pg (ref 26.0–34.0)
MCHC: 33.1 g/dL (ref 31.0–36.0)
MCV: 95.1 fL (ref 80.0–100.0)
MPV: 7.6 fL (ref 5.0–10.5)
Monocytes %: 15.5 %
Monocytes Absolute: 0.9 10*3/uL (ref 0.0–1.3)
Neutrophils %: 47 %
Neutrophils Absolute: 2.7 10*3/uL (ref 1.7–7.7)
Platelets: 313 10*3/uL (ref 135–450)
RBC: 4.47 M/uL (ref 4.00–5.20)
RDW: 12.9 % (ref 12.4–15.4)
WBC: 5.7 10*3/uL (ref 4.0–11.0)

## 2015-09-30 LAB — ANA
ANA Titer: 1:160 {titer}
ANA: POSITIVE — AB

## 2015-09-30 LAB — SEDIMENTATION RATE: Sed Rate: 16 mm/Hr (ref 0–30)

## 2015-09-30 LAB — C3 COMPLEMENT: C3 Complement: 139.2 mg/dL (ref 90.0–180.0)

## 2015-09-30 LAB — C-REACTIVE PROTEIN: CRP: 5.1 mg/L (ref 0.0–5.1)

## 2015-09-30 LAB — C4 COMPLEMENT: C4 Complement: 24.8 mg/dL (ref 10.0–40.0)

## 2015-10-01 ENCOUNTER — Encounter

## 2015-10-01 LAB — ANTI-DNA ANTIBODY, DOUBLE-STRANDED: Double Stranded Dna Ab, Igg: NOT DETECTED

## 2015-10-01 MED ORDER — HYDROXYCHLOROQUINE SULFATE 200 MG PO TABS
200 MG | ORAL_TABLET | ORAL | 2 refills | Status: DC
Start: 2015-10-01 — End: 2016-06-14

## 2015-10-01 MED ORDER — DULOXETINE HCL 60 MG PO CPEP
60 MG | ORAL_CAPSULE | ORAL | 2 refills | Status: DC
Start: 2015-10-01 — End: 2016-06-14

## 2016-01-25 NOTE — Patient Instructions (Signed)
1.Continue with current medications as indicated      2. Check laboratory studies today to evaluate for any evidence of drug toxicity or side effects      3. Return visit for followup in 4 months or sooner if needed    Lab Results   Component Value Date    WBC 5.7 09/29/2015    HGB 14.1 09/29/2015    HCT 42.4 09/29/2015    MCV 95.1 09/29/2015    PLT 313 09/29/2015       Chemistry        Component Value Date/Time    NA 136 09/29/2015 1222    K 4.3 09/29/2015 1222    CL 96 (L) 09/29/2015 1222    CO2 25 09/29/2015 1222    BUN 11 09/29/2015 1222    CREATININE 0.6 09/29/2015 1222        Component Value Date/Time    CALCIUM 9.6 09/29/2015 1222    ALKPHOS 92 09/29/2015 1222    AST 19 09/29/2015 1222    ALT 16 09/29/2015 1222    BILITOT 0.5 09/29/2015 1222          Lab Results   Component Value Date    SEDRATE 16 09/29/2015     Lab Results   Component Value Date    CRP 5.1 09/29/2015

## 2016-01-25 NOTE — Progress Notes (Signed)
[name]  [facility] of [place]  Rheumatology Progress Note  Harley Hallmark, MD             [facility] Rheumatology           [facility]umat[facility]                                      [address]                  [address]  [address]                     [place], [place] [address]                [place], [place] [address]      [phone]                [phone]      Fax: [phone]                 Fax: [phone]           ________________________________________________________________________                       Patient Name:  Molly [name] is a 55 y.o. patient     Returns today for followup of systemic lupus and osteoarthritis. Since last seen, she Remains on Plaquenil 200 mg twice daily and compound cream.  Has been doing well on these medications .  She also remains on Cymbalta  60 mg daily in the morning. She denies having any mouth sores or ulcers, or any  skin rashes. continues to work.  Overall feeling well and stable on current management.  However she continues to have ongoing difficulty involving her left knee. She is planning on having left total knee replacement later on in the fall.  Despite this she hasn't missed any days of work.   Tolerating her medications well without side effects or difficulties.    Laboratory studies that had been done in the past were reviewed.    l History:        Diagnosis Date   ??? Amenorrhea    ??? Cancer (HCC)    ??? Diabetes mellitus (HCC)    ??? History of non-Hodgkin's lymphoma 2006    treated with radiation and chemo   ??? Obesity    ??? Systemic lupus     see Dr Lehman Prom       Past Surgical History:        Procedure Laterality Date   ??? KNEE SURGERY      ACL and meniscus repair   ??? LEEP      in 1990's       Medications :    Prior to Admission medications    Medication Sig Start Date End Date Taking? Authorizing Provider   hydroxychloroquine (PLAQUENIL) 200 MG tablet TAKE 1 TABLET TWICE A DAY 10/01/15  Yes Harley Hallmark, MD   DULoxetine (CYMBALTA) 60 MG extended release capsule TAKE 1 CAPSULE DAILY 10/01/15  Yes Harley Hallmark, MD   nabumetone (RELAFEN) 500 MG tablet Take 1 tablet by mouth 2 times daily Take with food 06/02/15  Yes Harley Hallmark, MD   vitamin D (CHOLECALCIFEROL) 1000 UNIT TABS tablet Take 1,000 Units by mouth daily Twice a day   Yes Historical Provider, MD  acetaminophen (TYLENOL) 650 MG CR tablet Take 650 mg by mouth every 8 hours as needed for Pain   Yes Historical Provider, MD   lidocaine (LIDODERM) 5 % Place 1 patch onto the skin daily 12 hours on, 12 hours off. 06/02/15   Harley HallmarkSoha Tinzlee Craker, MD         Review of systems:       GENERAL:Denies having any fatigue or unintentional weight loss  HEENT:  Denies having any  Oral lesions, Or sores;lip dryness  LUNGS:Denies having had any new breathing difficulties  ZO:XWRUEAGI:Denies having any GI upset from medications  MUSCULOSKELETAL: Left knee pain, swelling and tenderness   SKIN: New skin lesions mainly involving  left upper extremity  LYMPHADENOPATHY: denies having had any recent infectious illness or lymphadenopathy      Physical exam:       Wt Readings from Last 3 Encounters:   06/02/15 (!) 303 lb 6.4 oz (137.6 kg)   01/27/15 (!) 302 lb (137 kg)   09/23/14 (!) 304 lb 6.4 oz (138.1 kg)     Temp Readings from Last 3 Encounters:   12/02/13 98.1 ??F (36.7 ??C) (Oral)     BP Readings from Last 3 Encounters:   01/26/16 (!) 140/90   09/29/15 (!) 138/98   06/02/15 132/74     Pulse Readings from Last 3 Encounters:   01/26/16 74   09/29/15 88   06/02/15 80           GENERAL:Able to ambulate without any assistance.  HEENT: no evidence of any oral ulcers, lesions or sores  LUNGS:clear to auscultation bilaterally, good air intake  CARDIOVASCULAR:regular rate  MUSCULOSKELETAL:Warmth, swelling, tenderness involving the left knee. Otherwise no evidence of synovitis swelling warmth or tenderness of patient's upper or lower peripheral joints.  Skin:Skin is warm and dry. No Petechiae, telangiectasia,  purpura, or rash. Palpation of the skin and subcutaneous tissues reveals no evidence of induration, subcutaneous nodule or tenderness. Noted left  Upper  extremity  poor wound healing   Lymphadenopathy: no evidence of any palpable cervical or supraclavicular lymph nodes    Labs:       Lab Results   Component Value Date    WBC 5.7 09/29/2015    HGB 14.1 09/29/2015    HCT 42.4 09/29/2015    MCV 95.1 09/29/2015    PLT 313 09/29/2015       Chemistry        Component Value Date/Time    NA 136 09/29/2015 1222    K 4.3 09/29/2015 1222    CL 96 (L) 09/29/2015 1222    CO2 25 09/29/2015 1222    BUN 11 09/29/2015 1222    CREATININE 0.6 09/29/2015 1222        Component Value Date/Time    CALCIUM 9.6 09/29/2015 1222    ALKPHOS 92 09/29/2015 1222    AST 19 09/29/2015 1222    ALT 16 09/29/2015 1222    BILITOT 0.5 09/29/2015 1222          Lab Results   Component Value Date    SEDRATE 16 09/29/2015     Lab Results   Component Value Date    ANA POSITIVE 09/29/2015    C3 139.2 09/29/2015    C3 137.6 06/02/2015    C3 135.0 01/27/2015    C4 24.8 09/29/2015    C4 24.0 06/02/2015    C4 25.1 01/27/2015           Assessment/Plan:  Assessment/Plan:       Assessment/Plan:  Leason was seen today for follow-up.    Diagnoses and all orders for this visit:    Systemic lupus erythematosus (HCC)???currently stable on Plaquenil 200 mg twice a day and compound cream.  Good prognosis.  Continue.       -     CBC Auto Differential  -     Comprehensive Metabolic Panel  -     C-Reactive Protein  -     Sedimentation Rate  -     ANA  -     Anti-DNA Antibody, Double-Stranded  -     C3 Complement  -     C4 Complement  -     hydroxychloroquine (PLAQUENIL) 200 MG tablet; Take 1 tablet by mouth 2 times daily    High risk medication use  - Will check labs to evaluate for any evidence of drug toxicity or side effects.    -     CBC Auto Differential  -     Comprehensive Metabolic Panel    Primary osteoarthritis of left knee???currently following up with Dr. Maisie Fus.   Planning on having surgery in the near future    Primary osteoarthritis of one knee, unspecified laterality  -     DULoxetine (CYMBALTA) 60 MG extended release capsule; Take 1 capsule by mouth daily      Return in about 4 months (around 05/27/2016).      The risks and benefits of my recommendations, as well as other treatment options, benefits and side effects were discussed with the patient today. Questions were answered.    Letitia Libra  [facility]  Internists of [facility] and Rheumatology  [address]  [place], [place] [address]  [phone]  Fax:[phone]    NOTE: This report is transcribed by using voice recognition software dragon. Every effort is made to ensure accuracy; however, inadvertent computerized transcription errors may be present.                   Harley Hallmark, MD, 01/26/2016 11:28 AM

## 2016-01-26 ENCOUNTER — Ambulatory Visit
Admit: 2016-01-26 | Discharge: 2016-01-26 | Payer: BLUE CROSS/BLUE SHIELD | Attending: Rheumatology | Primary: Gerontology

## 2016-01-26 DIAGNOSIS — M329 Systemic lupus erythematosus, unspecified: Secondary | ICD-10-CM

## 2016-01-27 LAB — HEPATIC FUNCTION PANEL
ALT: 14 U/L (ref 10–40)
AST: 19 U/L (ref 15–37)
Albumin: 4.9 g/dL (ref 3.4–5.0)
Alkaline Phosphatase: 86 U/L (ref 40–129)
Bilirubin, Direct: <0.2 mg/dL (ref 0.0–0.3)
Total Bilirubin: 0.5 mg/dL (ref 0.0–1.0)
Total Protein: 8.3 g/dL — ABNORMAL HIGH (ref 6.4–8.2)

## 2016-01-27 LAB — URINALYSIS
Bilirubin Urine: NEGATIVE
Blood, Urine: NEGATIVE
Glucose, Ur: NEGATIVE mg/dL
Ketones, Urine: NEGATIVE mg/dL
Leukocyte Esterase, Urine: NEGATIVE
Nitrite, Urine: NEGATIVE
Protein, UA: NEGATIVE mg/dL
Specific Gravity, UA: 1.017 (ref 1.005–1.030)
Urobilinogen, Urine: 0.2 E.U./dL (ref <2.0)
pH, UA: 6.5 (ref 5.0–8.0)

## 2016-01-27 LAB — CBC WITH AUTO DIFFERENTIAL
Basophils %: 0.6 %
Basophils Absolute: 0 10*3/uL (ref 0.0–0.2)
Eosinophils %: 2.1 %
Eosinophils Absolute: 0.1 10*3/uL (ref 0.0–0.6)
Hematocrit: 43.6 % (ref 36.0–48.0)
Hemoglobin: 14.2 g/dL (ref 12.0–16.0)
Lymphocytes %: 29.1 %
Lymphocytes Absolute: 1.6 10*3/uL (ref 1.0–5.1)
MCH: 31.8 pg (ref 26.0–34.0)
MCHC: 32.6 g/dL (ref 31.0–36.0)
MCV: 97.7 fL (ref 80.0–100.0)
MPV: 7.5 fL (ref 5.0–10.5)
Monocytes %: 13.4 %
Monocytes Absolute: 0.7 10*3/uL (ref 0.0–1.3)
Neutrophils %: 54.8 %
Neutrophils Absolute: 3 10*3/uL (ref 1.7–7.7)
Platelets: 334 10*3/uL (ref 135–450)
RBC: 4.47 M/uL (ref 4.00–5.20)
RDW: 13.4 % (ref 12.4–15.4)
WBC: 5.4 10*3/uL (ref 4.0–11.0)

## 2016-01-27 LAB — ANA
ANA Titer: 1:320 {titer}
ANA: POSITIVE — AB

## 2016-01-27 LAB — C-REACTIVE PROTEIN: CRP: 3.7 mg/L (ref 0.0–5.1)

## 2016-01-27 LAB — CREATININE
Creatinine: 0.6 mg/dL (ref 0.6–1.1)
GFR African American: >60 (ref >60)
GFR Non-African American: >60 (ref >60)

## 2016-01-27 LAB — C3 COMPLEMENT: C3 Complement: 140.9 mg/dL (ref 90.0–180.0)

## 2016-01-27 LAB — C4 COMPLEMENT: C4 Complement: 25.4 mg/dL (ref 10.0–40.0)

## 2016-01-28 LAB — ANTI-DNA ANTIBODY, DOUBLE-STRANDED: Double Stranded Dna Ab, Igg: DETECTED — AB

## 2016-01-30 LAB — ANTI-DNA AB, DOUBLE-STRANDED IGG TITER: dsDNA Ab Titer: 1:160 {titer} — ABNORMAL HIGH

## 2016-05-31 ENCOUNTER — Encounter: Attending: Rheumatology | Primary: Gerontology

## 2016-06-14 ENCOUNTER — Encounter

## 2016-06-14 MED ORDER — DULOXETINE HCL 60 MG PO CPEP
60 MG | ORAL_CAPSULE | ORAL | 0 refills | Status: DC
Start: 2016-06-14 — End: 2016-09-12

## 2016-06-14 MED ORDER — HYDROXYCHLOROQUINE SULFATE 200 MG PO TABS
200 MG | ORAL_TABLET | ORAL | 0 refills | Status: DC
Start: 2016-06-14 — End: 2016-09-12

## 2016-06-29 ENCOUNTER — Ambulatory Visit
Admit: 2016-06-29 | Discharge: 2016-06-29 | Payer: PRIVATE HEALTH INSURANCE | Attending: Rheumatology | Primary: Gerontology

## 2016-06-29 DIAGNOSIS — M3219 Other organ or system involvement in systemic lupus erythematosus: Secondary | ICD-10-CM

## 2016-06-29 MED ORDER — CYCLOBENZAPRINE HCL 5 MG PO TABS
5 MG | ORAL_TABLET | ORAL | 5 refills | Status: DC
Start: 2016-06-29 — End: 2016-10-11

## 2016-06-29 NOTE — Progress Notes (Signed)
[facility]  [facility] [place]  Rheumatology Progress Note  Harley HallmarkSoha Kevan Prouty, MD            [x]  [facility]         []   [facility]                                      [address]                  [address]                     [place] [id number]                [place], [place] [id number]      [phone]                [phone]      Fax: [phone]                 Fax: [phone]           ________________________________________________________________________                       Patient Name:  Molly [name] is a 55 y.o. patient     Returns today for followup of systemic lupus and osteoarthritis. Since last seen, she Remains on Plaquenil 400 mg nightly, Relafen 500 mg twice a day and compound cream.  Has been doing well on these medications .  She also remains on Cymbalta  60 mg daily in the morning. She denies having any mouth sores or ulcers, or any  skin rashes. continues to work and has not missed any days of work secondary to her systemic lupus or osteoarthritis.Marland Kitchen.  However with the weather change, she has diffuse myalgias and arthralgias.  Biggest complaint remains that of her left knee.  Recently had a change in her insurance and now is looking for a new Investment banker, operationalorthopedic surgeon.  Recently has received corticosteroid injection into her left knee which did help her with her symptoms.  She has been having increasing cervical neck stiffness with radiation into bilateral shoulders.  Sleep has been poor secondary to that.  She also notes that she's having visual changes and has not had a recent eye examination for follow-up of any eye disease secondary to the Plaquenil.      Laboratory studies that had been done in the past were reviewed.    l History:        Diagnosis Date   ??? Amenorrhea    ??? Cancer (HCC)    ??? Diabetes mellitus (HCC)    ??? History of non-Hodgkin's lymphoma 2006    treated with radiation and chemo    ??? Obesity    ??? Systemic lupus     see Dr [name]       Past Surgical History:        Procedure Laterality Date   ??? KNEE SURGERY      ACL and meniscus repair   ??? LEEP      in 1990's       Medications :    Prior to Admission medications    Medication Sig Start Date End Date Taking? Authorizing Provider   hydroxychloroquine (PLAQUENIL) 200 MG tablet TAKE 1 TABLET TWICE A DAY 06/14/16  Yes Harley HallmarkSoha Verona Hartshorn, MD   DULoxetine (CYMBALTA) 60 MG extended release capsule TAKE 1 CAPSULE DAILY 06/14/16  Yes Harley HallmarkSoha Carlisha Wisler, MD   vitamin D (CHOLECALCIFEROL) 1000 UNIT TABS tablet Take 1,000 Units by mouth daily Twice a day   Yes Historical Provider, MD   acetaminophen (TYLENOL) 650 MG CR tablet Take 650 mg by mouth every 8 hours as needed for Pain   Yes Historical Provider, MD         Review of systems:       GENERAL:Denies having any fatigue or unintentional weight loss  HEENT:  Denies having any  Oral lesions, Or sores;lip dryness  LUNGS:Denies having had any new breathing difficulties  ZO:XWRUEAGI:Denies having any GI upset from medications  MUSCULOSKELETAL: Left knee pain,  and tenderness   SKIN:skin lesions mainly involving  left upper extremity  LYMPHADENOPATHY: denies having had any recent infectious illness or lymphadenopathy      Physical exam:       Wt Readings from Last 3 Encounters:   06/29/16 (!) 310 lb 3.2 oz (140.7 kg)   06/02/15 (!) 303 lb 6.4 oz (137.6 kg)   01/27/15 (!) 302 lb (137 kg)     BP Readings from Last 3 Encounters:   06/29/16 (!) 146/90   01/26/16 (!) 140/90   09/29/15 (!) 138/98     Pulse Readings from Last 3 Encounters:   06/29/16 80   01/26/16 74   09/29/15 88           GENERAL:Able to ambulate without any assistance.  HEENT: no evidence of any oral ulcers, lesions or sores  LUNGS:clear to auscultation bilaterally, good air intake  CARDIOVASCULAR:regular rate  MUSCULOSKELETAL: Minimal   tenderness involving the left knee. Otherwise no evidence of synovitis swelling warmth or tenderness of patient's upper or lower  peripheral joints.  Skin:Skin is warm and dry. No Petechiae, telangiectasia, purpura, or rash. Palpation of the skin and subcutaneous tissues reveals no evidence of induration, subcutaneous nodule or tenderness. Noted left  Upper  extremity  poor wound healing   Lymphadenopathy: no evidence of any palpable cervical or supraclavicular lymph nodes    Labs:       Lab Results   Component Value Date    WBC 5.4 01/26/2016    HGB 14.2 01/26/2016    HCT 43.6 01/26/2016    MCV 97.7 01/26/2016    PLT 334 01/26/2016       Chemistry        Component Value Date/Time    NA 136 09/29/2015 1222    K 4.3 09/29/2015 1222    CL 96 (L) 09/29/2015 1222    CO2 25 09/29/2015 1222    BUN 11 09/29/2015 1222    CREATININE 0.6 01/26/2016 1139        Component Value Date/Time    CALCIUM 9.6 09/29/2015 1222    ALKPHOS 86 01/26/2016 1139    AST 19 01/26/2016 1139    ALT 14 01/26/2016 1139    BILITOT 0.5 01/26/2016 1139          Lab Results   Component Value Date    SEDRATE 16 09/29/2015     Lab Results   Component Value Date    ANA POSITIVE 01/26/2016    C3 140.9 01/26/2016    C3 139.2 09/29/2015    C3 137.6 06/02/2015    C4 25.4 01/26/2016    C4 24.8 09/29/2015    C4 24.0 06/02/2015       Assessment/Plan:  Assessment/Plan:  Molly [name] was seen today for lupus.    Diagnoses and all orders for this visit:    Other systemic lupus erythematosus with other organ involvement (HCC)- well and stable currently on Plaquenil 400 mg daily at bedtime and Relafen 500 mg daily???twice a day when necessary severe pain.  Continue with current management.  Flexeril to use when necessary cervical neck stiffness and spasm.Risks, side effects and warning signs were fully discussed with patient.  Reading information was given to patient to review.    -     CBC Auto Differential  -     C-Reactive Protein  -     Creatinine, Serum  -     Hepatic Function Panel  -     ANA  -     Urinalysis  -     Anti-DNA Antibody, Double-Stranded  -     C3 Complement  -     C4  Complement  -     External Referral To Ophthalmology  -     cyclobenzaprine (FLEXERIL) 5 MG tablet; Take 1???2 tablets nightly as needed    High risk medication use - Will check labs to evaluate for any evidence of drug toxicity or side effects.\\    -     CBC Auto Differential  -     C-Reactive Protein  -     Creatinine, Serum  -     Hepatic Function Panel    Primary osteoarthritis of one knee- names of orthopedic surgeons given to patient to establish care        Return in about 4 months (around 10/27/2016).      The risks and benefits of my recommendations, as well as other treatment options, benefits and side effects were discussed with the patient today. Questions were answered.    Letitia Libra  [facility]  Internists of [facility] and Rheumatology  [address]  [place], [place] [address]  [phone]  Fax:[phone]    NOTE: This report is transcribed by using voice recognition software dragon. Every effort is made to ensure accuracy; however, inadvertent computerized transcription errors may be present.                         Harley Hallmark, MD, 06/29/2016 11:18 AM

## 2016-06-29 NOTE — Patient Instructions (Addendum)
1.Continue with current medications as indicated      2. Check laboratory studies today to evaluate for any evidence of drug toxicity or side effects      3. Return visit for followup in 4 months or sooner if needed    Lab Results   Component Value Date    WBC 5.4 01/26/2016    HGB 14.2 01/26/2016    HCT 43.6 01/26/2016    MCV 97.7 01/26/2016    PLT 334 01/26/2016       Chemistry        Component Value Date/Time    NA 136 09/29/2015 1222    K 4.3 09/29/2015 1222    CL 96 (L) 09/29/2015 1222    CO2 25 09/29/2015 1222    BUN 11 09/29/2015 1222    CREATININE 0.6 01/26/2016 1139        Component Value Date/Time    CALCIUM 9.6 09/29/2015 1222    ALKPHOS 86 01/26/2016 1139    AST 19 01/26/2016 1139    ALT 14 01/26/2016 1139    BILITOT 0.5 01/26/2016 1139          Lab Results   Component Value Date    SEDRATE 16 09/29/2015     Lab Results   Component Value Date    CRP 3.7 01/26/2016         Patient Education          cyclobenzaprine  Pronunciation:  sye kloe BEN za preen  Brand:  Amrix, Comfort Pac with Cyclobenzaprine, Fexmid  What is the most important information I should know about cyclobenzaprine?  You should not use cyclobenzaprine if you have a thyroid disorder, heart block, congestive heart failure, a heart rhythm disorder, or you have recently had a heart attack.  Do not use cyclobenzaprine if you have taken an MAO inhibitor in the past 14 days, such as isocarboxazid, linezolid, phenelzine, rasagiline, selegiline, or tranylcypromine.  What is cyclobenzaprine?  Cyclobenzaprine is a muscle relaxant. It works by blocking nerve impulses (or pain sensations) that are sent to your brain.  Cyclobenzaprine is used together with rest and physical therapy to treat skeletal muscle conditions such as pain or injury.  Cyclobenzaprine may also be used for purposes not listed in this medication guide.  What should I discuss with my healthcare provider before taking cyclobenzaprine?  Do not use cyclobenzaprine if you have taken  an MAO inhibitor in the past 14 days. A dangerous drug interaction could occur. MAO inhibitors include isocarboxazid, linezolid, phenelzine, rasagiline, selegiline, and tranylcypromine.  You should not use cyclobenzaprine if you are allergic to it, or if you have:  ?? a heart rhythm disorder, or you have recently had a heart attack;  ?? congestive heart failure;  ?? heart block; or  ?? a thyroid disorder.  To make sure cyclobenzaprine is safe for you, tell your doctor if you have:  ?? liver disease;  ?? glaucoma;  ?? enlarged prostate; or  ?? problems with urination.  Older adults may be more sensitive to the side effects of this medicine.  Cyclobenzaprine is not expected to harm an unborn baby. Tell your doctor if you are pregnant or plan to become pregnant during treatment.  It is not known whether cyclobenzaprine passes into breast milk or if it could harm a nursing baby. Tell your doctor if you are breast-feeding a baby.  How should I take cyclobenzaprine?  Cyclobenzaprine is usually taken once daily for only 2 or 3 weeks. Follow  all directions on your prescription label. Do not take this medicine in larger or smaller amounts or for longer than recommended.  Take the medicine at the same time each day.  Do not crush, chew, break, or open an extended-release capsule. Swallow it whole.  You may have unpleasant withdrawal symptoms when you stop taking cyclobenzaprine after long-term use. Ask your doctor how to avoid withdrawal symptoms when you stop using this medicine.  Cyclobenzaprine is only part of a complete program of treatment that may also include rest, physical therapy, or other pain relief measures. Follow your doctor's instructions.  Store at room temperature away from moisture, heat, and light.  What happens if I miss a dose?  Take the missed dose as soon as you remember. Skip the missed dose if it is almost time for your next scheduled dose. Do not take extra medicine to make up the missed dose.  What happens  if I overdose?  Seek emergency medical attention or call the Poison Help line at [phone]. An overdose of cyclobenzaprine can be fatal.  What should I avoid while taking cyclobenzaprine?  This medication may impair your thinking or reactions. Be careful if you drive or do anything that requires you to be alert.  Avoid drinking alcohol. Dangerous side effects could occur.  What are the possible side effects of cyclobenzaprine?  Get emergency medical help if you have signs of an allergic reaction: hives; difficult breathing; swelling of your face, lips, tongue, or throat.  Stop using cyclobenzaprine and call your doctor at once if you have:  ?? severe drowsiness, fast heart rate;  ?? tremors or shaking;  ?? pounding heartbeats or fluttering in your chest; or  ?? agitation, hallucinations, fever, overactive reflexes, nausea, vomiting, diarrhea, loss of coordination, fainting.  Common side effects may include:  ?? headache, dizziness;  ?? drowsiness, tired feeling;  ?? trouble concentrating;  ?? blurred vision, dry mouth or throat, altered sense of taste; or  ?? nausea, upset stomach, constipation.  This is not a complete list of side effects and others may occur. Call your doctor for medical advice about side effects. You may report side effects to FDA at 1-800-FDA-1088.  What other drugs will affect cyclobenzaprine?  Taking cyclobenzaprine with other drugs that make you sleepy or slow your breathing can cause dangerous or life-threatening side effects. Ask your doctor before taking a sleeping pill, narcotic pain medicine, prescription cough medicine, a muscle relaxer, or medicine for anxiety, depression, or seizures.  This list is not complete. Other drugs may interact with cyclobenzaprine, including prescription and over-the-counter medicines, vitamins, and herbal products. Not all possible interactions are listed in this medication guide.  Where can I get more information?  Your pharmacist can provide more information  about cyclobenzaprine.    Remember, keep this and all other medicines out of the reach of children, never share your medicines with others, and use this medication only for the indication prescribed.  Every effort has been made to ensure that the information provided by [employer] ('[employer]') is accurate, up-to-date, and complete, but no guarantee is made to that effect. Drug information contained herein may be time sensitive. [employer] information has been compiled for use by healthcare practitioners and consumers in the [place] and therefore [employer] does not warrant that uses outside of the [place] are appropriate, unless specifically indicated otherwise. [employer]'s drug information does not endorse drugs, diagnose patients or recommend therapy. [employer]'s drug information is an Armed forces technical officerinformational resource designed to assist  licensed healthcare practitioners in caring for their patients and/or to serve consumers viewing this service as a supplement to, and not a substitute for, the expertise, skill, knowledge and judgment of healthcare practitioners. The absence of a warning for a given drug or drug combination in no way should be construed to indicate that the drug or drug combination is safe, effective or appropriate for any given patient. [employer] does not assume any responsibility for any aspect of healthcare administered with the aid of information [employer] provides. The information contained herein is not intended to cover all possible uses, directions, precautions, warnings, drug interactions, allergic reactions, or adverse effects. If you have questions about the drugs you are taking, check with your doctor, nurse or pharmacist.  Copyright [phone])028-2391 [employer] Version: 4.03. Revision date: 05/11/2015.  Care instructions adapted under license by [facility] If you have questions about a medical condition or this instruction, always ask your healthcare professional. [employer]  disclaims any warranty or liability for your use of this information.

## 2016-06-30 LAB — CBC WITH AUTO DIFFERENTIAL
Basophils %: 0.5 %
Basophils Absolute: 0 10*3/uL (ref 0.0–0.2)
Eosinophils %: 4.3 %
Eosinophils Absolute: 0.3 10*3/uL (ref 0.0–0.6)
Hematocrit: 43.1 % (ref 36.0–48.0)
Hemoglobin: 14.3 g/dL (ref 12.0–16.0)
Lymphocytes %: 29 %
Lymphocytes Absolute: 1.9 10*3/uL (ref 1.0–5.1)
MCH: 32.3 pg (ref 26.0–34.0)
MCHC: 33.3 g/dL (ref 31.0–36.0)
MCV: 97 fL (ref 80.0–100.0)
MPV: 7.6 fL (ref 5.0–10.5)
Monocytes %: 16.2 %
Monocytes Absolute: 1 10*3/uL (ref 0.0–1.3)
Neutrophils %: 50 %
Neutrophils Absolute: 3.2 10*3/uL (ref 1.7–7.7)
Platelets: 361 10*3/uL (ref 135–450)
RBC: 4.44 M/uL (ref 4.00–5.20)
RDW: 12.7 % (ref 12.4–15.4)
WBC: 6.5 10*3/uL (ref 4.0–11.0)

## 2016-06-30 LAB — MICROSCOPIC URINALYSIS
Epithelial Cells, UA: 1 /HPF (ref 0–5)
Hyaline Casts, UA: 0 /LPF (ref 0–8)
RBC, UA: 1 /HPF (ref 0–4)
WBC, UA: 1 /HPF (ref 0–5)

## 2016-06-30 LAB — HEPATIC FUNCTION PANEL
ALT: 17 U/L (ref 10–40)
AST: 20 U/L (ref 15–37)
Albumin: 4.8 g/dL (ref 3.4–5.0)
Alkaline Phosphatase: 100 U/L (ref 40–129)
Bilirubin, Direct: <0.2 mg/dL (ref 0.0–0.3)
Total Bilirubin: 0.4 mg/dL (ref 0.0–1.0)
Total Protein: 8.2 g/dL (ref 6.4–8.2)

## 2016-06-30 LAB — CREATININE
Creatinine: 0.6 mg/dL (ref 0.6–1.1)
GFR African American: >60 (ref >60)
GFR Non-African American: >60 (ref >60)

## 2016-06-30 LAB — C3 COMPLEMENT: C3 Complement: 156.1 mg/dL (ref 90.0–180.0)

## 2016-06-30 LAB — ANA
ANA Titer: 1:160 {titer}
ANA: POSITIVE — AB

## 2016-06-30 LAB — URINALYSIS
Bilirubin Urine: NEGATIVE
Blood, Urine: NEGATIVE
Glucose, Ur: NEGATIVE mg/dL
Ketones, Urine: NEGATIVE mg/dL
Nitrite, Urine: NEGATIVE
Protein, UA: NEGATIVE mg/dL
Specific Gravity, UA: 1.008 (ref 1.005–1.030)
Urobilinogen, Urine: 0.2 E.U./dL (ref <2.0)
pH, UA: 7 (ref 5.0–8.0)

## 2016-06-30 LAB — C-REACTIVE PROTEIN: CRP: 7 mg/L — ABNORMAL HIGH (ref 0.0–5.1)

## 2016-06-30 LAB — C4 COMPLEMENT: C4 Complement: 26.7 mg/dL (ref 10.0–40.0)

## 2016-07-01 LAB — ANTI-DNA ANTIBODY, DOUBLE-STRANDED: Double Stranded Dna Ab, Igg: DETECTED — AB

## 2016-07-03 LAB — ANTI-DNA AB, DOUBLE-STRANDED IGG TITER: dsDNA Ab Titer: 1:40 {titer} — ABNORMAL HIGH

## 2016-08-12 ENCOUNTER — Encounter: Attending: Physician Assistant | Primary: Gerontology

## 2016-09-12 ENCOUNTER — Ambulatory Visit
Admit: 2016-09-12 | Discharge: 2016-09-12 | Payer: PRIVATE HEALTH INSURANCE | Attending: Orthopaedic Surgery | Primary: Gerontology

## 2016-09-12 ENCOUNTER — Inpatient Hospital Stay: Admit: 2016-09-20 | Attending: Rehabilitative and Restorative Service Providers" | Primary: Gerontology

## 2016-09-12 ENCOUNTER — Encounter

## 2016-09-12 ENCOUNTER — Ambulatory Visit: Admit: 2016-09-12 | Payer: PRIVATE HEALTH INSURANCE | Primary: Gerontology

## 2016-09-12 DIAGNOSIS — M1712 Unilateral primary osteoarthritis, left knee: Secondary | ICD-10-CM

## 2016-09-12 NOTE — Patient Instructions (Signed)
Management discussed and patient voiced understanding.  They were instructed to follow up with their PCP regarding any abnormal vitals reading.

## 2016-09-12 NOTE — Progress Notes (Signed)
Chief Complaint    Knee Pain (ongoing lt knee pain, getting worse last 8 wks; hx of acl sx over 20 yrs ago)      History of Present Illness:  Molly [name] is a 56 y.o. female presents to the office today for a new problem.  Chief complaint left knee pain.  Patient sent in orthopedic consultation by Dr. Merla Riches.  Patient's husband is Dr. Sherryll Burger psychologist from [name].  Patient has had a long history of left knee pain.  Patient had a ACL reconstruction done by Dr. Henrene Hawking 20 years ago.  Dr. Caralee Ates has been performing Visco supplementation and cortisone injections.  Her last injection was in July.  Her symptoms are progressively gotten worse and the injections are no longer lasting 6 months.  Her pain is concentrated over her lateral greater than medial aspect of her knee.  Increased pain with activities and improvement with rest.  Patient does suffer from lupus and takes Plaquenil    PAIN:   Pain Assessment  Location of Pain: Knee  Location Modifiers: Left  Severity of Pain: 7  Quality of Pain: Buckling  Frequency of Pain: Intermittent  Aggravating Factors: Walking, Standing  Limiting Behavior: Yes  Work-Related Injury: No  Are there other pain locations you wish to document?: No    The patient???s chronic pain has gradually worsening over the last 5 years. The patient rates pain on a level of 7/10.      Pain impacts patient???s ability to drive, sleep and climb stairs.      Medical History:  Patient's medications, allergies, past medical, surgical, social and family histories were reviewed and updated as appropriate.    Medication Management  Patient has been treated with NSAIDs, Steroids and Visco supplementation with Minimal relief for 2 months.    Review of Systems:  Relevant review of systems reviewed and available in the patient's chart    Vital Signs:  Vitals:    09/12/16 1150   BP: (!) 137/96   Pulse: 104       Body mass index is 41.61 kg/m??.    Limitation in Activities of Daily Living  (ADLs)  The patient is able to ambulate with/without the assistance of cane for Greater than 10 steps  steps/feet.      Walking distance 3 blocks    Patient needs assistance with activities of daily living bathing, cooking and work.     Extended Care / Skilled Nursing Facility: No     Safety Issues: Risk of falls  Patient is at risk for falls/fall history: Yes    General Exam:   Constitutional: Patient is adequately groomed with no evidence of malnutrition  DTRs: Deep tendon reflexes are intact  Mental Status: The patient is oriented to time, place and person.  The patient's mood and affect are appropriate.  Lymphatic: The lymphatic examination bilaterally reveals all areas to be without enlargement or induration.  Vascular: Examination reveals no swelling or calf tenderness.  Peripheral pulses are palpable and 2+.  Neurological: The patient has good coordination.  There is no weakness or sensory deficit.    Left Knee Examination:    Inspection:  No erythema or signs of infection.  There are no cutaneous lesions.  Patient does have valgus alignment.    Palpation:  There is tenderness to palpation along the lateral greater than medial joint line.    Range of Motion:  0 extension to 120 of active flexion.    Strength:  4+/5 quadriceps and hamstrings    Special Tests:  The knee is stable to varus valgus stressing/anterior posterior drawer. Negative Homans test.                                 Skin: There are no rashes, ulcerations or lesions.    Gait: mildly antalgic favoring the right side    Reflex 2+ patellar    Additional Comments:     Examination of the right knee reveals intact skin. There is no focal tenderness. The patient demonstrates full painless range of motion with regard to flexion and extension. Strength is 5/5 throughout all planes. Ligamentous stability is grossly intact.    Examination of the right and left hip reveals intact skin. The patient demonstrates full painless range of motion with regards to  flexion, abduction, internal and external rotation. There is no tenderness about the greater trochanter. There is a negative straight leg raise against resistance. Strength is 5/5 throughout all planes.    Radiology:   X-rays obtained and reviewed in office:  Views 4  Location AP flexed knee lateral and sunrise views of the left knee:    Impression:   There is end-stage arthritis lateral compartment with sclerosis and osteophyte formation present. The patellofemoral joint and medial joint space with significant osteophyte formation.  Hardware present consistent with previous ACL reconstruction  No fractures are identified.          Failed Outpatient management or Previous Surgical Intervention  Patient has undergone conservative treatment of left knee for the past 5 years.  Patient has undergone therapy consisting of Cortisone injections, Visco supplementation, knee braces, and activity modifications for 5 years with no improvement in  pain relief or function.     Impression:  Encounter Diagnosis   Name Primary?   ??? Left knee pain, unspecified chronicity Yes       Office Procedures:  Orders Placed This Encounter   Procedures   ??? XR KNEE LEFT (MIN 4 VIEWS)     [id number]     Order Specific Question:   Reason for exam:     Answer:   Pain       Treatment Plan:      Patient has exhausted conservative measures at this time.  She is scheduled for left total knee arthroplasty-Mako arthroplasty.  She does take Plaquenil window for her lupus and will need to come off of it before surgery.  I will send a copy of her note to her rheumatologist and to her primary care physician.  She will proceed with preoperative laboratory evaluation and CT for intraoperative guidance.  She will also participate in preoperative physical therapy evaluation.    We discussed the risk, benefits, and potential complications of total knee replacement arthroplasty surgery. The patient voiced their understanding to concerns that include infection, deep  vein thrombosis, neurological injury, and delayed rehabilitation. The patient also realizes that there are concerns regarding the potential need for manipulation under anesthesia if range of motion proves to be problematic. The patient also understands that there is always a chance of dystrophy and anesthetic complications that would include a stroke, cardiopulmonary pathology, and even death.  We also discussed the rehabilitation involved with this operation and options that involved not only the hospitalization but then the potential of either going home with visiting home therapy versus going to a rehabilitation facility. We talked about the nuances of each of these  treatments as well as utilization of a continuous passive motion machine (CPM machine). The patient also realizes the need for the hinged knee brace in the rehabilitation process as well as the very significant role that the patient plays in terms of rehabilitation after this type of operation. All questions were answered.

## 2016-09-14 MED ORDER — HYDROXYCHLOROQUINE SULFATE 200 MG PO TABS
200 MG | ORAL_TABLET | ORAL | 0 refills | Status: DC
Start: 2016-09-14 — End: 2016-10-11

## 2016-09-14 MED ORDER — DULOXETINE HCL 60 MG PO CPEP
60 MG | ORAL_CAPSULE | ORAL | 0 refills | Status: DC
Start: 2016-09-14 — End: 2016-10-11

## 2016-09-14 NOTE — Telephone Encounter (Signed)
-----   Message from Harley HallmarkSoha Mousa, MD sent at 09/14/2016  1:43 PM EST -----      ----- Message -----  From: Laurelyn SickleSuresh Nayak, MD  Sent: 09/12/2016  12:23 PM  To: Harley HallmarkSoha Mousa, MD

## 2016-09-14 NOTE — Telephone Encounter (Signed)
Molly [name] is having a total knee replacement soon.  Please Have herDiscontinueAll NSAIDs and Plaquenil One week prior to surgery, and the week of surgeryAnd the weeks following surgery. She may restart bothAfter surgery as long as there aren't any contraindications.-per Dr Sullivan LoneMousa        Pt was called and informed She says she will see us before her appt

## 2016-09-14 NOTE — Progress Notes (Signed)
Molly [name] is having a total knee replacement soon.  Please Have herDiscontinueAll NSAIDs and Plaquenil One week prior to surgery, and the week of surgeryAnd the weeks following surgery. She may restart bothAfter surgery as long as there aren't any contraindications.

## 2016-09-20 LAB — CBC WITH AUTO DIFFERENTIAL
Basophils %: 0.4 %
Basophils Absolute: 0 10*3/uL (ref 0.0–0.2)
Eosinophils %: 3.1 %
Eosinophils Absolute: 0.2 10*3/uL (ref 0.0–0.6)
Hematocrit: 43.2 % (ref 36.0–48.0)
Hemoglobin: 14.4 g/dL (ref 12.0–16.0)
Lymphocytes %: 26.4 %
Lymphocytes Absolute: 1.4 10*3/uL (ref 1.0–5.1)
MCH: 31.9 pg (ref 26.0–34.0)
MCHC: 33.4 g/dL (ref 31.0–36.0)
MCV: 95.6 fL (ref 80.0–100.0)
MPV: 8 fL (ref 5.0–10.5)
Monocytes %: 14.4 %
Monocytes Absolute: 0.8 10*3/uL (ref 0.0–1.3)
Neutrophils %: 55.7 %
Neutrophils Absolute: 3.1 10*3/uL (ref 1.7–7.7)
Platelets: 319 10*3/uL (ref 135–450)
RBC: 4.51 M/uL (ref 4.00–5.20)
RDW: 12.6 % (ref 12.4–15.4)
WBC: 5.5 10*3/uL (ref 4.0–11.0)

## 2016-09-20 LAB — URINALYSIS
Bilirubin Urine: NEGATIVE
Blood, Urine: NEGATIVE
Glucose, Ur: NEGATIVE mg/dL
Ketones, Urine: NEGATIVE mg/dL
Nitrite, Urine: NEGATIVE
Protein, UA: NEGATIVE mg/dL
Specific Gravity, UA: 1.01 (ref 1.005–1.030)
Urobilinogen, Urine: 0.2 E.U./dL (ref <2.0)
pH, UA: 7 (ref 5.0–8.0)

## 2016-09-20 LAB — BASIC METABOLIC PANEL
Anion Gap: 15 (ref 3–16)
BUN: 12 mg/dL (ref 7–20)
CO2: 25 mmol/L (ref 21–32)
Calcium: 9.6 mg/dL (ref 8.3–10.6)
Chloride: 97 mmol/L — ABNORMAL LOW (ref 99–110)
Creatinine: 0.7 mg/dL (ref 0.6–1.1)
GFR African American: >60 (ref >60)
GFR Non-African American: >60 (ref >60)
Glucose: 83 mg/dL (ref 70–99)
Potassium: 4.2 mmol/L (ref 3.5–5.1)
Sodium: 137 mmol/L (ref 136–145)

## 2016-09-20 LAB — MICROSCOPIC URINALYSIS: RBC, UA: NONE SEEN /HPF (ref 0–2)

## 2016-09-20 LAB — APTT: aPTT: 30.9 s (ref 24.1–34.9)

## 2016-09-20 LAB — TRANSFERRIN: Transferrin: 279 mg/dL (ref 200.0–360.0)

## 2016-09-20 LAB — ALBUMIN: Albumin: 4.6 g/dL (ref 3.4–5.0)

## 2016-09-20 LAB — PROTIME-INR
INR: 1.07 (ref 0.85–1.15)
Protime: 12.1 s (ref 9.6–13.0)

## 2016-09-20 NOTE — Other (Signed)
[facility], [place]  [address], [address]  [place], [place]  [address]  Phone: [phone]  Fax [phone]    Physical Therapy Daily Treatment Note  Date:  09/20/2016    Patient Name:  Molly [name]    DOB:  1961/02/24  MRN: [id number]  Restrictions/Precautions:    Medical/Treatment Diagnosis Information:   Diagnosis: M25.562 (ICD-10-CM) - Left knee pain, unspecified chronicity, M17.12 (ICD-10-CM) - Primary osteoarthritis of left knee   Treatment Diagnosis: M25.562 (ICD-10-CM) - Left knee pain, unspecified chronicity  Insurance/Certification information:  PT Insurance Information: MED MUT - $50/25 - $1000DED - $0CP - PT/OT MED NEC -90/10% - NO AUTH  Physician Information:  Referring Practitioner: Tami Ribas  Plan of care signed (Y/N):     Date of Patient follow up with Physician:     G-Code (if applicable):      Date G-Code Applied:    PT G-Codes  Functional Assessment Tool Used: LEFS  Score: 64%  Functional Limitation: Mobility: Walking and moving around  Mobility: Walking and Moving Around Current Status [phone]): At least 60 percent but less than 80 percent impaired, limited or restricted  Mobility: Walking and Moving Around Goal Status [phone]): At least 60 percent but less than 80 percent impaired, limited or restricted    Progress Note: [x]   Yes  []   No  Next due by: Visit #10       Latex Allergy:  [x] NO      [] YES  Preferred Language for Healthcare:   [x] English       [] other:    Visit # Insurance Allowable   1 BMN     Pain level:  7/10     SUBJECTIVE:  See eval    OBJECTIVE: See eval  Observation:   Test measurements:      RESTRICTIONS/PRECAUTIONS: Hx Non-Hodgkin's, Lupus    Exercises/Interventions:     Therapeutic Ex Sets/reps Notes        Seated HS stretch 3x30 sec HEP   Seated calf stretch 3x30 sec HEP   Seated QS BLEs 10 sec 10x HEP   SLR FLX supine 1x10 HEP   Seated heelslide with strap 10 sec 10x HEP   LAQ  1x10 HEP   minisquats 1x10 HEP                                       Pt ed: HEP, activity mod, post-op expectations and progression x6'    Manual Intervention                                   NMR re-education                                                      Therapeutic Exercise and NMR EXR  [x]  (97110) Provided verbal/tactile cueing for activities related to strengthening, flexibility, endurance, ROM for improvements in LE, proximal hip, and core control with self care, mobility, lifting, ambulation.  []  [id number]) Provided verbal/tactile cueing for activities related to improving balance, coordination, kinesthetic sense, posture, motor skill, proprioception  to assist with LE, proximal hip, and core control in self care, mobility,  lifting, ambulation and eccentric single leg control.     NMR and Therapeutic Activities:    []  [phone] or [id number]) Provided verbal/tactile cueing for activities related to improving balance, coordination, kinesthetic sense, posture, motor skill, proprioception and motor activation to allow for proper function of core, proximal hip and LE with self care and ADLs  []  (99371) Gait Re-education- Provided training and instruction to the patient for proper LE, core and proximal hip recruitment and positioning and eccentric body weight control with ambulation re-education including up and down stairs     Home Exercise Program:    [x]  (97110) Reviewed/Progressed HEP activities related to strengthening, flexibility, endurance, ROM of core, proximal hip and LE for functional self-care, mobility, lifting and ambulation/stair navigation   []  (97112)Reviewed/Progressed HEP activities related to improving balance, coordination, kinesthetic sense, posture, motor skill, proprioception of core, proximal hip and LE for self care, mobility, lifting, and ambulation/stair navigation      Manual Treatments:  PROM / STM / Oscillations-Mobs:  G-I, II, III, IV (PA's, Inf., Post.)  []  (97140) Provided manual therapy to mobilize LE, proximal hip and/or LS spine  soft tissue/joints for the purpose of modulating pain, promoting relaxation,  increasing ROM, reducing/eliminating soft tissue swelling/inflammation/restriction, improving soft tissue extensibility and allowing for proper ROM for normal function with self care, mobility, lifting and ambulation.     Modalities:      Charges:  Timed Code Treatment Minutes: 20   Total Treatment Minutes: 45     [x]  EVAL (LOW) 97161 (typically 20 minutes face-to-face)  []  EVAL (MOD) 69678 (typically 30 minutes face-to-face)  []  EVAL (HIGH) 97163 (typically 45 minutes face-to-face)  []  RE-EVAL     [x]  LF(81017) x  1   []  IONTO  []  NMR (97112) x      []  VASO  []  Manual (97140) x       []  Other:  []  TA x       []  Mech Traction (51025)  []  ES(attended) (85277)      []  ES (un) (82423):     GOALS:   Patient stated goal: To be prepared for surgery    Therapist goals for Patient:   Short Term Goals: To be achieved in: 1 weeks  1. Independent in HEP and progression per patient tolerance, in order to prevent re-injury and to prepare for surgery by strength and flexibility therex .     Progression Towards Functional goals:  []  Patient is progressing as expected towards functional goals listed.    []  Progression is slowed due to complexities listed.  []  Progression has been slowed due to co-morbidities.  [x]  Plan just implemented, too soon to assess goals progression  []  Other:     ASSESSMENT:  See eval    Treatment/Activity Tolerance:  [x]  Patient tolerated treatment well []  Patient limited by fatique  []  Patient limited by pain  []  Patient limited by other medical complications  []  Other:     Prognosis: [x]  Good []  Fair  []  Poor          Patient Requires Follow-up: []  Yes  [x]  No    PLAN: See eval       []  Continue per plan of care []  Alter current plan (see comments)  []  Plan of care initiated [x]  Discharge     Electronically signed by: Orma Render, PT

## 2016-09-20 NOTE — Plan of Care (Signed)
[facility] [place]  [address]  [place], [place]  [address]  Phone: [phone]  Fax [phone]     Physical Therapy Certification    Dear Referring Practitioner: Tami Ribas,    We had the pleasure of evaluating the following patient for physical therapy services at [facility].  A summary of our findings can be found in the initial assessment below.  This includes our plan of care.  If you have any questions or concerns regarding these findings, please do not hesitate to contact me at the office phone number checked above.  Thank you for the referral.       Physician Signature:_______________________________Date:__________________  By signing above (or electronic signature), therapist???s plan is approved by physician    Patient: Molly [name]   DOB: May 01, 1961   MRN: [id number]  Referring Physician: Referring Practitioner: Tami Ribas      Evaluation Date: 09/20/2016      Medical Diagnosis Information:  Diagnosis: M25.562 (ICD-10-CM) - Left knee pain, unspecified chronicity, M17.12 (ICD-10-CM) - Primary osteoarthritis of left knee   Treatment Diagnosis: M25.562 (ICD-10-CM) - Left knee pain, unspecified chronicity                                         Insurance information: PT Insurance Information: MED MUT - $50/25 - $1000DED - $0CP - PT/OT MED NEC -90/10% - NO AUTH     Precautions/ Contra-indications: none  Latex Allergy:  [x] NO      [] YES  Preferred Language for Healthcare:   [x] English       [] other:    SUBJECTIVE: Patient stated complaint:Pt reports progressive worsening of L knee pain that limits her walking and daily activities causing her to seek out TKR.  She is here seeking education and exercises to help prepare her for surgery.    Relevant Medical History:non-Hodgkins Lymphoma, OA, Lupus  Functional Disability Index:PT G-Codes  Functional Assessment Tool Used: LEFS  Score: 64%  Functional Limitation: Mobility: Walking  and moving around  Mobility: Walking and Moving Around Current Status (I9518): At least 60 percent but less than 80 percent impaired, limited or restricted  Mobility: Walking and Moving Around Goal Status [phone]): At least 60 percent but less than 80 percent impaired, limited or restricted    Pain Scale: 7/10  Easing factors: rest  Provocative factors: standing, walking, stairs, squatting, kneeling, position changes     Type: [x] Constant   [] Intermittent  [] Radiating [] Localized [] other:     Numbness/Tingling: none    Occupation/School:restuarant owner/operator    Living Status/Prior Level of Function: Independent with ADLs and IADLs,  Lives with husband, stairs in the home, likes to walk her dogs, trying to lose weight.    OBJECTIVE:         Joint mobility: NT   [] Normal    [] Hypo   [] Hyper    Palpation: NT    Functional Mobility/Transfers: indep with pain    Posture: L knee  Valgus deformity, decreased L WBing    Bandages/Dressings/Incisions: NA    Gait: (include devices/WB status) antalgic L  With increased distance, also L post hip/thigh pain reported    Orthopedic Special Tests: see above                       [x]  Patient history, allergies, meds reviewed. Medical chart  reviewed. See intake form.     Review Of Systems (ROS):  [x] Performed Review of systems (Integumentary, CardioPulmonary, Neurological) by intake and observation. Intake form has been scanned into medical record. Patient has been instructed to contact their primary care physician regarding ROS issues if not already being addressed at this time.      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)   Cardiovascular conditions   [] Hypertension (I10)  [] Hyperlipidemia (E78.5)  [] Angina pectoris (I20)  [] Atherosclerosis (I70)   Musculoskeletal conditions   [] Disc pathology   [] Congenital spine pathologies   [] Prior surgical  intervention  [] Osteoporosis (M81.8)  [] Osteopenia (M85.8)   Endocrine conditions   [] Hypothyroid (E03.9)  [] Hyperthyroid Gastrointestinal conditions   [] Constipation (K59.00)   Metabolic conditions   [] Morbid obesity (E66.01)  [] Diabetes type 1(E10.65) or 2 (E11.65)   [] Neuropathy (G60.9)     Pulmonary conditions   [] Asthma (J45)  [] Coughing   [] COPD (J44.9)   Psychological Disorders  [] Anxiety (F41.9)  [] Depression (F32.9)   [] Other:   [x] Other:  History non-Hodgkins, Lupus        Barriers to/and or personal factors that will affect rehab potential:              [] Age  [] Sex              [] Motivation/Lack of Motivation                        [] Co-Morbidities              [] Cognitive Function, education/learning barriers              [] Environmental, home barriers              [] profession/work barriers  [] past PT/medical experience  [] other:  Justification:     Falls Risk Assessment (30 days):   [x]  Falls Risk assessed and no intervention required.  []  Falls Risk assessed and Patient requires intervention due to being higher risk   TUG score (>12s at risk):     []  Falls education provided, including       G-Codes:  PT G-Codes  Functional Assessment Tool Used: LEFS  Score: 64%  Functional Limitation: Mobility: Walking and moving around  Mobility: Walking and Moving Around Current Status (Z6109(G8978): At least 60 percent but less than 80 percent impaired, limited or restricted  Mobility: Walking and Moving Around Goal Status [phone]): At least 60 percent but less than 80 percent impaired, limited or restricted    ASSESSMENT:   Functional Impairments:     [] Noted lumbar/proximal hip/LE joint hypomobility   [x] Decreased LE functional ROM   [x] Decreased core/proximal hip strength and neuromuscular control   [x] Decreased LE functional strength   [x] Reduced balance/proprioceptive control   [] other:      Functional Activity Limitations (from functional questionnaire and intake)   [x] Reduced ability to tolerate prolonged  functional positions   [x] Reduced ability or difficulty with changes of positions or transfers between positions   [] Reduced ability to maintain good posture and demonstrate good body mechanics with sitting, bending, and lifting   [] Reduced ability to sleep   [x]  Reduced ability or tolerance with driving and/or computer work   [x] Reduced ability to perform lifting, carrying tasks   [x] Reduced ability to squat   [] Reduced ability to forward bend   [x] Reduced ability to ambulate prolonged  functional periods/distances/surfaces   [x] Reduced ability to ascend/descend stairs   [x] Reduced ability to run, hop, cut or jump   [] other:    Participation Restrictions   [] Reduced participation in self care activities   [] Reduced participation in home management activities   [x] Reduced participation in work activities   [x] Reduced participation in social activities.   [] Reduced participation in sport/recreation activities.    Classification :    [] Signs/symptoms consistent with post-surgical status including decreased ROM, strength and function.   [] Signs/symptoms consistent with joint sprain/strain   [] Signs/symptoms consistent with patella-femoral syndrome   [x] Signs/symptoms consistent with knee OA/hip OA   [] Signs/symptoms consistent with internal derangement of knee/Hip   [] Signs/symptoms consistent with functional hip weakness/NMR control      [] Signs/symptoms consistent with tendinitis/tendinosis    [] signs/symptoms consistent with pathology which may benefit from Dry needling      [] other:      Prognosis/Rehab Potential:      [] Excellent   [x] Good    [] Fair   [] Poor    Tolerance of evaluation/treatment:    [] Excellent   [x] Good    [] Fair   [] Poor  Physical Therapy Evaluation Complexity Justification  [x]  A history of present problem with:  []  no personal factors and/or comorbidities that impact the plan of care;  [] 1-2 personal factors and/or comorbidities that impact the plan of care  [x] 3 personal factors and/or  comorbidities that impact the plan of care  [x]  An examination of body systems using standardized tests and measures addressing any of the following: body structures and functions (impairments), activity limitations, and/or participation restrictions;:  [x]  a total of 1-2 or more elements   []  a total of 3 or more elements   []  a total of 4 or more elements   [x]  A clinical presentation with:  [x]  stable and/or uncomplicated characteristics   []  evolving clinical presentation with changing characteristics  []  unstable and unpredictable characteristics;   [x]  Clinical decision making of [x]  low, []  moderate, []  high complexity using standardized patient assessment instrument and/or measurable assessment of functional outcome.    [x]  EVAL (LOW) 97161 (typically 20 minutes face-to-face)  []  EVAL (MOD) 16109 (typically 30 minutes face-to-face)  []  EVAL (HIGH) 97163 (typically 45 minutes face-to-face)  []  RE-EVAL       PLAN:   Frequency/Duration:  1 days per week for 1 Weeks:  Interventions:  [x]   Therapeutic exercise including: strength training, ROM, for Lower extremity and core   [x]   NMR activation and proprioception for LE, Glutes and Core   [x]   Manual therapy as indicated for LE, Hip and spine to include: Dry Needling/IASTM, STM, PROM, Gr I-IV mobilizations, manipulation.   [x]  Modalities as needed that may include: thermal agents, E-stim, Biofeedback, [place], iontophoresis as indicated  [x]  Patient education on joint protection, postural re-education, activity modification, progression of HEP.    HEP instruction: yes(see scanned forms)    GOALS:  Patient stated goal: be ready for surgery    Therapist goals for Patient:   Short Term Goals: To be achieved in: 2 weeks  1. Independent in HEP and progression per patient tolerance, in order to prevent re-injury.   2. Patient will have a decrease in pain to facilitate improvement in movement, function, and ADLs as indicated by Functional Deficits.    Electronically signed  by:  Orma Render, PT

## 2016-09-21 LAB — HEMOGLOBIN A1C
Hemoglobin A1C: 5.4 %
eAG: 108.3 mg/dL

## 2016-09-21 LAB — CULTURE, URINE: Urine Culture, Routine: <50000

## 2016-09-22 ENCOUNTER — Encounter

## 2016-09-29 NOTE — Telephone Encounter (Signed)
Left voicemail for pt.   Molly [name]  Orthopedic Nurse Navigator  Phone number: [phone]

## 2016-09-30 NOTE — Telephone Encounter (Signed)
Left voicemail for pt.   Molly [name]  Orthopedic Nurse Navigator  Phone number: [phone]

## 2016-10-03 NOTE — Telephone Encounter (Signed)
Patient scheduled for surgery 10/25/16, nurse navigator called patient to make sure visit with primary care physician for pre-op visit was scheduled and pre-op lab work was scheduled.  Discussed PAT class every Tuesday at [place] at [facility].      PCP Scheduled: To schedule    Specialist: Cardiologist/ Hemoc/ Vascular: NONE    Prehab scheduled: Completed    CT scan: 10/11/16     Lab work schedule: Completed    Discharge plans: Home with outpt PT at [place] will care for patient after discharge: Husband- physician, sister and sister in law- Catering manager patient has at home: Environmental consultant    [employer]: Has kit, is registered, and has no questions    Instructed pt a pre admission nurse would call to review medical history and medications prior to surgery. Also instructed patient to call with any questions or concerns.    Allegra Lai  Orthopedic Nurse Navigator  Phone number [phone]

## 2016-10-11 ENCOUNTER — Encounter

## 2016-10-11 ENCOUNTER — Inpatient Hospital Stay: Admit: 2016-10-11 | Attending: Orthopaedic Surgery | Primary: Gerontology

## 2016-10-11 DIAGNOSIS — M25562 Pain in left knee: Secondary | ICD-10-CM

## 2016-10-11 MED ORDER — DULOXETINE HCL 60 MG PO CPEP
60 MG | ORAL_CAPSULE | Freq: Every day | ORAL | 3 refills | Status: DC
Start: 2016-10-11 — End: 2017-07-24

## 2016-10-11 MED ORDER — CYCLOBENZAPRINE HCL 5 MG PO TABS
5 MG | ORAL_TABLET | ORAL | 5 refills | Status: DC
Start: 2016-10-11 — End: 2017-12-11

## 2016-10-11 MED ORDER — HYDROXYCHLOROQUINE SULFATE 200 MG PO TABS
200 MG | ORAL_TABLET | Freq: Two times a day (BID) | ORAL | 3 refills | Status: DC
Start: 2016-10-11 — End: 2017-07-24

## 2016-10-11 NOTE — Telephone Encounter (Signed)
From: [name]  Sent: 10/10/2016 10:05 PM EST  Subject: Medication Renewal Request    Molly [name] would like a refill of the following medications:  cyclobenzaprine (FLEXERIL) 5 MG tablet Harley Hallmark[Soha Mousa, MD]  DULoxetine (CYMBALTA) 60 MG extended release capsule Harley Hallmark[Soha Mousa, MD]  hydroxychloroquine (PLAQUENIL) 200 MG tablet Harley Hallmark[Soha Mousa, MD]    [name]     Comment:

## 2016-10-12 MED ORDER — MUPIROCIN 2 % EX OINT
2 % | CUTANEOUS | 0 refills | Status: DC
Start: 2016-10-12 — End: 2016-10-26

## 2016-10-12 NOTE — Telephone Encounter (Signed)
[id number]  [id number]   [facility] TO PRECERT

## 2016-10-12 NOTE — Telephone Encounter (Signed)
Error

## 2016-10-13 NOTE — Patient Instructions (Signed)
1. Do not eat or drink anything after 12 midnight prior to surgery. This includes no water, chewing gum mints, or ice chips. You may brush your teeth and gargle the day of surgery but DO NOT SWALLOW THE WATER.   2. Please see your family doctor/pediatrician for a history and physical and/or concerning medications. Bring any test results/reports from your physician's office. If you are under the care of a heart doctor or specialist please be aware that you may be asked to see him or her for clearance.   3. You may be asked to stop blood thinners such as Coumadin, Plavix, Fragmin, and Lovenox or Anti-inflammatories such as Aspirin, Ibuprofen, Advil, and Naproxen prior to your surgery. Please check with your doctor before stopping these or any other medications.   4. Do not smoke, and do not drink any alcoholic beverages 24 hours prior to surgery.    5. You MUST make arrangements for a responsible adult to take you home after your surgery. For your safety, you will not be allowed to leave alone or drive yourself home. Your surgery will be cancelled if you do not have a ride home.Also for your safety, it is strongly suggested someone stay with you the first 24 hrs after your surgery.   6. A parent/legal guardian must accompany a child scheduled for surgery and plan to stay at the hospital until the child is discharged.  Please do not bring other children with you.   7. For your comfort,please wear simple, loose fitting clothing to the hospital.  Please do not bring valuables (money, credit cards, checkbooks, etc.) Do not wear any makeup (including no eye makeup) or nail polish on your fingers or toes.   8. For your safety, please DO NOT wear any jewelry or piercings on day of surgery.  All body piercing jewelry must be removed.   9. If you have dentures, they will be removed before going to the OR; for your convenience we will provide you with a container.  If you wear contact lenses or glasses, they will be removed,  they will be removed, please bring a case for them.   10. If appicable,Please see your family doctor/pediatrician for a history & physical and/or concerning medications.  Bring any test results/reports from your physician's office.   11. Remember to bring Blood Bank bracelet to the hospital on the day of surgery.   12. If you have a Living Will and Durable Power of Attorney for Healthcare, please bring in a copy.   13. Notify your Surgeon if you develop any illness between now and surgery  time, cough, cold, fever, sore throat, nausea, vomiting, etc.  Please notify your surgeon if you experience dizziness, shortness of breath or blurred vision between now & the time of your surgery   14. DO NOT shave your operative site 96 hours prior to surgery. For face & neck surgery, men may use an electric razor 48 hours prior to surgery.   15. Shower the night before surgery with _x__Antibacterial soap _x__Hibiclens   16. To provide excellent care visitors will be limited to one in the room at any given time.               17.  Please bring picture ID and insurance card.    18.  Visit our web site for additional information:  [email or web address].

## 2016-10-13 NOTE — Patient Instructions (Signed)
Obstructive Sleep Apnea (OSA) Screening     Patient:  Molly [name], [name]    Date of Birth: 10-19-1960      Medical Record #:  [id number]                     Date:  10/13/2016     1.  Are you a loud and/or regular snorer?     []   Yes       [x]  No    2.  Have you been observed to gasp or stop breathing during sleep?     []   Yes       [x]  No    3.  Do you feel tired or groggy upon awakening or do you awaken with a headache?           []   Yes       [x]  No    4.  Are you often tired or fatigued during the wake time hours?     []   Yes       [x]  No    5.  Do you fall asleep sitting, reading, watching TV or driving?     []   Yes       [x]  No    6.  Do you often have problems with memory or concentration?     []   Yes       [x]  No    **If patient's score is ?3 they are considered high risk for OSA.  Notify the anesthesiologist of the high risk and document in focus note.    Note:  If the patient's BMI is more than 35 kg m???? , has neck circumference > 40 cm, and/or high blood pressure the risk is greater (?? American Sleep Apnea Association, 2006).

## 2016-10-17 ENCOUNTER — Ambulatory Visit: Admit: 2016-10-17 | Discharge: 2016-10-17 | Payer: PRIVATE HEALTH INSURANCE | Attending: Family | Primary: Gerontology

## 2016-10-17 DIAGNOSIS — M1712 Unilateral primary osteoarthritis, left knee: Secondary | ICD-10-CM

## 2016-10-17 NOTE — Progress Notes (Signed)
Preoperative Consultation      Molly [name]  Date of Birth:  [birth year]    Date of Service:  10/17/2016    Vitals:    10/17/16 0825   BP: 128/70   Site: Right Arm   Position: Sitting   Cuff Size: Large Adult   Pulse: 102   Temp: 98 ??F (36.7 ??C)   TempSrc: Oral   SpO2: 98%   Weight: 295 lb (133.8 kg)   Height: 5\' 11"  (1.803 m)      Wt Readings from Last 2 Encounters:   10/17/16 295 lb (133.8 kg)   10/13/16 285 lb (129.3 kg)     BP Readings from Last 3 Encounters:   10/17/16 128/70   09/12/16 (!) 137/96   06/29/16 (!) 152/90        Chief Complaint   Patient presents with   ??? Pre-op Exam     L TKR , dos3-01-2017/Dr. Mitchel Honour / [phone]     Allergies   Allergen Reactions   ??? Sulfa Antibiotics Rash     Outpatient Prescriptions Marked as Taking for the 10/17/16 encounter (Office Visit) with Eliott Nine, CNP   Medication Sig Dispense Refill   ??? magnesium citrate solution Take 296 mLs by mouth once daily     ??? mupirocin (BACTROBAN) 2 % ointment 1 22 gram tube. Apply 1 cm (small drop) to a q-tip and swab the inside of each nostril twice a day starting 5 days prior to surgery. 1 Tube 0   ??? cyclobenzaprine (FLEXERIL) 5 MG tablet Take 1???2 tablets nightly as needed 25 tablet 5   ??? DULoxetine (CYMBALTA) 60 MG extended release capsule Take 1 capsule by mouth daily (Patient taking differently: Take 60 mg by mouth nightly ) 90 capsule 3   ??? hydroxychloroquine (PLAQUENIL) 200 MG tablet Take 1 tablet by mouth 2 times daily (Patient taking differently: Take 400 mg by mouth nightly ) 180 tablet 3   ??? vitamin D (CHOLECALCIFEROL) 1000 UNIT TABS tablet Take 1,000 Units by mouth daily Twice a day     ??? acetaminophen (TYLENOL) 650 MG CR tablet Take 650 mg by mouth every 8 hours as needed for Pain         This patient presents to the office today for a preoperative consultation at the request of surgeon, Dr. Tami Ribas, who plans on performing Lt TKR on March 6 at [facility].  The current problem began 3 months ago, and  symptoms have been worsening with time.  Conservative therapy: Yes: Injections, Tylenol, which has been not very effective..    Planned anesthesia: General   Known anesthesia problems: Post Anesthesia nausea   Bleeding risk: No recent or remote history of abnormal bleeding  Personal or FH of DVT/PE: No    Patient objection to receiving blood products: No    Patient Active Problem List   Diagnosis   ??? H/O vitamin D deficiency   ??? History of non-Hodgkin's lymphoma   ??? Impaired glucose tolerance   ??? HLD (hyperlipidemia)   ??? Vitamin D deficiency   ??? S/P left ACL reconstruction 1991 by Dr. Henrene Hawking   ??? Chondromalacia patellae of left knee   ??? Tear of lateral meniscus of left knee   ??? Tear of medial meniscus of left knee   ??? Left knee DJD   ??? Morbid obesity with BMI of 40.0-44.9, adult (HCC)   ??? Acquired valgus deformity knee   ??? Knee effusion   ???  Systemic lupus erythematosus (HCC)   ??? Primary osteoarthritis of one knee   ??? Primary osteoarthritis of left knee   ??? High risk medication use   ??? Primary osteoarthritis of left knee   ??? Left knee pain       Past Medical History:   Diagnosis Date   ??? Amenorrhea    ??? Cancer (HCC)    ??? History of non-Hodgkin's lymphoma 2006    treated with radiation and chemo   ??? Obesity    ??? Systemic lupus     see Dr [name]     Past Surgical History:   Procedure Laterality Date   ??? KNEE SURGERY Left     ACL and meniscus repair   ??? LEEP      in 1990's     Family History   Problem Relation Age of Onset   ??? Dementia Mother    ??? Thyroid Disease Mother    ??? Other Father      paraplegic after farming accident   ??? High Cholesterol Sister    ??? Cancer Sister [id number]     Breast CA   ??? Thyroid Disease Sister    ??? High Cholesterol Brother    ??? High Cholesterol Brother    ??? Rheum Arthritis Neg Hx    ??? Osteoarthritis Neg Hx    ??? Asthma Neg Hx    ??? Breast Cancer Neg Hx    ??? Diabetes Neg Hx    ??? Heart Failure Neg Hx    ??? Hypertension Neg Hx    ??? Migraines Neg Hx    ??? Ovarian Cancer Neg Hx    ??? Rashes/Skin Problems Neg Hx     ??? Seizures Neg Hx    ??? Stroke Neg Hx      Social History     Social History   ??? Marital status: Married     Spouse name: N/A   ??? Number of children: N/A   ??? Years of education: N/A     Occupational History   ??? Not on file.     Social History Main Topics   ??? Smoking status: Former Smoker     Packs/day: 0.50     Years: 18.00     Types: Cigarettes   ??? Smokeless tobacco: Never Used      Comment: quit 8 years ago   ??? Alcohol use 0.6 oz/week     1 Shots of liquor per week      Comment: 2x/week   ??? Drug use: No   ??? Sexual activity: Not Currently     Other Topics Concern   ??? Not on file     Social History Narrative   ??? No narrative on file       Review of Systems  All other systems were reviewed and are negative.      Physical Exam   Constitutional: She is oriented to person, place, and time. She appears well-developed and well-nourished. No distress. Obese  HENT:   Head: Normocephalic and atraumatic.   Mouth/Throat: Uvula is midline, oropharynx is clear and moist and mucous membranes are normal. Cap on Front upper.   Eyes: Conjunctivae and EOM are normal. Pupils are equal, round, and reactive to light.   Neck: Trachea normal and normal range of motion. Neck supple. No JVD present. Carotid bruit is not present. No mass and no thyromegaly present.   Cardiovascular: Normal rate, regular rhythm, normal heart sounds and intact distal pulses.  Exam  reveals no gallop and no friction rub.  No murmur heard.  Pulmonary/Chest: Effort normal and breath sounds normal. No respiratory distress. She has no wheezes. She has no rales.   Abdominal: Soft. Normal aorta and bowel sounds are normal. She exhibits no distension and no mass. There is no hepatosplenomegaly. No tenderness.   Musculoskeletal: She exhibits no edema and no tenderness.   Neurological: She is alert and oriented to person, place, and time. She has normal strength. No cranial nerve deficit or sensory deficit. Coordination and gait normal.   Skin: Skin is warm and dry. No  rash noted. No erythema.   Psychiatric: She has a normal mood and affect. Her behavior is normal.     EKG Interpretation:  N/A.    Lab Review Dr. Tami Ribas performed testing 09/20/16        Assessment:       56 y.o. patient with planned surgery as above.    Known risk factors for perioperative complications: Systemic lupus  Current medications which may produce withdrawal symptoms if withheld perioperatively: none     1. Localized osteoarthritis of left knee  Cleared for surgery    2. Pre-op examination  Cleared for surgery    3. Other systemic lupus erythematosus with other organ involvement (HCC)  F/u Mousa       Plan:     1. Preoperative workup as follows: Lab testing performed by [facility]  2. Change in medication regimen before surgery: Hold all medications on morning of surgery, Discontinue NSAIDs (Ibuprofen/Motrin/Advil/Aleve) 7 days before surgery, Discontinue Vit D 5 days before surgery, Ok to take Tylenol prior to surgery.  3. Prophylaxis for cardiac events with perioperative beta-blockers: Not indicated  ACC/AHA indications for pre-operative beta-blocker use:    ?? Vascular surgery with history of postitive stress test  ?? Intermediate or high risk surgery with history of CAD   ?? Intermediate or high risk surgery with multiple clinical predictors of CAD- 2 of the following: history of compensated or prior heart failure, history of cerebrovascular disease, DM, or renal insufficiency    Routine administration of higher-dose, long-acting metoprolol in beta-blocker???na??ve patients on the day of surgery, and in the absence of dose titration is associated with an overall increase in mortality.  Beta-blockers should be started days to weeks prior to surgery and titrated to pulse < 70.  4. Deep vein thrombosis prophylaxis: regimen to be chosen by surgical team  5. No contraindications to planned surgery            Cleared for surgery.

## 2016-10-24 MED ORDER — BUPIVACAINE LIPOSOME 1.3 % IJ SUSP
1.3 % | INTRAMUSCULAR | Status: AC
Start: 2016-10-24 — End: 2016-10-25
  Administered 2016-10-25: 18:00:00 266 mL via SUBCUTANEOUS

## 2016-10-24 MED FILL — EXPAREL 1.3 % IJ SUSP: 1.3 % | INTRAMUSCULAR | Qty: 20

## 2016-10-25 ENCOUNTER — Encounter: Attending: Rheumatology | Primary: Gerontology

## 2016-10-25 ENCOUNTER — Inpatient Hospital Stay
Admit: 2016-10-25 | Discharge: 2016-10-26 | Disposition: A | Discharge status: Home / Self Care | Source: Ambulatory Visit | Attending: Orthopaedic Surgery | Admitting: Orthopaedic Surgery

## 2016-10-25 DIAGNOSIS — Z96652 Presence of left artificial knee joint: Secondary | ICD-10-CM

## 2016-10-25 LAB — APTT: aPTT: 29.1 s (ref 24.1–34.9)

## 2016-10-25 LAB — POCT GLUCOSE: POC Glucose: 69 mg/dl — ABNORMAL LOW (ref 70–99)

## 2016-10-25 LAB — PROTIME-INR
INR: 1.04 (ref 0.85–1.15)
Protime: 11.8 s (ref 9.6–13.0)

## 2016-10-25 LAB — TYPE AND SCREEN
ABO/Rh: O POS
Antibody Screen: NEGATIVE

## 2016-10-25 MED ORDER — LACTATED RINGERS IV SOLN
INTRAVENOUS | Status: DC
Start: 2016-10-25 — End: 2016-10-26
  Administered 2016-10-25 – 2016-10-26 (×2): via INTRAVENOUS

## 2016-10-25 MED ORDER — LIDOCAINE HCL 1 % (PF) IJ SOLN 2 ML
1 % (PF) | Freq: Once | INTRAMUSCULAR | Status: DC | PRN
Start: 2016-10-25 — End: 2016-10-25

## 2016-10-25 MED ORDER — DULOXETINE HCL 60 MG PO CPEP
60 MG | Freq: Every evening | ORAL | Status: DC
Start: 2016-10-25 — End: 2016-10-26
  Administered 2016-10-26: 02:00:00 60 mg via ORAL

## 2016-10-25 MED ORDER — CYCLOBENZAPRINE HCL 10 MG PO TABS
10 MG | Freq: Three times a day (TID) | ORAL | Status: DC | PRN
Start: 2016-10-25 — End: 2016-10-26
  Administered 2016-10-25 – 2016-10-26 (×2): 10 mg via ORAL

## 2016-10-25 MED ORDER — OXYCODONE-ACETAMINOPHEN 5-325 MG PO TABS
5-325 MG | ORAL | Status: DC | PRN
Start: 2016-10-25 — End: 2016-10-25

## 2016-10-25 MED ORDER — OXYCODONE-ACETAMINOPHEN 5-325 MG PO TABS
5-325 MG | ORAL | Status: DC | PRN
Start: 2016-10-25 — End: 2016-10-26

## 2016-10-25 MED ORDER — CYCLOBENZAPRINE HCL 10 MG PO TABS
10 MG | Freq: Once | ORAL | Status: AC
Start: 2016-10-25 — End: 2016-10-25
  Administered 2016-10-25: 16:00:00 10 mg via ORAL

## 2016-10-25 MED ORDER — OXYCODONE-ACETAMINOPHEN 5-325 MG PO TABS
5-325 MG | ORAL | Status: DC | PRN
Start: 2016-10-25 — End: 2016-10-26
  Administered 2016-10-25 – 2016-10-26 (×5): 2 via ORAL

## 2016-10-25 MED ORDER — INSULIN LISPRO 100 UNIT/ML SC SOLN
100 UNIT/ML | Freq: Three times a day (TID) | SUBCUTANEOUS | Status: DC
Start: 2016-10-25 — End: 2016-10-26

## 2016-10-25 MED ORDER — LABETALOL HCL 5 MG/ML IV SOLN
5 MG/ML | INTRAVENOUS | Status: DC | PRN
Start: 2016-10-25 — End: 2016-10-25

## 2016-10-25 MED ORDER — HYDROMORPHONE HCL 1 MG/ML IJ SOLN
1 MG/ML | INTRAMUSCULAR | Status: AC | PRN
Start: 2016-10-25 — End: 2016-10-25
  Administered 2016-10-25 (×4): 0.5 mg via INTRAVENOUS

## 2016-10-25 MED ORDER — GLUCOSE 40 % PO GEL
40 % | ORAL | Status: DC | PRN
Start: 2016-10-25 — End: 2016-10-26

## 2016-10-25 MED ORDER — GLUCAGON HCL RDNA (DIAGNOSTIC) 1 MG IJ SOLR
1 MG | INTRAMUSCULAR | Status: DC | PRN
Start: 2016-10-25 — End: 2016-10-26

## 2016-10-25 MED ORDER — SODIUM CHLORIDE 0.9 % IV SOLN
0.9 | INTRAVENOUS | Status: AC
Start: 2016-10-25 — End: 2016-10-25

## 2016-10-25 MED ORDER — ENOXAPARIN SODIUM 30 MG/0.3ML SC SOLN
30 MG/0.3ML | Freq: Two times a day (BID) | SUBCUTANEOUS | Status: DC
Start: 2016-10-25 — End: 2016-10-26
  Administered 2016-10-26: 14:00:00 30 mg via SUBCUTANEOUS

## 2016-10-25 MED ORDER — CEFAZOLIN SODIUM 1 G IJ SOLR
1 | INTRAMUSCULAR | Status: AC
Start: 2016-10-25 — End: 2016-10-25

## 2016-10-25 MED ORDER — NORMAL SALINE FLUSH 0.9 % IV SOLN
0.9 % | Freq: Two times a day (BID) | INTRAVENOUS | Status: DC
Start: 2016-10-25 — End: 2016-10-26
  Administered 2016-10-26: 14:00:00 10 mL via INTRAVENOUS

## 2016-10-25 MED ORDER — VANCOMYCIN HCL 1000 MG IV SOLR
1000 | INTRAVENOUS | Status: AC
Start: 2016-10-25 — End: 2016-10-25

## 2016-10-25 MED ORDER — ONDANSETRON HCL 4 MG/2ML IJ SOLN
4 MG/2ML | Freq: Four times a day (QID) | INTRAMUSCULAR | Status: DC | PRN
Start: 2016-10-25 — End: 2016-10-26
  Administered 2016-10-25: 22:00:00 4 mg via INTRAVENOUS

## 2016-10-25 MED ORDER — INSULIN LISPRO 100 UNIT/ML SC SOLN
100 UNIT/ML | Freq: Every evening | SUBCUTANEOUS | Status: DC
Start: 2016-10-25 — End: 2016-10-26

## 2016-10-25 MED ORDER — MAGNESIUM HYDROXIDE 400 MG/5ML PO SUSP
400 MG/5ML | Freq: Every day | ORAL | Status: DC | PRN
Start: 2016-10-25 — End: 2016-10-26

## 2016-10-25 MED ORDER — MEPERIDINE HCL 50 MG/ML IJ SOLN
50 MG/ML | INTRAMUSCULAR | Status: DC | PRN
Start: 2016-10-25 — End: 2016-10-25

## 2016-10-25 MED ORDER — HYDROMORPHONE HCL 1 MG/ML IJ SOLN
1 MG/ML | INTRAMUSCULAR | Status: DC | PRN
Start: 2016-10-25 — End: 2016-10-26

## 2016-10-25 MED ORDER — HYDROMORPHONE HCL 1 MG/ML IJ SOLN
1 MG/ML | INTRAMUSCULAR | Status: DC | PRN
Start: 2016-10-25 — End: 2016-10-25

## 2016-10-25 MED ORDER — DEXTROSE 50 % IV SOLN
50 % | INTRAVENOUS | Status: DC | PRN
Start: 2016-10-25 — End: 2016-10-26

## 2016-10-25 MED ORDER — CEFAZOLIN 3000 MG D5W 100 ML IVPB
Freq: Three times a day (TID) | Status: AC
Start: 2016-10-25 — End: 2016-10-26
  Administered 2016-10-26 (×2): 3 g via INTRAVENOUS

## 2016-10-25 MED ORDER — LACTATED RINGERS IV SOLN
INTRAVENOUS | Status: DC
Start: 2016-10-25 — End: 2016-10-26
  Administered 2016-10-25: 16:00:00 via INTRAVENOUS

## 2016-10-25 MED ORDER — MORPHINE SULFATE 4 MG/ML IJ SOLN
4 MG/ML | INTRAMUSCULAR | Status: DC | PRN
Start: 2016-10-25 — End: 2016-10-25

## 2016-10-25 MED ORDER — CEFAZOLIN 3000 MG D5W 100 ML IVPB
Status: AC
Start: 2016-10-25 — End: 2016-10-25
  Administered 2016-10-25: 18:00:00 3 g via INTRAVENOUS

## 2016-10-25 MED ORDER — NORMAL SALINE FLUSH 0.9 % IV SOLN
0.9 % | INTRAVENOUS | Status: DC | PRN
Start: 2016-10-25 — End: 2016-10-26

## 2016-10-25 MED ORDER — DIPHENHYDRAMINE HCL 50 MG/ML IJ SOLN
50 MG/ML | Freq: Once | INTRAMUSCULAR | Status: DC | PRN
Start: 2016-10-25 — End: 2016-10-25

## 2016-10-25 MED ORDER — MORPHINE SULFATE (PF) 1 MG/ML IJ SOLN
1 MG/ML | INTRAMUSCULAR | Status: DC | PRN
Start: 2016-10-25 — End: 2016-10-25

## 2016-10-25 MED ORDER — DEXTROSE 5 % IV SOLN
5 % | INTRAVENOUS | Status: DC | PRN
Start: 2016-10-25 — End: 2016-10-26

## 2016-10-25 MED ORDER — CYCLOBENZAPRINE HCL 10 MG PO TABS
10 MG | Freq: Every evening | ORAL | Status: DC | PRN
Start: 2016-10-25 — End: 2016-10-26

## 2016-10-25 MED ORDER — STERILE WATER FOR INJECTION IJ SOLN
INTRAMUSCULAR | Status: AC
Start: 2016-10-25 — End: 2016-10-25

## 2016-10-25 MED ORDER — ONDANSETRON HCL 4 MG/2ML IJ SOLN
4 MG/2ML | Freq: Once | INTRAMUSCULAR | Status: DC | PRN
Start: 2016-10-25 — End: 2016-10-25

## 2016-10-25 MED ORDER — BUPIVACAINE HCL (PF) 0.25 % IJ SOLN
0.25 | INTRAMUSCULAR | Status: AC
Start: 2016-10-25 — End: 2016-10-25

## 2016-10-25 MED ORDER — ACETAMINOPHEN 325 MG PO TABS
325 MG | ORAL | Status: DC | PRN
Start: 2016-10-25 — End: 2016-10-26

## 2016-10-25 MED ORDER — DOCUSATE SODIUM 100 MG PO CAPS
100 MG | Freq: Two times a day (BID) | ORAL | Status: DC
Start: 2016-10-25 — End: 2016-10-26
  Administered 2016-10-26 (×2): 100 mg via ORAL

## 2016-10-25 MED ORDER — HYDRALAZINE HCL 20 MG/ML IJ SOLN
20 MG/ML | INTRAMUSCULAR | Status: DC | PRN
Start: 2016-10-25 — End: 2016-10-25

## 2016-10-25 MED FILL — ONDANSETRON HCL 4 MG/2ML IJ SOLN: 4 MG/2ML | INTRAMUSCULAR | Qty: 2

## 2016-10-25 MED FILL — VANCOMYCIN HCL 1000 MG IV SOLR: 1000 MG | INTRAVENOUS | Qty: 1000

## 2016-10-25 MED FILL — DILAUDID 1 MG/ML IJ SOLN: 1 MG/ML | INTRAMUSCULAR | Qty: 0.5

## 2016-10-25 MED FILL — BUPIVACAINE HCL (PF) 0.25 % IJ SOLN: 0.25 % | INTRAMUSCULAR | Qty: 30

## 2016-10-25 MED FILL — LACTATED RINGERS IV SOLN: INTRAVENOUS | Qty: 1000

## 2016-10-25 MED FILL — CEFAZOLIN 3000 MG D5W 100 ML IVPB: Qty: 3

## 2016-10-25 MED FILL — ROXICET 5-325 MG PO TABS: 5-325 MG | ORAL | Qty: 2

## 2016-10-25 MED FILL — CYCLOBENZAPRINE HCL 10 MG PO TABS: 10 MG | ORAL | Qty: 1

## 2016-10-25 MED FILL — STERILE WATER FOR INJECTION IJ SOLN: INTRAMUSCULAR | Qty: 20

## 2016-10-25 MED FILL — CEFAZOLIN SODIUM 1 G IJ SOLR: 1 g | INTRAMUSCULAR | Qty: 1000

## 2016-10-25 MED FILL — SODIUM CHLORIDE 0.9 % IV SOLN: 0.9 % | INTRAVENOUS | Qty: 100

## 2016-10-25 NOTE — H&P (Signed)
I have reviewed the history and physical and examined the patient and find no relevant changes.   I have reviewed with the patient and/or family the risks, benefits, and alternatives to the procedure.   H&P REVIEWED AND IS IN CHART/EPIC   IT WILL NOT BE DUPLICATED

## 2016-10-25 NOTE — Progress Notes (Signed)
Pt arrived to pacu awake but drowsy.  VSS.  Will continue to monitor

## 2016-10-25 NOTE — Op Note (Signed)
PATIENT NAME Molly [name]    SURGEON    Laurelyn SickleSuresh Rochell Puett, M.D.    SURGERY DATE  10/25/2016 2:28 PM    AGE 56 y.o. PATIENT TYPE female    PRE-OPERATIVE DIAGNOSIS: left knee osteoarthritis    POST-OPERATIVE DIAGNOSIS: left knee osteoarthritis    PROCEDURE PERFORMED: left total knee replacement using computer navigation [facility](MAKO Robot); all three components were cemented using tobramycin cement; posterior cruciate retaining implants. Median parapatellar arthrotomy; with lateral retinacular release.    Implants used:  STRYKER TRIATHALON TKR SYSTEM WITH:    Size 6 CR Femoral component                          Size 6 primary tibial baseplate                          Size 11 mm X3 polyethylene CS tibial insert                          Size 33 x9 mm symmetric patellar component    FIRST ASSISTANT: Jorene GuestMichael Dee, PA-C    SECOND ASSISTANT: A D'Heurle MD     ANESTHESIA: Spinal with adductor canal nerve block.    COMPLICATIONS: None apparent.    POST-OP CONDITION:  To recovery room.    SPECIMENS: BONE  EBL: MINIMAL    INDICATIONS FOR SURGERY: The patient is a 56 y.o.-year-old female with a long history of knee pain affecting the activities of daily living.  Pt had severe pain inability to ambulate, night pain and frequent falls. The pain was refractory to conservative management including injections (corticosteroid/viscosupplementation); PT, Ambulatory aids; oral medication (NSAIDs and pain medication) and bracing for 3 - 6 months. Preop workup showed radiographic changes with osteophyte formation; loss of cartilage space; subchondral sclerosis and deformity.  The patient is scheduled for a left total knee replacement.  The risks, benefits and alternatives of surgery were clearly discussed and the patient wished to proceed with surgery.    OPERATIVE FINDINGS: severe osteoarthritis with tricompartmental involvement and valgus deformity. Complete loss of articular cartilage, osteophyte formation and severe synovitis.   There was also  cloudy synovial fluid, but no frank pus.      DETAILS OF PROCEDURE: The patient was brought to the operating room, and after administration of a spinal anesthetic and femoral nerve block, was placed  on the OR table in the supine position.  The lower extremity was prepped and draped in normal sterile fashion.  The limb was exsanguinated.  Tourniquet was inflated to 350 mmHg.  Informed consent was obtained.  Operative site was signed. Time out was called. Patient was given prophylactic antibiotics and DVT prophylaxsis.      A straight midline incision was made.  Median parapatellar arthrotomy was made.  The patella was subluxated laterally.  The menisci and ACL were resected.  The femoral and tibial tracker pins were placed in a bicortical fashion in the proximal tibial and distal femur.  The MAKO robot from [employer] was used to input our patient registration and anatomical survey into the computer. Initial kinematic survey showed the patient to have persistent significant valgus tracking starting from 5 degrees of extension to approximately 100 degrees of flexion. Preoperative templated sizes and alignment were confirmed preoperatively using a size 6 femoral component, a size 6 tibial tray and a size 11 insert.      Ligament  balancing was then performed using our robotic software. Next, the robotic interactive orthopedic arm was brought into the field. It  was registered. Our tibial cut was first made. Next, our distal femoral cut,  our anterior and posterior chamfer cuts and anterior and posterior cuts were  then made.      Our femoral trial and tibial trial were the placed along with an insert. The patella was resurfaced using a symmetric patella component using our reamer system for our  patella. Trowels were then placed. The knee was put through a full range of  motion noting full extension and to at least 130 degrees of knee flexion with  balance flexion and extension gap and neutral alignment to approximately  2-3  degrees of varus throughout the entire range of motion.  Ligament balancing was then performed noting excellent ligament balance both medial and laterally. The patella  tracked centrally with  lateral retinacular release. Next, the posterior osteophytes and meniscal remnants were resected.  The pain cocktail was injected into the posterior capsule of the medial and lateral gutters.     Final range of motion: 0 extension to 130 degrees of flexion.    Next all trials were removed.  The femoral and tibia array pins were removed. The cut bony surfaces were irrigated with copious amounts of irrigation solution.  At the appropriate cement consistency the tibial, femoral and patellar components were cemented in place.  All excess cement was removed.  The insert was then placed.  The knee was held in extension as the cement cured.  The knee was irrigated with 5 liters of irrigation solution using pulsatile lavage.  The tourniquet was let down before closure.  Any bleeding vessels were coagulated using the Bovie cautery.  The knee was then closed in layers using #2 Ethibond to close the extensor mechanism, a combination of 0- and 2-0 Monocryl to close the subcutaneous tissue and 3-0 Nylon to close the skin.  Dermaflex was applied to seal the wound and allowed to dry before a dry sterile compression dressing was applied.    The patient was then taken to recovery in stable condition having tolerated the procedure well.    Laurelyn Sickle, M.D.

## 2016-10-25 NOTE — Procedures (Signed)
iPACK BLOCK NOTE  ??  ??  ??  Regional Anesthetic Block requested by surgeon for post-op analgesia  ??  Time performed: ??RN Angelica ChessmanMandy  ??  Risks/benefits/options discussed & accepted by patient. Pt. Informed that block will attenuate but not eliminate post-operative pain.  ??  Surgical procedure: ??Left Knee Replacement  ??  Time-out done, site confirmed &??marked by surgeon/designee: ??Left  ??  IV Sedation: ??Pt sedated with Versed 2mg  ??  Procedure: ????Oxygen @ 3LPM NC  ??????????????????????????????????????????Pulse oximeter monitoring, Sp02>95% during block  ????????????????????????????????????????Chloraprep, 2% lido 5cc local anesthesia  ??????????????????????????????????????????Aseptic technique observed  ??????????????????????????????????????????[id number] Stimuplex needle placed without difficulty via lateral approach  ??????????????????????????????????????????at level of popliteal crease using U/S guidance  ??????????????????????????????????????????Negative aspiration initially &??each 5cc thereafter  ??????????????????????????????????????????20cc 0.25% Bupivacaine injected in 5cc increments without difficulty in space between popliteal a.  ??????????????????????????????????????????&??femoral condyles  ??????????????????????????????????????????Pt. Tolerated well, block onset confirmed  ??????????????????????????????????????????No immediate complications

## 2016-10-25 NOTE — Anesthesia Post-Procedure Evaluation (Signed)
Postoperative Anesthesia Note    Name:    Molly [name]  MRN:      [id number]    Patient Vitals for the past 12 hrs:   BP Temp Temp src Pulse Resp SpO2 Height Weight   10/25/16 1631 - - - - - 100 % - -   10/25/16 1626 131/89 97.7 ??F (36.5 ??C) Oral 72 14 100 % - -   10/25/16 1515 126/72 - - 82 13 100 % - -   10/25/16 1510 103/64 - - 84 11 100 % - -   10/25/16 1505 120/70 - - 84 14 100 % - -   10/25/16 1502 124/77 97.8 ??F (36.6 ??C) Temporal 90 16 100 % - -   10/25/16 1205 (!) 146/79 - - 98 12 100 % - -   10/25/16 1155 (!) 165/89 - - 84 14 100 % - -   10/25/16 1145 (!) 155/100 - - 83 12 100 % - -   10/25/16 1014 (!) 149/100 99.2 ??F (37.3 ??C) Temporal 99 12 97 % 5\' 11"  (1.803 m) 295 lb (133.8 kg)        LABS:    CBC  Lab Results   Component Value Date/Time    WBC 5.5 09/20/2016 12:49 PM    HGB 14.4 09/20/2016 12:49 PM    HCT 43.2 09/20/2016 12:49 PM    PLT 319 09/20/2016 12:49 PM     RENAL  Lab Results   Component Value Date/Time    NA 137 09/20/2016 12:49 PM    K 4.2 09/20/2016 12:49 PM    CL 97 (L) 09/20/2016 12:49 PM    CO2 25 09/20/2016 12:49 PM    BUN 12 09/20/2016 12:49 PM    CREATININE 0.7 09/20/2016 12:49 PM    GLUCOSE 83 09/20/2016 12:49 PM     COAGS  Lab Results   Component Value Date/Time    PROTIME 11.8 10/25/2016 11:37 AM    INR 1.04 10/25/2016 11:37 AM    APTT 29.1 10/25/2016 11:37 AM       Intake & Output:  In: 1900 [I.V.:1900]  Out: 50     Nausea & Vomiting:  No    Level of Consciousness:  Awake    Pain Assessment:  Adequate analgesia    Anesthesia Complications:  No apparent anesthetic complications    SUMMARY      Vital signs stable  OK to discharge from Stage I post anesthesia care.  Care transferred from Anesthesiology department on discharge from perioperative area

## 2016-10-25 NOTE — Other (Addendum)
Patient Acct Nbr:  [id number]  Primary AUTH/CERT:    Primary Insurance Company Name:   [employer] [place]/[employer] SU[employer]  Primary Insurance Plan Name:  [facility] PLUS PLAN [facility] EMP  Primary Insurance Group Number:  [id number]  Primary Insurance Plan Type: N  Primary Insurance Policy Number:  [id number]

## 2016-10-25 NOTE — Anesthesia Pre-Procedure Evaluation (Signed)
Department of Anesthesiology  Preprocedure Note       Name:  Molly [name]   Age:  56 y.o.  DOB:  03-24-1961                                          MRN:  [id number]         Date:  10/24/2016      Surgeon:    Procedure:    Medications prior to admission:   Prior to Admission medications    Medication Sig Start Date End Date Taking? Authorizing Provider   magnesium citrate solution Take 296 mLs by mouth once daily    Historical Provider, MD   mupirocin (BACTROBAN) 2 % ointment 1 22 gram tube. Apply 1 cm (small drop) to a q-tip and swab the inside of each nostril twice a day starting 5 days prior to surgery. 10/12/16   Brooke Pace, MD   cyclobenzaprine (FLEXERIL) 5 MG tablet Take 1???2 tablets nightly as needed 10/11/16   Royston Sinner, MD   DULoxetine (CYMBALTA) 60 MG extended release capsule Take 1 capsule by mouth daily  Patient taking differently: Take 60 mg by mouth nightly  10/11/16   Royston Sinner, MD   hydroxychloroquine (PLAQUENIL) 200 MG tablet Take 1 tablet by mouth 2 times daily  Patient taking differently: Take 400 mg by mouth nightly  10/11/16   Royston Sinner, MD   vitamin D (CHOLECALCIFEROL) 1000 UNIT TABS tablet Take 1,000 Units by mouth daily Twice a day    Historical Provider, MD   acetaminophen (TYLENOL) 650 MG CR tablet Take 650 mg by mouth every 8 hours as needed for Pain    Historical Provider, MD       Current medications:    Current Outpatient Prescriptions   Medication Sig Dispense Refill   ??? magnesium citrate solution Take 296 mLs by mouth once daily     ??? mupirocin (BACTROBAN) 2 % ointment 1 22 gram tube. Apply 1 cm (small drop) to a q-tip and swab the inside of each nostril twice a day starting 5 days prior to surgery. 1 Tube 0   ??? cyclobenzaprine (FLEXERIL) 5 MG tablet Take 1???2 tablets nightly as needed 25 tablet 5   ??? DULoxetine (CYMBALTA) 60 MG extended release capsule Take 1 capsule by mouth daily (Patient taking differently: Take 60 mg by mouth nightly ) 90 capsule 3   ??? hydroxychloroquine  (PLAQUENIL) 200 MG tablet Take 1 tablet by mouth 2 times daily (Patient taking differently: Take 400 mg by mouth nightly ) 180 tablet 3   ??? vitamin D (CHOLECALCIFEROL) 1000 UNIT TABS tablet Take 1,000 Units by mouth daily Twice a day     ??? acetaminophen (TYLENOL) 650 MG CR tablet Take 650 mg by mouth every 8 hours as needed for Pain       No current facility-administered medications for this encounter.        Allergies:    Allergies   Allergen Reactions   ??? Sulfa Antibiotics Rash       Problem List:    Patient Active Problem List   Diagnosis Code   ??? H/O vitamin D deficiency Z86.39   ??? History of non-Hodgkin's lymphoma Z85.72   ??? Impaired glucose tolerance R73.02   ??? HLD (hyperlipidemia) E78.5   ??? Vitamin D deficiency E55.9   ??? S/P left ACL reconstruction 1991 by  Dr. Sonny Masters [id number]   ??? 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 Tear of lateral meniscus of left knee S83.282A   ??? Tear of medial meniscus of left knee S83.242A   ??? Left knee DJD M17.12   ??? Morbid obesity with BMI of 40.0-44.9, adult (HCC) E66.01, Z68.41   ??? Acquired valgus deformity knee M21.069   ??? Knee effusion M25.469   ??? Systemic lupus erythematosus ([place]) M32.9   ??? Primary osteoarthritis of one knee M17.10   ??? Primary osteoarthritis of left knee M17.12   ??? High risk medication use Z79.899   ??? Primary osteoarthritis of left knee M17.12   ??? Left knee pain M25.562       Past Medical History:        Diagnosis Date   ??? Amenorrhea    ??? Cancer ([place])    ??? History of non-Hodgkin's lymphoma 2006    treated with radiation and chemo   ??? Obesity    ??? Systemic lupus     see Dr Baruch Goldmann       Past Surgical History:        Procedure Laterality Date   ??? KNEE SURGERY Left     ACL and meniscus repair   ??? LEEP      in 1990's       Social History:    Social History   Substance Use Topics   ??? Smoking status: Former Smoker     Packs/day: 0.50     Years: 18.00     Types: Cigarettes   ??? Smokeless tobacco: Never Used      Comment: quit 8 years ago   ??? Alcohol use 0.6 oz/week      1 Shots of liquor per week      Comment: 2x/week                                Counseling given: Not Answered      Vital Signs (Current): There were no vitals filed for this visit.                                           BP Readings from Last 3 Encounters:   10/17/16 128/70   09/12/16 (!) 137/96   06/29/16 (!) 152/90       NPO Status:                                                                                 BMI:   Wt Readings from Last 3 Encounters:   10/17/16 295 lb (133.8 kg)   10/13/16 285 lb (129.3 kg)   09/12/16 290 lb (131.5 kg)     There is no height or weight on file to calculate BMI.    Anesthesia Evaluation  Patient summary reviewed and Nursing notes reviewed no history of anesthetic complications:   Airway: Mallampati: III  TM distance: >3 FB   Neck ROM: full  Mouth opening: > = 3 FB Dental:  Pulmonary:Negative Pulmonary ROS and normal exam                              ROS comment: SLE   Cardiovascular:Negative CV ROS                   ROS comment: No chest pain.  > 4 mets.  No anticoagulation.      Neuro/Psych:   Negative Neuro/Psych ROS              GI/Hepatic/Renal: Neg GI/Hepatic/Renal ROS       (-) hiatal hernia and GERD      ROS comment: Non hodgkins lympohma 2006.   Endo/Other: Negative Endo/Other ROS   (+) : arthritis:., .                  ROS comment: obese Abdominal:           Vascular:                                   NPO > MN    Pre-Operative Diagnosis: LEFT KNEE OSTEOARTHRITIS    56 y.o.   BMI:  Body mass index is 41.14 kg/m??.     Vitals:    10/25/16 1014 10/25/16 1145 10/25/16 1155   BP: (!) 149/100 (!) 155/100 (!) 165/89   Pulse: 99 83 84   Resp: _0 Temp: 99.2 ??F (37.3 ??C)     TempSrc: Temporal     SpO2: 97% 100% 100%   Weight: 295 lb (133.8 kg)     Height: _1  (1.803 m)         Allergies   Allergen Reactions   ??? Sulfa Antibiotics Rash       Social History   Substance Use Topics   ??? Smoking status: Former Smoker     Packs/day: 0.50     Years: 18.00      Types: Cigarettes   ??? Smokeless tobacco: Never Used      Comment: quit 8 years ago   ??? Alcohol use 0.6 oz/week     1 Shots of liquor per week      Comment: 2x/week       LABS:    CBC  Lab Results   Component Value Date/Time    WBC 5.5 09/20/2016 12:49 PM    HGB 14.4 09/20/2016 12:49 PM    HCT 43.2 09/20/2016 12:49 PM    PLT 319 09/20/2016 12:49 PM     RENAL  Lab Results   Component Value Date/Time    NA 137 09/20/2016 12:49 PM    K 4.2 09/20/2016 12:49 PM    CL 97 (L) 09/20/2016 12:49 PM    CO2 25 09/20/2016 12:49 PM    BUN 12 09/20/2016 12:49 PM    CREATININE 0.7 09/20/2016 12:49 PM    GLUCOSE 83 09/20/2016 12:49 PM     COAGS  Lab Results   Component Value Date/Time    PROTIME 11.8 10/25/2016 11:37 AM    INR 1.04 10/25/2016 11:37 AM    APTT 29.1 10/25/2016 11:37 AM            Anesthesia Plan      spinal and MAC     ASA 3     (I discussed with the patient the risks and benefits of PIV, adductor canal  NB, ipack block, Spinal, MAC, IV Narcotics, PACU.  All questions were answered the patient agrees with the plan)  Induction: intravenous.      Anesthetic plan and risks discussed with patient.      Plan discussed with CRNA.                Collier Salina, MD   10/24/2016

## 2016-10-25 NOTE — Progress Notes (Signed)
Patient transferred from PACU to Rm 531. Pt is A&O, call light within reach, bed in lowest position and locked. Skin assessment completed.    VS:BP 131/89    Pulse 72    Temp 97.7 ??F (36.5 ??C) (Oral)    Resp 14    Ht 5\' 11"  (1.803 Molly [name])    Wt 295 lb (133.8 kg)    SpO2 100%    BMI 41.14 kg/Molly [name]??       Sonny MastersMohammad N Severo Beber

## 2016-10-25 NOTE — Progress Notes (Signed)
[facility]erve to Dr. [place] re: consult. Molly [name] 10/25/2016 1736

## 2016-10-25 NOTE — Progress Notes (Signed)
Chart reviewed and discussed with nursing staff. Pt is doing well and no active medical issues at this time. VSS. Will follow peripherally. Please call if acute issues arise.

## 2016-10-25 NOTE — Progress Notes (Signed)
Pt quit smoking over 12 years ago

## 2016-10-25 NOTE — Progress Notes (Signed)
Assisted Dr. Regino Schultze with adductor canal nerve block. Aggie Hacker

## 2016-10-25 NOTE — Procedures (Signed)
Lambert KetoBeth A Goswick   1961/03/15  56 y.o.   ULTRASOUND GUIDED ADDUCTOR CANAL FEMORAL NERVE BLOCK   Discussed with patient risks, benefits and alternatives of block (including but not limited to infection, bleeding, and damage to nerves) all questions answered patient agrees with planned procedure  Surgical procedure: TKR  Side: left  Requested by Dr.Nayak  for post operative pain control time-out completed RN Jackie  Pt sedated with Versed 2mg   Monitor on, patient supine, and site prepped with Chloraprep  Lidocaine 2% used for topical local anesthetic  21 gauge 100 mm Stimuplex needle used  Continuous Inplane dynamic ultrasound technique used, relevant anatomy identified (nerves, vessels, muscles), Local anesthetic spread visualized around nerves  Bupivacaine 0.5% 20cc injected in 5 cc increments after negative aspiration each time.  Pt tolerated procedure well.  No complications.  Comments:     Ruthe MannanJames Y Breiana Stratmann

## 2016-10-26 DIAGNOSIS — Z96652 Presence of left artificial knee joint: Secondary | ICD-10-CM

## 2016-10-26 LAB — BASIC METABOLIC PANEL W/ REFLEX TO MG FOR LOW K
Anion Gap: 8 (ref 3–16)
BUN: 9 mg/dL (ref 7–20)
CO2: 30 mmol/L (ref 21–32)
Calcium: 8.8 mg/dL (ref 8.3–10.6)
Chloride: 96 mmol/L — ABNORMAL LOW (ref 99–110)
Creatinine: 0.6 mg/dL (ref 0.6–1.1)
GFR African American: >60 (ref >60)
GFR Non-African American: >60 (ref >60)
Glucose: 130 mg/dL — ABNORMAL HIGH (ref 70–99)
Potassium reflex Magnesium: 4.3 mmol/L (ref 3.5–5.1)
Sodium: 134 mmol/L — ABNORMAL LOW (ref 136–145)

## 2016-10-26 LAB — CBC WITH AUTO DIFFERENTIAL
Basophils %: 0.3 %
Basophils Absolute: 0 10*3/uL (ref 0.0–0.2)
Eosinophils %: 0.9 %
Eosinophils Absolute: 0.1 10*3/uL (ref 0.0–0.6)
Hematocrit: 34.3 % — ABNORMAL LOW (ref 36.0–48.0)
Hemoglobin: 11.6 g/dL — ABNORMAL LOW (ref 12.0–16.0)
Lymphocytes %: 18.5 %
Lymphocytes Absolute: 1.3 10*3/uL (ref 1.0–5.1)
MCH: 31.9 pg (ref 26.0–34.0)
MCHC: 33.8 g/dL (ref 31.0–36.0)
MCV: 94.3 fL (ref 80.0–100.0)
MPV: 7.6 fL (ref 5.0–10.5)
Monocytes %: 17 %
Monocytes Absolute: 1.2 10*3/uL (ref 0.0–1.3)
Neutrophils %: 63.3 %
Neutrophils Absolute: 4.5 10*3/uL (ref 1.7–7.7)
Platelets: 243 10*3/uL (ref 135–450)
RBC: 3.64 M/uL — ABNORMAL LOW (ref 4.00–5.20)
RDW: 12.7 % (ref 12.4–15.4)
WBC: 7.1 10*3/uL (ref 4.0–11.0)

## 2016-10-26 LAB — POCT GLUCOSE
POC Glucose: 118 mg/dl — ABNORMAL HIGH (ref 70–99)
POC Glucose: 158 mg/dl — ABNORMAL HIGH (ref 70–99)

## 2016-10-26 MED ORDER — ENOXAPARIN SODIUM 30 MG/0.3ML SC SOLN
30 MG/0.3ML | INJECTION | Freq: Two times a day (BID) | SUBCUTANEOUS | 0 refills | Status: DC
Start: 2016-10-26 — End: 2016-11-07

## 2016-10-26 MED ORDER — OXYCODONE-ACETAMINOPHEN 5-325 MG PO TABS
5-325 MG | ORAL_TABLET | ORAL | 0 refills | Status: DC | PRN
Start: 2016-10-26 — End: 2016-11-07

## 2016-10-26 MED FILL — ROXICET 5-325 MG PO TABS: 5-325 MG | ORAL | Qty: 2

## 2016-10-26 MED FILL — DOK 100 MG PO CAPS: 100 MG | ORAL | Qty: 1

## 2016-10-26 MED FILL — CALCIUM CHLORIDE 10 % IV SOLN: 10 % | INTRAVENOUS | Qty: 3.3

## 2016-10-26 MED FILL — LACTATED RINGERS IV SOLN: INTRAVENOUS | Qty: 1000

## 2016-10-26 MED FILL — MONOJECT FLUSH SYRINGE 0.9 % IV SOLN: 0.9 % | INTRAVENOUS | Qty: 10

## 2016-10-26 MED FILL — LOVENOX 30 MG/0.3ML SC SOLN: 30 MG/0.3ML | SUBCUTANEOUS | Qty: 0.3

## 2016-10-26 MED FILL — HUMALOG 100 UNIT/ML SC SOLN: 100 UNIT/ML | SUBCUTANEOUS | Qty: 3

## 2016-10-26 MED FILL — DIPRIVAN 200 MG/20ML IV EMUL: 200 MG/20ML | INTRAVENOUS | Qty: 20

## 2016-10-26 MED FILL — CEFAZOLIN 3000 MG D5W 100 ML IVPB: Qty: 3

## 2016-10-26 MED FILL — CYCLOBENZAPRINE HCL 10 MG PO TABS: 10 MG | ORAL | Qty: 1

## 2016-10-26 MED FILL — LIDOCAINE HCL 2 % IJ SOLN: 2 % | INTRAMUSCULAR | Qty: 20

## 2016-10-26 MED FILL — THROMBIN-JMI 5000 UNITS EX SOLR: 5000 units | CUTANEOUS | Qty: 1666

## 2016-10-26 MED FILL — DULOXETINE HCL 60 MG PO CPEP: 60 MG | ORAL | Qty: 1

## 2016-10-26 NOTE — Plan of Care (Signed)
Problem: Musculor/Skeletal Functional Status  Intervention: PT Evaluation/treatment  Progress functional mobility

## 2016-10-26 NOTE — Discharge Instructions (Signed)
Please contact Eliot FordKarly McMains Orthopedic Nurse Navigator with any questions or concerns after your discharge.  Mon-Fri 9am- 5pm [phone].  If you have any issues or concerns after 5pm or on the weekend please call [facility] [phone]  I will call you a few days after your discharge to follow up on your progress.     Orthovitals instructions:  Use app or login into ortho vitals to log vitals twice daily until follow up.  After follow up continue logging vitals once daily until one month after surgery.  Vitals can only be entered into app from 8 am- 5pm, after 5pm you will be unable to enter vitals, so please try and have entry logged before then.  Any questions about orthovitals contact Karly [phone].     .Marland Kitchen

## 2016-10-26 NOTE — Discharge Summary (Signed)
Department of Orthopedic Surgery  Physician Assistant   Discharge Summary    The Lambert [name] is a 56 y.o. female underwent total knee replacement procedure without complication.  Molly [name] was admitted to the floor following Her recovery in the PACU.     Discharge Diagnosis  left Knee Replacement    Current Inpatient Medications    Current Facility-Administered Medications: lactated ringers infusion, , Intravenous, Continuous  cyclobenzaprine (FLEXERIL) tablet 10 mg, 10 mg, Oral, TID PRN  cyclobenzaprine (FLEXERIL) tablet 5 mg, 5 mg, Oral, Nightly PRN  DULoxetine (CYMBALTA) extended release capsule 60 mg, 60 mg, Oral, Nightly  lactated ringers infusion, , Intravenous, Continuous  sodium chloride flush 0.9 % injection 10 mL, 10 mL, Intravenous, 2 times per day  sodium chloride flush 0.9 % injection 10 mL, 10 mL, Intravenous, PRN  acetaminophen (TYLENOL) tablet 650 mg, 650 mg, Oral, Q4H PRN  oxyCODONE-acetaminophen (PERCOCET) 5-325 MG per tablet 1 tablet, 1 tablet, Oral, Q4H PRN **OR** oxyCODONE-acetaminophen (PERCOCET) 5-325 MG per tablet 2 tablet, 2 tablet, Oral, Q4H PRN  docusate sodium (COLACE) capsule 100 mg, 100 mg, Oral, BID  magnesium hydroxide (MILK OF MAGNESIA) 400 MG/5ML suspension 30 mL, 30 mL, Oral, Daily PRN  ondansetron (ZOFRAN) injection 4 mg, 4 mg, Intravenous, Q6H PRN  enoxaparin (LOVENOX) injection 30 mg, 30 mg, Subcutaneous, BID  glucose (GLUTOSE) 40 % oral gel 15 g, 15 g, Oral, PRN  dextrose 50 % solution 12.5 g, 12.5 g, Intravenous, PRN  glucagon (rDNA) injection 1 mg, 1 mg, Intramuscular, PRN  dextrose 5 % solution, 100 mL/hr, Intravenous, PRN  HYDROmorphone (DILAUDID) injection 0.25 mg, 0.25 mg, Intravenous, Q3H PRN **OR** HYDROmorphone (DILAUDID) injection 0.5 mg, 0.5 mg, Intravenous, Q3H PRN  insulin lispro (HUMALOG) injection vial 0-6 Units, 0-6 Units, Subcutaneous, TID WC  insulin lispro (HUMALOG) injection vial 0-3 Units, 0-3 Units, Subcutaneous, Nightly    Post-operatively the  patient???s diet was advanced as tolerated and their incision was checked on POD #1.  The incision is dressing in place, clean, dry and intact with no signs of infection.  The patient remained neurovascularly intact in the lower extremity and had intact pulses distally.  Patient???s calf remained soft and showed no evidence of DVT.  The patient was able to move their leg and ankle/foot without any problems post-operatively.  Physical therapy and occupational therapy were consulted and began working with the patient post-operatively.  The patient progressed with PT/OT as would be expected and continued to improve through their stay.  The patients pain was initially controlled with IV medications but we were able to transition to oral pain medications soon after arrival to the floor and their pain remained under good control through their hospital stay.  From a medical standpoint the patient remained stable and continued to have the medicine team follow throughout their stay.  The patients dressing was changed/incison was checked on day of d/c.  The patient will be discharged at this time to Home  with their current diet restrictions and will continue to follow the total knee precautions outlined to them by us and PT/OT.     Condition on Discharge: Stable    Plan  Return visit in 2 weeks..  Patient was instructed on the use of pain medications, the signs and symptoms of infection, and was given our number to call should they have any questions or concerns following discharge.

## 2016-10-26 NOTE — Care Coordination-Inpatient (Signed)
Meet with patient and family at bedside, discussed role of nurse navigator and gave contact information.  Reviewed reasons to call with questions or concerns, importance of TEDS, Incentive spirometer, pain medication, and physical therapy.  Pt set up with orthovitals and understands when and how to use.  Will follow up with call to patient in a few days.    Keywon Mestre  Orthopedic Nurse Navigator  Phone number: [phone]

## 2016-10-26 NOTE — Progress Notes (Signed)
Physical Therapy  Facility/Department: [facility] - MED SURG/ORTHO  Daily Treatment Note  NAME: Molly [name]  DOB: 1961/01/13  MRN: [id number]    Date of Service: 10/26/2016    Patient Diagnosis(es):   Patient Active Problem List    Diagnosis Date Noted   . Systemic lupus erythematosus (HCC) 09/22/2014     Priority: High   . Status post total left knee replacement 10/26/2016   . Osteoarthritis of left knee 10/25/2016   . Primary osteoarthritis of left knee 09/12/2016   . High risk medication use 01/25/2016   . S/P left ACL reconstruction 1991 by Dr. Henrene Hawking 02/07/2014   . Morbid obesity with BMI of 40.0-44.9, adult (HCC) 02/07/2014   . Vitamin D deficiency 09/19/2013   . Impaired glucose tolerance 02/06/2013   . HLD (hyperlipidemia) 02/06/2013   . H/O vitamin D deficiency 02/05/2013   . History of non-Hodgkin's lymphoma        Past Medical History:   Diagnosis Date   . Amenorrhea    . Cancer (HCC)    . History of non-Hodgkin's lymphoma 2006    treated with radiation and chemo   . Obesity    . Systemic lupus     see Dr Lehman Prom     Past Surgical History:   Procedure Laterality Date   . KNEE ARTHROPLASTY Left 10/25/2016    left total knee replacement using computer navigation [facility])   . KNEE SURGERY Left     ACL and meniscus repair   . LEEP      in 1990's       Restrictions  Restrictions/Precautions  Restrictions/Precautions: Weight Bearing  Required Braces or Orthoses?: Yes  Lower Extremity Weight Bearing Restrictions  Left Lower Extremity Weight Bearing: Weight Bearing As Tolerated  Required Braces or Orthoses  Left Lower Extremity Brace: Knee Brace  LLE Brace Type: KI  Subjective   General  Chart Reviewed: Yes  Response To Previous Treatment: Patient with no complaints from previous session.  Family / Caregiver Present: No  Referring Practitioner: Hollice Espy, PA  Subjective  Subjective: Pt sitting EOB. RN attending to dressing. Pt reports 7/10 pain  General Comment  Comments: cleared by nursing           Orientation  Orientation  Overall Orientation Status: Within Normal Limits  Objective   Bed mobility  Supine to Sit: Unable to assess  Sit to Supine: Unable to assess  Scooting: Stand by assistance  Transfers  Sit to Stand: Contact guard assistance  Stand to sit: Contact guard assistance  Stand Pivot Transfers: Contact guard assistance  Ambulation  Ambulation?: Yes  More Ambulation?: No  Ambulation 1  Surface: level tile  Device: Standard Walker  Other Apparatus: Knee Immobilizer;Left  Assistance: Contact guard assistance  Quality of Gait: step to pattern leading with the L  Distance: 100'  Stairs/Curb  Stairs?: Yes  Stairs  # Steps : 2  Rails: None  Curbs: 6"  Device: Standard walker  Assistance: Minimal assistance     Balance  Posture: Good  Sitting - Static: Good  Sitting - Dynamic: Good  Standing - Static: Good  Standing - Dynamic: Fair;+  Exercises  Straight Leg Raise: x10 L   Quad Sets: x10 L   Heelslides: x10 L   Gluteal Sets: x10   Knee Short Arc Quad: x10 L   Ankle Pumps: x10 L    PROM RLE (degrees)  RLE PROM: WNL  PROM LLE (degrees)  LLE General  PROM: 0-78*  Strength RLE  Strength RLE: [facility]  Strength LLE  Comment: not tested due to pain                 Assessment   Body structures, Functions, Activity limitations: Decreased functional mobility ;Decreased ROM;Decreased strength;Decreased endurance;Decreased balance  Assessment: Pt progressed wtih ambulation distance using SW. Pt able to complete curb step x 2 with CGA-min assist. No goals met but progressed well for safe DC home with 24 hr assist. Pt educated on HEP and has OP PT set up for monday. Pt has RW at home and family able to bring it to convert it to SW. No further questions or concerns by pt. Will contniue to follow while IP  Treatment Diagnosis: decreased mobility   Prognosis: Good  Patient Education: role of PT, precautions, mobility with AD, DC recs  Barriers to Learning: none  REQUIRES PT FOLLOW UP: Yes  Activity Tolerance  Activity  Tolerance: Patient Tolerated treatment well;Patient limited by pain;Patient limited by endurance  PT Equipment Recommendations  Equipment Needed: No       Discharge Recommendations:  24 hour supervision or assist, Outpatient PT    G-Code  PT G-Codes  Functional Assessment Tool Used: AM-PAC  Score: 18  Functional Limitation: Mobility: Walking and moving around  Mobility: Walking and Moving Around Current Status (U4403): At least 40 percent but less than 60 percent impaired, limited or restricted  Mobility: Walking and Moving Around Goal Status [id number]): At least 1 percent but less than 20 percent impaired, limited or restricted  OutComes Score                                                    AM-PAC Score  AM-PAC Inpatient Mobility Raw Score : 18  AM-PAC Inpatient T-Scale Score : 43.63  Mobility Inpatient CMS 0-100% Score: 46.58  Mobility Inpatient CMS G-Code Modifier : CK          Goals  Short term goals  Time Frame for Short term goals: 4 days  Short term goal 1: Pt will complete all transfers with supervision  Short term goal 2: Pt will ambulate x 150' with SW with supervision  Short term goal 3: Pt will complete L LE ther ex per TKA protocol by 3/8  Short term goal 4: Pt will achieve 0-90* L knee flexion   Patient Goals   Patient goals : "get home"    Plan    Plan  Times per week: BID  Times per day: Twice a day  Current Treatment Recommendations: Strengthening, ROM, Balance Training, Building services engineer, Teacher, early years/pre, Investment banker, operational, Home Exercise Program, Patent attorney, Equities trader, Modalities, Mining engineer, Education, & procurement, Positioning  Safety Devices  Type of devices: All fall risk precautions in place, Call light within reach, Patient at risk for falls, Gait belt, Left in chair, Chair alarm in place, Nurse notified  Restraints  Initially in place: No     Therapy Time   Individual Concurrent Group Co-treatment   Time In 1355         Time  Out 1425         Minutes 30         Timed Code Treatment Minutes: [address] Minutes       Yolande Jolly, PT

## 2016-10-26 NOTE — Progress Notes (Signed)
Occupational Therapy   Occupational Therapy Initial Assessment and Treatment  Date: 10/26/2016   Patient Name: Molly [name]  MRN: [id number]     DOB: September 20, 1960    Patient Diagnosis(es): The encounter diagnosis was Primary osteoarthritis of left knee.     has a past medical history of Amenorrhea; Cancer ([place]); History of non-Hodgkin's lymphoma; Obesity; and Systemic lupus.   has a past surgical history that includes LEEP; knee surgery (Left); and Knee Arthroplasty (Left, 10/25/2016).     Restrictions  Restrictions/Precautions  Restrictions/Precautions: Weight Bearing  Required Braces or Orthoses?: Yes  Lower Extremity Weight Bearing Restrictions  Left Lower Extremity Weight Bearing: Weight Bearing As Tolerated  Required Braces or Orthoses  Left Lower Extremity Brace: Knee Brace  LLE Brace Type: KI    Subjective   General  Chart Reviewed: Yes  Patient assessed for rehabilitation services?: Yes  Family / Caregiver Present: Yes (sister-in-law Carolyn)  Referring Practitioner: Jerre Simon, PA  Diagnosis: Pt is s/p L TKA on 10/25/16  Subjective  Subjective: RN cleared pt for tx. Pt supine at session start.  Pain Assessment  Patient Currently in Pain: Yes  Pain Assessment: 0-10  Pain Level: 8  Pain Type: Surgical pain  Pain Location: Knee  Pain Orientation: Left  Pain Descriptors: Aching  Pain Intervention(s): Medication (see eMar), Cold applied  Response to Pain Intervention: Patient Satisfied     Social/Functional History  Social/Functional History  Lives With: Spouse (husband (works full time); pt has people who can provide 24hr A)  Type of Home: [place]: One level (pt has 1 step on 1st floor of house)  Home Access:  (1 +1 STE)  Bathroom Shower/Tub: Tourist information centre manager: Handicap height  Bathroom Equipment: Engineer, site: Standard walker, Rolling walker  ADL Assistance: Independent  Homemaking Assistance: Independent  Ambulation Assistance: Independent  Transfer Assistance:  Teacher, English as a foreign language: Yes  Occupation: Part time employment  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    TEcologist- Technique: Ambulating  Equipment Used: Standard toilet (with L grab bar)  Toilet Transfer: Contact guard assistance    ADL  Feeding: Independent (to drink sitting)  Grooming: Stand by assistance (to wash hands standing)  LE Dressing: Stand by assistance (to don/doff L sock seated and to don underwear seated and standing with set-up 2/2 drain)  Additional Comments: Supine: 152/96, 87bpm. Pt without c/o with seated or standing activity. Pt and family educated on donning/doffing KI and pt able to don with mod A. Pt and family stated understanding     Tone RUE  RUE Tone: Normotonic  Tone LUE  LUE Tone: Normotonic  Coordination  Movements Are Fluid And Coordinated: Yes     Bed mobility  Supine to Sit: Minimal assistance (for LLE, HOB raised)  Scooting: Stand by assistance     Transfers  Sit to stand: Contact guard assistance  Stand to sit: Stand by assistance     Cognition  Overall [facility]       Sensation  Overall Sensation Status: WFL (decreased sensation around L knee)        LUE AROM (degrees)  LUE AROM : WFL  RUE AROM (degrees)  RUE AROM : WFL     LUE Strength  Gross LUE Strength: WFL  RUE Strength  Gross RUE Strength: WFL      Education: Role of OT, safe t/f training, safe use of DME, awareness of deficits, discharge planning, ADL as therapeutic exercise, importance of OOB, WB status, use of KI, knee precautions, car t/f with handout    Assessment   Performance deficits /  Impairments: Decreased functional mobility ;Decreased ADL status;Decreased endurance;Decreased high-level IADLs;Decreased balance  After evaluation, pt found to be presenting with the above mentioned deficits. Pt would benefit from continued skilled occupational therapy to address these deficits, increasing safety and independence with ADL and functional mobility. Pt has good potential to meet therapy goals. Will continue to assess for discharge needs.     Prognosis: Good  Decision Making: Low Complexity  Discharge Recommendations: Home with assist PRN (Initially 24h)  REQUIRES OT FOLLOW UP: Yes  Activity Tolerance  Activity Tolerance: Patient Tolerated treatment well  Safety Devices  Safety Devices in place: Yes  Type of devices: Call light within reach;Chair alarm in place;Left in chair;Gait belt;Nurse notified  OT Equipment Recommendations  Equipment Needed: No        Discharge Recommendations: Home with assist PRN (Initially 24h)     Plan   Plan  Times per week: 5-7    G-Code  OT G-codes  Functional Assessment Tool Used: AM-PAC  Score: 21  Functional Limitation: Self care  Self Care Current Status (X8338): At least 20 percent but less than 40 percent impaired, limited or restricted  Self Care Goal Status (S5053): At least 1 percent but less than 20 percent impaired, limited or restricted    AM-PAC Score   AM-PAC Inpatient Daily Activity Raw Score: 21  AM-PAC Inpatient ADL T-Scale Score : 44.27  ADL Inpatient CMS 0-100% Score: 32.79  ADL Inpatient CMS G-Code Modifier : CJ    Goals  Short term goals  Time Frame for Short term goals: 1 week unless otherwise specified  Short term goal 1: Bed mobility with independence  Short term goal 2: Toilet t/f with mod I  Short term goal 3: 3 grooming tasks standing with supervision  Short term goal 4: Don/doff KI with supervision with family assist prn  Short term goal 5: Pt and family will state understanding of car t/f technique after education by 10/27/16. GOAL MET  3/7.  Patient Goals   Patient goals : "I want to do what I can for myself"       Therapy Time   Individual Concurrent Group Co-treatment   Time In 1016         Time Out 1056         Minutes 40         Timed Code Treatment Minutes: 25 Minutes     If patient is discharged prior to next treatment session, this note will serve as the discharge summary.  Mitchel Honour, OTR/L [phone]

## 2016-10-26 NOTE — Plan of Care (Signed)
Problem: Pain:  Goal: Pain level will decrease  Pain level will decrease   Outcome: Ongoing  Pt educated about using 0-10 scale for pain assessment and encouraged to call staff for any unrelieved pain following interventions. Pt also educated about non-pharmaceutical pain interventions, such as ice and repositioning. Pt responded to education well and verbalized understanding. Will continue to monitor.

## 2016-10-26 NOTE — Care Coordination-Inpatient (Addendum)
DC order noted. Order received for: s/p TKR. Per Waverly Ferrari, PA note: " D/c planning for home with outpatient PT arranged. Pt has DME at home.  DCP updated."

## 2016-10-26 NOTE — Progress Notes (Signed)
Physical Therapy    Facility/Department: [facility] - MED SURG/ORTHO  Initial Assessment    NAME: Molly [name]  DOB: 1960-12-22  MRN: [id number]    Date of Service: 10/26/2016    Patient Diagnosis(es): The encounter diagnosis was Primary osteoarthritis of left knee.     has a past medical history of Amenorrhea; Cancer ([facility]); History of non-Hodgkin's lymphoma; Obesity; and Systemic lupus.   has a past surgical history that includes LEEP; knee surgery (Left); and Knee Arthroplasty (Left, 10/25/2016).    Restrictions  Restrictions/Precautions  Restrictions/Precautions: Weight Bearing  Required Braces or Orthoses?: Yes  Lower Extremity Weight Bearing Restrictions  Left Lower Extremity Weight Bearing: Weight Bearing As Tolerated  Required Braces or Orthoses  Left Lower Extremity Brace: Knee Brace  LLE Brace Type: KI  Vision/Hearing  Vision: Within Functional Limits  Hearing: Within functional limits     Subjective  General  Chart Reviewed: Yes  Patient assessed for rehabilitation services?: Yes  Response To Previous Treatment: Not applicable  Family / Caregiver Present: Yes  Referring Practitioner: Hollice Espy, PA  Referral Date : 10/25/16  Diagnosis: LTKA  Follows Commands: Within Functional Limits  General Comment  Comments: cleared by nursing  Subjective  Subjective: Pt awake in bed. Reports pain that "isn't too bad" 7-8/10 L knee pain   Pain Screening  Patient Currently in Pain: Yes          Orientation  Orientation  Overall Orientation Status: Within Normal Limits    Social/Functional History  Social/Functional History  Lives With: Spouse (husband (works full time); pt has people who can provide 24hr A)  Type of Home: House  Home Layout: One level (pt has 1 step on 1st floor of house)  Home Access:  (1 +1 STE)  Bathroom Shower/Tub: Pension scheme manager: Handicap height  Bathroom Equipment: Buyer, retail: Standard walker, Rolling walker  ADL Assistance: Independent  Homemaking Assistance:  Independent  Ambulation Assistance: Independent  Transfer Assistance: Surveyor, minerals: Yes  Occupation: Part time employment  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  Objective          PROM RLE (degrees)  RLE PROM: WNL  PROM LLE (degrees)  LLE General PROM: 0-78*  Strength RLE  Strength RLE: WFL  Strength LLE  Comment: not tested due to pain  Tone RLE  RLE Tone: Normotonic  Tone LLE  LLE Tone: Normotonic  Sensation  Overall Sensation Status: WFL (decreased sensation around L knee)  Bed mobility  Supine to Sit: Minimal assistance  Sit to Supine: Unable to assess  Transfers  Sit to Stand: Minimal Assistance  Stand to sit: Minimal Assistance  Stand Pivot Transfers: Contact guard assistance  Ambulation  Ambulation?: Yes  More Ambulation?: No  Ambulation 1  Surface: level tile  Device: Standard Walker  Other Apparatus: Knee Immobilizer;Left  Assistance: Contact guard assistance;Minimal assistance  Quality of Gait: step to pattern leading with the L  Distance: 15' to bathroom, 20' to chair  Stairs/Curb  Stairs?: No     Balance  Posture: Good  Sitting - Static: Good  Sitting - Dynamic: Good  Standing - Static: Good  Standing - Dynamic: Fair  Exercises  Straight Leg Raise: x10 L   Quad Sets: x10 L   Heelslides: x10 L   Gluteal Sets: x10   Knee Short Arc Quad: x10 L   Ankle Pumps: x10 L      Assessment   Body structures, Functions,  Activity limitations: Decreased functional mobility ;Decreased ROM;Decreased strength;Decreased endurance;Decreased balance  Assessment: Pt functioning below baseline following L TKA. Pt able to complete L LE ther ex and short ambulation in the room. Pt up in chair for drop and dangle protocol. Pt would benefit from skilled PT to address above deficits and return to PLOF. Recommend home with 24 hr assist and OP PT upon DC  Treatment Diagnosis: decreased mobility   Prognosis: Good  Patient Education: role of PT, precautions, mobility with AD, DC recs  Barriers to Learning:  none  REQUIRES PT FOLLOW UP: Yes  Activity Tolerance  Activity Tolerance: Patient Tolerated treatment well;Patient limited by pain;Patient limited by endurance  PT Equipment Recommendations  Equipment Needed: No     Discharge Recommendations:  24 hour supervision or assist, Outpatient PT      Plan   Plan  Times per week: BID  Times per day: Twice a day  Current Treatment Recommendations: Strengthening, ROM, Balance Training, Building services engineer, Teacher, early years/pre, Investment banker, operational, Home Exercise Program, Patent attorney, Equities trader, Modalities, Mining engineer, Education, & procurement, Positioning  Safety Devices  Type of devices: All fall risk precautions in place, Call light within reach, Patient at risk for falls, Gait belt, Left in chair, Chair alarm in place, Nurse notified  Restraints  Initially in place: No    G-Code  PT G-Codes  Functional Assessment Tool Used: AM-PAC  Score: 18  Functional Limitation: Mobility: Walking and moving around  Mobility: Walking and Moving Around Current Status (Z6109): At least 40 percent but less than 60 percent impaired, limited or restricted  Mobility: Walking and Moving Around Goal Status [phone]): At least 1 percent but less than 20 percent impaired, limited or restricted  OutComes Score                                           AM-PAC Score  AM-PAC Inpatient Mobility Raw Score : 18  AM-PAC Inpatient T-Scale Score : 43.63  Mobility Inpatient CMS 0-100% Score: 46.58  Mobility Inpatient CMS G-Code Modifier : CK          Goals  Short term goals  Time Frame for Short term goals: 4 days  Short term goal 1: Pt will complete all transfers with supervision  Short term goal 2: Pt will ambulate x 150' with SW with supervision  Short term goal 3: Pt will complete L LE ther ex per TKA protocol by 3/8  Short term goal 4: Pt will achieve 0-90* L knee flexion   Patient Goals   Patient goals : "get home"       Therapy Time   Individual  Concurrent Group Co-treatment   Time In 1007         Time Out 1040         Minutes 33         Timed Code Treatment Minutes: 23 Minutes       Yolande Jolly, PT

## 2016-10-26 NOTE — Progress Notes (Addendum)
Department of Orthopedic Surgery  Physician Assistant   Progress Note    Subjective:     Post-Operative Day: 1 Status Post left Total Knee Arthroplasty  Systemic or Specific Complaints:No Complaints so far today.  Pt is noting more muscular pain to the posterior aspect of her knee.  She has worked with PT/OT today.     Objective:     Patient Vitals for the past 24 hrs:   BP Temp Temp src Pulse Resp SpO2   10/26/16 0906 137/81 98.2 ??F (36.8 ??C) Oral 84 14 98 %   10/26/16 0321 133/87 98.1 ??F (36.7 ??C) Oral 79 16 95 %   10/25/16 2253 138/89 98.3 ??F (36.8 ??C) Oral 76 16 95 %   10/25/16 2025 135/85 97.9 ??F (36.6 ??C) Oral 80 14 100 %   10/25/16 1810 107/65 97.3 ??F (36.3 ??C) - 74 14 97 %   10/25/16 1705 123/84 97.4 ??F (36.3 ??C) - 70 14 98 %   10/25/16 1631 - - - - - 100 %   10/25/16 1626 131/89 97.7 ??F (36.5 ??C) Oral 72 14 100 %   10/25/16 1515 126/72 - - 82 13 100 %   10/25/16 1510 103/64 - - 84 11 100 %   10/25/16 1505 120/70 - - 84 14 100 %   10/25/16 1502 124/77 97.8 ??F (36.6 ??C) Temporal 90 16 100 %       General: alert, appears stated age, cooperative and no distress   Wound: Wound clean and dry no evidence of infection.   Motion: Painful range of Motion   DVT Exam: No evidence of DVT seen on physical exam.       Knee swollen but thigh soft to palpation. Moving foot and ankle. Good distal pulses.  constavac in place.       Data Review  CBC: Lab Results   Component Value Date    WBC 7.1 10/26/2016    RBC 3.64 10/26/2016    HGB 11.6 10/26/2016    HCT 34.3 10/26/2016    PLT 243 10/26/2016       Assessment:     Status Post left Total Knee Arthroplasty. Doing well postoperatively.     Plan:      1: Continues current post-op course, IM signed off.   Labs stable post op.  Morbid obesity with BMI of 41.2  2:  Continue Deep venous thrombosis prophylaxis- lovenox.  Pt at high risk of DVT d/t co morbidities.    3:  Continue physical therapy and OT, WBAT  4:  Continue Pain Control PRN  5:  D/c constavac today  6:  D/c  planning for home with outpatient PT arranged. Pt has DME at home.  DCP updated.    7:  Will check on patient this afternoon for possible d/c home    Waverly Ferrari PA-C    Addendum:  Pt constavac removed today, drain site continues to ooze.  Knee effusion noted on exam, assisted with evacuation of bloody fluid and 4x4 tegaderm placed.  Pt doing well post op, able to work on steps with PT this afternoon.  Discussed DVT prophylaxis as well, plan for lovenox at home.  Prescriptions have been filled through the Med[facility] to [facility] Outpatient Pharm[facility].  Patient being given increased dosage/quantity of opoid pain medication in excess of OSMB guidelines which noted a 30 MED daily of opioids due to the fact that he/she has undergone major orthopaedic surgery as outlined in rule 4730-07-04.  Dosages and further duration of the pain medication will be re-evaluated at her post op visit in 2 weeks.  Patient was educated on dosing expectations and limits of prescribing as a result of the new [facility] rules enacted April 21, 2016.  Please also note that this is not the initial opoid prescription issued to this patient but a continuation of medication utilized during the hospital admission as noted in the medical record. [place] report has also been utilized to screen for any abuse history or suspicious activity as outlined in [place] Law.  All efforts have been taken to prevent abuse potential and misuse of opioid medications including education, screening, and close clinical follow up.      Waverly FerrariKaren Linsay Vogt PA-C

## 2016-10-26 NOTE — Progress Notes (Signed)
Pt's IV line removed without complications. Discussed d/c instructions with patient and family, given opportunity to ask questions, and provided new medication education with side effects. Follow up appointment information included in d/c instructions. Pt verbalized understanding of d/c instructions. Patient was discharged to home with all belongings and taken outside via wheelchair.    Molly [name]

## 2016-10-27 MED FILL — MIDAZOLAM HCL 2 MG/2ML IJ SOLN: 2 MG/ML | INTRAMUSCULAR | Qty: 2

## 2016-10-27 NOTE — Telephone Encounter (Signed)
ORTHO VITALS ALERT: Calf pain  Alert to call pt regarding:  Spoke with pt regarding calf pain, pt states she has had this sort of calf discomfort since before surgery and while in hospital.  Pt states not having any sharp shooting pain, negative holman's sign, and no warmth or redness in calves.  Calf measurements look good in OV.  Educated pt to only answer yes to calf pain if it gets worse or if it is new pain.  Pt verbalized understanding.      Eliot FordKarly Hildred Mollica  Orthopedic Nurse Navigator  Phone number: [phone]      Spoke with pt    Incision status: No drainage or redness    Edema/Swelling/Teds: Wearing TEDS, slightly warmer around incision, no redness.    Pain level and status: Doing ok    Use of pain medications: Taking every 5 hours, pt finding balance.     Blood thinner: Lovenox- going fine     Bowels: Had BM today    Home Care Agency active: N/A    Outpatient therapy: Starting monday    Do you have all of your medications: Yes    Changes in medications: No    Ortho Vitals: Going well.     Follow up appointments:    Future Appointments  Date Time Provider Department Center   10/31/2016 11:30 AM Lorine BearsLisa Frye, PT [facility] None   11/07/2016 8:30 AM Laurelyn SickleSuresh Nayak, MD [facility]   12/27/2016 11:00 AM Harley HallmarkSoha Mousa, MD [facility]

## 2016-10-31 ENCOUNTER — Inpatient Hospital Stay: Admit: 2016-10-31 | Attending: Rehabilitative and Restorative Service Providers" | Primary: Gerontology

## 2016-10-31 ENCOUNTER — Encounter: Attending: Rehabilitative and Restorative Service Providers" | Primary: Gerontology

## 2016-10-31 ENCOUNTER — Encounter

## 2016-10-31 NOTE — Plan of Care (Signed)
[facility], [place]  [address], [address]  [place], [place]  [address]  Phone: [phone]  Fax [phone]     Physical Therapy Certification    Dear Referring Practitioner: Laurelyn Sickle MD,    We had the pleasure of evaluating the following patient for physical therapy services at [facility].  A summary of our findings can be found in the initial assessment below.  This includes our plan of care.  If you have any questions or concerns regarding these findings, please do not hesitate to contact me at the office phone number checked above.  Thank you for the referral.       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    Relevant Medical History: Pt had sx 10/25/16 and spent 1 day in hospital then home with HEP. Hx of ACL reconstruction 20 years ago.  Functional Disability Index:PT G-Codes  Functional Assessment Tool Used: LEFS  Score: 85%  Functional Limitation: Mobility: Walking and moving around  Mobility: Walking and Moving Around Current Status (U2725): At least 80 percent but less than 100 percent impaired, limited  or restricted  Mobility: Walking and Moving Around Goal Status [phone]): At least 1 percent but less than 20 percent impaired, limited or restricted    Pain Scale: 6/10  Easing factors: percocet, ice  Provocative factors: walking, sleeping     Type: [] Constant   [x] Intermittent  [] Radiating [] Localized [] other:     Numbness/Tingling: general in LLE since ACL surgery    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    Living Status/Prior Level of Function: Independent with ADLs and IADLs, pt has 2 stairs to get in the house. Bedroom and bath one floor with some platforms. Able to walk with walker. Walk the dogs.    OBJECTIVE:     ROM LEFT RIGHT   HIP Flex     HIP Abd     HIP Ext     HIP IR     HIP ER     Knee ext long sitting GA with QS -10    Knee Flex supine SB 60    Ankle PF     Ankle DF     Ankle In     Ankle Ev     Strength  LEFT RIGHT   HIP Flexors     HIP Abductors     HIP Ext  Hip ER     Knee EXT (quad)     Knee Flex (HS)     Ankle DF     Ankle PF     Ankle Inv     Ankle EV     Quad tone Fair    Circumference (cm)    IP  MP  SP       51  53  56        Reflexes/Sensation: NT post op   [] Dermatomes/Myotomes intact    [] Reflexes equal and normal bilaterally   [] Other:    Joint mobility: NT post op   [] Normal    [] Hypo   [] Hyper    Palpation: edema around joint, slight bruising medial    Functional Mobility/Transfers: IND with care    Posture: WNL    Bandages/Dressings/Incisions: intact and changed by patient, no drainage.    Gait: (include devices/WB status) using standard walker with slight antalgic    Orthopedic Special Tests: none post op                       [x]  Patient history, allergies, meds reviewed. Medical chart reviewed. See intake form.     Review Of Systems (ROS):  [x] Performed Review of systems (Integumentary, CardioPulmonary, Neurological) by intake and observation. Intake form has been scanned into medical record. Patient has been instructed to contact their primary care physician regarding  ROS issues if not already being addressed at this time.      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)   Cardiovascular conditions   [] Hypertension (I10)  [] Hyperlipidemia (E78.5)  [] Angina pectoris (I20)  [] Atherosclerosis (I70)   Musculoskeletal conditions   [] Disc pathology   [] Congenital spine pathologies   [] Prior surgical intervention  [] Osteoporosis (M81.8)  [] Osteopenia (M85.8)   Endocrine conditions   [] Hypothyroid (E03.9)  [] Hyperthyroid Gastrointestinal conditions   [] Constipation (K59.00)   Metabolic conditions   [] Morbid obesity (E66.01)  [] Diabetes type 1(E10.65) or 2 (E11.65)   [] Neuropathy (G60.9)     Pulmonary conditions   [] Asthma (J45)  [] Coughing   [] COPD (J44.9)   Psychological Disorders  [] Anxiety (F41.9)  [] Depression (F32.9)   [] Other:   [x] Other:   Hx of non hodgkins lymphoma       Barriers to/and or personal factors that will affect rehab potential:              [] Age  [] Sex              [] Motivation/Lack of Motivation                        [] Co-Morbidities              [] Cognitive Function, education/learning barriers              [] Environmental, home barriers              [x] profession/work barriers  [] past PT/medical experience  [] other:  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  [x]  Falls Risk assessed and no intervention required.  []  Falls Risk assessed and Patient requires intervention due to being higher risk   TUG score (>12s at risk):     []   Falls education provided, including       G-Codes:  PT G-Codes  Functional Assessment Tool Used: LEFS  Score: 85%  Functional Limitation: Mobility: Walking and moving around  Mobility: Walking and Moving Around Current Status (Z3086): At least 80 percent but less than 100 percent  impaired, limited or restricted  Mobility: Walking and Moving Around Goal Status [phone]): At least 1 percent but less than 20 percent impaired, limited or restricted    ASSESSMENT:   Functional Impairments:     [] Noted lumbar/proximal hip/LE joint hypomobility   [x] Decreased LE functional ROM   [x] Decreased core/proximal hip strength and neuromuscular control   [x] Decreased LE functional strength   [] Reduced balance/proprioceptive control   [] other:      Functional Activity Limitations (from functional questionnaire and intake)   [] Reduced ability to tolerate prolonged functional positions   [] Reduced ability or difficulty with changes of positions or transfers between positions   [] Reduced ability to maintain good posture and demonstrate good body mechanics with sitting, bending, and lifting   [x] Reduced ability to sleep   [x]  Reduced ability or tolerance with driving and/or computer work   [x] Reduced ability to perform lifting, carrying tasks   [x] Reduced ability to squat   [x] Reduced ability to forward bend   [x] Reduced ability to ambulate prolonged functional periods/distances/surfaces   [x] Reduced ability to ascend/descend stairs   [] Reduced ability to run, hop, cut or jump   [] other:    Participation Restrictions   [x] Reduced participation in self care activities   [x] Reduced participation in home management activities   [x] Reduced participation in work activities   [x] Reduced participation in social activities.   [x] Reduced participation in sport/recreation activities.    Classification :    [x] Signs/symptoms consistent with post-surgical status including decreased ROM, strength and function.   [] Signs/symptoms consistent with joint sprain/strain   [] Signs/symptoms consistent with patella-femoral syndrome   [] Signs/symptoms consistent with knee OA/hip OA   [] Signs/symptoms consistent with internal derangement of knee/Hip   [] Signs/symptoms consistent with functional hip weakness/NMR control       [] Signs/symptoms consistent with tendinitis/tendinosis    [] signs/symptoms consistent with pathology which may benefit from Dry needling      [] other:      Prognosis/Rehab Potential:      [] Excellent   [x] Good    [] Fair   [] Poor    Tolerance of evaluation/treatment:    [] Excellent   [x] Good    [] Fair   [] Poor  Physical Therapy Evaluation Complexity Justification  [x]  A history of present problem with:  []  no personal factors and/or comorbidities that impact the plan of care;  [x] 1-2 personal factors and/or comorbidities that impact the plan of care  [] 3 personal factors and/or comorbidities that impact the plan of care  [x]  An examination of body systems using standardized tests and measures addressing any of the following: body structures and functions (impairments), activity limitations, and/or participation restrictions;:  [x]  a total of 1-2 or more elements   []  a total of 3 or more elements   []  a total of 4 or more elements   [x]  A clinical presentation with:  [x]  stable and/or uncomplicated characteristics   []  evolving clinical presentation with changing characteristics  []  unstable and unpredictable characteristics;   [x]  Clinical decision making of [x]  low, []  moderate, []  high complexity using standardized patient assessment instrument and/or measurable assessment of functional outcome.    [x]  EVAL (LOW) 97161 (typically 20 minutes face-to-face)  []  EVAL (MOD) 96295 (typically 30 minutes face-to-face)  []   EVAL (HIGH) 97163 (typically 45 minutes face-to-face)  []  RE-EVAL        PLAN:   Frequency/Duration:  2 days per week for 8 Weeks:  Interventions:  [x]   Therapeutic exercise including: strength training, ROM, for Lower extremity and core   [x]   NMR activation and proprioception for LE, Glutes and Core   [x]   Manual therapy as indicated for LE, Hip and spine to include: Dry Needling/IASTM, STM, PROM, Gr I-IV mobilizations, manipulation.   [x]  Modalities as needed that may include: thermal agents,  E-stim, Biofeedback, [place], iontophoresis as indicated  [x]  Patient education on joint protection, postural re-education, activity modification, progression of HEP.     HEP instruction: issued (see scanned forms)    GOALS:  Patient stated goal: To get back to work    Therapist goals for Patient:   Short Term Goals: To be achieved in: 2 weeks  1. Independent in HEP and progression per patient tolerance, in order to prevent re-injury.   2. Patient will have a decrease in pain to facilitate improvement in movement, function, and ADLs as indicated by Functional Deficits.    Long Term Goals: To be achieved in: 6 weeks  1. Disability index score of 20% or less for the LEFS to assist with reaching prior level of function.   2. Patient will demonstrate increased AROM to -5 to 125 to allow for proper joint functioning as indicated by patients Functional Deficits.   3. Patient will demonstrate an increase in Strength to good proximal hip strength and control, 4+/5 in LE to allow for proper functional mobility as indicated by patients Functional Deficits.   4. Patient will return to IND ambulation with good gait pattern.  5. Pt will be able to return to work. (patient specific functional goal)       Electronically signed by:  Lorine Bears, PT [phone]

## 2016-10-31 NOTE — Other (Signed)
[facility], [place]  [address] [place] [address], [address]  [place], [place]  [id number]  Phone: [phone]  Fax [phone]    Physical Therapy Daily Treatment Note  Date:  10/31/2016    Patient Name:  Lambert KetoBeth A Karle    DOB:  Mar 05, 1961  MRN: [id number]  Restrictions/Precautions:  Post op TKR protocol  Medical/Treatment Diagnosis Information:  ?? 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  Insurance/Certification information:  PT Insurance Information: 09/13/16 MED MUT - $50/25 - $1000DED - $0CP - PT/OT MED NEC -90/10% - NO AUTH  Physician Information:  Referring Practitioner: Laurelyn SickleSuresh Nayak MD  Plan of care signed (Y/N): N    Date of Patient follow up with Physician: 11/07/16    G-Code (if applicable):      Date G-Code Applied:  10/31/16  PT G-Codes  Functional Assessment Tool Used: LEFS  Score: 85%  Functional Limitation: Mobility: Walking and moving around  Mobility: Walking and Moving Around Current Status (N6295(G8978): At least 80 percent but less than 100 percent impaired, limited or restricted  Mobility: Walking and Moving Around Goal Status [phone](G8979): At least 1 percent but less than 20 percent impaired, limited or restricted    Progress Note: [x]   Yes  []   No  Next due by: Visit #10       Latex Allergy:  [x] NO      [] YES  Preferred Language for Healthcare:   [x] English       [] other:    Visit # Insurance Allowable   1 BMN     Pain level:  6/10     SUBJECTIVE:  See eval    OBJECTIVE: See eval  Observation:   Test measurements:      RESTRICTIONS/PRECAUTIONS: none    Exercises/Interventions:     Therapeutic Ex Sets/reps Notes        Seated HS stretch 3x30 sec HEP   Seated calf stretch 3x30 sec HEP   Seated QS BLEs 10 sec 10x HEP   SLR FLX supine Mod to Max Assist 2x HEP   Supine SB rolls FLX Mod Assist 15x HEP heelslide                                                     Manual Intervention                                    NMR re-education                                                  Pt education was provided in post op restrictions, precautions, progression of rehab expectations and timeline.x6'    Therapeutic Exercise and NMR EXR  [x]  [phone](97110) Provided verbal/tactile cueing for activities related to strengthening, flexibility, endurance, ROM for improvements in LE, proximal hip, and core control with self care, mobility, lifting, ambulation.  []  [phone](97112) Provided verbal/tactile cueing for activities related to improving balance, coordination, kinesthetic sense, posture, motor skill, proprioception  to assist with LE, proximal hip, and  core control in self care, mobility, lifting, ambulation and eccentric single leg control.     NMR and Therapeutic Activities:    []  [phone] or 60454) Provided verbal/tactile cueing for activities related to improving balance, coordination, kinesthetic sense, posture, motor skill, proprioception and motor activation to allow for proper function of core, proximal hip and LE with self care and ADLs  []  (09811) Gait Re-education- Provided training and instruction to the patient for proper LE, core and proximal hip recruitment and positioning and eccentric body weight control with ambulation re-education including up and down stairs     Home Exercise Program:    [x]  (97110) Reviewed/Progressed HEP activities related to strengthening, flexibility, endurance, ROM of core, proximal hip and LE for functional self-care, mobility, lifting and ambulation/stair navigation   []  (97112)Reviewed/Progressed HEP activities related to improving balance, coordination, kinesthetic sense, posture, motor skill, proprioception of core, proximal hip and LE for self care, mobility, lifting, and ambulation/stair navigation      Manual Treatments:  PROM / STM / Oscillations-Mobs:  G-I, II, III, IV (PA's, Inf., Post.)  []  (97140) Provided manual therapy to mobilize LE, proximal hip and/or LS spine soft tissue/joints for the  purpose of modulating pain, promoting relaxation,  increasing ROM, reducing/eliminating soft tissue swelling/inflammation/restriction, improving soft tissue extensibility and allowing for proper ROM for normal function with self care, mobility, lifting and ambulation.     Modalities:  HV*/CP x15' med/let knee long sitting    Charges:  Timed Code Treatment Minutes: 23   Total Treatment Minutes: 58     [x]  EVAL (LOW) 97161 (typically 20 minutes face-to-face)  []  EVAL (MOD) 91478 (typically 30 minutes face-to-face)  []  EVAL (HIGH) 97163 (typically 45 minutes face-to-face)  []  RE-EVAL     [x]  GN(56213) x  2   []  IONTO  []  NMR (97112) x      []  VASO  []  Manual (97140) x       []  Other:  []  TA x       []  [employer] (08657)  []  ES(attended) (84696)      [x]  ES (un) (29528):     GOALS:   Patient stated goal: To get back to work  ??  Therapist goals for Patient:   Short Term Goals: To be achieved in: 2 weeks  1. Independent in HEP and progression per patient tolerance, in order to prevent re-injury.   2. Patient will have a decrease in pain to facilitate improvement in movement, function, and ADLs as indicated by Functional Deficits.  ??  Long Term Goals: To be achieved in: 6 weeks  1. Disability index score of 20% or less for the LEFS to assist with reaching prior level of function.   2. Patient will demonstrate increased AROM to -5 to 125 to allow for proper joint functioning as indicated by patients Functional Deficits.   3. Patient will demonstrate an increase in Strength to good proximal hip strength and control, 4+/5 in LE to allow for proper functional mobility as indicated by patients Functional Deficits.   4. Patient will return to IND ambulation with good gait pattern.  5. Pt will be able to return to work. (patient specific functional goal)         Progression Towards Functional goals:  []  Patient is progressing as expected towards functional goals listed.    []  Progression is slowed due to complexities  listed.  []  Progression has been slowed due to co-morbidities.  [x]  Plan just implemented, too soon to  assess goals progression  []  Other:     ASSESSMENT:  See eval    Treatment/Activity Tolerance:  [x]  Patient tolerated treatment well []  Patient limited by fatique  []  Patient limited by pain  []  Patient limited by other medical complications  []  Other:     Prognosis: [x]  Good []  Fair  []  Poor          Patient Requires Follow-up: [x]  Yes  []  No    PLAN: See eval       []  Continue per plan of care []  Alter current plan (see comments)  [x]  Plan of care initiated []  Discharge     Electronically signed by: Lorine Bears, [phone]

## 2016-11-03 ENCOUNTER — Inpatient Hospital Stay: Admit: 2016-11-03 | Attending: Rehabilitative and Restorative Service Providers" | Primary: Gerontology

## 2016-11-03 NOTE — Other (Signed)
[facility] [place]  [address], [address]  [place], [place]  [address]  Phone: [phone]  Fax [phone]    Physical Therapy Daily Treatment Note  Date:  11/03/2016    Patient Name:  Molly [name]    DOB:  1961/06/11  MRN: [id number]  Restrictions/Precautions:  Post op TKR protocol  Medical/Treatment Diagnosis Information:  ?? 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  Insurance/Certification information:  PT Insurance Information: 09/13/16 MED MUT - $50/25 - $1000DED - $0CP - PT/OT MED NEC -90/10% - NO AUTH  Physician Information:  Referring Practitioner: Laurelyn SickleSuresh Nayak MD  Plan of care signed (Y/N): N    Date of Patient follow up with Physician: 11/07/16    G-Code (if applicable):      Date G-Code Applied:  10/31/16       Progress Note: [x]   Yes  []   No  Next due by: Visit #10       Latex Allergy:  [x] NO      [] YES  Preferred Language for Healthcare:   [x] English       [] other:    Visit # Insurance Allowable   2 BMN     Pain level:  4/10     SUBJECTIVE:  "Everything is feeling better. Straight leg raise at home is getting better"    OBJECTIVE:   Observation: Swelling around lateral knee and TTP around lateral knee, and upper medial calf and distal hamstring   Test measurements: knee flexion supine 87     RESTRICTIONS/PRECAUTIONS: none    Exercises/Interventions:     Therapeutic Ex Sets/reps Notes        Seated HS stretch 3x30 sec HEP   Seated calf stretch 3x30 sec HEP   HEP   SLR FLX supine Mod 2x10  HEP   Supine SB rolls FLX Mod Assist 15x HEP heelslide   Slide lay ABD  Mod assist 15x     Supine heel slide w/ strap 15x     Bike  2' Partial ROM    Slant board LLE 3x30"                                   Manual Intervention     STM lateral joint line and quat, post upper calf, and distal hamstring   Prone quad stretch  15'                             NMR re-education                                                   Pt education was provided in post op restrictions, precautions, progression of rehab expectations and timeline.x6'    Therapeutic Exercise and NMR EXR  [x]  [phone](97110) Provided verbal/tactile cueing for activities related to strengthening, flexibility, endurance, ROM for improvements in LE, proximal hip, and core control with self care, mobility, lifting, ambulation.  []  [phone](97112) Provided verbal/tactile cueing for activities related to improving balance, coordination, kinesthetic sense, posture, motor skill, proprioception  to assist with LE, proximal hip, and core control in self care, mobility, lifting,  ambulation and eccentric single leg control.     NMR and Therapeutic Activities:    []  [phone] or 60454) Provided verbal/tactile cueing for activities related to improving balance, coordination, kinesthetic sense, posture, motor skill, proprioception and motor activation to allow for proper function of core, proximal hip and LE with self care and ADLs  []  (09811) Gait Re-education- Provided training and instruction to the patient for proper LE, core and proximal hip recruitment and positioning and eccentric body weight control with ambulation re-education including up and down stairs     Home Exercise Program:    [x]  (97110) Reviewed/Progressed HEP activities related to strengthening, flexibility, endurance, ROM of core, proximal hip and LE for functional self-care, mobility, lifting and ambulation/stair navigation   []  (97112)Reviewed/Progressed HEP activities related to improving balance, coordination, kinesthetic sense, posture, motor skill, proprioception of core, proximal hip and LE for self care, mobility, lifting, and ambulation/stair navigation      Manual Treatments:  PROM / STM / Oscillations-Mobs:  G-I, II, III, IV (PA's, Inf., Post.)  []  (97140) Provided manual therapy to mobilize LE, proximal hip and/or LS spine soft tissue/joints for the purpose of modulating pain, promoting relaxation,   increasing ROM, reducing/eliminating soft tissue swelling/inflammation/restriction, improving soft tissue extensibility and allowing for proper ROM for normal function with self care, mobility, lifting and ambulation.     Modalities:  HV/CP x15' med/let knee long sitting    Charges:  Timed Code Treatment Minutes: 45   Total Treatment Minutes: 60     []  EVAL (LOW) 97161 (typically 20 minutes face-to-face)  []  EVAL (MOD) 91478 (typically 30 minutes face-to-face)  []  EVAL (HIGH) 97163 (typically 45 minutes face-to-face)  []  RE-EVAL     [x]  GN(56213) x  2   []  IONTO  []  NMR (97112) x      []  VASO  [x]  Manual (97140) x  1    []  Other:  []  TA x       []  Mech Traction (08657)  []  ES(attended) (84696)      [x]  ES (un) (29528):     GOALS:   Patient stated goal: To get back to work  ??  Therapist goals for Patient:   Short Term Goals: To be achieved in: 2 weeks  1. Independent in HEP and progression per patient tolerance, in order to prevent re-injury.   2. Patient will have a decrease in pain to facilitate improvement in movement, function, and ADLs as indicated by Functional Deficits.  ??  Long Term Goals: To be achieved in: 6 weeks  1. Disability index score of 20% or less for the LEFS to assist with reaching prior level of function.   2. Patient will demonstrate increased AROM to -5 to 125 to allow for proper joint functioning as indicated by patients Functional Deficits.   3. Patient will demonstrate an increase in Strength to good proximal hip strength and control, 4+/5 in LE to allow for proper functional mobility as indicated by patients Functional Deficits.   4. Patient will return to IND ambulation with good gait pattern.  5. Pt will be able to return to work. (patient specific functional goal)         Progression Towards Functional goals:  []  Patient is progressing as expected towards functional goals listed.    []  Progression is slowed due to complexities listed.  []  Progression has been slowed due to  co-morbidities.  [x]  Plan just implemented, too soon to assess goals progression  []  Other:  ASSESSMENT:  Patient is having decreased pain and swelling. ROM is starting to improve since last visit. Doctor stated through ortho vitals that upper calf pain and inflammation was normal and there was nothing to be concerned about. Patient is still ambulating with a walking and was taught proper gait by PT.     Treatment/Activity Tolerance:  [x]  Patient tolerated treatment well []  Patient limited by fatique  []  Patient limited by pain  []  Patient limited by other medical complications  []  Other:     Prognosis: [x]  Good []  Fair  []  Poor          Patient Requires Follow-up: [x]  Yes  []  No    PLAN: Continue working on ROM and decreasing pain.        [x]  Continue per plan of care []  Alter current plan (see comments)  []  Plan of care initiated []  Discharge     Electronically signed by:  Sharon Seller PTAS   Co signed: Lorine Bears, [phone]

## 2016-11-03 NOTE — Telephone Encounter (Signed)
Attempted to contact patient.  Left voicemail for patient stating purpose and call back number.   Kion Huntsberry  Orthopedic Nurse Navigator  Phone number: [phone]

## 2016-11-04 NOTE — Telephone Encounter (Signed)
Spoke with pt, doing really well.    Incision status: No drainage or redness, pt changed dressing and incision looks good.     Edema/Swelling/Teds: Swelling has gone down.     Pain level and status: Not to bad, has been using tylenol as pt is out of pain medication.  Instructed pt to call office for refill and that is was normal to need pain medication still at this point.     Use of pain medications: Using tylenol but will call for refill.     Blood thinner: Going fine.    Bowels: moving well, did have to use magnesium citrate.     Home Care Agency active: N/A    Outpatient therapy: Going great.    Do you have all of your medications: No, will call office for refill.    Changes in medications: No    Ortho Vitals: Has only been putting vitals in once daily and forgets on therapy days because she is tired.  Encouraged pt to keep using OV and put vitals in at least once daily until she is one month post op.  Pt in agreement.      Follow up appointments:    Future Appointments  Date Time Provider Department Center   11/07/2016 8:30 AM Laurelyn SickleSuresh Nayak, MD [facility]   11/08/2016 10:00 AM Marcelino ScotMichelle P Littmann, PT [facility] None   11/10/2016 10:30 AM Marcelino ScotMichelle P Littmann, PT [facility] None   11/15/2016 10:00 AM Marcelino ScotMichelle P Littmann, PT [facility] None   11/18/2016 10:00 AM Marcelino ScotMichelle P Littmann, PT [facility] None   11/22/2016 10:00 AM Lorine BearsLisa Frye, PT [facility] None   11/25/2016 10:00 AM Marcelino ScotMichelle P Littmann, PT [facility] None   12/27/2016 11:00 AM Harley HallmarkSoha Mousa, MD [facility]

## 2016-11-07 ENCOUNTER — Ambulatory Visit: Admit: 2016-11-07 | Payer: PRIVATE HEALTH INSURANCE | Primary: Gerontology

## 2016-11-07 ENCOUNTER — Ambulatory Visit
Admit: 2016-11-07 | Discharge: 2016-11-07 | Payer: PRIVATE HEALTH INSURANCE | Attending: Orthopaedic Surgery | Primary: Gerontology

## 2016-11-07 DIAGNOSIS — Z96652 Presence of left artificial knee joint: Secondary | ICD-10-CM

## 2016-11-07 MED ORDER — NABUMETONE 500 MG PO TABS
500 MG | ORAL_TABLET | Freq: Two times a day (BID) | ORAL | 3 refills | Status: DC
Start: 2016-11-07 — End: 2018-02-19

## 2016-11-07 MED ORDER — OXYCODONE-ACETAMINOPHEN 5-325 MG PO TABS
5-325 | ORAL_TABLET | ORAL | 0 refills | Status: AC | PRN
Start: 2016-11-07 — End: 2016-11-14

## 2016-11-07 NOTE — Patient Instructions (Signed)
Management discussed and patient voiced understanding.  They were instructed to follow up with their PCP regarding any abnormal vitals reading.

## 2016-11-07 NOTE — Progress Notes (Signed)
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  Patient continues to need 1-2 tabs of Percocet every 4-6 hours to control her pain. There have been no fevers or chills.    Pain Assessment  Location of Pain: Knee  Location Modifiers: Left  Severity of Pain: 6  Quality of Pain: Dull, Aching  Duration of Pain: Persistent  Frequency of Pain: Intermittent  Aggravating Factors: Walking, Bending, Standing (sleeping at night)  Limiting Behavior: Some  Relieving Factors: Rest, Ice]    Physical examination: The incision is clean, dry, and intact with no drainage or signs of infection. Range of motion is 0 degrees of extension to 80 degrees of flexion. There is expected swelling with no evidence of DVT and a negative Homans sign. Neurovascular exam is intact.    Radiographs: X-rays taken today including AP, lateral, and skyline views of the left knee show excellent alignment of the prosthesis. No evidence of septic or aseptic loosening or periprosthetic fractures.    Assessment/plan: We would like to begin outpatient physical therapy to restore range of motion and strength. The patient was given local wound care instructions. We did refill their pain medication today. We will followup in 1 weeks for reevaluation.  Hopefully at next visit sutures will be removed.  Patient still needs 1-2 tabs of Percocet 5/325 every 4-6 hours to control her pain.  She is in the immediate postoperative period.    Patient being given increased dosage/quantity of opoid pain medication in excess of OSMB guidelines which noted a 30 MED daily of opioids due to the fact that he/she has undergone major orthopaedic surgery as outlined in rule 4730-07-04.  Dosages and further duration of the pain medication will be re-evaluated at her post op visit in 2 weeks.  Patient was educated on dosing expectations and limits of prescribing as a result of the new [name] rules enacted April 21, 2016.   Please also note that this is not the initial opoid prescription issued to this patient but a continuation of medication utilized during the hospital admission as noted in the medical record. [place]ARRS report has also been utilized to screen for any abuse history or suspicious activity as outlined in [place] Law.  All efforts have been taken to prevent abuse potential and misuse of opioid medications including education, screening, and close clinical follow up.

## 2016-11-08 ENCOUNTER — Inpatient Hospital Stay: Admit: 2016-11-08 | Primary: Gerontology

## 2016-11-08 NOTE — Other (Signed)
[name] [place]  [address]  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  Insurance/Certification information:  PT Insurance Information: 09/13/16 MED MUT - $50/25 - $1000DED - $0CP - PT/OT MED NEC -90/10% - NO AUTH  Physician Information:  Referring Practitioner: Laurelyn Sickle MD  Plan of care signed (Y/N): N    Date of Patient follow up with Physician: 11/07/16    G-Code (if applicable):      Date G-Code Applied:  10/31/16       Progress Note: [x]   Yes  []   No  Next due by: Visit #10       Latex Allergy:  [x] NO      [] YES  Preferred Language for Healthcare:   [x] English       [] other:    Visit # Insurance Allowable   3 BMN     Pain level:  0/10     SUBJECTIVE: Patient states the exercises to bend her knee make it sore.      OBJECTIVE:   Observation:   Test measurements: knee flexion supine 84 w/ SB rolls    RESTRICTIONS/PRECAUTIONS: none    Exercises/Interventions:     Therapeutic Ex Sets/reps Notes        Seated HS stretch 3x30 sec HEP   Seated calf stretch 3x30 sec HEP   Seated QS BLEs 5" x 20HEP   SLR flexion Mod 2x10  HEP   Supine SB rolls FLX Min A15x HEP heelslide   Slidelying ABD  2 x 10 Tactile cues for hip positioning   Supine heel slide w/ strap 15x     Bike  5 min       Standing marching 10x ea                             Manual Intervention     STM lat and medial joint  STM distal HS and prox calf  Gentle G1-2 flexion and extension mobs  PROM knee flex/ext  15' Resistant w/ PROM                            NMR re-education     Gait training 5 min Cues for appropriate walker use (all feet flat  vs two then two)  Cues for heelstrike and walker negotiation                                           Pt education was provided in post op restrictions, precautions, progression of rehab expectations and timeline.x6'    Therapeutic Exercise and NMR EXR  [x]  [phone]) Provided verbal/tactile cueing for activities related to strengthening, flexibility, endurance, ROM for improvements in LE, proximal hip, and core control with self care, mobility, lifting, ambulation.  []  [phone]) Provided verbal/tactile cueing for activities related to improving balance, coordination, kinesthetic sense, posture, motor skill, proprioception  to assist with LE, proximal hip, and core control in self care, mobility, lifting, ambulation and eccentric single leg control.     NMR and Therapeutic Activities:    [x]  [phone] or [phone]) Provided verbal/tactile cueing for activities related to improving balance, coordination, kinesthetic sense, posture, motor skill, proprioception and motor activation to allow for proper function of core, proximal hip and LE with self care and ADLs  []  (09811) Gait Re-education- Provided training and instruction to the patient for proper LE, core and proximal hip recruitment and positioning and eccentric body weight control with ambulation re-education including up and down stairs     Home Exercise Program:    [x]  [phone]) Reviewed/Progressed HEP activities related to strengthening, flexibility, endurance, ROM of core, proximal hip and LE for functional self-care, mobility, lifting and ambulation/stair navigation   []  (97112)Reviewed/Progressed HEP activities related to improving balance, coordination, kinesthetic sense, posture, motor skill, proprioception of core, proximal hip and LE for self care, mobility, lifting, and ambulation/stair navigation      Manual Treatments:  PROM / STM / Oscillations-Mobs:  G-I, II, III, IV (PA's, Inf., Post.)  [x]  (97140) Provided manual therapy to mobilize LE, proximal hip and/or  LS spine soft tissue/joints for the purpose of modulating pain, promoting relaxation,  increasing ROM, reducing/eliminating soft tissue swelling/inflammation/restriction, improving soft tissue extensibility and allowing for proper ROM for normal function with self care, mobility, lifting and ambulation.     Modalities:  HV/CP x15' med/let knee long sitting    Charges:  Timed Code Treatment Minutes: 50   Total Treatment Minutes: 65     []  EVAL (LOW) 97161 (typically 20 minutes face-to-face)  []  EVAL (MOD) 29562 (typically 30 minutes face-to-face)  []  EVAL (HIGH) 97163 (typically 45 minutes face-to-face)  []  RE-EVAL     [x]  ZH(08657) x  2   []  IONTO  []  NMR (97112) x      []  VASO  [x]  Manual (97140) x  1    []  Other:  []  TA x       []  Mech Traction (84696)  []  ES(attended) (29528)      [x]  ES (un) (41324):     GOALS:   Patient stated goal: To get back to work    Therapist goals for Patient:   Short Term Goals: To be achieved in: 2 weeks  1. Independent in HEP and progression per patient tolerance, in order to prevent re-injury.   2. Patient will have a decrease in pain to facilitate improvement in movement, function, and ADLs as indicated by Functional Deficits.    Long Term Goals: To be achieved in: 6 weeks  1. Disability index score of 20% or less for the LEFS to assist with reaching prior level of function.   2. Patient will demonstrate increased AROM to -5 to 125 to allow for proper joint functioning as indicated by patients Functional Deficits.   3. Patient will demonstrate an increase in Strength to good proximal hip strength and control, 4+/5 in LE to allow for proper functional mobility as indicated by patients Functional Deficits.   4. Patient will return to IND ambulation with good gait pattern.  5. Pt will be able to return to work. (patient specific functional goal)         Progression Towards Functional goals:  [x]  Patient is progressing as expected towards functional goals listed.    []  Progression is  slowed due to complexities listed.  []  Progression has been slowed due to co-morbidities.  []   Plan just implemented, too soon to assess goals progression  []  Other:     ASSESSMENT:  Patient was initially stiff upon arrival to PT but by the end after manuals, bike and stretching knee flexion had improved.  She required additional gait training today to address safe walker use and gait mechanics.  Did well with standing activity of marching but had decreased stability in SLS on LLE>    Treatment/Activity Tolerance:  [x]  Patient tolerated treatment well []  Patient limited by fatique  []  Patient limited by pain  []  Patient limited by other medical complications  []  Other:     Prognosis: [x]  Good []  Fair  []  Poor          Patient Requires Follow-up: [x]  Yes  []  No    PLAN: Progress ROM and quad tone  [x]  Continue per plan of care []  Alter current plan (see comments)  []  Plan of care initiated []  Discharge     Electronically signed by: Marcelino Scot, PT,DPT [phone]

## 2016-11-10 ENCOUNTER — Encounter: Primary: Gerontology

## 2016-11-11 ENCOUNTER — Inpatient Hospital Stay: Admit: 2016-11-11 | Primary: Gerontology

## 2016-11-11 NOTE — Op Note (Signed)
[facility] [place]  [address], [address]  [place], [place]  [id number]  Phone: [phone]  Fax [phone]    Date: 11/11/2016  Physician: Dr. 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  Pain reported has decreased from 6/10 to 0-4/10    ROM Initial (L) Current (L)   Knee extension -10 deg -5 deg   Knee flexion 60 deg 95 deg                  Strength     Quad tone Fair Fair+                         Functional Activity Checklist: The patient continues to have moderate difficulty with the following:   [x]  Personal care  []  Reaching / overhead  [x]  Standing    [x]  Housework chores  [x]  Climbing  [x]  Driving / riding in a vehicle    []  Work  [x]  Squatting  [x]  Bed / vehicle mobility    [x]  Lifting  [x]  Walking  [x]  Sleeping    []  Pushing / pulling  []  Sitting  []  Concentrating / reading     Specific Functional Improvement and Impression:  Waynetta SandyBeth is progressing well with therapy.  Her motion is improving into flexion and extension both, she is able to get her knee almost all the way straight after manuals.  She has improved strength and has initiated more standing activities which tend to require additional cueing but is overall on target for where we would expect at this time.  She would benefit from additional therapy services in order to address range of motion, strength and balance to return to normal gait mechanics and daily activities.    Summary:   [x]  Patient is progressing as expected for this condition   []  Patient is progressing, but slower than expected for this condition   []  Patient is not progressing  Clinician Recommendations:   [x]  Continue rehabilitation due to objective improvement and continued functional deficits.   []  Follow up periodically to advance home exercise program to match level of function.   []  Continue rehabitation due to  objective improvement and continued functional deficits with progression to work conditioning.   []  Discharge to post-rehab program secondary to maximizing "medical necessity" of physical / occupational therapy.   []  Discharge independent in a home program as:  []  All goals achieved  []  Maximized "medical necessity" of physical / occupational therapy  []  No subjective or objective improvements    Plan: 1-2x/wk for 8-10  Electronically signed by: Marcelino ScotMichelle P Carma Dwiggins, PT,DPT   License #: [id number]    Physician Recommendations:  []  Follow treatment plan as above []  Discontinue physical therapy  []  Change plan to: _______________________________________________________________

## 2016-11-11 NOTE — Other (Signed)
[facility] [place]  [address], [address]  [place], [place]  [address]  Phone: [phone]  Fax [phone]    Physical Therapy Daily Treatment Note  Date:  11/11/2016    Patient Name:  Molly [name]    DOB:  1961/07/19  MRN: [id number]  Restrictions/Precautions:  Post op TKR protocol  Medical/Treatment Diagnosis Information:  ?? 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  Insurance/Certification information:  PT Insurance Information: 09/13/16 MED MUT - $50/25 - $1000DED - $0CP - PT/OT MED NEC -90/10% - NO AUTH  Physician Information:  Referring Practitioner: Laurelyn Sickle MD  Plan of care signed (Y/N): N    Date of Patient follow up with Physician: 11/07/16    G-Code (if applicable):      Date G-Code Applied:  10/31/16       Progress Note: [x]   Yes  []   No  Next due by: Visit #10       Latex Allergy:  [x] NO      [] YES  Preferred Language for Healthcare:   [x] English       [] other:    Visit # Insurance Allowable   4 BMN     Pain level:  4/10     SUBJECTIVE: Patient states she has been sleeping better.     OBJECTIVE:   Observation:   Test measurements: knee flexion supine     RESTRICTIONS/PRECAUTIONS: none    Exercises/Interventions:     Therapeutic Ex Sets/reps Notes        Seated HS stretch 3x30 sec HEP   Seated calf stretch 3x30 sec HEP   Seated QS BLEs 5" x 20HEP   SLR flexion 2 x10  HEP   Slidelying ABD  2 x 10 Tactile cues for hip positioning   Supine heel slide w/ strap 15x     Bike  5 min       Standing marching 10x ea    Bridge w/ add 2 x 10 x 5"    Clamshells 2 x 10 x 3" +HEP   SAQ 2 x 10 x 3" +HEP   Mini squats 15x Cues for form                            Manual Intervention     STM lat and medial joint  STM distal HS and prox calf  Gentle G1-2 flexion and extension mobs  PROM knee flex/ext  15' Improved ability to relax today                            NMR re-education                                                   Pt education was provided in post op restrictions, precautions, progression of rehab expectations and timeline.x6'    Therapeutic Exercise and NMR EXR  [x]  [phone]) Provided verbal/tactile cueing for activities related to strengthening, flexibility, endurance, ROM for improvements in LE, proximal hip, and core control with self care, mobility, lifting, ambulation.  []  [phone]) Provided verbal/tactile cueing for activities related to improving balance, coordination, kinesthetic sense, posture, motor skill,  proprioception  to assist with LE, proximal hip, and core control in self care, mobility, lifting, ambulation and eccentric single leg control.     NMR and Therapeutic Activities:    [x]  [phone](97112 or [phone]) Provided verbal/tactile cueing for activities related to improving balance, coordination, kinesthetic sense, posture, motor skill, proprioception and motor activation to allow for proper function of core, proximal hip and LE with self care and ADLs  []  (09811(97116) Gait Re-education- Provided training and instruction to the patient for proper LE, core and proximal hip recruitment and positioning and eccentric body weight control with ambulation re-education including up and down stairs     Home Exercise Program:    [x]  (97110) Reviewed/Progressed HEP activities related to strengthening, flexibility, endurance, ROM of core, proximal hip and LE for functional self-care, mobility, lifting and ambulation/stair navigation   []  (97112)Reviewed/Progressed HEP activities related to improving balance, coordination, kinesthetic sense, posture, motor skill, proprioception of core, proximal hip and LE for self care, mobility, lifting, and ambulation/stair navigation      Manual Treatments:  PROM / STM / Oscillations-Mobs:  G-I, II, III, IV (PA's, Inf., Post.)  [x]  (97140) Provided manual therapy to mobilize LE, proximal hip and/or LS spine soft tissue/joints for the purpose of modulating pain, promoting  relaxation,  increasing ROM, reducing/eliminating soft tissue swelling/inflammation/restriction, improving soft tissue extensibility and allowing for proper ROM for normal function with self care, mobility, lifting and ambulation.     Modalities:  HV/CP x15' med/let knee long sitting    Charges:  Timed Code Treatment Minutes: 50   Total Treatment Minutes: 65     []  EVAL (LOW) 97161 (typically 20 minutes face-to-face)  []  EVAL (MOD) [id number] (typically 30 minutes face-to-face)  []  EVAL (HIGH) 97163 (typically 45 minutes face-to-face)  []  RE-EVAL     [x]  GN([id number](97110) x  2   []  IONTO  []  NMR (97112) x      []  VASO  [x]  Manual (97140) x  1    []  Other:  []  TA x       []  Mech Traction (08657(97012)  []  ES(attended) (84696(97032)      [x]  ES (un) (29528(97014):     GOALS:   Patient stated goal: To get back to work  ??  Therapist goals for Patient:   Short Term Goals: To be achieved in: 2 weeks  1. Independent in HEP and progression per patient tolerance, in order to prevent re-injury.   2. Patient will have a decrease in pain to facilitate improvement in movement, function, and ADLs as indicated by Functional Deficits.  ??  Long Term Goals: To be achieved in: 6 weeks  1. Disability index score of 20% or less for the LEFS to assist with reaching prior level of function.   2. Patient will demonstrate increased AROM to -5 to 125 to allow for proper joint functioning as indicated by patients Functional Deficits.   3. Patient will demonstrate an increase in Strength to good proximal hip strength and control, 4+/5 in LE to allow for proper functional mobility as indicated by patients Functional Deficits.   4. Patient will return to IND ambulation with good gait pattern.  5. Pt will be able to return to work. (patient specific functional goal)         Progression Towards Functional goals:  [x]  Patient is progressing as expected towards functional goals listed.    []  Progression is slowed due to complexities listed.  []  Progression has been slowed due to  co-morbidities.  []   Plan just implemented, too soon to assess goals progression  []  Other:     ASSESSMENT: See op note.    Treatment/Activity Tolerance:  [x]  Patient tolerated treatment well []  Patient limited by fatique  []  Patient limited by pain  []  Patient limited by other medical complications  []  Other:     Prognosis: [x]  Good []  Fair  []  Poor          Patient Requires Follow-up: [x]  Yes  []  No    PLAN:Strengthen as able  [x]  Continue per plan of care []  Alter current plan (see comments)  []  Plan of care initiated []  Discharge     Electronically signed by: Marcelino Scot, PT,DPT [phone]

## 2016-11-14 ENCOUNTER — Ambulatory Visit
Admit: 2016-11-14 | Discharge: 2016-11-14 | Payer: PRIVATE HEALTH INSURANCE | Attending: Orthopaedic Surgery | Primary: Gerontology

## 2016-11-14 DIAGNOSIS — Z96652 Presence of left artificial knee joint: Secondary | ICD-10-CM

## 2016-11-14 IMAGING — DX DG HAND COMPLETE 3+V*R*
3 series · 3 of 3 positions shown · non-contrast
Comparison: None.

CLINICAL DATA: 53-year-old female with laceration of the first and
second digits from electronic grafts clip per. Initial encounter.

EXAM:
RIGHT HAND - COMPLETE 3+ VIEW

[hand pa]
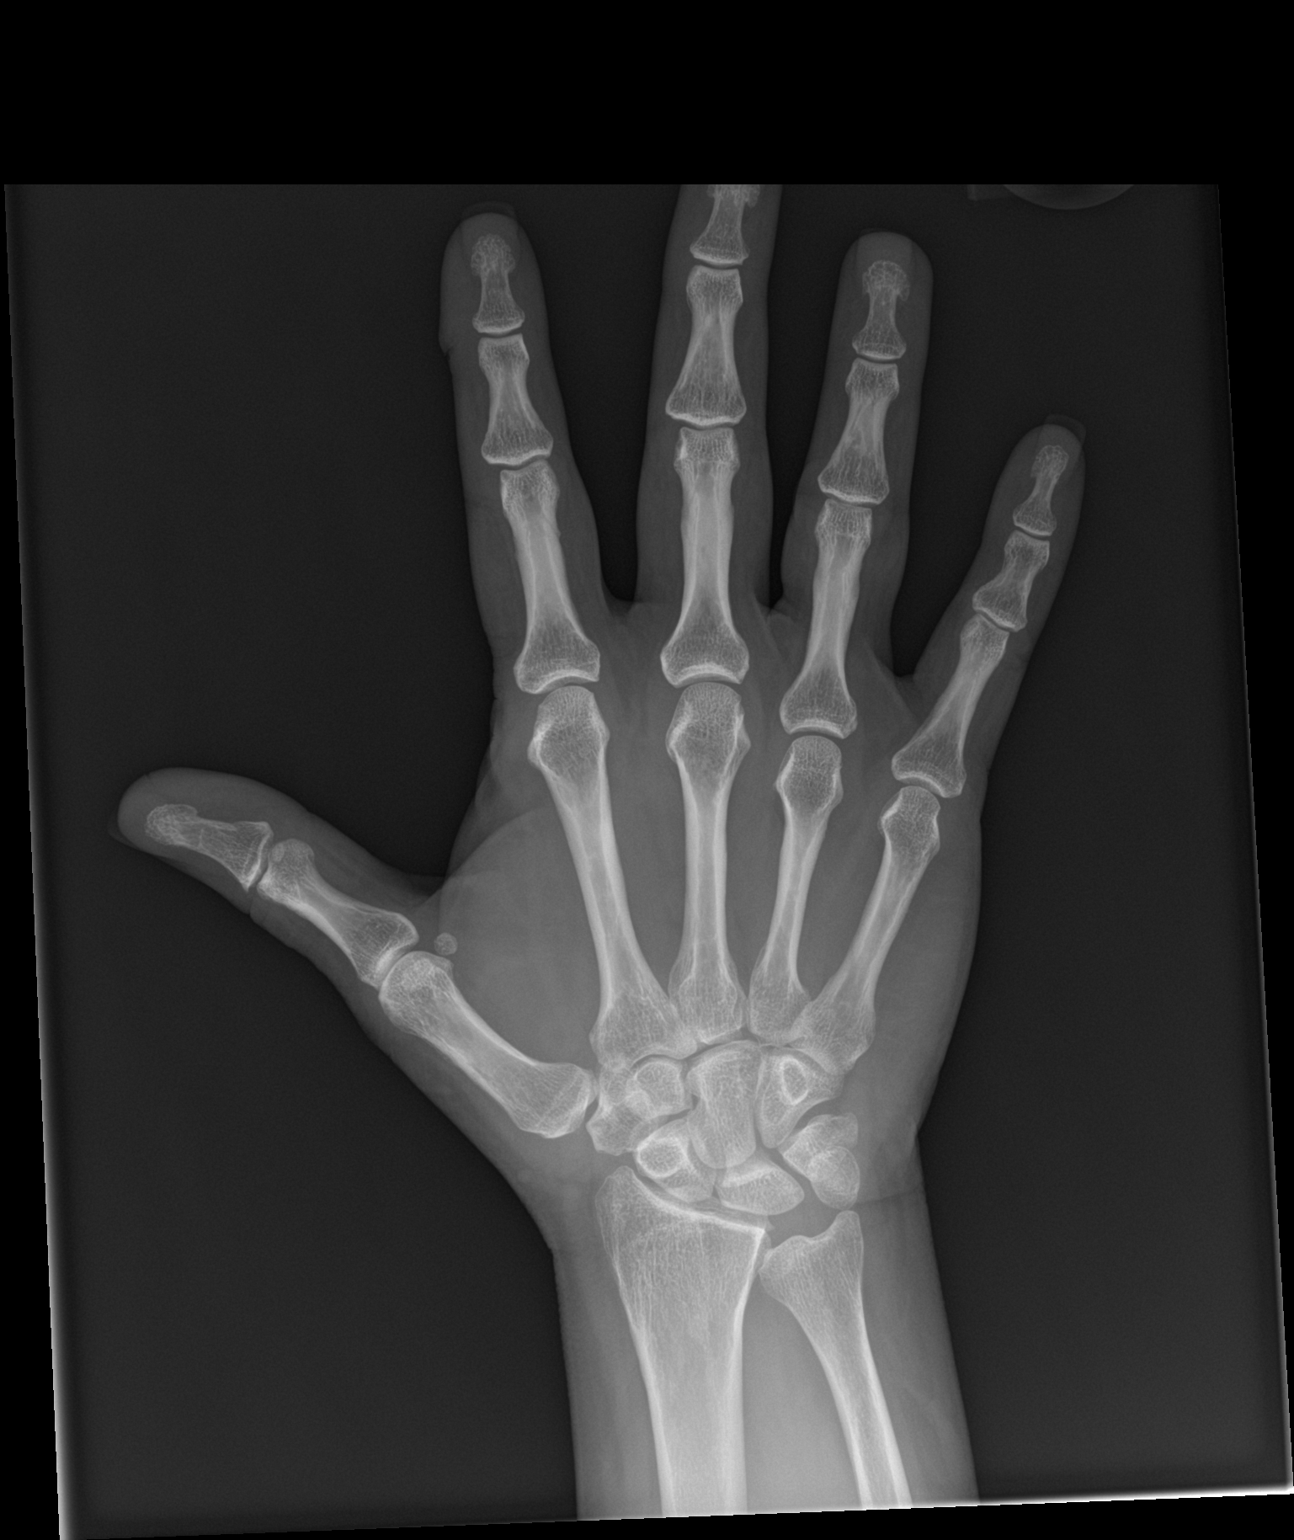

[hand obl]
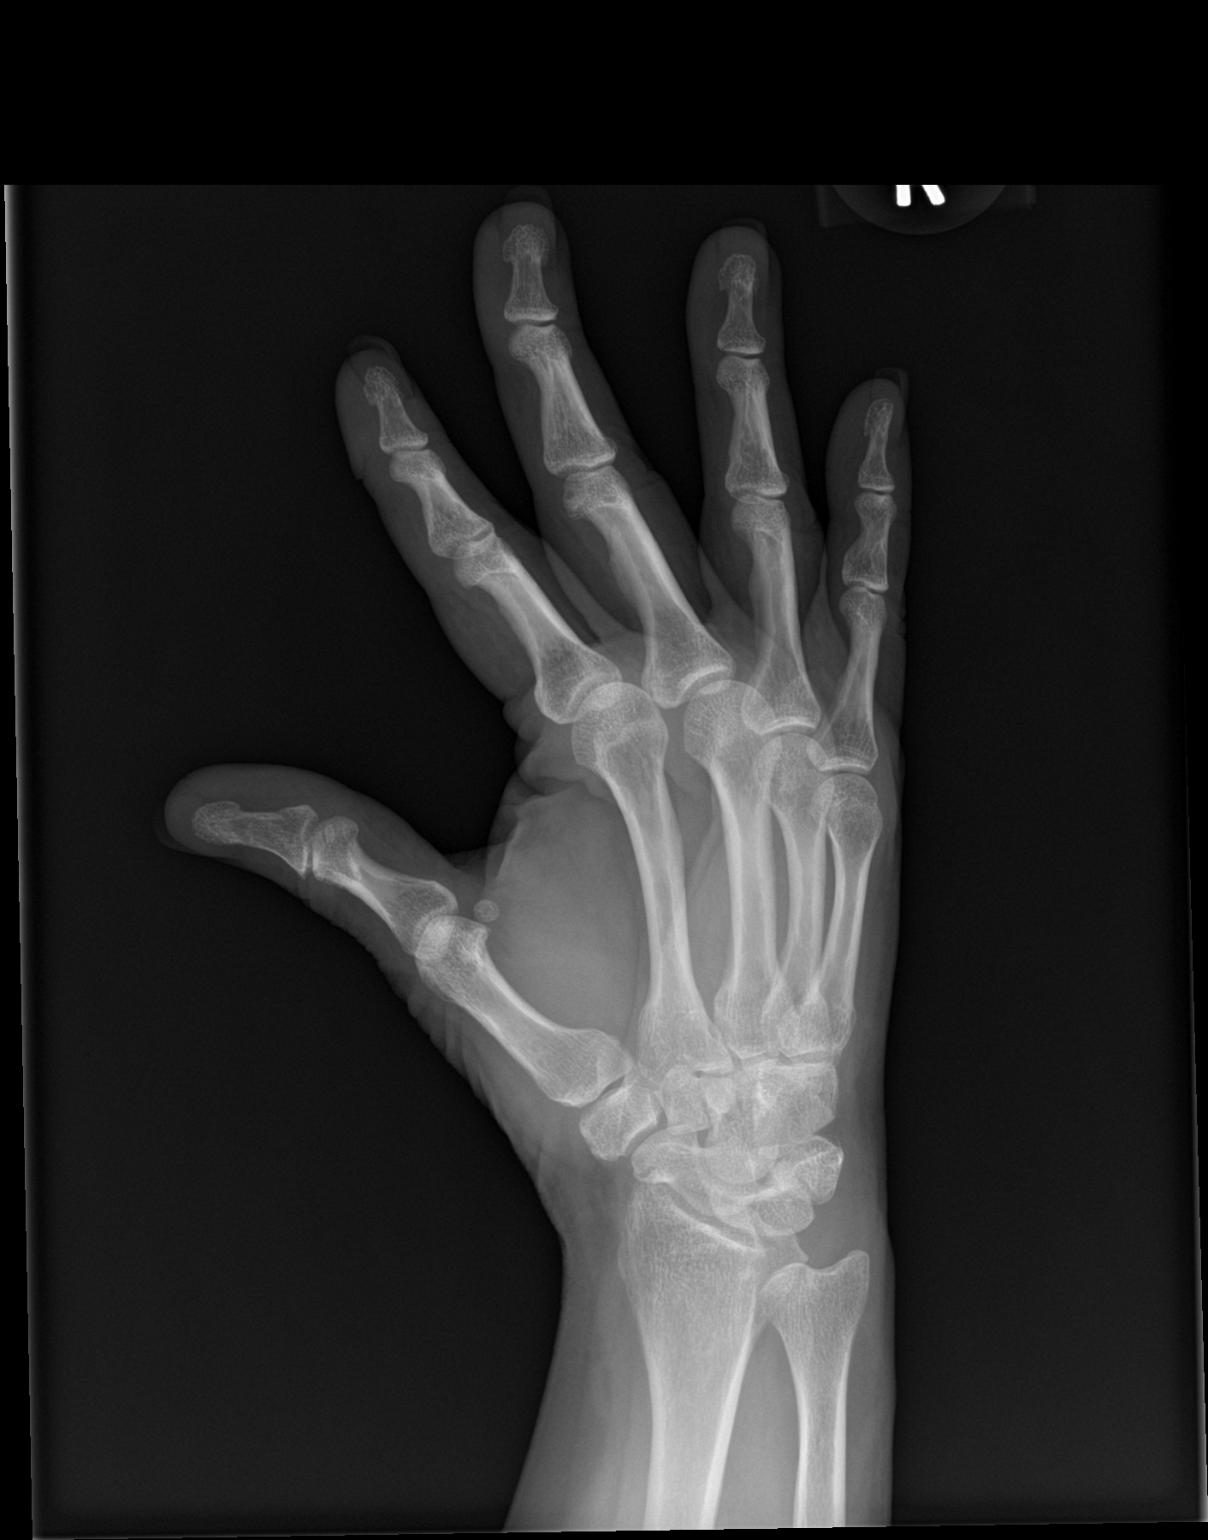

[hand lat]
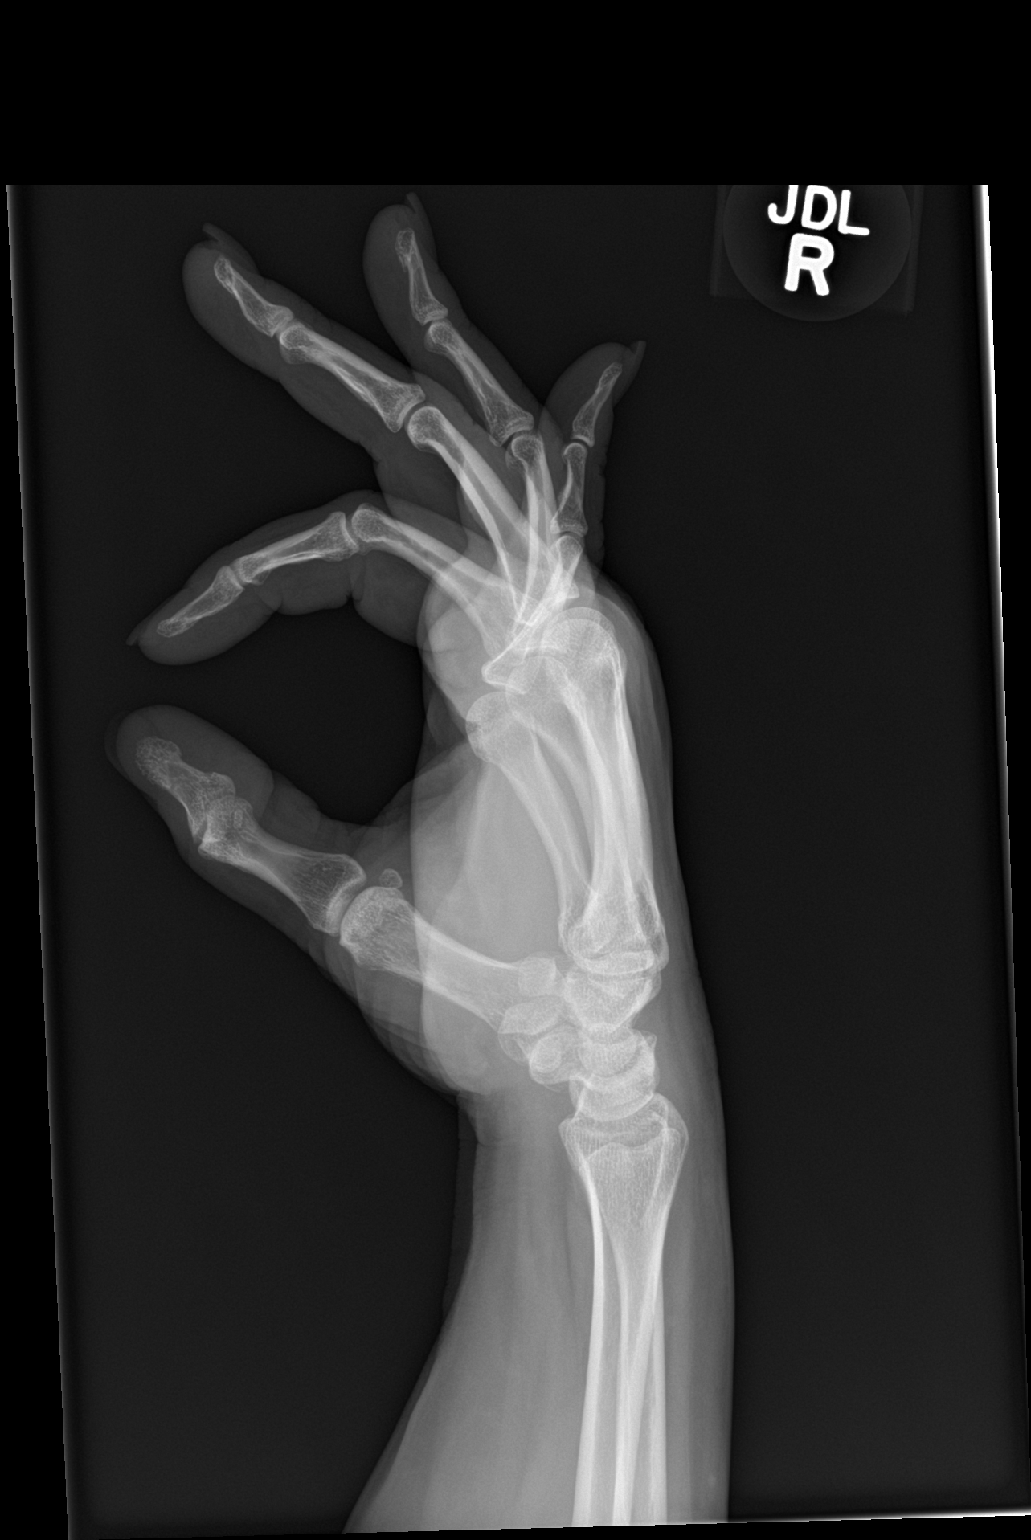

[3 of 3 positions shown; findings below may reference images not displayed]

FINDINGS: Soft tissue injury along the radial aspect of the second finger DIP.
No radiopaque foreign body identified. No subcutaneous gas.
Underlying osseous structures appear intact.

No other discrete soft tissue injury identified. Distal radius and
ulna appear intact. Ulna minus variance. Carpal bone alignment
within normal limits. Metacarpals and phalanges intact. No acute
osseous abnormality identified.
IMPRESSION: No radiopaque foreign body or acute osseous abnormality identified
in the right hand.

## 2016-11-14 NOTE — Patient Instructions (Signed)
Management discussed and patient voiced understanding.  They were instructed to follow up with their PCP regarding any abnormal vitals reading.

## 2016-11-14 NOTE — Progress Notes (Signed)
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  Patient is 3 weeks from surgery.  She is here for wound inspection and most likely suture removal.  Over the last week she has regained 20 of flexion in physical therapy.    Pain Assessment  Location of Pain: Knee  Location Modifiers: Left  Severity of Pain: 1  Frequency of Pain: Intermittent (nightly)  Limiting Behavior: Some  Result of Injury: No  Work-Related Injury: No  Are there other pain locations you wish to document?: No]    Physical examination: The incision is clean, dry, and intact with no drainage or signs of infection. Range of motion is 5 degrees of extension to 90 degrees of flexion. There is expected swelling with no evidence of DVT and a negative Homans sign. Neurovascular exam is intact.      Assessment/plan: We would like to continue outpatient physical therapy to restore range of motion and strength. The patient was given local wound care instructions. We did not refill their pain medication today. We will followup in 2 weeks for reevaluation.  Sutures removed today.

## 2016-11-15 ENCOUNTER — Inpatient Hospital Stay: Admit: 2016-11-15 | Primary: Gerontology

## 2016-11-15 NOTE — Telephone Encounter (Signed)
Spoke with pt, going to really.    Incision status: No drainage or redness, healing well.     Edema/Swelling/Teds: Way down, still there but better.     Pain level and status: Tolerable    Use of pain medications: Using only for PT    Blood thinner: Finished    Bowels: Moving well.    Home Care Agency active: N/A    Outpatient therapy: Going well.    Do you have all of your medications: Yes    Changes in medications: No    Ortho Vitals: Hasn't put vitals in since 11/05/16 encourage pt to continue until one month out.  Pt stated she would resume using kit.     Follow up appointments:    Future Appointments  Date Time Provider [place]   11/18/2016 10:00 AM [place] C[place], PT [facility] AND PT None   11/22/2016 10:00 AM Dominic Pea, PT [facility] AND PT None   11/25/2016 10:00 AM Jeral Pinch, PT [facility] AND PT None   11/28/2016 8:30 AM Brooke Pace, MD [facility]   12/27/2016 11:00 AM Royston Sinner, MD [facility]     Signed off from pt.  Instructed pt to call RN or [place] office with any issues or concerns.

## 2016-11-15 NOTE — Other (Signed)
[facility] [place]  [address], [address]  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  Insurance/Certification information:  PT Insurance Information: 09/13/16 MED MUT - $50/25 - $1000DED - $0CP - PT/OT MED NEC -90/10% - NO AUTH  Physician Information:  Referring Practitioner: Laurelyn Sickle MD  Plan of care signed (Y/N): N    Date of Patient follow up with Physician: 11/07/16    G-Code (if applicable):      Date G-Code Applied:  10/31/16       Progress Note: [x]   Yes  []   No  Next due by: Visit #10       Latex Allergy:  [x] NO      [] YES  Preferred Language for Healthcare:   [x] English       [] other:    Visit # Insurance Allowable   5 BMN     Pain level: 2 /10     SUBJECTIVE: Patient states she is feeling good, thinks she is getting bored. Stated she saw the doctor's office yesterday and they took out sutures, has a large bandage covering incision at this time.    OBJECTIVE:   Observation:   Test measurements: knee flexion supine  w/ manuals 106 deg    RESTRICTIONS/PRECAUTIONS: none    Exercises/Interventions:     Therapeutic Ex Sets/reps Notes        Seated QS 5" x 20HEP   SLR flexion 2 x10  HEP; fatigue at end   Slidelying ABD  2 x 10 Tactile cues for hip positioning   Supine heel slide w/ strap 10" x 10    Bike  5 min          Bridge w/ add 2 x 10 x 5"    SAQ 2# 2 x 10 x 3" +HEP   Mini squats 15x Cues for form, L side slightly behind                            Manual Intervention     STM lat and medial joint  STM distal HS and prox calf  Gentle G1-2 flexion and extension mobs  PROM knee flex/ext   Manual HS and  gastroc stretch 20' Improved ability to relax today                            NMR re-education     Tandem balance 10" x 10 L leg behind   Step ups 4" step BUE 15x                                       Pt education was provided in post op restrictions, precautions, progression of rehab expectations and timeline.x6'    Therapeutic Exercise and NMR EXR  [x]  [phone]) Provided verbal/tactile cueing for activities related to strengthening, flexibility, endurance, ROM for improvements in LE, proximal hip, and core control with self  care, mobility, lifting, ambulation.  []  [id number]) Provided verbal/tactile cueing for activities related to improving balance, coordination, kinesthetic sense, posture, motor skill, proprioception  to assist with LE, proximal hip, and core control in self care, mobility, lifting, ambulation and eccentric single leg control.     NMR and Therapeutic Activities:    [x]  [phone] or [phone]) Provided verbal/tactile cueing for activities related to improving balance, coordination, kinesthetic sense, posture, motor skill, proprioception and motor activation to allow for proper function of core, proximal hip and LE with self care and ADLs  []  (42706) Gait Re-education- Provided training and instruction to the patient for proper LE, core and proximal hip recruitment and positioning and eccentric body weight control with ambulation re-education including up and down stairs     Home Exercise Program:    [x]  (97110) Reviewed/Progressed HEP activities related to strengthening, flexibility, endurance, ROM of core, proximal hip and LE for functional self-care, mobility, lifting and ambulation/stair navigation   []  (97112)Reviewed/Progressed HEP activities related to improving balance, coordination, kinesthetic sense, posture, motor skill, proprioception of core, proximal hip and LE for self care, mobility, lifting, and ambulation/stair navigation      Manual Treatments:  PROM / STM / Oscillations-Mobs:  G-I, II,  III, IV (PA's, Inf., Post.)  [x]  (97140) Provided manual therapy to mobilize LE, proximal hip and/or LS spine soft tissue/joints for the purpose of modulating pain, promoting relaxation,  increasing ROM, reducing/eliminating soft tissue swelling/inflammation/restriction, improving soft tissue extensibility and allowing for proper ROM for normal function with self care, mobility, lifting and ambulation.     Modalities:  HV/CP x15' med/let knee long sitting    Charges:  Timed Code Treatment Minutes: 50   Total Treatment Minutes: 65     []  EVAL (LOW) 97161 (typically 20 minutes face-to-face)  []  EVAL (MOD) 23762 (typically 30 minutes face-to-face)  []  EVAL (HIGH) 97163 (typically 45 minutes face-to-face)  []  RE-EVAL     [x]  GB(15176) x  2   []  IONTO  []  NMR (97112) x      []  VASO  [x]  Manual (97140) x  1    []  Other:  []  TA x       []  Mech Traction (16[address])  []  ES(attended) (71062)      [x]  ES (un) (69485):     GOALS:   Patient stated goal: To get back to work    Therapist goals for Patient:   Short Term Goals: To be achieved in: 2 weeks  1. Independent in HEP and progression per patient tolerance, in order to prevent re-injury.   2. Patient will have a decrease in pain to facilitate improvement in movement, function, and ADLs as indicated by Functional Deficits.    Long Term Goals: To be achieved in: 6 weeks  1. Disability index score of 20% or less for the LEFS to assist with reaching prior level of function.   2. Patient will demonstrate increased AROM to -5 to 125 to allow for proper joint functioning as indicated by patients Functional Deficits.   3. Patient will demonstrate an increase in Strength to good proximal hip strength and control, 4+/5 in LE to allow for proper functional mobility as indicated by patients Functional Deficits.   4. Patient will return to IND ambulation with good gait pattern.  5. Pt will be able to return to work. (patient specific functional goal)         Progression Towards Functional  goals:  [x]  Patient is progressing as expected towards functional  goals listed.    []  Progression is slowed due to complexities listed.  []  Progression has been slowed due to co-morbidities.  []  Plan just implemented, too soon to assess goals progression  []  Other:     ASSESSMENT: Patient's motion progressing well each visit and did better with standing activities.  However, she does require cues for weight shifting onto the left leg with mini squats    Treatment/Activity Tolerance:  [x]  Patient tolerated treatment well []  Patient limited by fatique  []  Patient limited by pain  []  Patient limited by other medical complications  []  Other:     Prognosis: [x]  Good []  Fair  []  Poor          Patient Requires Follow-up: [x]  Yes  []  No    PLAN:Add more standing activities to HEP  [x]  Continue per plan of care []  Alter current plan (see comments)  []  Plan of care initiated []  Discharge     Electronically signed by: Marcelino Scot, PT,DPT [phone]

## 2016-11-18 ENCOUNTER — Inpatient Hospital Stay: Admit: 2016-11-18 | Primary: Gerontology

## 2016-11-18 NOTE — Other (Signed)
[facility] [place]  [address], [address]  [place], [place]  [address]  Phone: [phone]  Fax [phone]    Physical Therapy Daily Treatment Note  Date:  11/18/2016    Patient Name:  Molly [name]    DOB:  October 03, 1960  MRN: [id number]  Restrictions/Precautions:  Post op TKR protocol  Medical/Treatment Diagnosis Information:  ?? 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  Insurance/Certification information:  PT Insurance Information: 09/13/16 MED MUT - $50/25 - $1000DED - $0CP - PT/OT MED NEC -90/10% - NO AUTH  Physician Information:  Referring Practitioner: Laurelyn Sickle MD  Plan of care signed (Y/N): N    Date of Patient follow up with Physician: 11/07/16    G-Code (if applicable):      Date G-Code Applied:  10/31/16       Progress Note:   Yes    No  Next due by: Visit #10       Latex Allergy:  NO      YES  Preferred Language for Healthcare:   English       other:    Visit # Insurance Allowable   6 BMN     Pain level: 2 /10     SUBJECTIVE: Patient states she is not relying on the walker much, feels ready to go to a cane.  Is working on exercises consistently at home    OBJECTIVE:   Observation:   Test measurements: knee flexion supine  w/ manuals 111 deg    RESTRICTIONS/PRECAUTIONS: none    Exercises/Interventions:     Therapeutic Ex Sets/reps Notes        Seated HS stretch 3x30 sec HEP   Seated QS 5" x 15HEP   SLR flexion 2 x10  HEP; cues for keeping knee straight   Slidelying ABD  2 x 10 Tactile cues for hip positioning   Supine heel slide w/ strap 10" x 10    Bike  5 min          Bridge w/ add 2 x 10 x 5"    Clamshells Red loop 2 x 10 x 3" Gave band for home   LAQ 3# 2 x 10 x 3" +HEP; fatigue   Mini squats 25x Cues for form, L side slightly behind                            Manual Intervention     STM lat and medial joint  STM distal HS and prox calf  Gentle G1-2 flexion and  extension mobs  PROM knee flex/ext   Manual HS and gastroc stretch 20' Improved ability to relax today                            NMR re-education     Tandem balance 10" x 10 L leg behind   Step ups 4" step BUE 2 x 10    Lateral heel taps 4" 10x Requires cueing                                 Pt education was provided in post op restrictions, precautions, progression of rehab expectations and timeline.x6'    Therapeutic Exercise and NMR EXR  [  x] [phone]) Provided verbal/tactile cueing for activities related to strengthening, flexibility, endurance, ROM for improvements in LE, proximal hip, and core control with self care, mobility, lifting, ambulation.   [phone]) Provided verbal/tactile cueing for activities related to improving balance, coordination, kinesthetic sense, posture, motor skill, proprioception  to assist with LE, proximal hip, and core control in self care, mobility, lifting, ambulation and eccentric single leg control.     NMR and Therapeutic Activities:     [phone] or [phone]) Provided verbal/tactile cueing for activities related to improving balance, coordination, kinesthetic sense, posture, motor skill, proprioception and motor activation to allow for proper function of core, proximal hip and LE with self care and ADLs   (29562) Gait Re-education- Provided training and instruction to the patient for proper LE, core and proximal hip recruitment and positioning and eccentric body weight control with ambulation re-education including up and down stairs     Home Exercise Program:     (97110) Reviewed/Progressed HEP activities related to strengthening, flexibility, endurance, ROM of core, proximal hip and LE for functional self-care, mobility, lifting and ambulation/stair navigation    (97112)Reviewed/Progressed HEP activities related to improving balance, coordination, kinesthetic sense, posture, motor skill, proprioception of core, proximal hip and LE for self care, mobility, lifting, and  ambulation/stair navigation      Manual Treatments:  PROM / STM / Oscillations-Mobs:  G-I, II, III, IV (PA's, Inf., Post.)   (97140) Provided manual therapy to mobilize LE, proximal hip and/or LS spine soft tissue/joints for the purpose of modulating pain, promoting relaxation,  increasing ROM, reducing/eliminating soft tissue swelling/inflammation/restriction, improving soft tissue extensibility and allowing for proper ROM for normal function with self care, mobility, lifting and ambulation.     Modalities:  HV/CP x15' med/let knee long sitting    Charges:  Timed Code Treatment Minutes: 50   Total Treatment Minutes: 65      EVAL (LOW) 97161 (typically 20 minutes face-to-face)   EVAL (MOD) 13086 (typically 30 minutes face-to-face)   EVAL (HIGH) 97163 (typically 45 minutes face-to-face)   RE-EVAL      VH(84696) x  2    IONTO   NMR (97112) x       VASO   Manual (97140) x  1     Other:   TA x        Mech Traction (29528)   ES(attended) (41324)       ES (un) (40102):     GOALS:   Patient stated goal: To get back to work  ??  Therapist goals for Patient:   Short Term Goals: To be achieved in: 2 weeks  1. Independent in HEP and progression per patient tolerance, in order to prevent re-injury.   2. Patient will have a decrease in pain to facilitate improvement in movement, function, and ADLs as indicated by Functional Deficits.  ??  Long Term Goals: To be achieved in: 6 weeks  1. Disability index score of 20% or less for the LEFS to assist with reaching prior level of function.   2. Patient will demonstrate increased AROM to -5 to 125 to allow for proper joint functioning as indicated by patients Functional Deficits.   3. Patient will demonstrate an increase in Strength to good proximal hip strength and control, 4+/5 in LE to allow for proper functional mobility as indicated by patients Functional Deficits.   4. Patient will return to IND ambulation with good gait pattern.  5. Pt will be  able to return to work. (patient  specific functional goal)         Progression Towards Functional goals:   Patient is progressing as expected towards functional goals listed.     Progression is slowed due to complexities listed.   Progression has been slowed due to co-morbidities.   Plan just implemented, too soon to assess goals progression   Other:     ASSESSMENT: Patient progressing very well with motion, provided resistance for clamshells today.    Treatment/Activity Tolerance:   Patient tolerated treatment well  Patient limited by fatique   Patient limited by pain   Patient limited by other medical complications   Other:     Prognosis:  Good  Fair   Poor          Patient Requires Follow-up:  Yes   No    PLAN:ATC NV   Continue per plan of care  Alter current plan (see comments)   Plan of care initiated  Discharge     Electronically signed by: Marcelino Scot, PT,DPT [phone]

## 2016-11-22 ENCOUNTER — Inpatient Hospital Stay: Admit: 2016-11-22 | Attending: Rehabilitative and Restorative Service Providers" | Primary: Gerontology

## 2016-11-22 NOTE — Other (Signed)
[name] - [facility], [place]  [address], [address]  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  Insurance/Certification information:  PT Insurance Information: 09/13/16 MED MUT - $50/25 - $1000DED - $0CP - PT/OT MED NEC -90/10% - NO AUTH  Physician Information:  Referring Practitioner: Laurelyn Sickle MD  Plan of care signed (Y/N): N    Date of Patient follow up with Physician: 11/07/16    G-Code (if applicable):      Date G-Code Applied:  10/31/16       Progress Note: [x]   Yes  []   No  Next due by: Visit #10       Latex Allergy:  [x] NO      [] YES  Preferred Language for Healthcare:   [x] English       [] other:    Visit # Insurance Allowable   7 BMN     Pain level: 2-8 /10     SUBJECTIVE: Patient states she started to have nerve pain in the car ride on the way here.     OBJECTIVE:   Observation:   Test measurements: knee flexion supine  w/ manuals 111 deg    RESTRICTIONS/PRECAUTIONS: none    Exercises/Interventions:     Therapeutic Ex Sets/reps Notes        Seated HS stretch 3x30 sec HEP   HEP   SLR flexion 3 x10  HEP; cues for keeping knee straight   Supine SB rolls FLX 10" x 10 HEP heelslide   Slidelying ABD  2 x 10 Tactile cues for hip positioning        Bike  5 min    Slant board LLE 3x30"        Bridge w/ add 2 x 10 x 5"    LAQ 3# 2 x 10 x 3" +HEP; fatigue   Mini squats 25x Cues for form, L side slightly behind                            Manual Intervention     STM lat and medial joint  STM distal HS and prox calf  Gentle G1-2 flexion and extension mobs  PROM knee flex/ext   Manual HS and gastroc stretch 20' Improved ability  to relax today                            NMR re-education     Tandem balance 10" x 10 L leg behind   Step ups 6" 2 x 10    Cone walking 5 min Cues for knee flexion and heel strike                                 Pt education was provided in post op restrictions, precautions, progression of rehab expectations and timeline.x6'    Therapeutic Exercise and NMR EXR  [x]  [phone]) Provided verbal/tactile cueing for activities related to strengthening, flexibility, endurance, ROM for  improvements in LE, proximal hip, and core control with self care, mobility, lifting, ambulation.  []  [phone]) Provided verbal/tactile cueing for activities related to improving balance, coordination, kinesthetic sense, posture, motor skill, proprioception  to assist with LE, proximal hip, and core control in self care, mobility, lifting, ambulation and eccentric single leg control.     NMR and Therapeutic Activities:    [x]  [phone] or [address]) Provided verbal/tactile cueing for activities related to improving balance, coordination, kinesthetic sense, posture, motor skill, proprioception and motor activation to allow for proper function of core, proximal hip and LE with self care and ADLs  []  (28413) Gait Re-education- Provided training and instruction to the patient for proper LE, core and proximal hip recruitment and positioning and eccentric body weight control with ambulation re-education including up and down stairs     Home Exercise Program:    [x]  [phone]) Reviewed/Progressed HEP activities related to strengthening, flexibility, endurance, ROM of core, proximal hip and LE for functional self-care, mobility, lifting and ambulation/stair navigation   []  (97112)Reviewed/Progressed HEP activities related to improving balance, coordination, kinesthetic sense, posture, motor skill, proprioception of core, proximal hip and LE for self care, mobility, lifting, and ambulation/stair navigation      Manual Treatments:  PROM / STM / Oscillations-Mobs:   G-I, II, III, IV (PA's, Inf., Post.)  [x]  (97140) Provided manual therapy to mobilize LE, proximal hip and/or LS spine soft tissue/joints for the purpose of modulating pain, promoting relaxation,  increasing ROM, reducing/eliminating soft tissue swelling/inflammation/restriction, improving soft tissue extensibility and allowing for proper ROM for normal function with self care, mobility, lifting and ambulation.     Modalities:  HV/CP x15' med/let knee long sitting    Charges:  Timed Code Treatment Minutes: 50   Total Treatment Minutes: 65     []  EVAL (LOW) 97161 (typically 20 minutes face-to-face)  []  EVAL (MOD) 02725 (typically 30 minutes face-to-face)  []  EVAL (HIGH) 97163 (typically 45 minutes face-to-face)  []  RE-EVAL     [x]  DG(64403) x  2   []  IONTO  []  NMR (97112) x      []  VASO  [x]  Manual (97140) x  1    []  Other:  []  TA x       []  Mech Traction (47425)  []  ES(attended) (95638)      [x]  ES (un) (75643):     GOALS:   Patient stated goal: To get back to work    Therapist goals for Patient:   Short Term Goals: To be achieved in: 2 weeks  1. Independent in HEP and progression per patient tolerance, in order to prevent re-injury.   2. Patient will have a decrease in pain to facilitate improvement in movement, function, and ADLs as indicated by Functional Deficits.    Long Term Goals: To be achieved in: 6 weeks  1. Disability index score of 20% or less for the LEFS to assist with reaching prior level of function.   2. Patient will demonstrate increased AROM to -5 to 125 to allow for proper joint functioning as indicated by patients Functional Deficits.   3. Patient will demonstrate an increase in Strength to good proximal hip strength and control, 4+/5 in LE to allow for proper functional mobility as indicated by patients Functional Deficits.   4. Patient will return to IND ambulation with good gait pattern.  5. Pt will be able to return to work. (patient specific functional goal)         Progression Towards  Functional  goals:  [x]  Patient is progressing as expected towards functional goals listed.    []  Progression is slowed due to complexities listed.  []  Progression has been slowed due to co-morbidities.  []  Plan just implemented, too soon to assess goals progression  []  Other:     ASSESSMENT: Patient did well with inc reps and resistance today.  Fatigued with standing activities at end, did not see ATC today d/t fatigue.    Treatment/Activity Tolerance:  [x]  Patient tolerated treatment well []  Patient limited by fatique  []  Patient limited by pain  []  Patient limited by other medical complications  []  Other:     Prognosis: [x]  Good []  Fair  []  Poor          Patient Requires Follow-up: [x]  Yes  []  No    PLAN:ATC NV  [x]  Continue per plan of care []  Alter current plan (see comments)  []  Plan of care initiated []  Discharge     Electronically signed by: Marcelino Scot, PT,DPT [phone]

## 2016-11-25 ENCOUNTER — Inpatient Hospital Stay: Admit: 2016-11-25 | Primary: Gerontology

## 2016-11-25 NOTE — Other (Signed)
[facility] - [facility], [place]  [address]  [place], [place]  [address]  Phone: [phone]  Fax [phone]    Physical Therapy Daily Treatment Note  Date:  11/25/2016    Patient Name:  Molly [name]    DOB:  [birth year]  MRN: [id number]  Restrictions/Precautions:  Post op TKR protocol  Medical/Treatment Diagnosis Information:  ?? 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  Insurance/Certification information:  PT Insurance Information: 09/13/16 MED MUT - $50/25 - $1000DED - $0CP - PT/OT MED NEC -90/10% - NO AUTH  Physician Information:  Referring Practitioner: Laurelyn Sickle MD  Plan of care signed (Y/N): N    Date of Patient follow up with Physician: 11/07/16    G-Code (if applicable):      Date G-Code Applied:  10/31/16       Progress Note:   Yes    No  Next due by: Visit #10       Latex Allergy:  NO      YES  Preferred Language for Healthcare:   English       other:    Visit # Insurance Allowable   8 BMN     Pain level: 0 /10     SUBJECTIVE: Patient states her knee feels good today, feels like she should be further along.    OBJECTIVE:   Observation:   Test measurements: Knee flexion w/ heel slide 115 deg    RESTRICTIONS/PRECAUTIONS: none    Exercises/Interventions:     Therapeutic Ex Sets/reps Notes        Seated HS stretch 3x30 sec HEP   HEP   SLR flexion 3 x10  HEP; cues for keeping knee straight   Supine SB rolls FLX 10" x 10 HEP heelslide   Slidelying ABD  2 x 10 Tactile cues for hip positioning        Bike  5 min    Slant board LLE 3x30"        Bridge on SB 2 x 10 x 5"    LAQ 3# 2 x 13 x 3" +   Mini squats 25x Improved form, good equal weight bearing   LBW OVL 3x 10 ft New 4/6   Leg press 60# 2 x 10 New 4/6                  Manual Intervention     STM lat and medial joint  STM distal HS and prox calf  Gentle G1-2 flexion and extension mobs  PROM knee flex/ext   Manual HS and  gastroc stretch 20' Improved ability to relax today                            NMR re-education     SL balance LLE 10" x 10 Able to perform w/ good stability   Step ups 6" 2 x 10    Gait training w/o cane 5' Cues for heel strike and knee flexion, overall good stability for short distance.                                 Pt education was provided in post op restrictions, precautions, progression of rehab expectations and timeline.x6'    Therapeutic Exercise and NMR EXR  [  x] [phone]) Provided verbal/tactile cueing for activities related to strengthening, flexibility, endurance, ROM for improvements in LE, proximal hip, and core control with self care, mobility, lifting, ambulation.   [phone]) Provided verbal/tactile cueing for activities related to improving balance, coordination, kinesthetic sense, posture, motor skill, proprioception  to assist with LE, proximal hip, and core control in self care, mobility, lifting, ambulation and eccentric single leg control.     NMR and Therapeutic Activities:     [phone] or 21308) Provided verbal/tactile cueing for activities related to improving balance, coordination, kinesthetic sense, posture, motor skill, proprioception and motor activation to allow for proper function of core, proximal hip and LE with self care and ADLs   (65784) Gait Re-education- Provided training and instruction to the patient for proper LE, core and proximal hip recruitment and positioning and eccentric body weight control with ambulation re-education including up and down stairs     Home Exercise Program:     [phone]) Reviewed/Progressed HEP activities related to strengthening, flexibility, endurance, ROM of core, proximal hip and LE for functional self-care, mobility, lifting and ambulation/stair navigation    (97112)Reviewed/Progressed HEP activities related to improving balance, coordination, kinesthetic sense, posture, motor skill, proprioception of core, proximal hip and LE for self care,  mobility, lifting, and ambulation/stair navigation      Manual Treatments:  PROM / STM / Oscillations-Mobs:  G-I, II, III, IV (PA's, Inf., Post.)   (97140) Provided manual therapy to mobilize LE, proximal hip and/or LS spine soft tissue/joints for the purpose of modulating pain, promoting relaxation,  increasing ROM, reducing/eliminating soft tissue swelling/inflammation/restriction, improving soft tissue extensibility and allowing for proper ROM for normal function with self care, mobility, lifting and ambulation.     Modalities:  CP 15 min    Charges:  Timed Code Treatment Minutes: 60   Total Treatment Minutes: 75      EVAL (LOW) 97161 (typically 20 minutes face-to-face)   EVAL (MOD) 52841 (typically 30 minutes face-to-face)   EVAL (HIGH) 97163 (typically 45 minutes face-to-face)   RE-EVAL      LK(44010) x  2    IONTO   NMR (97112) x  1    VASO   Manual (97140) x  1     Other:   TA x        Mech Traction (27253)   ES(attended) (66440)       ES (un) (34742):     GOALS:   Patient stated goal: To get back to work  ??  Therapist goals for Patient:   Short Term Goals: To be achieved in: 2 weeks  1. Independent in HEP and progression per patient tolerance, in order to prevent re-injury.   2. Patient will have a decrease in pain to facilitate improvement in movement, function, and ADLs as indicated by Functional Deficits.  ??  Long Term Goals: To be achieved in: 6 weeks  1. Disability index score of 20% or less for the LEFS to assist with reaching prior level of function.   2. Patient will demonstrate increased AROM to -5 to 125 to allow for proper joint functioning as indicated by patients Functional Deficits.   3. Patient will demonstrate an increase in Strength to good proximal hip strength and control, 4+/5 in LE to allow for proper functional mobility as indicated by patients Functional Deficits.   4. Patient will return to IND ambulation with good gait pattern.  5. Pt will be able  to return to work. (patient specific functional goal)  Progression Towards Functional goals:   Patient is progressing as expected towards functional goals listed.     Progression is slowed due to complexities listed.   Progression has been slowed due to co-morbidities.   Plan just implemented, too soon to assess goals progression   Other:     ASSESSMENT: Patient doing well with all TE at this point in time.  Has increased confidence in her progression today, discussed d/c the cane for short distances in her home.  Should be ready to try more weight machines next visit    Treatment/Activity Tolerance:   Patient tolerated treatment well  Patient limited by fatique   Patient limited by pain   Patient limited by other medical complications   Other:     Prognosis:  Good  Fair   Poor          Patient Requires Follow-up:  Yes   No    PLAN:ATC NV   Continue per plan of care  Alter current plan (see comments)   Plan of care initiated  Discharge     Electronically signed by: Marcelino Scot, PT,DPT [phone]

## 2016-11-28 ENCOUNTER — Ambulatory Visit
Admit: 2016-11-28 | Discharge: 2016-11-28 | Payer: PRIVATE HEALTH INSURANCE | Attending: Orthopaedic Surgery | Primary: Gerontology

## 2016-11-28 DIAGNOSIS — Z96652 Presence of left artificial knee joint: Secondary | ICD-10-CM

## 2016-11-28 NOTE — Progress Notes (Signed)
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  Patient is 5 weeks from surgery.  She is continuing to participate in physical therapy.  She has been putting Neosporin on the wound because of some mild irritation at suture site.    Pain Assessment  Location of Pain: Knee  Location Modifiers: Left  Severity of Pain: 2  Frequency of Pain: Intermittent  Aggravating Factors:  (sleeping)  Limiting Behavior: Some  Result of Injury: No  Work-Related Injury: No  Are there other pain locations you wish to document?: No]    Physical examination: The incision is clean, dry, and intact with no drainage or signs of infection.  There is some mild erythema isolated over the area of her sutures were removed.  Range of motion is 0 degrees of extension to 110 degrees of flexion. There is expected swelling with no evidence of DVT and a negative Homans sign. Neurovascular exam is intact.      Assessment/plan: We would like to continue outpatient physical therapy to restore range of motion and strength. The patient was given local wound care instructions. We did not refill their pain medication today. We will followup in 4 weeks for reevaluation.

## 2016-11-28 NOTE — Patient Instructions (Signed)
Management discussed and patient voiced understanding.  They were instructed to follow up with their PCP regarding any abnormal vitals reading.

## 2016-11-29 ENCOUNTER — Inpatient Hospital Stay: Admit: 2016-11-29 | Primary: Gerontology

## 2016-11-29 NOTE — Other (Signed)
[facility] - [facility], [place]  [address], [address]  [place], [place]  [address]  Phone: [phone]  Fax [phone]    Physical Therapy Daily Treatment Note  Date:  11/29/2016    Patient Name:  Molly [name]    DOB:  1961-07-04  MRN: [id number]  Restrictions/Precautions:  Post op TKR protocol  Medical/Treatment Diagnosis Information:  ?? 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  Insurance/Certification information:  PT Insurance Information: 09/13/16 MED MUT - $50/25 - $1000DED - $0CP - PT/OT MED NEC -90/10% - NO AUTH  Physician Information:  Referring Practitioner: Laurelyn Sickle MD  Plan of care signed (Y/N): N    Date of Patient follow up with Physician: 11/07/16    G-Code (if applicable):      Date G-Code Applied:  10/31/16       Progress Note:   Yes    No  Next due by: Visit #10       Latex Allergy:  NO      YES  Preferred Language for Healthcare:   English       other:    Visit # Insurance Allowable   9 BMN     Pain level: 0 /10     SUBJECTIVE: Patient states she saw the doctor yesterday who said he doesn't want to see her again for another year, up to therapist on duration of therapy.      OBJECTIVE:   Observation:   Test measurements: Knee flexion w/ heel slide 115 deg    RESTRICTIONS/PRECAUTIONS: none    Exercises/Interventions:     Therapeutic Ex Sets/reps Notes        Seated HS stretch 3x30 sec HEP   HEP   SLR flexion 1# 3 x10  HEP; Added wt on 4/10   Supine SB rolls FLX 10" x 10 HEP heelslide   Slidelying ABD 1# 2 x 10 Tactile cues for hip positioning; added wt on 4/10        Bike  5 min    Slant board LLE 3x30"        Bridge on SB 2 x 10 x 5"    SAQ 3# 2 x10 x 3" New 4/10   Squats w/ OVL 25x Improved form, good equal weight bearing   LBW OVL 3x 10 ft New 4/6             ATC See AT note Started 4/10        Manual Intervention     STM lat and medial joint  STM distal HS and prox  calf  Prone quad stretch 13'                             NMR re-education     SL balance LLE 10" x 10 Required fingertips for balance today                                           Pt education was provided in post op restrictions, precautions, progression of rehab expectations and timeline.x6'    Therapeutic Exercise and NMR EXR   [phone]) Provided verbal/tactile cueing for activities related to strengthening, flexibility, endurance, ROM for improvements in LE, proximal hip, and core  control with self care, mobility, lifting, ambulation.   [phone]) Provided verbal/tactile cueing for activities related to improving balance, coordination, kinesthetic sense, posture, motor skill, proprioception  to assist with LE, proximal hip, and core control in self care, mobility, lifting, ambulation and eccentric single leg control.     NMR and Therapeutic Activities:     [phone] or 09811) Provided verbal/tactile cueing for activities related to improving balance, coordination, kinesthetic sense, posture, motor skill, proprioception and motor activation to allow for proper function of core, proximal hip and LE with self care and ADLs   (91478) Gait Re-education- Provided training and instruction to the patient for proper LE, core and proximal hip recruitment and positioning and eccentric body weight control with ambulation re-education including up and down stairs     Home Exercise Program:     (97110) Reviewed/Progressed HEP activities related to strengthening, flexibility, endurance, ROM of core, proximal hip and LE for functional self-care, mobility, lifting and ambulation/stair navigation    (97112)Reviewed/Progressed HEP activities related to improving balance, coordination, kinesthetic sense, posture, motor skill, proprioception of core, proximal hip and LE for self care, mobility, lifting, and ambulation/stair navigation      Manual Treatments:  PROM / STM / Oscillations-Mobs:  G-I, II, III, IV (PA's, Inf.,  Post.)   (97140) Provided manual therapy to mobilize LE, proximal hip and/or LS spine soft tissue/joints for the purpose of modulating pain, promoting relaxation,  increasing ROM, reducing/eliminating soft tissue swelling/inflammation/restriction, improving soft tissue extensibility and allowing for proper ROM for normal function with self care, mobility, lifting and ambulation.     Modalities:  CP 15 min    Charges:  Timed Code Treatment Minutes: 50   Total Treatment Minutes: 65      EVAL (LOW) 97161 (typically 20 minutes face-to-face)   EVAL (MOD) 29562 (typically 30 minutes face-to-face)   EVAL (HIGH) 97163 (typically 45 minutes face-to-face)   RE-EVAL      ZH(08657) x  2    IONTO   NMR (97112) x       VASO   Manual (97140) x  1     Other:   TA x        Mech Traction (84696)   ES(attended) (29528)       ES (un) (41324):     GOALS:   Patient stated goal: To get back to work  ??  Therapist goals for Patient:   Short Term Goals: To be achieved in: 2 weeks  1. Independent in HEP and progression per patient tolerance, in order to prevent re-injury.   2. Patient will have a decrease in pain to facilitate improvement in movement, function, and ADLs as indicated by Functional Deficits.  ??  Long Term Goals: To be achieved in: 6 weeks  1. Disability index score of 20% or less for the LEFS to assist with reaching prior level of function.   2. Patient will demonstrate increased AROM to -5 to 125 to allow for proper joint functioning as indicated by patients Functional Deficits.   3. Patient will demonstrate an increase in Strength to good proximal hip strength and control, 4+/5 in LE to allow for proper functional mobility as indicated by patients Functional Deficits.   4. Patient will return to IND ambulation with good gait pattern.  5. Pt will be able to return to work. (patient specific functional goal)         Progression Towards Functional goals:   Patient is progressing as expected  towards functional  goals listed.     Progression is slowed due to complexities listed.   Progression has been slowed due to co-morbidities.   Plan just implemented, too soon to assess goals progression   Other:     ASSESSMENT: Patient's motion is progressing well, at 117 deg flexion and 0 deg extension today.  ATC from Dr. Mearl Latin office came over to check her incision and cleared her to go back to water aerobics.       Treatment/Activity Tolerance:   Patient tolerated treatment well  Patient limited by fatique   Patient limited by pain   Patient limited by other medical complications   Other:     Prognosis:  Good  Fair   Poor          Patient Requires Follow-up:  Yes   No    PLAN: Update POC NV (10th vis note)   Continue per plan of care  Alter current plan (see comments)   Plan of care initiated  Discharge     Electronically signed by: Marcelino Scot, PT,DPT [phone]

## 2016-11-29 NOTE — Other (Signed)
[name] - [facility], [place]  [address]  [place], [place]  [address]  Phone: [phone]  Fax [phone]      ATHLETIC TRAINING AREA ACTIVITY SHEET  Date:  11/29/2016    Patient Name:  [name]    DOB:  Jan 04, 1961  MRN: [id number]  Restrictions/Precautions:    Medical/Treatment Diagnosis Information:  ?? 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  Physician Information:    Laurelyn Sickle MD    Weeks Post-op  8 wks  12 wks 16 wks 20 wks   24 wks                            Activity Log                                                  DOS/DOI:                                                    Date: 11/29/16    ATC communication Needs limb confidence  HEP glut sqz w/ amb   Bike    Elliptical    Treadmill    Airdyne        Gastroc stretch    Soleus stretch    Hamstring stretch    ITB stretch    Hip Flexor stretch    Quad stretch    Adductor stretch        Weight Shifting sp Gait train - VC glut sqz                             fp                              tp    Lateral walking (with/w/o TB)        Balance: PEP/Dyna board                   SLS          Star excursion load/explode          Extremity reach UE/LE        Leg Press Bil. 80# 3x10 w/ add                     Ecc. 60# 2x10 w/ add                     Inv.        Calf Press Bil. 60# 2x10 w/ add                      Ecc.                        Inv.        [name]   Flex               ABd 30#  R/L 2x10              ADd              TKE 45# 20x5"              Ext        Steps Up               Up and Over               Down               Lateral               Rotation        Squats  mini                  wall                 BOSU         Lunges:  Lunge to Balance                   Balance to Lunge                   Walking        Knee Extension Bilat.                                               Ecc.                               Inv.         Hamstring Curls Bilat.                               Ecc.                               Inv.        Soleus Press Bilat.                           Ecc.                           Inv.                            Ladders                Square               Jump/Hop  Low                      Med.                      High  Modality CP 15'   Initials                             DB   Time spent with PT assistant

## 2016-12-02 ENCOUNTER — Inpatient Hospital Stay: Admit: 2016-12-02 | Primary: Gerontology

## 2016-12-02 NOTE — Other (Signed)
[facility] - [facility], [place]  [address]  [place], [place]  [address]  Phone: [phone]  Fax [phone]      ATHLETIC TRAINING AREA ACTIVITY SHEET  Date:  12/02/2016    Patient Name:  Molly [name]    DOB:  May 16, 1961  MRN: [id number]  Restrictions/Precautions:    Medical/Treatment Diagnosis Information:  ?? 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  Physician Information:    Laurelyn Sickle MD    Weeks Post-op  8 wks  12 wks 16 wks 20 wks   24 wks                            Activity Log                                                  DOS/DOI:                                                    Date: 11/29/16  12/02/16    ATC communication Needs limb confidence  HEP glut sqz w/ amb    Bike     Elliptical     Treadmill     Airdyne          Gastroc stretch     Soleus stretch     Hamstring stretch     ITB stretch     Hip Flexor stretch     Quad stretch     Adductor stretch          Weight Shifting sp Gait train - VC glut sqz                              fp                               tp     Lateral walking (with/w/o TB)          Balance: PEP/Dyna board                    SLS  SL 5x10"          Star excursion load/explode           Extremity reach UE/LE          Leg Press Bil. 80# 3x10 w/ add 80# 3x10                      Ecc. 60# 2x10 w/ add 70# 2x10                      Inv.          Calf Press Bil. 60# 2x10 w/ add 80# 3x10                       Ecc.  Inv.          MAH   Flex                ABd 30# R/L 2x10 30# R/L 2x10               ADd               TKE 45# 20x5" 60# 20x5"               Ext  60# R/L 2x10         Steps Up  6" FSU 15x               Up and Over                Down                Lateral                Rotation          Squats  mini  w/PT                  wall                  BOSU           Lunges:  Lunge to Balance                    Balance to Lunge                     Walking       OVL lateral band walks.half wall to leg press 3x    Knee Extension Bilat.                                                Ecc.                                Inv.          Hamstring Curls Bilat.                                Ecc.                                Inv.          Soleus Press Bilat.                            Ecc.                            Inv.                               Ladders                 Square                Jump/Hop  Low  Med.                       High                                                               Modality CP 15' CP 15'   Initials                             DB SA    Time spent with PT assistant

## 2016-12-02 NOTE — Plan of Care (Signed)
[facility] - [facility], [place]  [address]  [place], [place]  [address]  Phone: [phone]  Fax [phone]     Physical Therapy Re-Certification Plan of Care    Dear Dr. Tami Ribas  ,    We had the pleasure of treating the following patient for physical therapy services at [facility].  A summary of our findings can be found in the updated assessment below.  This includes our plan of care.  If you have any questions or concerns regarding these findings, please do not hesitate to contact me at the office phone number checked above.  Thank you for the referral.     Physician Signature:________________________________Date:__________________  By signing above, therapist???s plan is approved by physician      Patient: Molly [name]   DOB: Jan 14, 1961   MRN: [id number]  Referring Physician:        Evaluation Date: 12/02/2016      Medical Diagnosis Information:  ??   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     Insurance information:  [employer]    Date Range:10/31/16-12/01/16  Total visits:10      G-Codes: (if applicable) PT G-Codes  Functional Assessment Tool Used: LEFS  Score: 46%  Functional Limitation: Mobility: Walking and moving around  Mobility: Walking and Moving Around Current Status [phone]): At least 40 percent but less than 60 percent impaired, limited or restricted  Mobility: Walking and Moving Around Goal Status [phone]): At least 20 percent but less than 40 percent impaired, limited or restricted   Functional Index used:    SUBJECTIVE: Pt reports her knee just is a little sore on the inside portion, otherwise feels like it is getting better.  Does get tired pretty quickly when up and moving.  Would like to start weaning back into work.    Current Pain Scale: 0/10    Type: Constant   Intermitment  Radiating Localized  other:     Functional Limitations:  Sitting Standing Walking    Squatting Stairs            ADL's  Driving Sports/Recreation  Other:      OBJECTIVE:           Gait: Ambulates with quad cane majority of time, without has decreased stance time on left leg and mild trendelenberg.    Joint mobility   Normal    Hypo   Hyper    Palpation: No TTP, mild tightness just proximal to the knee joint but is improving        ASSESSMENT: Patient is progressing very well following her total knee replacement.  Her motion and strength have improved and her balance is improving as well.  She continues to benefit from physical therapy in order to continue to increase strength, address gait mechanics and endurance for return to pain/restriction free work related activities.      Response to Treatment:   Patient is responding well to treatment and improvement is noted with regards  to goals   Patient should continue to improve in reasonable time if they continue HEP   Patient has plateaued and is no longer responding to skilled PT intervention    Patient is getting worse and would benefit from return to referring MD   Patient unable to adhere to initial POC    Functional deficiencies/Impairements which affect  ADL's and Reduce overall function:     decreased core/proximal hip strength and neuromuscular control - Reduced  overall function   decreased LE ROM/joint mobility- Reduced overall Function    decreased LE strength- Reduced overall function with gait and steps   difficulty with SLS- Reduced overall function and possible falls risk   pain/difficulty with ambulation- reduced overall function and mobility   unable to perform sport/recreational activity due to pain and dysfunction   other:       Prognosis/Rehab Potential:    Excellent   Good    Fair   Poor:    Toleration of evaluation or treatment:    Excellent   Good    Fair   Poor     New or Updated Goals (if applicable):   No change to goals  established upon initial eval/last progress note:  New Goals:    GOALS:  Patient stated goal: To get back to work  ??  Therapist goals for Patient:   Short Term Goals: To be achieved in: 2 weeks  1. Independent in HEP and progression per patient tolerance, in order to prevent re-injury.   2.??Patient will have a decrease in pain to facilitate improvement in movement, function, and ADLs as indicated by Functional Deficits.  ??  Long Term Goals: To be achieved in: 6??weeks  1. Disability index score of 20% or less for the LEFS??to assist with reaching prior level of function. progress  2. Patient will demonstrate increased AROM to -5 to 125??to allow for proper joint functioning as indicated by patients Functional Deficits. progressing  3. Patient will demonstrate an increase in Strength to??good proximal hip strength and control, 4+/5 in LE??to allow for proper functional mobility as indicated by patients Functional Deficits. progressing  4. Patient will return to IND ambulation with good gait pattern. Progression, using quad cane for long distances, independent ambulation for short distances  5. Pt will be able to return to work. (patient specific functional goal) progress     Rehab Potential:   Excellent    Good    Fair    Poor    Plan of Care:   Continue Current Therapy Intervention    Frequency/Duration: 1-2 days per week for 3-4 Weeks:  HEP instruction: See flowsheets and media  1. Ther ex including: strength training, ROM, NMR and proprioception for the proximal hip, core and Lower extremity  2. Manual therapy as indicated including Dry Needling/IASTM, STM, PROM, Gr I-IV mobilizations, spinal mobilization/manipulation.   3. Modalities as needed including: thermal agents, E-stim, [place], iontophoresis as indicated.   4. Patient education on joint protection, activity modification, progression of HEP.        Electronically signed by:  Marcelino Scot, PT,DPT [phone]

## 2016-12-02 NOTE — Other (Addendum)
[facility] - [facility], [place]  [address]  [place], [place]  [address]  Phone: [phone]  Fax [phone]    Physical Therapy Daily Treatment Note  Date:  12-16-16    Patient Name:  Molly [name]    DOB:  [birth year]  MRN: [id number]  Restrictions/Precautions:  Post op TKR protocol  Medical/Treatment Diagnosis Information:  ?? 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  Insurance/Certification information:  PT Insurance Information: 09/13/16 MED MUT - $50/25 - $1000DED - $0CP - PT/OT MED NEC -90/10% - NO AUTH  Physician Information:  Referring Practitioner: Laurelyn Sickle MD  Plan of care signed (Y/N): N    Date of Patient follow up with Physician: 11/07/16    G-Code (if applicable):      Date G-Code Applied:  12/16/2016  PT G-Codes  Functional Assessment Tool Used: LEFS  Score: 46%  Functional Limitation: Mobility: Walking and moving around  Mobility: Walking and Moving Around Current Status (N6295): At least 40 percent but less than 60 percent impaired, limited or restricted  Mobility: Walking and Moving Around Goal Status [phone]): At least 20 percent but less than 40 percent impaired, limited or restricted    Progress Note:   Yes    No  Next due by: Visit #10       Latex Allergy:  NO      YES  Preferred Language for Healthcare:   English       other:    Visit # Insurance Allowable   10 BMN     Pain level: 0 /10     SUBJECTIVE: Patient states her knee feels good, yesterday had some pain in the inside of the knee.  Feels balance is where she has issues when she walks for a longer distance.     OBJECTIVE:   Observation:   Test measurements: Knee flexion w/ heel slide 115 deg    RESTRICTIONS/PRECAUTIONS: none    Exercises/Interventions:     Therapeutic Ex Sets/reps Notes        Seated HS stretch 3x30 sec HEP   HEP   Heelslide 10" x 10 HEP heelslide   Slidelying ABD 1# 2 x 10 Tactile cues  for hip positioning; added wt on 4/10   Bridge 5" x 20    Bike  5 min    Slant board LLE 3x30"        Bridge on SB 25 x 5"    Squats w/ OVL 25x Improved form, good equal weight bearing             ATC See AT note Started 4/10   Orthovitals re-assessment 20 min    Manual Intervention     STM lat and medial joint  STM distal HS and prox calf  Prone quad stretch 13'                             NMR re-education                                             Pt education was provided in post op restrictions, precautions, progression of rehab expectations and timeline.x6'    Therapeutic Exercise and NMR EXR   [phone])  Provided verbal/tactile cueing for activities related to strengthening, flexibility, endurance, ROM for improvements in LE, proximal hip, and core control with self care, mobility, lifting, ambulation.   [phone]) Provided verbal/tactile cueing for activities related to improving balance, coordination, kinesthetic sense, posture, motor skill, proprioception  to assist with LE, proximal hip, and core control in self care, mobility, lifting, ambulation and eccentric single leg control.     NMR and Therapeutic Activities:     [phone] or [id number]) Provided verbal/tactile cueing for activities related to improving balance, coordination, kinesthetic sense, posture, motor skill, proprioception and motor activation to allow for proper function of core, proximal hip and LE with self care and ADLs   (91478) Gait Re-education- Provided training and instruction to the patient for proper LE, core and proximal hip recruitment and positioning and eccentric body weight control with ambulation re-education including up and down stairs     Home Exercise Program:     (29562) Reviewed/Progressed HEP activities related to strengthening, flexibility, endurance, ROM of core, proximal hip and LE for functional self-care, mobility, lifting and ambulation/stair navigation    (97112)Reviewed/Progressed HEP activities related to  improving balance, coordination, kinesthetic sense, posture, motor skill, proprioception of core, proximal hip and LE for self care, mobility, lifting, and ambulation/stair navigation      Manual Treatments:  PROM / STM / Oscillations-Mobs:  G-I, II, III, IV (PA's, Inf., Post.)   (97140) Provided manual therapy to mobilize LE, proximal hip and/or LS spine soft tissue/joints for the purpose of modulating pain, promoting relaxation,  increasing ROM, reducing/eliminating soft tissue swelling/inflammation/restriction, improving soft tissue extensibility and allowing for proper ROM for normal function with self care, mobility, lifting and ambulation.     Modalities:  CP 15 min    Charges:  Timed Code Treatment Minutes: 50   Total Treatment Minutes: 65      EVAL (LOW) 97161 (typically 20 minutes face-to-face)   EVAL (MOD) 13086 (typically 30 minutes face-to-face)   EVAL (HIGH) 97163 (typically 45 minutes face-to-face)   RE-EVAL      VH(84696) x  2    IONTO   NMR (97112) x       VASO   Manual (97140) x  1     Other:   TA x        [employer] (29528)   ES(attended) (41324)       ES (un) (40102):     GOALS:   Patient stated goal: To get back to work  ??  Therapist goals for Patient:   Short Term Goals: To be achieved in: 2 weeks  1. Independent in HEP and progression per patient tolerance, in order to prevent re-injury.   2. Patient will have a decrease in pain to facilitate improvement in movement, function, and ADLs as indicated by Functional Deficits.  ??  Long Term Goals: To be achieved in: 6 weeks  1. Disability index score of 20% or less for the LEFS to assist with reaching prior level of function.   2. Patient will demonstrate increased AROM to -5 to 125 to allow for proper joint functioning as indicated by patients Functional Deficits. progressing  3. Patient will demonstrate an increase in Strength to good proximal hip strength and control, 4+/5 in LE to allow for proper functional  mobility as indicated by patients Functional Deficits. progressing  4. Patient will return to IND ambulation with good gait pattern. Progression, using quad cane for long distances, independent ambulation for short distances  5. Pt will  be able to return to work. (patient specific functional goal)   progress      Progression Towards Functional goals:   Patient is progressing as expected towards functional goals listed.     Progression is slowed due to complexities listed.   Progression has been slowed due to co-morbidities.   Plan just implemented, too soon to assess goals progression   Other:     ASSESSMENT: See updated POC ; limited TE progression today d/t reassessment for orthovitals    Treatment/Activity Tolerance:   Patient tolerated treatment well  Patient limited by fatique   Patient limited by pain   Patient limited by other medical complications   Other:     Prognosis:  Good  Fair   Poor          Patient Requires Follow-up:  Yes   No    PLAN: cont strengthening, endurance and balance work.   Continue per plan of care  Alter current plan (see comments)   Plan of care initiated  Discharge     Electronically signed by: Marcelino Scot, PT,DPT [phone]

## 2016-12-06 ENCOUNTER — Inpatient Hospital Stay: Admit: 2016-12-06 | Primary: Gerontology

## 2016-12-06 NOTE — Other (Signed)
[name] - [facility], [place]  [address]  [place], [place]  [address]  Phone: [phone]  Fax [phone]      ATHLETIC TRAINING AREA ACTIVITY SHEET  Date:  12/06/2016    Patient Name:  Molly [name]    DOB:  Jan 13, 1961  MRN: [id number]  Restrictions/Precautions:    Medical/Treatment Diagnosis Information:  ?? 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  Physician Information:    Laurelyn Sickle MD    Weeks Post-op  8 wks  12 wks 16 wks 20 wks   24 wks                            Activity Log                                                  DOS/DOI:                                                    Date: 11/29/16  12/02/16  12/06/16   ATC communication Needs limb confidence  HEP glut sqz w/ amb     Bike      Elliptical      Treadmill      Airdyne            Gastroc stretch      Soleus stretch      Hamstring stretch      ITB stretch      Hip Flexor stretch      Quad stretch      Adductor stretch            Weight Shifting sp Gait train - VC glut sqz                               fp                                tp      Lateral walking (with/w/o TB)            Balance: PEP/Dyna board                     SLS  SL 5x10"  w/PT         Star excursion load/explode            Extremity reach UE/LE            Leg Press Bil. 80# 3x10 w/ add 80# 3x10  80# 3x10                     Ecc. 60# 2x10 w/ add 70# 2x10  70# 2x10                     Inv.            Calf Press Bil. 60# 2x10 w/ add 80# 3x10  80# 3x10  Ecc.                          Inv.            MAH   Flex                 ABd 30# R/L 2x10 30# R/L 2x10  30# R/L 2x12              ADd                TKE 45# 20x5" 60# 20x5"  60# 20x5"              Ext  60# R/L 2x10  60# R/L 2x12         Steps Up  6" FSU 15x  w/PT              Up and Over                 Down                 Lateral                 Rotation            Squats  mini  w/PT                    wall                   BOSU             Lunges:  Lunge to Balance                     Balance to Lunge                     Walking        OVL lateral band walks.half wall to leg press 3x  w/PT   Knee Extension Bilat.                                                 Ecc.                                 Inv.            Hamstring Curls Bilat.   30# 2x10                              Ecc.                                 Inv.            Soleus Press Bilat.                             Ecc.                             Inv.  Ladders                  Square                 Jump/Hop  Low                        Med.                        High                                                                  Modality CP 15' CP 15' CP 15'   Initials                             DB SA  KRT   Time spent with PT assistant   17'

## 2016-12-06 NOTE — Other (Signed)
[facility] - [facility], [place]  [address]  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  Insurance/Certification information:  PT Insurance Information: 09/13/16 MED MUT - $50/25 - $1000DED - $0CP - PT/OT MED NEC -90/10% - NO AUTH  Physician Information:  Referring Practitioner: Laurelyn Sickle MD  Plan of care signed (Y/N): N    Date of Patient follow up with Physician:     G-Code (if applicable):      Date G-Code Applied:  12/02/16       Progress Note: []   Yes  [x]   No  Next due by: Visit #10       Latex Allergy:  [x] NO      [] YES  Preferred Language for Healthcare:   [x] English       [] other:    Visit # Insurance Allowable   11 BMN     Pain level: 0 /10     SUBJECTIVE: Patient states she feels like her balance is getting better.    OBJECTIVE:   Observation: Pt arrived without cane, ambulation with good mechanics   Test measurements: Knee flexion w/ heel slide    RESTRICTIONS/PRECAUTIONS: none    Exercises/Interventions:     Therapeutic Ex Sets/reps Notes        Seated HS stretch 3x30 sec HEP   Incline stretch 30" x 3         SL hip neutral forward/backward oscillations 1.5#     Heelslide 10" x 10 HEP   Slidelying ABD 1.5# 2 x 10 Tactile cues for hip positioning; added wt on 4/10   Bridge 5" x 20    Bike  5 min              Bridge on SB 25 x 5"    Squats w/ GVL 25x Improved form, good equal weight bearing   LBW GVL 3x 20 ft Inc wt 4/17             ATC See AT note Started 4/10   Orthovitals re-assessment 20 min    Manual Intervention     STM lat and medial joint  STM distal HS and prox calf  Prone quad stretch  PROM flex  8' 118 deg flex                            NMR re-education     SL balance LLE 5" x 10 New 4/17   FSU and LSU 20x ea New 4/17                                      Pt education was provided in post op restrictions, precautions, progression of rehab expectations and timeline.x6'    Therapeutic Exercise and NMR EXR  [x]  [phone]) Provided verbal/tactile cueing for activities related to strengthening, flexibility, endurance, ROM for improvements in LE, proximal hip, and core control with self care, mobility, lifting, ambulation.  []  [phone]) Provided  verbal/tactile cueing for activities related to improving balance, coordination, kinesthetic sense, posture, motor skill, proprioception  to assist with LE, proximal hip, and core control in self care, mobility, lifting, ambulation and eccentric single leg control.     NMR and Therapeutic Activities:    [x]  [phone] or [phone]) Provided verbal/tactile cueing for activities related to improving balance, coordination, kinesthetic sense, posture, motor skill, proprioception and motor activation to allow for proper function of core, proximal hip and LE with self care and ADLs  []  (55732) Gait Re-education- Provided training and instruction to the patient for proper LE, core and proximal hip recruitment and positioning and eccentric body weight control with ambulation re-education including up and down stairs     Home Exercise Program:    [x]  (97110) Reviewed/Progressed HEP activities related to strengthening, flexibility, endurance, ROM of core, proximal hip and LE for functional self-care, mobility, lifting and ambulation/stair navigation   []  (97112)Reviewed/Progressed HEP activities related to improving balance, coordination, kinesthetic sense, posture, motor skill, proprioception of core, proximal hip and LE for self care, mobility, lifting, and ambulation/stair navigation      Manual Treatments:  PROM / STM / Oscillations-Mobs:  G-I, II, III, IV (PA's, Inf., Post.)  [x]  (97140)  Provided manual therapy to mobilize LE, proximal hip and/or LS spine soft tissue/joints for the purpose of modulating pain, promoting relaxation,  increasing ROM, reducing/eliminating soft tissue swelling/inflammation/restriction, improving soft tissue extensibility and allowing for proper ROM for normal function with self care, mobility, lifting and ambulation.     Modalities:  CP 15 min    Charges:  Timed Code Treatment Minutes: 50   Total Treatment Minutes: 65     []  EVAL (LOW) 97161 (typically 20 minutes face-to-face)  []  EVAL (MOD) 20254 (typically 30 minutes face-to-face)  []  EVAL (HIGH) 97163 (typically 45 minutes face-to-face)  []  RE-EVAL     [x]  YH(06237) x  2   []  IONTO  []  NMR (97112) x      []  VASO  [x]  Manual (97140) x  1    []  Other:  []  TA x       []  Mech Traction (62831)  []  ES(attended) (51761)      []  ES (un) (60737):     GOALS:   Patient stated goal: To get back to work    Therapist goals for Patient:   Short Term Goals: To be achieved in: 2 weeks  1. Independent in HEP and progression per patient tolerance, in order to prevent re-injury.   2. Patient will have a decrease in pain to facilitate improvement in movement, function, and ADLs as indicated by Functional Deficits.    Long Term Goals: To be achieved in: 6 weeks  1. Disability index score of 20% or less for the LEFS to assist with reaching prior level of function.   2. Patient will demonstrate increased AROM to -5 to 125 to allow for proper joint functioning as indicated by patients Functional Deficits. progressing  3. Patient will demonstrate an increase in Strength to good proximal hip strength and control, 4+/5 in LE to allow for proper functional mobility as indicated by patients Functional Deficits. progressing  4. Patient will return to IND ambulation with good gait pattern. Progression, using quad cane for long distances, independent ambulation for short distances  5. Pt will be able to return to work. (patient specific functional  goal)   progress      Progression Towards Functional goals:  [x]  Patient is progressing as expected towards  functional goals listed.    []  Progression is slowed due to complexities listed.  []  Progression has been slowed due to co-morbidities.  []  Plan just implemented, too soon to assess goals progression  []  Other:     ASSESSMENT: Pt did well with all TE today, balance is improving    Treatment/Activity Tolerance:  [x]  Patient tolerated treatment well []  Patient limited by fatique  []  Patient limited by pain  []  Patient limited by other medical complications  []  Other:     Prognosis: [x]  Good []  Fair  []  Poor          Patient Requires Follow-up: [x]  Yes  []  No    PLAN: cont gait training and balance progression  [x]  Continue per plan of care []  Alter current plan (see comments)  []  Plan of care initiated []  Discharge     Electronically signed by: Marcelino Scot, PT,DPT [phone]

## 2016-12-06 NOTE — Other (Signed)
[facility] - [facility], [place]  [address]  [place], [place]  [address]  Phone: [phone]  Fax [phone]    Physical Therapy Daily Treatment Note  Date:  12/06/2016    Patient Name:  Molly [name]    DOB:  1961/07/08  MRN: [id number]  Restrictions/Precautions:  Post op TKR protocol  Medical/Treatment Diagnosis Information:  ?? 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  Insurance/Certification information:  PT Insurance Information: 09/13/16 MED MUT - $50/25 - $1000DED - $0CP - PT/OT MED NEC -90/10% - NO AUTH  Physician Information:  Referring Practitioner: Laurelyn Sickle MD  Plan of care signed (Y/N): N    Date of Patient follow up with Physician:     G-Code (if applicable):      Date G-Code Applied:  12/02/16       Progress Note:   Yes    No  Next due by: Visit #10       Latex Allergy:  NO      YES  Preferred Language for Healthcare:   English       other:    Visit # Insurance Allowable   11 BMN     Pain level: 0 /10     SUBJECTIVE: Patient states she feels like her balance is getting better.    OBJECTIVE:   Observation: Pt arrived without cane, ambulation with good mechanics   Test measurements: Knee flexion w/ heel slide    RESTRICTIONS/PRECAUTIONS: none    Exercises/Interventions:     Therapeutic Ex Sets/reps Notes        Seated HS stretch 3x30 sec HEP   Incline stretch 30" x 3         SL hip neutral forward/backward oscillations 1.5#     Heelslide 10" x 10 HEP   Slidelying ABD 1.5# 2 x 10 Tactile cues for hip positioning; added wt on 4/10   Bridge 5" x 20    Bike  5 min              Bridge on SB 25 x 5"    Squats w/ GVL 25x Improved form, good equal weight bearing   LBW GVL 3x 20 ft Inc wt 4/17             ATC See AT note Started 4/10   Orthovitals re-assessment 20 min    Manual Intervention     STM lat and medial joint  STM distal HS and prox calf  Prone quad stretch  PROM flex  8' 118 deg flex                            NMR re-education     SL balance LLE 5" x 10 New 4/17   FSU and LSU 20x ea New 4/17                                      Pt education was provided in post op restrictions, precautions, progression of rehab expectations and timeline.x6'    Therapeutic Exercise and NMR EXR   [phone]) Provided verbal/tactile cueing for activities related to strengthening, flexibility, endurance, ROM for improvements in LE, proximal hip, and core control with self care, mobility, lifting, ambulation.   [phone]) Provided  verbal/tactile cueing for activities related to improving balance, coordination, kinesthetic sense, posture, motor skill, proprioception  to assist with LE, proximal hip, and core control in self care, mobility, lifting, ambulation and eccentric single leg control.     NMR and Therapeutic Activities:     [phone] or [id number]) Provided verbal/tactile cueing for activities related to improving balance, coordination, kinesthetic sense, posture, motor skill, proprioception and motor activation to allow for proper function of core, proximal hip and LE with self care and ADLs   (19147) Gait Re-education- Provided training and instruction to the patient for proper LE, core and proximal hip recruitment and positioning and eccentric body weight control with ambulation re-education including up and down stairs     Home Exercise Program:     [phone]) Reviewed/Progressed HEP activities related to strengthening, flexibility, endurance, ROM of core, proximal hip and LE for functional self-care, mobility, lifting and ambulation/stair navigation    (97112)Reviewed/Progressed HEP activities related to improving balance, coordination, kinesthetic sense, posture, motor skill, proprioception of core, proximal hip and LE for self care, mobility, lifting, and ambulation/stair navigation      Manual Treatments:  PROM / STM / Oscillations-Mobs:  G-I, II, III, IV (PA's, Inf., Post.)   (97140)  Provided manual therapy to mobilize LE, proximal hip and/or LS spine soft tissue/joints for the purpose of modulating pain, promoting relaxation,  increasing ROM, reducing/eliminating soft tissue swelling/inflammation/restriction, improving soft tissue extensibility and allowing for proper ROM for normal function with self care, mobility, lifting and ambulation.     Modalities:  CP 15 min    Charges:  Timed Code Treatment Minutes: 50+17 w/ ATC   Total Treatment Minutes: 82 total      EVAL (LOW) 97161 (typically 20 minutes face-to-face)   EVAL (MOD) 21308 (typically 30 minutes face-to-face)   EVAL (HIGH) 97163 (typically 45 minutes face-to-face)   RE-EVAL      MV(78469) x  2    IONTO   NMR (97112) x  1    VASO   Manual (97140) x  1     Other:   TA x        Mech Traction (62952)   ES(attended) (84132)       ES (un) (44010):     GOALS:   Patient stated goal: To get back to work  ??  Therapist goals for Patient:   Short Term Goals: To be achieved in: 2 weeks  1. Independent in HEP and progression per patient tolerance, in order to prevent re-injury.   2. Patient will have a decrease in pain to facilitate improvement in movement, function, and ADLs as indicated by Functional Deficits.  ??  Long Term Goals: To be achieved in: 6 weeks  1. Disability index score of 20% or less for the LEFS to assist with reaching prior level of function.   2. Patient will demonstrate increased AROM to -5 to 125 to allow for proper joint functioning as indicated by patients Functional Deficits. progressing  3. Patient will demonstrate an increase in Strength to good proximal hip strength and control, 4+/5 in LE to allow for proper functional mobility as indicated by patients Functional Deficits. progressing  4. Patient will return to IND ambulation with good gait pattern. Progression, using quad cane for long distances, independent ambulation for short distances  5. Pt will be able to return to work. (patient  specific functional goal)   progress      Progression Towards Functional goals:   Patient is progressing as  expected towards functional goals listed.     Progression is slowed due to complexities listed.   Progression has been slowed due to co-morbidities.   Plan just implemented, too soon to assess goals progression   Other:     ASSESSMENT: Pt did well with all TE today, balance is improving    Treatment/Activity Tolerance:   Patient tolerated treatment well  Patient limited by fatique   Patient limited by pain   Patient limited by other medical complications   Other:     Prognosis:  Good  Fair   Poor          Patient Requires Follow-up:  Yes   No    PLAN: cont gait training and balance progression   Continue per plan of care  Alter current plan (see comments)   Plan of care initiated  Discharge     Electronically signed by: Marcelino Scot, PT,DPT [phone]

## 2016-12-09 ENCOUNTER — Inpatient Hospital Stay: Admit: 2016-12-09 | Primary: Gerontology

## 2016-12-09 NOTE — Other (Signed)
[facility] - [facility], [place]  [address]  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  Physician Information:    Laurelyn Sickle MD    Weeks Post-op  8 wks  12 wks 16 wks 20 wks   24 wks                            Activity Log                                                  DOS/DOI:                                                    Date: 11/29/16  12/02/16  12/06/16 12/09/16   ATC communication Needs limb confidence  HEP glut sqz w/ amb   Hips feel really worked out today   Scientist, research (medical) stretch       Soleus stretch       Hamstring stretch       ITB stretch       Hip Flexor stretch       Quad stretch       Adductor stretch              Weight Shifting sp Gait train - VC glut sqz                                fp                                 tp       Lateral walking (with/w/o TB)              Balance: PEP/Dyna board                      SLS  SL 5x10"  w/PT          Star excursion load/explode             Extremity reach UE/LE              Leg Press Bil. 80# 3x10 w/ add 80# 3x10  80# 3x10 90# 2x10                     Ecc. 60# 2x10 w/ add 70# 2x10  70# 2x10 70# 2x10  Inv.              Calf Press Bil. 60# 2x10 w/ add 80# 3x10  80# 3x10 90# 2x10                      Ecc.                           Inv.              MAH   Flex                  ABd 30# R/L 2x10 30# R/L 2x10  30# R/L 2x12 30# R/L 2x12              ADd                 TKE 45# 20x5" 60# 20x5"  60# 20x5" 60# 20x5"              Ext  60# R/L 2x10  60# R/L 2x12 60# R/L 2x12          Steps Up  6" FSU 15x   w/PT               Up and Over                  Down                  Lateral                  Rotation              Squats  mini  w/PT                    wall                    BOSU               Lunges:  Lunge to Balance                      Balance to Lunge                      Walking         OVL lateral band walks.half wall to leg press 3x  w/PT    Knee Extension Bilat.                                                  Ecc.                                  Inv.              Hamstring Curls Bilat.   30# 2x10 40# 2x10                              Ecc.                                  Inv.  Soleus Press Bilat.                              Ecc.                              Inv.                                     Ladders                2 feet in ea box-F/L 1 lap ea   Square                  Jump/Hop  Low                         Med.                         High                                                                     Modality CP 15' CP 15' CP 15' CP 15'   Initials                             DB SA  KRT JLW   Time spent with PT assistant   17'

## 2016-12-09 NOTE — Other (Signed)
[facility] - [facility] [place]  [address]  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  Insurance/Certification information:  PT Insurance Information: 09/13/16 MED MUT - $50/25 - $1000DED - $0CP - PT/OT MED NEC -90/10% - NO AUTH  Physician Information:  Referring Practitioner: Laurelyn Sickle MD  Plan of care signed (Y/N): N    Date of Patient follow up with Physician:     G-Code (if applicable):      Date G-Code Applied:  12/02/16       Progress Note:   Yes    No  Next due by: Visit #10       Latex Allergy:  NO      YES  Preferred Language for Healthcare:   English       other:    Visit # Insurance Allowable   12 BMN     Pain level: 0 /10     SUBJECTIVE: Patient states she feels like her balance is getting better.    OBJECTIVE:   Observation: Pt arrived without cane, ambulation with good mechanics   Test measurements: Knee flexion w/ heel slide    RESTRICTIONS/PRECAUTIONS: none    Exercises/Interventions:     Therapeutic Ex Sets/reps Notes   Piriformis stretch 30" X 3    Seated HS stretch 3x30 sec HEP   Incline stretch 30" x 3         SL hip neutral forward/backward oscillations 1.5#          Slidelying ABD 1.5# 2 x 10 Tactile cues for hip positioning; added wt on 4/10   Bridge 5" x 20    Bike  5 min    SL bridge 2 x 10    Bridge on SB w/ RLE lift 10x Unable to maintain hips up   Bridge on SB 20 x 5"    Squats w/ GVL 25x Improved form, good equal weight bearing   LBW GVL 3x 20 ft Inc wt 4/17             ATC See AT note Started 4/10        Manual Intervention     STM lat and medial joint  STM distal HS  and prox calf  Hip flexor stretch supine  PROM flex 8' 118 deg flex                            NMR re-education     SL balance LLE 10" x 10 New 4/17   FSU and LSU 20x ea New 4/17   Lateral heel taps 2 x 10    Glider lunges BUE support 15x challenging                            Pt education was provided in post op restrictions, precautions, progression of rehab expectations and timeline.x6'    Therapeutic Exercise and NMR EXR   [phone]) Provided verbal/tactile cueing for activities related to strengthening, flexibility, endurance, ROM  for improvements in LE, proximal hip, and core control with self care, mobility, lifting, ambulation.   [phone]) Provided verbal/tactile cueing for activities related to improving balance, coordination, kinesthetic sense, posture, motor skill, proprioception  to assist with LE, proximal hip, and core control in self care, mobility, lifting, ambulation and eccentric single leg control.     NMR and Therapeutic Activities:     [phone] or [phone]) Provided verbal/tactile cueing for activities related to improving balance, coordination, kinesthetic sense, posture, motor skill, proprioception and motor activation to allow for proper function of core, proximal hip and LE with self care and ADLs   (91478) Gait Re-education- Provided training and instruction to the patient for proper LE, core and proximal hip recruitment and positioning and eccentric body weight control with ambulation re-education including up and down stairs     Home Exercise Program:     [phone]) Reviewed/Progressed HEP activities related to strengthening, flexibility, endurance, ROM of core, proximal hip and LE for functional self-care, mobility, lifting and ambulation/stair navigation    (97112)Reviewed/Progressed HEP activities related to improving balance, coordination, kinesthetic sense, posture, motor skill, proprioception of core, proximal hip and LE for self care, mobility, lifting, and ambulation/stair  navigation      Manual Treatments:  PROM / STM / Oscillations-Mobs:  G-I, II, III, IV (PA's, Inf., Post.)   (97140) Provided manual therapy to mobilize LE, proximal hip and/or LS spine soft tissue/joints for the purpose of modulating pain, promoting relaxation,  increasing ROM, reducing/eliminating soft tissue swelling/inflammation/restriction, improving soft tissue extensibility and allowing for proper ROM for normal function with self care, mobility, lifting and ambulation.     Modalities:  CP 15 min    Charges:  Timed Code Treatment Minutes: 50   Total Treatment Minutes: 50       EVAL (LOW) 97161 (typically 20 minutes face-to-face)   EVAL (MOD) 13086 (typically 30 minutes face-to-face)   EVAL (HIGH) 97163 (typically 45 minutes face-to-face)   RE-EVAL      VH(84696) x  1    IONTO   NMR (97112) x  1    VASO   Manual (97140) x  1     Other:   TA x        Mech Traction (29528)   ES(attended) (41324)       ES (un) (40102):     GOALS:   Patient stated goal: To get back to work    Therapist goals for Patient:   Short Term Goals: To be achieved in: 2 weeks  1. Independent in HEP and progression per patient tolerance, in order to prevent re-injury.   2. Patient will have a decrease in pain to facilitate improvement in movement, function, and ADLs as indicated by Functional Deficits.    Long Term Goals: To be achieved in: 6 weeks  1. Disability index score of 20% or less for the LEFS to assist with reaching prior level of function.   2. Patient will demonstrate increased AROM to -5 to 125 to allow for proper joint functioning as indicated by patients Functional Deficits. progressing  3. Patient will demonstrate an increase in Strength to good proximal hip strength and control, 4+/5 in LE to allow for proper functional mobility as indicated by patients Functional Deficits. progressing  4. Patient will return to IND ambulation with good gait pattern. Progression, using quad cane for long  distances, independent ambulation for short distances  5. Pt will be able to return to work. (patient specific functional goal)  progress      Progression Towards Functional goals:   Patient is progressing as expected towards functional goals listed.     Progression is slowed due to complexities listed.   Progression has been slowed due to co-morbidities.   Plan just implemented, too soon to assess goals progression   Other:     ASSESSMENT: Pt tired today, required a significant amount of cueing with therex in order to have better muscular control and engagement of appropriate muscle groups.     Treatment/Activity Tolerance:   Patient tolerated treatment well  Patient limited by fatique   Patient limited by pain   Patient limited by other medical complications   Other:     Prognosis:  Good  Fair   Poor          Patient Requires Follow-up:  Yes   No    PLAN: additional standing strength/endurance   Continue per plan of care  Alter current plan (see comments)   Plan of care initiated  Discharge     Electronically signed by: Marcelino Scot, PT,DPT [phone]

## 2016-12-13 ENCOUNTER — Inpatient Hospital Stay: Admit: 2016-12-13 | Primary: Gerontology

## 2016-12-13 NOTE — Other (Signed)
[facility] - [facility] [place]  [address]  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 80# 3x10 w/ add 80# 3x10  80# 3x10  90# 2x10 90# 2x12                     Ecc. 60# 2x10 w/ add 70# 2x10  70# 2x10 70# 2x10 70# 2x15                     Inv.                Calf Press Bil. 60# 2x10 w/ add 80# 3x10  80# 3x10 90# 2x10 90# 2x12                       Ecc.                            Inv.                MAH   Flex                   ABd 30# R/L 2x10 30# R/L 2x10  30# R/L 2x12 30# R/L 2x12 30#  R/L 2x15              ADd                  TKE 45# 20x5" 60# 20x5"  60# 20x5" 60# 20x5" 60# 30x5"              Ext  60# R/L 2x10  60# R/L 2x12 60# R/L 2x12 60# R/L 2x15           Steps Up  6" FSU 15x  w/PT  w/PT              Up and Over                   Down                   Lateral                   Rotation                Squats  mini  w/PT                     wall                     [facility]                 Lunges:  Lunge to Balance                       Balance to Lunge                       Walking          OVL lateral band walks.half wall to leg press 3x  w/PT     Knee Extension Bilat.                                                   Ecc.  Inv.                Hamstring Curls Bilat.   30# 2x10 40# 2x10 40# 2x12                              Ecc.                                   Inv.                Soleus Press Bilat.                               Ecc.                               Inv.                             CC walkouts  Retro 7pl 2x10  Lat R/L 4pl 2x10           Ladders                2 feet in ea box-F/L 1 lap ea    Square                   Jump/Hop  Low                          Med.                          High                                                                        Modality CP 15' CP 15' CP 15' CP 15' CP 15'   Initials                             DB SA  KRT JLW KRT   Time spent with PT assistant   17'  0'

## 2016-12-13 NOTE — Other (Signed)
[facility] - [facility] [place]  [address]  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  Insurance/Certification information:  PT Insurance Information: 09/13/16 MED MUT - $50/25 - $1000DED - $0CP - PT/OT MED NEC -90/10% - NO AUTH  Physician Information:  Referring Practitioner: Laurelyn Sickle MD  Plan of care signed (Y/N): N    Date of Patient follow up with Physician:     G-Code (if applicable):      Date G-Code Applied:  12/02/16       Progress Note:   Yes    No  Next due by: Visit #10       Latex Allergy:  NO      YES  Preferred Language for Healthcare:   English       other:    Visit # Insurance Allowable   13 BMN     Pain level: 0 /10     SUBJECTIVE: Patient states she feels tired because she worked this weekend.     OBJECTIVE:   Observation: Pt arrived without cane, ambulation with good mechanics   Test measurements:     RESTRICTIONS/PRECAUTIONS: none    Exercises/Interventions:     Therapeutic Ex Sets/reps Notes   Piriformis stretch 30" X 3    Seated HS stretch 3x30 sec HEP   Incline stretch 30" x 3    SLR fkex 2#3 x 10 x 3" Cues for going slow and knee straight            Slidelying ABD 2# 2 x 10 Tactile cues for hip positioning; added wt on 4/10   Bridge w/ ADD 5" x 20         SL bridge 2 x 10         Bridge on SB 20 x 5"    Sit to stands  2 x 10    LBW GVL 3x 20 ft Inc wt 4/17             ATC See AT note Started 4/10        Manual Intervention     STM lat and medial joint  STM distal HS and prox calf  Hip flexor stretch supine  PROM flex 8' 118 deg flex                            NMR  re-education     SL balance LLE 10" x 10 New 4/17   FSU and LSU 20x ea New 4/17   Lateral heel taps 2 x 10                             Pt education was provided in post op restrictions, precautions, progression of rehab expectations and timeline.x6'    Therapeutic Exercise and NMR EXR   [phone]) Provided verbal/tactile cueing for activities related to strengthening, flexibility, endurance, ROM for improvements in LE, proximal hip, and core control with self care, mobility, lifting, ambulation.   (  [address]) Provided verbal/tactile cueing for activities related to improving balance, coordination, kinesthetic sense, posture, motor skill, proprioception  to assist with LE, proximal hip, and core control in self care, mobility, lifting, ambulation and eccentric single leg control.     NMR and Therapeutic Activities:     [phone] or [phone]) Provided verbal/tactile cueing for activities related to improving balance, coordination, kinesthetic sense, posture, motor skill, proprioception and motor activation to allow for proper function of core, proximal hip and LE with self care and ADLs   (91478) Gait Re-education- Provided training and instruction to the patient for proper LE, core and proximal hip recruitment and positioning and eccentric body weight control with ambulation re-education including up and down stairs     Home Exercise Program:     [phone]) Reviewed/Progressed HEP activities related to strengthening, flexibility, endurance, ROM of core, proximal hip and LE for functional self-care, mobility, lifting and ambulation/stair navigation    (97112)Reviewed/Progressed HEP activities related to improving balance, coordination, kinesthetic sense, posture, motor skill, proprioception of core, proximal hip and LE for self care, mobility, lifting, and ambulation/stair navigation      Manual Treatments:  PROM / STM / Oscillations-Mobs:  G-I, II, III, IV (PA's, Inf., Post.)   (97140) Provided manual therapy to  mobilize LE, proximal hip and/or LS spine soft tissue/joints for the purpose of modulating pain, promoting relaxation,  increasing ROM, reducing/eliminating soft tissue swelling/inflammation/restriction, improving soft tissue extensibility and allowing for proper ROM for normal function with self care, mobility, lifting and ambulation.     Modalities:  CP 15 min    Charges:  Timed Code Treatment Minutes: 50   Total Treatment Minutes: 50       EVAL (LOW) 97161 (typically 20 minutes face-to-face)   EVAL (MOD) 13086 (typically 30 minutes face-to-face)   EVAL (HIGH) 97163 (typically 45 minutes face-to-face)   RE-EVAL      VH(84696) x  2    IONTO   NMR (97112) x  1    VASO   Manual (97140) x  1     Other:   TA x        Mech Traction (29528)   ES(attended) (41324)       ES (un) (40102):     GOALS:   Patient stated goal: To get back to work    Therapist goals for Patient:   Short Term Goals: To be achieved in: 2 weeks  1. Independent in HEP and progression per patient tolerance, in order to prevent re-injury.   2. Patient will have a decrease in pain to facilitate improvement in movement, function, and ADLs as indicated by Functional Deficits.    Long Term Goals: To be achieved in: 6 weeks  1. Disability index score of 20% or less for the LEFS to assist with reaching prior level of function.   2. Patient will demonstrate increased AROM to -5 to 125 to allow for proper joint functioning as indicated by patients Functional Deficits. progressing  3. Patient will demonstrate an increase in Strength to good proximal hip strength and control, 4+/5 in LE to allow for proper functional mobility as indicated by patients Functional Deficits. progressing  4. Patient will return to IND ambulation with good gait pattern. Progression, using quad cane for long distances, independent ambulation for short distances  5. Pt will be able to return to work. (patient specific functional goal)    progress      Progression Towards Functional goals:   Patient is progressing as  expected towards functional goals listed.     Progression is slowed due to complexities listed.   Progression has been slowed due to co-morbidities.   Plan just implemented, too soon to assess goals progression   Other:     ASSESSMENT: Pt did well with TE, however, does require cueing for slow performance of therex.  Was fatigued after returning to work this weekend and we discussed slow return back into work in order to avoid excessive soreness and swelling.     Treatment/Activity Tolerance:   Patient tolerated treatment well  Patient limited by fatique   Patient limited by pain   Patient limited by other medical complications   Other:     Prognosis:  Good  Fair   Poor          Patient Requires Follow-up:  Yes   No    PLAN: cont endurance   Continue per plan of care  Alter current plan (see comments)   Plan of care initiated  Discharge     Electronically signed by: Marcelino Scot, PT,DPT [phone]

## 2016-12-16 ENCOUNTER — Inpatient Hospital Stay: Admit: 2016-12-16 | Primary: Gerontology

## 2016-12-16 NOTE — Other (Signed)
[facility] - [facility] [place]  [address]  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[employer]  Recommend standing glut strength for limb control   Bike         Elliptical         Treadmill         Airdyne                  Gastroc stretch         Soleus stretch         Hamstring stretch         ITB stretch         Hip Flexor stretch         Quad stretch         Adductor stretch                  Weight Shifting sp Gait train - VC glut sqz                                  fp                                   tp         Lateral walking (with/w/o TB)                  Balance: PEP/Dyna board                        SLS  SL 5x10"  w/PT            Star excursion load/explode  Extremity reach UE/LE                  Leg Press Bil. 80# 3x10 w/ add 80# 3x10  80# 3x10 90# 2x10 90# 2x12 100# 3x10                     Ecc. 60# 2x10 w/ add 70# 2x10  70# 2x10 70# 2x10 70# 2x15 80# 3x10                     Inv.                  Calf Press Bil. 60# 2x10 w/ add 80# 3x10  80# 3x10 90# 2x10 90# 2x12  1003 3x10                      Ecc.                              Inv.                  MAH   Flex                    ABd 30# R/L 2x10 30# R/L 2x10  30# R/L 2x12 30# R/L 2x12 30# R/L 2x15 45# R/L 2x10              ADd                   TKE 45# 20x5" 60# 20x5"  60# 20x5" 60# 20x5" 60# 30x5" 75# 30x5"              Ext  60# R/L 2x10  60# R/L 2x12 60# R/L 2x12 60# R/L 2x15 75# R/L 3x10            Steps Up  6" FSU 15x  w/PT  w/PT               Up and Over                    Down                    Lateral                    Rotation                  Squats  mini  w/PT                      wall                      [facility]                   Lunges:  Lunge to Balance                        Balance to Lunge                        Walking           OVL lateral band walks.half wall to leg press 3x  w/PT      Knee Extension Bilat.  Ecc.                                    Inv.                  Hamstring Curls Bilat.   30# 2x10 40# 2x10 40# 2x12 40# 3x10                              Ecc.                                    Inv.                  Soleus Press Bilat.                                Ecc.                                Inv.                              CC walkouts  Retro 7pl 2x10  Lat R/L 4pl 2x10 NV            Ladders                2 feet in ea box-F/L 1 lap ea     Square                    Jump/Hop  Low                           Med.                           High                                                                           Modality CP 15' CP 15' CP 15' CP 15' CP 15' CP 15'   Initials                             DB SA  KRT JLW KRT DB   Time spent with PT assistant   17'  0'

## 2016-12-16 NOTE — Other (Signed)
[name] - [facility] [place]  [address]  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  Insurance/Certification information:  PT Insurance Information: 09/13/16 MED MUT - $50/25 - $1000DED - $0CP - PT/OT MED NEC -90/10% - NO AUTH  Physician Information:  Referring Practitioner: Laurelyn Sickle MD  Plan of care signed (Y/N): N    Date of Patient follow up with Physician:     G-Code (if applicable):      Date G-Code Applied:  12/02/16       Progress Note:   Yes    No  Next due by: Visit #10       Latex Allergy:  NO      YES  Preferred Language for Healthcare:   English       other:    Visit # Insurance Allowable   14 BMN     Pain level: 0 /10     SUBJECTIVE: Patient reports her knee is improving. Not having much pain. Does get tired but has not been doing too many hours at work.     OBJECTIVE:   Observation: Decreased scar mobility and tightness distal quad  Test measurements: AROM flexion 120 degrees    RESTRICTIONS/PRECAUTIONS: none    Exercises/Interventions:     Therapeutic Ex Sets/reps Notes      Seated HS stretch 3x30 sec HEP   Incline stretch 30" x 3    SLR flex 1#3 x 10 x 3" Cues for going slow and knee straight            Slidelying ABD 1# 3 x 10 Tactile cues for hip positioning; added wt on 4/10   Bridge w/ ADD 5" x 20            LAQ 5" x 20 1#          LBW GVL 3x 20 ft Inc wt 4/17             ATC See AT note Started 4/10        Manual Intervention     Scar massage, STM distal quad and ITB, hip flexor stretch, prone quad stretch   15'                             NMR re-education     SL balance LLE  10" x 10 New 4/17   FSU and LSU 6" 15x ea New 4/17                               Pt education was provided in post op restrictions, precautions, progression of rehab expectations and timeline.x6'    Therapeutic Exercise and NMR EXR   [phone]) Provided verbal/tactile cueing for activities related to strengthening, flexibility, endurance, ROM for improvements in LE, proximal hip, and core control with self care, mobility, lifting, ambulation.   [phone]) Provided verbal/tactile cueing for activities related to improving balance, coordination, kinesthetic sense, posture, motor skill, proprioception  to assist with LE, proximal hip, and core control in self care, mobility, lifting, ambulation and eccentric single leg control.     NMR and Therapeutic Activities:     [phone] or [phone]) Provided verbal/tactile cueing for activities related to improving balance, coordination, kinesthetic sense, posture, motor skill, proprioception and motor activation to allow for proper function of core, proximal hip and LE with self care and ADLs   (09811) Gait Re-education- Provided training and instruction to the patient for proper LE, core and proximal hip recruitment and positioning and eccentric body weight control with ambulation re-education including up and down stairs     Home Exercise Program:     (97110) Reviewed/Progressed HEP activities related to strengthening, flexibility, endurance, ROM of core, proximal hip and LE for functional self-care, mobility, lifting and ambulation/stair navigation    (97112)Reviewed/Progressed HEP activities related to improving balance, coordination, kinesthetic sense, posture, motor skill, proprioception of core, proximal hip and LE for self care, mobility, lifting, and ambulation/stair navigation      Manual Treatments:  PROM / STM / Oscillations-Mobs:  G-I, II, III, IV (PA's, Inf., Post.)   (97140) Provided manual therapy to mobilize LE, proximal hip and/or LS spine soft  tissue/joints for the purpose of modulating pain, promoting relaxation,  increasing ROM, reducing/eliminating soft tissue swelling/inflammation/restriction, improving soft tissue extensibility and allowing for proper ROM for normal function with self care, mobility, lifting and ambulation.     Modalities:  CP 15 min    Charges:  Timed Code Treatment Minutes: 40   Total Treatment Minutes: 55      EVAL (LOW) 97161 (typically 20 minutes face-to-face)   EVAL (MOD) 91478 (typically 30 minutes face-to-face)   EVAL (HIGH) 97163 (typically 45 minutes face-to-face)   RE-EVAL      GN(56213) x  2    IONTO   NMR (97112) x       VASO   Manual (97140) x  1     Other:   TA x        Mech Traction (08657)   ES(attended) (84696)       ES (un) (29528):     GOALS:   Patient stated goal: To get back to work    Therapist goals for Patient:   Short Term Goals: To be achieved in: 2 weeks  1. Independent in HEP and progression per patient tolerance, in order to prevent re-injury.   2. Patient will have a decrease in pain to facilitate improvement in movement, function, and ADLs as indicated by Functional Deficits.    Long Term Goals: To be achieved in: 6 weeks  1. Disability index score of 20% or less for the LEFS to assist with reaching prior level of function.   2. Patient will demonstrate increased AROM to -5 to 125 to allow for proper joint functioning as indicated by patients Functional Deficits. progressing  3. Patient will demonstrate an increase in Strength to good proximal hip strength and control, 4+/5 in LE to allow for proper functional mobility as indicated by patients Functional Deficits. progressing  4. Patient will return to IND ambulation with good gait pattern. Progression, using quad cane for long distances, independent ambulation for short distances  5. Pt will be able to return to work. (patient specific functional goal)   progress      Progression Towards Functional goals:   Patient  is progressing as expected towards functional goals listed.     Progression is slowed due to complexities listed.    Progression has been slowed due to co-morbidities.   Plan just implemented, too soon to assess goals progression   Other:     ASSESSMENT: Patient continues to require cueing for controlled exercise. Able to achieve 120 degrees of flexion this date, which most restriction coming from distal quad and scar. Nearly full extension.     Treatment/Activity Tolerance:   Patient tolerated treatment well  Patient limited by fatique   Patient limited by pain   Patient limited by other medical complications   Other:     Prognosis:  Good  Fair   Poor          Patient Requires Follow-up:  Yes   No    PLAN: Work on extension and functional quad exercises   Continue per plan of care  Alter current plan (see comments)   Plan of care initiated  Discharge     Electronically signed by: Drucilla Chalet, DPT [phone]

## 2016-12-20 ENCOUNTER — Inpatient Hospital Stay: Admit: 2016-12-20 | Primary: Gerontology

## 2016-12-20 NOTE — Other (Signed)
[facility] - [facility] [place]  [address]  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[employer]  Recommend standing glut strength for limb control Dec to 1x/wk  Gym @ home  Needs proprio   Bike        Elliptical        Treadmill        Airdyne                Gastroc stretch        Soleus stretch        Hamstring stretch        ITB stretch        Hip Flexor stretch        Quad stretch        Adductor stretch                Weight Shifting sp                                  fp                                  tp        Lateral walking (with/w/o TB)                Balance: PEP/Dyna board                       SLS w/PT    w/ PT         Star excursion load/explode              Extremity reach UE/LE                Leg Press Bil. 80# 3x10 90# 2x10 90# 2x12  100# 3x10 100# 3x12                     Ecc. 70# 2x10 70# 2x10 70# 2x15 80# 3x10 80# 3x12                     Inv.                Calf Press Bil. 80# 3x10 90# 2x10 90# 2x12  1003 3x10 100# 3x12                      Ecc.                            Inv.                MAH   Flex                   ABd 30# R/L 2x12 30# R/L 2x12 30# R/L 2x15 45# R/L  2x10 60# R/L 3x10              ADd                  TKE 60# 20x5" 60# 20x5" 60# 30x5" 75# 30x5" 90# 30x5"              Ext 60# R/L 2x12 60# R/L 2x12 60# R/L 2x15 75# R/L 3x10 90# R/L 3x10           Steps Up w/PT  w/PT                Up and Over                   Down                   Lateral                   Rotation                Squats  mini     2HH 30x2" VC glute driver                 wall                     [facility]                Lunges:  Lunge to Balance                       Balance to Lunge                       Walking         w/PT       Knee Extension Bilat.                                                   Ecc.                                   Inv.  Hamstring Curls Bilat. 30# 2x10 40# 2x10 40# 2x12 40# 3x10 45# 3x10                              Ecc.                                   Inv.                Soleus Press Bilat.                               Ecc.                               Inv.                           CC walkouts  Retro 7pl 2x10  Lat R/L 4pl 2x10 NV            Ladders              2 feet in ea box-F/L 1 lap ea      Square                   Jump/Hop  Low                          Med.                          High                                                                        Modality CP 15' CP 15' CP 15' CP 15' cP 15'   Initials                             KRT JLW KRT DB DB   Time spent with PT assistant 17'  0'

## 2016-12-20 NOTE — Other (Signed)
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.562 s/p L TKR 01/28/36  Insurance/Certification information:  PT Insurance Information: 09/13/16 MED MUT - $50/25 - $1000DED - $0CP - PT/OT MED NEC -90/10% - NO AUTH  Physician Information:  Referring Practitioner: Brooke Pace MD  Plan of care signed (Y/N): YES    Date of Patient follow up with Physician:     G-Code (if applicable):      Date G-Code Applied:  12/02/16       Progress Note: []   Yes  [x]   No  Next due by: Visit #10       Latex Allergy:  [x] NO      [] YES  Preferred Language for Healthcare:   [x] English       [x] other:    Visit # Insurance Allowable   14 BMN     Pain level: 0 /10     SUBJECTIVE: Patient sates she was able to work with some help this past weekend without much issue.  Was able to walk half a mile outside yesterday, did have to take breaks but otherwise did OK.     OBJECTIVE:   Observation:   Test measurements: AROM flexion 120 degrees, full knee extension    RESTRICTIONS/PRECAUTIONS: none; needs orthovitals around 5/16    Exercises/Interventions:     Therapeutic Ex Sets/reps Notes      Seated HS stretch 3x30 sec HEP   Incline stretch 30" x 3    SLR flex 2#3 x 10 x 3" Cues for going slow and knee straight       Bike warmup 5 min    Slidelying ABD 2# 3 x 10 Tactile cues for hip positioning; added wt on 4/10   Bridge w/ ADD 5" x 20    Squats 20x                  LBW GVL 3x 20 ft Inc wt 4/17             ATC See AT note Started 4/10        Manual Intervention     Scar massage, STM distal quad and ITB, hip flexor stretch,   15'                              NMR re-education     SL balance LLE 10" x 10 New 4/17   FSU and LSU 6" 15x ea New 4/17                               Pt education was provided in post op restrictions, precautions, progression of rehab expectations and timeline.x6'    Therapeutic Exercise and NMR EXR  [x]  [phone]) Provided verbal/tactile cueing for activities related to strengthening, flexibility, endurance, ROM for improvements in LE, proximal hip, and core control with self care, mobility, lifting, ambulation.  []  [phone]) Provided verbal/tactile cueing for activities related to improving  balance, coordination, kinesthetic sense, posture, motor skill, proprioception  to assist with LE, proximal hip, and core control in self care, mobility, lifting, ambulation and eccentric single leg control.     NMR and Therapeutic Activities:    [x]  [phone] or [phone]) Provided verbal/tactile cueing for activities related to improving balance, coordination, kinesthetic sense, posture, motor skill, proprioception and motor activation to allow for proper function of core, proximal hip and LE with self care and ADLs  []  (40981) Gait Re-education- Provided training and instruction to the patient for proper LE, core and proximal hip recruitment and positioning and eccentric body weight control with ambulation re-education including up and down stairs     Home Exercise Program:    [x]  [phone]) Reviewed/Progressed HEP activities related to strengthening, flexibility, endurance, ROM of core, proximal hip and LE for functional self-care, mobility, lifting and ambulation/stair navigation   []  (97112)Reviewed/Progressed HEP activities related to improving balance, coordination, kinesthetic sense, posture, motor skill, proprioception of core, proximal hip and LE for self care, mobility, lifting, and ambulation/stair navigation      Manual Treatments:  PROM / STM / Oscillations-Mobs:  G-I, II, III, IV (PA's, Inf., Post.)  [x]  (97140) Provided manual therapy  to mobilize LE, proximal hip and/or LS spine soft tissue/joints for the purpose of modulating pain, promoting relaxation,  increasing ROM, reducing/eliminating soft tissue swelling/inflammation/restriction, improving soft tissue extensibility and allowing for proper ROM for normal function with self care, mobility, lifting and ambulation.     Modalities:  CP 15 min    Charges:  Timed Code Treatment Minutes: 40   Total Treatment Minutes: 55     []  EVAL (LOW) 97161 (typically 20 minutes face-to-face)  []  EVAL (MOD) 82956 (typically 30 minutes face-to-face)  []  EVAL (HIGH) 97163 (typically 45 minutes face-to-face)  []  RE-EVAL     [x]  OZ(30865) x  2   []  IONTO  []  NMR (78469) x      []  VASO  [x]  Manual (97140) x  1    []  Other:  []  TA x       []  Mech Traction (62952)  []  ES(attended) (84132)      []  ES (un) (44010):     GOALS:   Patient stated goal: To get back to work    Therapist goals for Patient:   Short Term Goals: To be achieved in: 2 weeks  1. Independent in HEP and progression per patient tolerance, in order to prevent re-injury.   2. Patient will have a decrease in pain to facilitate improvement in movement, function, and ADLs as indicated by Functional Deficits.    Long Term Goals: To be achieved in: 6 weeks  1. Disability index score of 20% or less for the LEFS to assist with reaching prior level of function.   2. Patient will demonstrate increased AROM to -5 to 125 to allow for proper joint functioning as indicated by patients Functional Deficits. progressing  3. Patient will demonstrate an increase in Strength to good proximal hip strength and control, 4+/5 in LE to allow for proper functional mobility as indicated by patients Functional Deficits. progressing  4. Patient will return to IND ambulation with good gait pattern. Progression, using quad cane for long distances, independent ambulation for short distances  5. Pt will be able to return to work. (patient specific functional goal)    progress      Progression Towards Functional goals:  [x]  Patient is progressing as expected towards functional goals listed.    []   Progression is slowed due to complexities listed.  []  Progression has been slowed due to co-morbidities.  []  Plan just implemented, too soon to assess goals progression  []  Other:     ASSESSMENT: Patient did well with all TE today, discussed decreasing to 1x/wk and getting patient a health plex pass once she is done with therapy to continue weight lifting.  She has met goals for knee motion and will reassess strength nv.    Treatment/Activity Tolerance:  [x]  Patient tolerated treatment well []  Patient limited by fatique  []  Patient limited by pain  []  Patient limited by other medical complications  []  Other:     Prognosis: [x]  Good []  Fair  []  Poor          Patient Requires Follow-up: [x]  Yes  []  No    PLAN: reassess; elliptical nv  [x]  Continue per plan of care []  Alter current plan (see comments)  []  Plan of care initiated []  Discharge     Electronically signed by: Jeral Pinch, [place]

## 2016-12-23 ENCOUNTER — Encounter: Primary: Gerontology

## 2016-12-26 ENCOUNTER — Inpatient Hospital Stay: Admit: 2016-12-26 | Attending: Rehabilitative and Restorative Service Providers" | Primary: Gerontology

## 2016-12-26 NOTE — Other (Signed)
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.562 s/p L TKR 12/23/96  Insurance/Certification information:  PT Insurance Information: 09/13/16 MED MUT - $50/25 - $1000DED - $0CP - PT/OT MED NEC -90/10% - NO AUTH  Physician Information:  Referring Practitioner: Brooke Pace MD  Plan of care signed (Y/N): YES    Date of Patient follow up with Physician:     G-Code (if applicable):      Date G-Code Applied:  12/02/16       Progress Note: []   Yes  [x]   No  Next due by: Visit #10       Latex Allergy:  [x] NO      [] YES  Preferred Language for Healthcare:   [x] English       [x] other:    Visit # Insurance Allowable   15 BMN     Pain level: 0 /10     SUBJECTIVE: Patient reports she feels like she is doing well. Biggest complaint is muscle tightness in her thigh after activity and in the morning. Also gets tired and fatigued with walking long distances.    OBJECTIVE:   Observation: palpable tightness and TP in quad and ITB  Test measurements: NT this date    RESTRICTIONS/PRECAUTIONS: none; needs orthovitals around 5/16    Exercises/Interventions:     Therapeutic Ex Sets/reps Notes      Seated HS stretch 3x30 sec HEP   Incline stretch 30" x 3    SLR VMO 1#2 x 10    Bike warmup 5 min    SB bridges 2 x 10 x 3"    Squats 20x                  LAQ 4# 2 x 10 x 5"    LBW GVL 3x 20 ft Inc wt 4/17             ATC See AT note Started 4/10        Manual Intervention     Scar massage, STM distal quad and ITB, massage stick to ITB in sidelying, hip flexor stretch,   15'                             NMR re-education     SL  balance LLE airex 10" x 10 New 4/17   New 4/17                               Pt education was provided in post op restrictions, precautions, progression of rehab expectations and timeline.x6'    Therapeutic Exercise and NMR EXR  [x]  [phone]) Provided verbal/tactile cueing for activities related to strengthening, flexibility, endurance, ROM for improvements in LE, proximal hip, and core control with self care, mobility, lifting, ambulation.  []  [phone]) Provided verbal/tactile cueing for activities related to improving balance, coordination, kinesthetic sense, posture, motor skill, proprioception  to assist with LE, proximal hip, and core control  in self care, mobility, lifting, ambulation and eccentric single leg control.     NMR and Therapeutic Activities:    [x]  [phone] or 97530) Provided verbal/tactile cueing for activities related to improving balance, coordination, kinesthetic sense, posture, motor skill, proprioception and motor activation to allow for proper function of core, proximal hip and LE with self care and ADLs  []  (60454) Gait Re-education- Provided training and instruction to the patient for proper LE, core and proximal hip recruitment and positioning and eccentric body weight control with ambulation re-education including up and down stairs     Home Exercise Program:    [x]  (97110) Reviewed/Progressed HEP activities related to strengthening, flexibility, endurance, ROM of core, proximal hip and LE for functional self-care, mobility, lifting and ambulation/stair navigation   []  (97112)Reviewed/Progressed HEP activities related to improving balance, coordination, kinesthetic sense, posture, motor skill, proprioception of core, proximal hip and LE for self care, mobility, lifting, and ambulation/stair navigation      Manual Treatments:  PROM / STM / Oscillations-Mobs:  G-I, II, III, IV (PA's, Inf., Post.)  [x]  (97140) Provided manual therapy to mobilize LE, proximal hip and/or LS spine soft tissue/joints  for the purpose of modulating pain, promoting relaxation,  increasing ROM, reducing/eliminating soft tissue swelling/inflammation/restriction, improving soft tissue extensibility and allowing for proper ROM for normal function with self care, mobility, lifting and ambulation.     Modalities:  CP 15 min    Charges:  Timed Code Treatment Minutes: 40   Total Treatment Minutes: 55     []  EVAL (LOW) 97161 (typically 20 minutes face-to-face)  []  EVAL (MOD) 09811 (typically 30 minutes face-to-face)  []  EVAL (HIGH) 97163 (typically 45 minutes face-to-face)  []  RE-EVAL     [x]  BJ(47829) x  2   []  IONTO  []  NMR (56213) x      []  VASO  [x]  Manual (97140) x  1    []  Other:  []  TA x       []  Mech T[employer] (08657)  []  ES(attended) (84696)      []  ES (un) (29528):     GOALS:   Patient stated goal: To get back to work    Therapist goals for Patient:   Short Term Goals: To be achieved in: 2 weeks  1. Independent in HEP and progression per patient tolerance, in order to prevent re-injury.   2. Patient will have a decrease in pain to facilitate improvement in movement, function, and ADLs as indicated by Functional Deficits.    Long Term Goals: To be achieved in: 6 weeks  1. Disability index score of 20% or less for the LEFS to assist with reaching prior level of function.   2. Patient will demonstrate increased AROM to -5 to 125 to allow for proper joint functioning as indicated by patients Functional Deficits. Met  3. Patient will demonstrate an increase in Strength to good proximal hip strength and control, 4+/5 in LE to allow for proper functional mobility as indicated by patients Functional Deficits. progressing  4. Patient will return to IND ambulation with good gait pattern. Met  5. Pt will be able to return to work. (patient specific functional goal)   Nearly met      Progression Towards Functional goals:  [x]  Patient is progressing as expected towards functional goals listed.    []  Progression is slowed due to complexities  listed.  []  Progression has been slowed due to co-morbidities.  []  Plan just implemented, too soon to assess goals progression  []   Other:     ASSESSMENT: Patient most limited by tightness in her quad and demonstrates decreased stability during SLB activities. Will benefit from 1-2 more visits to work on more dynamic activities and increase endurance.     Treatment/Activity Tolerance:  [x]  Patient tolerated treatment well []  Patient limited by fatique  []  Patient limited by pain  []  Patient limited by other medical complications  []  Other:     Prognosis: [x]  Good []  Fair  []  Poor          Patient Requires Follow-up: [x]  Yes  []  No    PLAN: ellpitical NV, add in some dynamic exercises (light ladders)  [x]  Continue per plan of care []  Alter current plan (see comments)  []  Plan of care initiated []  Discharge     Electronically signed by: Dominic Pea, DPT [phone]

## 2016-12-26 NOTE — Other (Signed)
[facility], [place]  [address], [address]  [place], [place]  [address]  Phone: [phone]  Fax [phone]      ATHLETIC TRAINING AREA ACTIVITY SHEET  Date:  12/26/2016    Patient Name:  Molly [name]    DOB:  04-06-1961  MRN: [id number]  Restrictions/Precautions:    Medical/Treatment Diagnosis Information:   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  Physician Information:    Laurelyn Sickle MD    Weeks Post-op  8 wks  12 wks 16 wks 20 wks   24 wks                            Activity Log                                                  DOS/DOI:                                                    Date: 12/06/16 12/09/16 12/13/16 12/16/16 12/20/16 12/26/16   ATC communication  Hips feel really worked out today Per PT- focus on endurance.  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[employer]  Recommend standing glut strength for limb control Dec to 1x/wk  Gym @ home  Needs proprio Gym HEP given; pt voiced good understanding.   Bike         Elliptical         Treadmill         Airdyne                  Gastroc stretch         Soleus stretch         Hamstring stretch         ITB stretch         Hip Flexor stretch         Quad stretch         Adductor stretch                  Weight Shifting sp                                   fp                                   tp         Lateral walking (with/w/o TB)                  Balance: PEP/Dyna board                        SLS w/PT    w/ PT          Star excursion load/explode  Extremity reach UE/LE                  Leg Press Bil. 80# 3x10 90# 2x10 90# 2x12 100# 3x10 100# 3x12 100# 3x14                     Ecc. 70# 2x10 70# 2x10 70# 2x15 80# 3x10 80# 3x12 80# 3x14                     Inv.                  Calf Press Bil. 80# 3x10 90# 2x10 90# 2x12  1003 3x10 100# 3x12 100# 3x14                      Ecc.                              Inv.                  MAH   Flex                    ABd 30# R/L 2x12 30# R/L 2x12 30# R/L 2x15 45# R/L 2x10 60# R/L 3x10 60# R/L 3x12              ADd                   TKE 60# 20x5" 60# 20x5" 60# 30x5" 75# 30x5" 90# 30x5" 90# 30x5"              Ext 60# R/L 2x12 60# R/L 2x12 60# R/L 2x15 75# R/L 3x10 90# R/L 3x10 90# R/L 3x12            Steps Up w/PT  w/PT                 Up and Over                    Down                    Lateral                    Rotation                  Squats  mini     2HH 30x2" VC glute driver w/ PT                 wall                      BOSU                  Lunges:  Lunge to Balance                        Balance to Lunge                        Walking          w/PT        Knee Extension Bilat.  Ecc.                                    Inv.                  Hamstring Curls Bilat. 30# 2x10 40# 2x10 40# 2x12 40# 3x10 45# 3x10 45# 3x12                              Ecc.                                    Inv.                  Soleus Press Bilat.                                Ecc.                                Inv.                            CC walkouts  Retro 7pl 2x10  Lat R/L 4pl 2x10 NV              Ladders              2 feet in ea box-F/L 1 lap ea       Square                    Jump/Hop  Low                           Med.                           High                                                                           Modality CP 15' CP 15' CP 15' CP 15' cP 15' CP 15'   Initials                             KRT JLW KRT DB DB DB   Time spent with PT assistant 17'  0'

## 2016-12-27 ENCOUNTER — Ambulatory Visit
Admit: 2016-12-27 | Discharge: 2016-12-27 | Payer: PRIVATE HEALTH INSURANCE | Attending: Rheumatology | Primary: Gerontology

## 2016-12-27 ENCOUNTER — Encounter: Primary: Gerontology

## 2016-12-27 DIAGNOSIS — M3219 Other organ or system involvement in systemic lupus erythematosus: Secondary | ICD-10-CM

## 2016-12-27 NOTE — Patient Instructions (Signed)
1.Continue with current medications as indicated      2. Check laboratory studies  Before next office visit to evaluate for any evidence of drug toxicity or side effects      3. Return visit for followup in 4 months or sooner if needed    Lab Results   Component Value Date    WBC 7.1 10/26/2016    HGB 11.6 (L) 10/26/2016    HCT 34.3 (L) 10/26/2016    MCV 94.3 10/26/2016    PLT 243 10/26/2016       Chemistry        Component Value Date/Time    NA 134 (L) 10/26/2016 0559    K 4.3 10/26/2016 0559    CL 96 (L) 10/26/2016 0559    CO2 30 10/26/2016 0559    BUN 9 10/26/2016 0559    CREATININE 0.6 10/26/2016 0559        Component Value Date/Time    CALCIUM 8.8 10/26/2016 0559    ALKPHOS 100 06/29/2016 1150    AST 20 06/29/2016 1150    ALT 17 06/29/2016 1150    BILITOT 0.4 06/29/2016 1150          Lab Results   Component Value Date    SEDRATE 16 09/29/2015     Lab Results   Component Value Date    CRP 7.0 (H) 06/29/2016

## 2016-12-27 NOTE — Progress Notes (Signed)
[facility]  [facility] [place]  Rheumatology Progress Note  Harley Hallmark, MD            [x]  [facility] Rheumatology         []   [facility] Rheumatology                                      [address]                  [address]  [address]                     [place], [place] [address]                [place], [place] [address]      [phone]                [phone]      Fax: [phone]                 Fax: [phone]           ________________________________________________________________________                       Patient Name:  Molly [name] is a 56 y.o. patient     Returns today for followup of systemic lupus and osteoarthritis. Since last seen, she Remains on Plaquenil 400 mg nightly, Relafen 500 mg twice a day and compound cream.  Has been doing well on these medications .  She also remains on Cymbalta  60 mg daily in the morning. She denies having any mouth sores or ulcers, or any  skin rashes. continues to work and has not missed any days of work secondary to her systemic lupus or osteoarthritis.. She underwent left total knee replacements in March and has recovered very well from that.  Tells me that she had no complications associated with the surgery.  Has completed physical therapy.  Currently other than her left knee she denies having any joint symptoms.  Her eye examination for evaluation of Plaquenil toxicity was done December 2017.  Has not been back to work secondary to her recent total knee replacement.    Laboratory studies that had been done in the past were reviewed.    l History:        Diagnosis Date   . Amenorrhea    . Cancer (HCC)    . History of non-Hodgkin's lymphoma 2006    treated with radiation and chemo   . Obesity    . Systemic lupus     see Dr Lehman Prom       Past Surgical History:        Procedure Laterality Date   . JOINT REPLACEMENT     . KNEE ARTHROPLASTY Left 10/25/2016    left total knee replacement using computer  navigation [facility])   . KNEE SURGERY Left     ACL and meniscus repair   . LEEP      in 1990's       Medications :    Prior to Admission medications    Medication Sig Start Date End Date Taking? Authorizing Provider   nabumetone (RELAFEN) 500 MG tablet Take 1 tablet by mouth 2 times daily 11/07/16  Yes Laurelyn Sickle, MD   magnesium citrate solution Take 296 mLs by  mouth once daily   Yes Historical Provider, MD   cyclobenzaprine (FLEXERIL) 5 MG tablet Take 1-2 tablets nightly as needed 10/11/16  Yes Harley HallmarkSoha Jefferey Lippmann, MD   DULoxetine (CYMBALTA) 60 MG extended release capsule Take 1 capsule by mouth daily  Patient taking differently: Take 60 mg by mouth nightly  10/11/16  Yes Harley HallmarkSoha Shyenne Maggard, MD   hydroxychloroquine (PLAQUENIL) 200 MG tablet Take 1 tablet by mouth 2 times daily  Patient taking differently: Take 400 mg by mouth nightly  10/11/16  Yes Harley HallmarkSoha Minnie Legros, MD   vitamin D (CHOLECALCIFEROL) 1000 UNIT TABS tablet Take 1,000 Units by mouth daily Twice a day   Yes Historical Provider, MD         Review of systems:       GENERAL:Denies having any fatigue or unintentional weight loss  HEENT:  Denies having any  Oral lesions, Or sores;lip dryness  LUNGS:Denies having had any new breathing difficulties  WG:NFAOZHGI:Denies having any GI upset from medications  MUSCULOSKELETAL:    Healing left knee symptoms  SKIN:skin lesions mainly involving  left upper extremity  LYMPHADENOPATHY: denies having had any recent infectious illness or lymphadenopathy      Physical exam:       Wt Readings from Last 3 Encounters:   12/27/16 293 lb (132.9 kg)   11/07/16 294 lb 15.6 oz (133.8 kg)   10/25/16 295 lb (133.8 kg)     BP Readings from Last 3 Encounters:   12/27/16 120/84   11/07/16 97/72   10/26/16 137/81     Pulse Readings from Last 3 Encounters:   12/27/16 80   11/07/16 100   10/26/16 84           GENERAL:Able to ambulate without any assistance.  HEENT: no evidence of any oral ulcers, lesions or sores  LUNGS:clear to auscultation bilaterally, good air  intake  CARDIOVASCULAR:regular rate  MUSCULOSKELETAL: Noted well-healed surgical scar over the left knee.  Mild swelling and resolving erythema involving the left knee.   Otherwise no evidence of synovitis swelling warmth or tenderness of patient's upper or lower peripheral joints.  Skin:Skin is warm and dry.  Well-healed surgical scar over the left knee.  Lymphadenopathy: no evidence of any palpable cervical or supraclavicular lymph nodes    Labs:       Lab Results   Component Value Date    WBC 7.1 10/26/2016    HGB 11.6 (L) 10/26/2016    HCT 34.3 (L) 10/26/2016    MCV 94.3 10/26/2016    PLT 243 10/26/2016       Chemistry        Component Value Date/Time    NA 134 (L) 10/26/2016 0559    K 4.3 10/26/2016 0559    CL 96 (L) 10/26/2016 0559    CO2 30 10/26/2016 0559    BUN 9 10/26/2016 0559    CREATININE 0.6 10/26/2016 0559        Component Value Date/Time    CALCIUM 8.8 10/26/2016 0559    ALKPHOS 100 06/29/2016 1150    AST 20 06/29/2016 1150    ALT 17 06/29/2016 1150    BILITOT 0.4 06/29/2016 1150          Lab Results   Component Value Date    SEDRATE 16 09/29/2015     Lab Results   Component Value Date    ANA POSITIVE 06/29/2016    C3 156.1 06/29/2016    C3 140.9 01/26/2016    C3 139.2 09/29/2015  C4 26.7 06/29/2016    C4 25.4 01/26/2016    C4 24.8 09/29/2015       Assessment/Plan:      Assessment/Plan:  Molly [name] was seen today for follow-up.    Diagnoses and all orders for this visit:    Other systemic lupus erythematosus with other organ involvement (HCC)- well and stable currently on Plaquenil 400 mg daily at bedtime and Relafen 500 mg daily-twice a day when necessary severe pain.  Continue with current management.  Good prognosis.  Doing well following the left total knee replacement.  Good prognosis.  -     CBC; Future  -     C-Reactive Protein; Future  -     Creatinine, Serum; Future  -     Hepatic Function Panel; Future  -     ANA; Future  -     Urinalysis  -     Anti-DNA Antibody, Double-Stranded; Future  -      C3 Complement; Future  -     C4 Complement; Future    High risk medication use - Will check labs to evaluate for any evidence of drug toxicity or side effects.    -     CBC; Future  -     C-Reactive Protein; Future  -     Creatinine, Serum; Future  -     Hepatic Function Panel; Future      Return in about 4 months (around 04/29/2017).      The risks and benefits of my recommendations, as well as other treatment options, benefits and side effects were discussed with the patient today. Questions were answered.    Letitia Libra  [facility]  [facility]  [address]  [place], [place] [address]  Phone:[phone]  Fax:[phone]    NOTE: This report is transcribed by using voice recognition software dragon. Every effort is made to ensure accuracy; however, inadvertent computerized transcription errors may be present.         Harley Hallmark, MD, 12/27/2016 11:33 AM

## 2016-12-30 ENCOUNTER — Encounter: Primary: Gerontology

## 2017-01-03 ENCOUNTER — Inpatient Hospital Stay: Admit: 2017-01-03 | Attending: Rehabilitative and Restorative Service Providers" | Primary: Gerontology

## 2017-01-03 NOTE — Other (Signed)
[facility] [place]  [address]  [place], [place]  [id number]  Phone: [phone]  Fax [phone]      ATHLETIC TRAINING AREA ACTIVITY SHEET  Date:  01/03/2017    Patient Name:  [name]    DOB:  February 27, 1961  MRN: [id number]  Restrictions/Precautions:    Medical/Treatment Diagnosis Information:   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  Physician Information:    Laurelyn SickleSuresh Nayak MD    Weeks Post-op  8 wks  12 wks 16 wks 20 wks   24 wks                            Activity Log                                                  DOS/DOI:                                                    Date: 12/13/16 12/16/16 12/20/16 12/26/16 01/03/17   ATC communication Per PT- focus on endurance.  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 Last Day!  Focus on function   Bike        Elliptical        Treadmill        Airdyne                Gastroc stretch        Soleus stretch        Hamstring stretch        ITB stretch        Hip Flexor stretch        Quad stretch        Adductor stretch                Weight Shifting sp                                  fp                                  tp        Lateral walking (with/w/o TB)                Balance: PEP/Dyna board                       SLS   w/ PT           Star excursion load/explode              Extremity reach UE/LE                Leg Press Bil.  90# 2x12 100# 3x10 100# 3x12 100# 3x14                      Ecc. 70# 2x15 80# 3x10 80# 3x12 80# 3x14                      Inv.                Calf Press Bil. 90# 2x12  1003 3x10 100# 3x12 100# 3x14                       Ecc.                            Inv.                MAH   Flex     OVL A-P R/L 3x10              ABd 30# R/L 2x15 45#  R/L 2x10 60# R/L 3x10 60# R/L 3x12 OVL R/L 3x10              ADd                  TKE 60# 30x5" 75# 30x5" 90# 30x5" 90# 30x5"               Ext 60# R/L 2x15 75# R/L 3x10 90# R/L 3x10 90# R/L 3x12            Steps Up w/PT                  Up and Over                   Down     4" post reach 2x10 R/L              Lateral                   Rotation                Squats  mini   2HH 30x2" VC glute driver w/ PT DD R60                 wall                     BOS[facility]                Lunges:  Lunge to Balance                       Balance to Lunge                       Walking                Knee Extension Bilat.                                                   Ecc.                                   Inv.  Hamstring Curls Bilat. 40# 2x12 40# 3x10 45# 3x10 45# 3x12 RDL 10# 2x10                              Ecc.                                   Inv.                Soleus Press Bilat.                               Ecc.                               Inv.                         CC walkouts  Retro 7pl 2x10  Lat R/L 4pl 2x10 NV              Ladders                    Square                   Jump/Hop  Low                          Med.                          High                                                                        Modality CP 15' CP 15' cP 15' CP 15' declined   Initials                             KRT DB DB DB DB/PN   Time spent with PT assistant 0'

## 2017-01-03 NOTE — Other (Signed)
[facility] [place]  [address] [place]  [address]  Phone: [phone]  Fax [phone]    Physical Therapy Daily Treatment Note  Date:  01/03/2017    Patient Name:  Molly [name]    DOB:  1960/12/02  MRN: [id number]  Restrictions/Precautions:  Post op TKR protocol  Medical/Treatment Diagnosis Information:   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.562 s/p L TKR 02/24/53  Insurance/Certification information:  PT Insurance Information: 09/13/16 MED MUT - $50/25 - $1000DED - $0CP - PT/OT MED NEC -90/10% - NO AUTH  Physician Information:  Referring Practitioner: Brooke Pace MD  Plan of care signed (Y/N): YES    Date of Patient follow up with Physician:     G-Code (if applicable):      Date G-Code Applied:  12/02/16       Progress Note: []   Yes  [x]   No  Next due by: Visit #10       Latex Allergy:  [x] NO      [] YES  Preferred Language for Healthcare:   [x] English       [x] other:    Visit # Insurance Allowable   16 BMN     Pain level: 0 /10     SUBJECTIVE: Patient reports she was very busy over the weekend and her knee did not bother her too much. Had to slow down a little and it felt tight but overall is very happy with her progress. Feels ready to discharge this date.     OBJECTIVE:   Observation: palpable tightness and TP in quad and ITB  Test measurements: NT this date    RESTRICTIONS/PRECAUTIONS: none; needs orthovitals around 5/16    Exercises/Interventions:     Therapeutic Ex Sets/reps Notes      Seated HS stretch 3x30 sec HEP   Incline stretch 30" x 3    SLR VMO 1#3 x 10    Bike warmup 5 min    SB bridges 3 x 10 x 3"                     LAQ 5# 2 x 10 x 5"    LBW GVL 4 x 20 ft Inc wt 4/17   Monster walks GVL 3 x 20'         ATC See AT note Started 4/10        Manual Intervention     Scar massage, STM distal quad and ITB, massage stick to ITB in sidelying, hip flexor stretch,   15'                              NMR re-education     SL balance LLE airex 10" x 10 New 4/17   New 4/17                               Pt education was provided in post op restrictions, precautions, progression of rehab expectations and timeline.x6'    Therapeutic Exercise and NMR EXR  [x]  [phone]) Provided verbal/tactile cueing for activities related to strengthening, flexibility, endurance, ROM for improvements in LE, proximal hip, and core control with self care, mobility, lifting, ambulation.  []  [phone]) Provided verbal/tactile cueing for activities related to improving balance, coordination, kinesthetic sense,  posture, motor skill, proprioception  to assist with LE, proximal hip, and core control in self care, mobility, lifting, ambulation and eccentric single leg control.     NMR and Therapeutic Activities:    [x]  [phone] or 95093) Provided verbal/tactile cueing for activities related to improving balance, coordination, kinesthetic sense, posture, motor skill, proprioception and motor activation to allow for proper function of core, proximal hip and LE with self care and ADLs  []  (26712) Gait Re-education- Provided training and instruction to the patient for proper LE, core and proximal hip recruitment and positioning and eccentric body weight control with ambulation re-education including up and down stairs     Home Exercise Program:    [x]  (97110) Reviewed/Progressed HEP activities related to strengthening, flexibility, endurance, ROM of core, proximal hip and LE for functional self-care, mobility, lifting and ambulation/stair navigation   []  (97112)Reviewed/Progressed HEP activities related to improving balance, coordination, kinesthetic sense, posture, motor skill, proprioception of core, proximal hip and LE for self care, mobility, lifting, and ambulation/stair navigation      Manual Treatments:  PROM / STM / Oscillations-Mobs:  G-I, II, III, IV (PA's, Inf., Post.)  [x]  (97140) Provided manual therapy to mobilize LE, proximal  hip and/or LS spine soft tissue/joints for the purpose of modulating pain, promoting relaxation,  increasing ROM, reducing/eliminating soft tissue swelling/inflammation/restriction, improving soft tissue extensibility and allowing for proper ROM for normal function with self care, mobility, lifting and ambulation.     Modalities:   declined; will ice at home  Charges:  Timed Code Treatment Minutes: 40   Total Treatment Minutes: 55     []  EVAL (LOW) 97161 (typically 20 minutes face-to-face)  []  EVAL (MOD) 45809 (typically 30 minutes face-to-face)  []  EVAL (HIGH) 97163 (typically 45 minutes face-to-face)  []  RE-EVAL     [x]  XI(33825) x  2   []  IONTO  []  NMR (05397) x      []  VASO  [x]  Manual (97140) x  1    []  Other:  []  TA x       []  Mech Traction (67341)  []  ES(attended) (93790)      []  ES (un) (24097):     GOALS:   Patient stated goal: To get back to work    Therapist goals for Patient:   Short Term Goals: To be achieved in: 2 weeks  1. Independent in HEP and progression per patient tolerance, in order to prevent re-injury. Met  2. Patient will have a decrease in pain to facilitate improvement in movement, function, and ADLs as indicated by Functional Deficits.Met    Long Term Goals: To be achieved in: 6 weeks  1. Disability index score of 20% or less for the LEFS to assist with reaching prior level of function. Progress  2. Patient will demonstrate increased AROM to -5 to 125 to allow for proper joint functioning as indicated by patients Functional Deficits. Met  3. Patient will demonstrate an increase in Strength to good proximal hip strength and control, 4+/5 in LE to allow for proper functional mobility as indicated by patients Functional Deficits. Met  4. Patient will return to IND ambulation with good gait pattern. Met  5. Pt will be able to return to work. (patient specific functional goal)   Met      Progression Towards Functional goals:  [x]  Patient is progressing as expected towards functional goals  listed.    []  Progression is slowed due to complexities listed.  []  Progression has been slowed due  to co-morbidities.  []  Plan just implemented, too soon to assess goals progression  []  Other:     ASSESSMENT: Patient is demonstrating improved strength and endurance. Ready to DC with HEP this date    Treatment/Activity Tolerance:  [x]  Patient tolerated treatment well []  Patient limited by fatique  []  Patient limited by pain  []  Patient limited by other medical complications  []  Other:     Prognosis: [x]  Good []  Fair  []  Poor          Patient Requires Follow-up: [x]  Yes  []  No    PLAN:   []  Continue per plan of care []  Alter current plan (see comments)  []  Plan of care initiated [x]  Discharge     Electronically signed by: Dominic Pea, DPT [phone]

## 2017-01-04 NOTE — Discharge Summary (Signed)
[facility] [place]  [address]  [place], [place]  [id number]  Phone: [phone]  Fax [phone]       Physical Therapy Discharge  Date: 01/09/2017        Patient Name:  Molly KetoBeth A Pagliuca    DOB:  1961/06/08  MRN: [id number]  Referring Physician:Nayak  Diagnosis:Left knee pain s/p L TKR                        ICD Code:M25.562  [x]  Surgical []  Conservative   Therapy Diagnosis/Practice Pattern:H      Number of Comorbidities:  [] 0     [x] 1-2    [] 3+  Total number of visits: 16   Reporting Period:   Beginning Date:11/03/16   End Date:01/03/17    OBJECTIVE  Test used Initial score Discharge Score   Pain Summary 0-10 6 0   Functional questionnaire LEFS 85% 31%   Functional Testing            ROM Left knee 10 - 60 0-120         Strength Left knee flexion DNT 4+/5    Left knee extension DNT 4+/5        Functional Limitation G-Code (if applicable):         PT G-Codes  Functional Assessment Tool Used: LEFS  Score: 31%  Functional Limitation: Mobility: Walking and moving around  Mobility: Walking and Moving Around Current Status ([phone](G8978): At least 20 percent but less than 40 percent impaired, limited or restricted  Mobility: Walking and Moving Around Goal Status [phone]([phone]): At least 20 percent but less than 40 percent impaired, limited or restricted  Mobility: Walking and Moving Around Discharge Status [phone]): At least 20 percent but less than 40 percent impaired, limited or restricted   Test/tests used to determine % limitation:  Actual Score used to drive % limitation:    Treatment to date:  [x]  Therapeutic Exercise    [x]  Modalities:  []  Therapeutic Activity             [] Ultrasound            [x] Electrical Stimulation  []  Gait Training     [] Cervical Traction    []  Lumbar Traction  []  Neuromuscular Re-education [x]  Cold/hotpack         [] Iontophoresis  [x]  Instruction in HEP      Other:  [x]  Manual Therapy                   []                        ?           []     Assessment:  []  All Goals were achieved.  [x]  The following goals were achieved (#'s):ST: 1,2; LT 2,3,4,5  [x]  The following goals were not achieved for the following reasons:/assessmen of improvement as it relates to each goal: LT 1; goal set too high    Plan of Care:  [x]  Discharge from Therapy Services due to:    Reason for Discharge:   [x]  All goals achieved except functional questionnaire - goal set too high   []  Patient having surgery  []  Physician discontinued therapy  []  Insurance/Financial Limitations []  Patient did not return for follow up visits []  Home program/1 visit only   []  No subjective or objective improvement []  Plateaued   []   Patient was unable to adhere to the plan of care established at evaluation. []  Referred back to physician for re-evaluation and did not return.     []  Other:?       Electronically signed by:  Drucilla Chalet, PT

## 2017-04-19 ENCOUNTER — Encounter

## 2017-04-19 LAB — URINALYSIS
Blood, Urine: NEGATIVE
Glucose, Ur: NEGATIVE mg/dL
Ketones, Urine: NEGATIVE mg/dL
Nitrite, Urine: NEGATIVE
Protein, UA: 30 mg/dL — AB
Specific Gravity, UA: 1.02 (ref 1.005–1.030)
Urobilinogen, Urine: 1 E.U./dL (ref <2.0)
pH, UA: 7 (ref 5.0–8.0)

## 2017-04-19 LAB — CBC
Hematocrit: 41.4 % (ref 36.0–48.0)
Hemoglobin: 13.7 g/dL (ref 12.0–16.0)
MCH: 31.6 pg (ref 26.0–34.0)
MCHC: 33.1 g/dL (ref 31.0–36.0)
MCV: 95.6 fL (ref 80.0–100.0)
MPV: 7.3 fL (ref 5.0–10.5)
Platelets: 328 10*3/uL (ref 135–450)
RBC: 4.33 M/uL (ref 4.00–5.20)
RDW: 13.6 % (ref 12.4–15.4)
WBC: 4.9 10*3/uL (ref 4.0–11.0)

## 2017-04-19 LAB — HEPATIC FUNCTION PANEL
ALT: 10 U/L (ref 10–40)
AST: 19 U/L (ref 15–37)
Albumin: 4.6 g/dL (ref 3.4–5.0)
Alkaline Phosphatase: 93 U/L (ref 40–129)
Bilirubin, Direct: <0.2 mg/dL (ref 0.0–0.3)
Total Bilirubin: 0.6 mg/dL (ref 0.0–1.0)
Total Protein: 7.7 g/dL (ref 6.4–8.2)

## 2017-04-19 LAB — C-REACTIVE PROTEIN: CRP: 4.1 mg/L (ref 0.0–5.1)

## 2017-04-19 LAB — CREATININE
Creatinine: 0.6 mg/dL (ref 0.6–1.1)
GFR African American: >60 (ref >60)
GFR Non-African American: >60 (ref >60)

## 2017-04-19 LAB — MICROSCOPIC URINALYSIS
Epithelial Cells, UA: 3 /HPF (ref 0–5)
Hyaline Casts, UA: 2 /LPF (ref 0–8)
RBC, UA: 6 /HPF — ABNORMAL HIGH (ref 0–4)
WBC, UA: 7 /HPF — ABNORMAL HIGH (ref 0–5)

## 2017-04-19 LAB — C4 COMPLEMENT: C4 Complement: 25.2 mg/dL (ref 10.0–40.0)

## 2017-04-19 LAB — C3 COMPLEMENT: C3 Complement: 155.7 mg/dL (ref 90.0–180.0)

## 2017-04-22 LAB — ANTI-DNA ANTIBODY, DOUBLE-STRANDED: Double Stranded Dna Ab, Igg: DETECTED — AB

## 2017-04-23 LAB — ANTI-DNA AB, DOUBLE-STRANDED IGG TITER: dsDNA Ab Titer: 1:40 {titer} — ABNORMAL HIGH

## 2017-05-02 ENCOUNTER — Encounter

## 2017-05-02 ENCOUNTER — Ambulatory Visit
Admit: 2017-05-02 | Discharge: 2017-05-02 | Payer: PRIVATE HEALTH INSURANCE | Attending: Rheumatology | Primary: Gerontology

## 2017-05-02 DIAGNOSIS — M3219 Other organ or system involvement in systemic lupus erythematosus: Secondary | ICD-10-CM

## 2017-05-02 NOTE — Progress Notes (Signed)
[facility]  [facility] [place]  [facility]heumatology Progress Note  Molly HallmarkSoha Caidynce Muzyka, MD            [x]  [facility]         []   [facility]                                      [address]                  [address]  [address]                     [place], [place] [id number]                [place], [place] [id number]      [phone]                [phone]      Fax: [phone]                 Fax: [phone]           ________________________________________________________________________                       Patient Name:  Molly [name] is a 56 y.o. patient     Returns today for followup of systemic lupus and osteoarthritis. Since last seen, she Remains on Plaquenil 400 mg nightly, Relafen 500 mg twice a day and compound cream.  Has been doing well on these medications .  She also remains on Cymbalta  60 mg daily in the morning. She denies having any mouth sores or ulcers, or any  skin rashes. continues to work and has not missed any days of work secondary to her systemic lupus or osteoarthritis.. She has done exceptionally well following her left total knee replacement.  She has started exercising and walking again.  Overall she's feeling and doing well.  Denies having any sicca symptoms, alopecia, mucous membrane ulcerations, photosensitivity, or any inflammatory arthritis symptoms.  Review of her labs indicated an abnormality in her urine analysis.  She denies having any symptoms of dysuria.      Laboratory studies that had been done in the past were reviewed.    l History:        Diagnosis Date   ??? Amenorrhea    ??? Cancer (HCC)    ??? History of non-Hodgkin's lymphoma 2006    treated with radiation and chemo   ??? Obesity    ??? Systemic lupus     see Molly [name]       Past Surgical History:        Procedure Laterality Date   ??? JOINT REPLACEMENT     ??? KNEE ARTHROPLASTY Left 10/25/2016    left total knee replacement using computer navigation [facility]  [employer])   ??? KNEE SURGERY Left     ACL and meniscus repair   ??? LEEP      in 1990's       Medications :    Prior to Admission medications    Medication Sig Start Date End Date Taking? Authorizing Provider   cyclobenzaprine (FLEXERIL) 5 MG tablet Take 1???2 tablets nightly as needed 10/11/16  Yes Molly HallmarkSoha Theron Cumbie, MD   DULoxetine (CYMBALTA) 60 MG extended release capsule Take 1 capsule by mouth daily  Patient taking differently:  Take 60 mg by mouth nightly  10/11/16  Yes Molly Hallmark, MD   hydroxychloroquine (PLAQUENIL) 200 MG tablet Take 1 tablet by mouth 2 times daily  Patient taking differently: Take 400 mg by mouth nightly  10/11/16  Yes Molly Hallmark, MD   vitamin D (CHOLECALCIFEROL) 1000 UNIT TABS tablet Take 1,000 Units by mouth daily Twice a day   Yes Historical Provider, MD   nabumetone (RELAFEN) 500 MG tablet Take 1 tablet by mouth 2 times daily 11/07/16   Molly Sickle, MD         Review of systems:       GENERAL:Denies having any fatigue or unintentional weight loss  HEENT:  Denies having any  Oral lesions, Or sores;lip dryness  LUNGS:Denies having had any new breathing difficulties  ZO:XWRUEA having any GI upset from medications  MUSCULOSKELETAL:      Denies having any joint symptoms  SKIN:skin lesions mainly involving  left upper extremity  LYMPHADENOPATHY: denies having had any recent infectious illness or lymphadenopathy      Physical exam:       Wt Readings from Last 3 Encounters:   05/02/17 294 lb (133.4 kg)   12/27/16 293 lb (132.9 kg)   11/07/16 294 lb 15.6 oz (133.8 kg)     BP Readings from Last 3 Encounters:   05/02/17 120/72   12/27/16 120/84   11/07/16 97/72     Pulse Readings from Last 3 Encounters:   05/02/17 74   12/27/16 80   11/07/16 100           GENERAL:Able to ambulate without any assistance.  HEENT: no evidence of any oral ulcers, lesions or sores  LUNGS:clear to auscultation bilaterally, good air intake  CARDIOVASCULAR:regular rate  MUSCULOSKELETAL: No evidence of synovitis swelling warmth or tenderness  of patient's upper or lower peripheral joints.Noted well-healed surgical scar over the left knee.   Skin:Skin is warm and dry.  Well-healed surgical scar over the left knee.  Lymphadenopathy: no evidence of any palpable cervical or supraclavicular lymph nodes    Labs:       Lab Results   Component Value Date    WBC 4.9 04/19/2017    HGB 13.7 04/19/2017    HCT 41.4 04/19/2017    MCV 95.6 04/19/2017    PLT 328 04/19/2017       Chemistry        Component Value Date/Time    NA 134 (L) 10/26/2016 0559    K 4.3 10/26/2016 0559    CL 96 (L) 10/26/2016 0559    CO2 30 10/26/2016 0559    BUN 9 10/26/2016 0559    CREATININE 0.6 04/19/2017 1101        Component Value Date/Time    CALCIUM 8.8 10/26/2016 0559    ALKPHOS 93 04/19/2017 1101    AST 19 04/19/2017 1101    ALT 10 04/19/2017 1101    BILITOT 0.6 04/19/2017 1101          Lab Results   Component Value Date    SEDRATE 16 09/29/2015     Lab Results   Component Value Date    ANA POSITIVE 06/29/2016    C3 155.7 04/19/2017    C3 156.1 06/29/2016    C3 140.9 01/26/2016    C4 25.2 04/19/2017    C4 26.7 06/29/2016    C4 25.4 01/26/2016       Assessment/Plan:      Assessment/Plan:  Molly [name] was seen today for follow-up.  Diagnoses and all orders for this visit:    Other systemic lupus erythematosus with other organ involvement (HCC)- well and stable currently on Plaquenil 400 mg daily at bedtime and Relafen 500 mg daily???twice a day when necessary severe pain.  Good prognosis.  No evidence of active disease.  Continue with current management.  -     CBC; Future  -     C-Reactive Protein; Future  -     Creatinine, Serum; Future  -     Hepatic Function Panel; Future  -     ANA; Future  -     Microalbumin / Creatinine Urine Ratio; Future  -     Anti-DNA Antibody, Double-Stranded; Future  -     C3 Complement; Future  -     C4 Complement; Future    High risk medication use - Will check labs to evaluate for any evidence of drug toxicity or side effects.    -     CBC; Future  -      C-Reactive Protein; Future  -     Creatinine, Serum; Future  -     Hepatic Function Panel; Future    Primary osteoarthritis of one knee, unspecified laterality- currently stable.  Continue with Cymbalta 60 mg nightly.  Continue Relafen    Dysuria  -     Urine Culture; Future  -     Urinalysis       Return in about 4 months (around 09/01/2017).      The risks and benefits of my recommendations, as well as other treatment options, benefits and side effects were discussed with the patient today. Questions were answered.    Letitia Libra  [facility]  [name] and Rheumatology  [address]  [place], [place] [address]  Phone:[phone]  Fax:[phone]    NOTE: This report is transcribed by using voice recognition software dragon. Every effort is made to ensure accuracy; however, inadvertent computerized transcription errors may be present.             Molly Hallmark, MD, 05/02/2017 11:00 AM

## 2017-05-02 NOTE — Patient Instructions (Signed)
1.Continue with current medications as indicated      2. Check laboratory studies today to evaluate for any evidence of drug toxicity or side effects      3. Return visit for followup in 4 months or sooner if needed    Lab Results   Component Value Date    WBC 4.9 04/19/2017    HGB 13.7 04/19/2017    HCT 41.4 04/19/2017    MCV 95.6 04/19/2017    PLT 328 04/19/2017       Chemistry        Component Value Date/Time    NA 134 (L) 10/26/2016 0559    K 4.3 10/26/2016 0559    CL 96 (L) 10/26/2016 0559    CO2 30 10/26/2016 0559    BUN 9 10/26/2016 0559    CREATININE 0.6 04/19/2017 1101        Component Value Date/Time    CALCIUM 8.8 10/26/2016 0559    ALKPHOS 93 04/19/2017 1101    AST 19 04/19/2017 1101    ALT 10 04/19/2017 1101    BILITOT 0.6 04/19/2017 1101          Lab Results   Component Value Date    SEDRATE 16 09/29/2015     Lab Results   Component Value Date    CRP 4.1 04/19/2017

## 2017-05-03 LAB — URINALYSIS
Bilirubin Urine: NEGATIVE
Blood, Urine: NEGATIVE
Glucose, Ur: NEGATIVE mg/dL
Ketones, Urine: NEGATIVE mg/dL
Nitrite, Urine: NEGATIVE
Protein, UA: NEGATIVE mg/dL
Specific Gravity, UA: 1.009 (ref 1.005–1.030)
Urobilinogen, Urine: 0.2 E.U./dL (ref <2.0)
pH, UA: 6.5 (ref 5.0–8.0)

## 2017-05-03 LAB — MICROSCOPIC URINALYSIS
Epithelial Cells, UA: 1 /HPF (ref 0–5)
Hyaline Casts, UA: 1 /LPF (ref 0–8)
RBC, UA: 2 /HPF (ref 0–4)
WBC, UA: 1 /HPF (ref 0–5)

## 2017-05-04 LAB — CULTURE, URINE: Urine Culture, Routine: NO GROWTH

## 2017-07-24 ENCOUNTER — Encounter

## 2017-07-24 NOTE — Telephone Encounter (Signed)
From: Lambert KetoBeth A Kennerly  Sent: 07/24/2017 3:46 PM EST  Subject: Medication Renewal Request    Rosaura Naaman PlummerA. Largent would like a refill of the following medications:     DULoxetine (CYMBALTA) 60 MG extended release capsule Harley Hallmark[Soha Mousa, MD]   [facility] Comment: [facility]      hydroxychloroquine (PLAQUENIL) 200 MG tablet Harley Hallmark[Soha Mousa, MD]   Patient Comment: [facility]     [facility] [phone] ID # [id number]

## 2017-07-25 MED ORDER — HYDROXYCHLOROQUINE SULFATE 200 MG PO TABS
200 MG | ORAL_TABLET | Freq: Two times a day (BID) | ORAL | 3 refills | Status: DC
Start: 2017-07-25 — End: 2018-04-18

## 2017-07-25 MED ORDER — DULOXETINE HCL 60 MG PO CPEP
60 MG | ORAL_CAPSULE | Freq: Every day | ORAL | 3 refills | Status: DC
Start: 2017-07-25 — End: 2018-04-18

## 2017-09-05 ENCOUNTER — Encounter: Attending: Rheumatology | Primary: Gerontology

## 2017-11-08 NOTE — Telephone Encounter (Signed)
LM TO CALL BACK REGARDING WOSM ONLINE REQUEST.

## 2017-12-11 ENCOUNTER — Ambulatory Visit: Admit: 2017-12-11 | Payer: PRIVATE HEALTH INSURANCE | Primary: Gerontology

## 2017-12-11 ENCOUNTER — Ambulatory Visit
Admit: 2017-12-11 | Discharge: 2017-12-11 | Payer: PRIVATE HEALTH INSURANCE | Attending: Orthopaedic Surgery | Primary: Gerontology

## 2017-12-11 DIAGNOSIS — Z96652 Presence of left artificial knee joint: Secondary | ICD-10-CM

## 2017-12-11 DIAGNOSIS — M25551 Pain in right hip: Secondary | ICD-10-CM

## 2017-12-11 NOTE — Patient Instructions (Signed)
Management discussed and patient voiced understanding.  They were instructed to follow up with their PCP regarding any abnormal vitals reading.

## 2017-12-11 NOTE — Progress Notes (Signed)
Chief Complaint    Follow-up (s/p 1 year LEFT TKR Mako 10/25/16; had fall about 2 weeks ago on anterior knee; now having superior knee pain with ambulation; full knee extension)      History of Present Illness:  Molly [name] is a 57 y.o. female.  This is a 1 year annual checkup on their left total knee replacement.  They're doing very well with little to no complaints.  He still reports some occasional stiffness and soreness from time to time but this is minimal.  They're preoperative pain is gone.  They deny any fevers, chills, constitutional symptoms.  She did recently fallen the anterior aspect of her knee about 2 weeks ago and said has some mild anterior patella pain but this is getting better.      Pain Assessment  Location of Pain: Knee  Location Modifiers: Left  Severity of Pain: 4(currently; at worst 6/10)  Quality of Pain: Aching  Duration of Pain: Persistent  Frequency of Pain: Constant  Aggravating Factors: Walking, Standing, Bending  Limiting Behavior: Some  Relieving Factors: Rest    Medical History:  Past Medical History:   Diagnosis Date   ??? Amenorrhea    ??? Cancer (HCC)    ??? History of non-Hodgkin's lymphoma 2006    treated with radiation and chemo   ??? Obesity    ??? Systemic lupus     followed by [name]     Patient Active Problem List    Diagnosis Date Noted   ??? Systemic lupus erythematosus (HCC) 09/22/2014     Priority: High   ??? 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 Primary osteoarthritis of one knee, unspecified laterality 10/25/2016   ??? Primary osteoarthritis of left knee 09/12/2016   ??? High risk medication use 01/25/2016   ??? S/P left ACL reconstruction 1991 by Dr. Henrene HawkingSiegel 02/07/2014   ??? Morbid obesity with BMI of 40.0-44.9, adult (HCC) 02/07/2014   ??? Vitamin D deficiency 09/19/2013   ??? Impaired glucose tolerance 02/06/2013   ??? HLD (hyperlipidemia) 02/06/2013   ??? H/O vitamin D deficiency 02/05/2013   ??? History of non-Hodgkin's lymphoma      Past Surgical History:   Procedure  Laterality Date   ??? JOINT REPLACEMENT     ??? KNEE ARTHROPLASTY Left 10/25/2016    left total knee replacement using computer navigation [facility])   ??? KNEE SURGERY Left     ACL and meniscus repair   ??? LEEP      in 1990's     Family History   Problem Relation Age of Onset   ??? Dementia Mother    ??? Thyroid Disease Mother    ??? Other Father         paraplegic after farming accident   ??? High Cholesterol Sister    ??? Cancer Sister [id number]        Breast CA   ??? Thyroid Disease Sister    ??? High Cholesterol Brother    ??? High Cholesterol Brother    ??? Rheum Arthritis Neg Hx    ??? Osteoarthritis Neg Hx    ??? Asthma Neg Hx    ??? Breast Cancer Neg Hx    ??? Diabetes Neg Hx    ??? Heart Failure Neg Hx    ??? Hypertension Neg Hx    ??? Migraines Neg Hx    ??? Ovarian Cancer Neg Hx    ??? Rashes/Skin Problems Neg Hx    ??? Seizures  Neg Hx    ??? Stroke Neg Hx      Social History     Socioeconomic History   ??? Marital status: Married     Spouse name: None   ??? Number of children: None   ??? Years of education: None   ??? Highest education level: None   Occupational History   ??? None   Social Needs   ??? Financial resource strain: None   ??? Food insecurity:     Worry: None     Inability: None   ??? Transportation needs:     Medical: None     Non-medical: None   Tobacco Use   ??? Smoking status: Former Smoker     Packs/day: 0.50     Years: 18.00     Pack years: 9.00     Types: Cigarettes   ??? Smokeless tobacco: Never Used   ??? Tobacco comment: quit 8 years ago   Substance and Sexual Activity   ??? Alcohol use: Yes     Alcohol/week: 0.6 oz     Types: 1 Shots of liquor per week     Comment: 2x/week   ??? Drug use: No   ??? Sexual activity: Not Currently   Lifestyle   ??? Physical activity:     Days per week: None     Minutes per session: None   ??? Stress: None   Relationships   ??? Social connections:     Talks on phone: None     Gets together: None     Attends religious service: None     Active member of club or organization: None     Attends meetings of clubs or organizations: None      Relationship status: None   ??? Intimate partner violence:     Fear of current or ex partner: None     Emotionally abused: None     Physically abused: None     Forced sexual activity: None   Other Topics Concern   ??? None   Social History Narrative   ??? None     Current Outpatient Medications   Medication Sig Dispense Refill   ??? DULoxetine (CYMBALTA) 60 MG extended release capsule Take 1 capsule by mouth daily 90 capsule 3   ??? hydroxychloroquine (PLAQUENIL) 200 MG tablet Take 1 tablet by mouth 2 times daily 180 tablet 3   ??? nabumetone (RELAFEN) 500 MG tablet Take 1 tablet by mouth 2 times daily 60 tablet 3   ??? vitamin D (CHOLECALCIFEROL) 1000 UNIT TABS tablet Take 1,000 Units by mouth daily Twice a day       No current facility-administered medications for this visit.      Current Outpatient Medications on File Prior to Visit   Medication Sig Dispense Refill   ??? DULoxetine (CYMBALTA) 60 MG extended release capsule Take 1 capsule by mouth daily 90 capsule 3   ??? hydroxychloroquine (PLAQUENIL) 200 MG tablet Take 1 tablet by mouth 2 times daily 180 tablet 3   ??? nabumetone (RELAFEN) 500 MG tablet Take 1 tablet by mouth 2 times daily 60 tablet 3   ??? vitamin D (CHOLECALCIFEROL) 1000 UNIT TABS tablet Take 1,000 Units by mouth daily Twice a day       No current facility-administered medications on file prior to visit.      Allergies   Allergen Reactions   ??? Sulfa Antibiotics Rash       Review of Systems:  Relevant review of systems reviewed  and available in the patient's chart    Vital Signs:  There were no vitals taken for this visit.    General Exam:   Constitutional: Patient is adequately groomed with no evidence of malnutrition  DTRs: Deep tendon reflexes are intact  Mental Status: The patient is oriented to time, place and person.  The patient's mood and affect are appropriate.  Lymphatic: The lymphatic examination bilaterally reveals all areas to be without enlargement or induration.  Vascular: Examination reveals no  swelling or calf tenderness.  Peripheral pulses are palpable and 2+.  Neurological: The patient has good coordination.  There is no weakness or sensory deficit.    Left  Knee Examination:    Inspection:  There is a well-healed surgical incision without any signs of infection.    Palpation:  Nontender to palpation    Range of Motion:  0-120?? of knee flexion    Strength:  5/5 quad and hamstring strength    Special Tests:  Stable to varus and valgus stress testing    Skin: There are no rashes, ulcerations or lesions.    Gait: Normal gait pattern    Reflex normal deep tendon reflexes    Additional Comments:       Additional Examinations:         Contralateral Exam: Examination of the right knee reveals intact skin. There is no focal tenderness. The patient demonstrates full painless range of motion with regard to flexion and extension. Strength is 5/5 throughout all planes. Ligamentous stability is grossly intact.    Radiology:     X-rays obtained and reviewed in office:  Views 3 views including AP, lateral, skyline  Location left knee  Impression there is excellent alignment of the prosthesis with no is a septic or aseptic loosening, polyethylene wear, or periprosthetic fractures.         Impression:  Encounter Diagnoses   Name Primary?   ??? History of total knee replacement, left Yes   ??? Arthralgia of hip, unspecified laterality    ??? Pain of right hip joint        Office Procedures:  Orders Placed This Encounter   Procedures   ??? XR KNEE LEFT (3 VIEWS)     Standing Status:   Future     Number of Occurrences:   1     Standing Expiration Date:   12/08/2018   ??? XR PELVIS (1-2 VIEWS)     Standing Status:   Future     Number of Occurrences:   1     Standing Expiration Date:   12/12/2018       Treatment Plan:  At this time the patient is doing very well at 1 year postop.  Would recommend a continued maintenance program for strengthening and endurance.  She continually work at maintaining healthy bodyweight.  We'll plan on  seeing them back every 4-5 years for routine checkup or sooner if there is any problems.

## 2017-12-30 ENCOUNTER — Inpatient Hospital Stay
Admit: 2017-12-30 | Discharge: 2017-12-30 | Disposition: A | Discharge status: Home / Self Care | Payer: PRIVATE HEALTH INSURANCE | Attending: Emergency Medicine

## 2017-12-30 DIAGNOSIS — I8001 Phlebitis and thrombophlebitis of superficial vessels of right lower extremity: Secondary | ICD-10-CM

## 2017-12-30 MED ORDER — CEPHALEXIN 500 MG PO CAPS
500 MG | ORAL_CAPSULE | Freq: Four times a day (QID) | ORAL | 0 refills | Status: AC
Start: 2017-12-30 — End: 2018-01-09

## 2017-12-30 NOTE — ED Provider Notes (Signed)
ED Attending Attestation Note     Date of evaluation: 12/30/2017    This patient was seen by the advance practice provider.  I have seen and examined the patient, agree with the workup, evaluation, management and diagnosis. The care plan has been discussed.  My assessment reveals well appearing female with right leg swelling.  She has very localized erythema/warmth/swelling over an area of varicosities on the medial right leg.  Palpable tender cord felt.  No other signs/sx of DVT- more of an SVT/thrombophelbitis picture.  Full knee ROM without concern for septic/gouty joint.  Instructed to do warm compresses, wrapped in an ACE myself, and rescue abx if streaking erythema/fever, etc.  Also return precautions for increasing pain, redness, or leg swelling.     Gust Rung III, MD  12/30/17 1136

## 2017-12-30 NOTE — Discharge Instructions (Signed)
Ace wrap for comfort.    Warm compresses and elevation.    If redness becomes worse, starts extending up the leg start Keflex and return to the emergency room.    Tylenol or Motrin for pain.    Return here if worse in any other way.

## 2017-12-30 NOTE — ED Provider Notes (Signed)
[facility]  EMERGENCY DEPARTMENT ENCOUNTER          NURSE PRACTITIONER NOTE       Date of evaluation: 12/30/2017    Chief Complaint     Knee Pain (RIGHT KNEE PAIN + SWELLING; VARICOSE VEINS NOTED)      History of Present Illness     Molly [name] is a 57 y.o. female who presents to emergency room with complaints of pain and swelling to the medial aspect of the right knee.  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  She had noticed a swelling yesterday is painful.  Denies chest pain or shortness breath.  Denies nausea, vomiting, fever.  Patient has taken Tylenol for her symptoms.  She denies calf tenderness.  Patient was concerned she was diagnosed with non-Hodgkin's lymphoma about 13 years ago.    Review of Systems     As noted above, otherwise negative.    Past Medical, Surgical, Family, and Social History     She has a past medical history of Amenorrhea, Cancer (HCC), History of non-Hodgkin's lymphoma, Obesity, and Systemic lupus.  She has a past surgical history that includes LEEP; knee surgery (Left); Knee Arthroplasty (Left, 10/25/2016); and joint replacement.  Her family history includes Cancer (age of onset: 73) in her sister; Dementia in her mother; High Cholesterol in her brother, brother, and sister; Other in her father; Thyroid Disease in her mother and sister.  She reports that she has quit smoking. Her smoking use included cigarettes. She has a 9.00 pack-year smoking history. She has never used smokeless tobacco. She reports that she drinks about 0.6 oz of alcohol per week. She reports that she does not use drugs.    Medications     Previous Medications    DULOXETINE (CYMBALTA) 60 MG EXTENDED RELEASE CAPSULE    Take 1 capsule by mouth daily    HYDROXYCHLOROQUINE (PLAQUENIL) 200 MG TABLET    Take 1 tablet by mouth 2 times daily    NABUMETONE (RELAFEN) 500 MG TABLET    Take 1 tablet by mouth 2 times daily    VITAMIN D (CHOLECALCIFEROL) 1000  UNIT TABS TABLET    Take 1,000 Units by mouth daily Twice a day       Allergies     She is allergic to sulfa antibiotics.    Physical Exam     INITIAL VITALS: BP: (!) 155/106, Temp: 97.9 ??F (36.6 ??C),Pulse: 94, Resp: 16, SpO2: 98 %   Physical Exam    Constitutional: Welldeveloped, Well nourished, No acute distress, Non-toxic appearance.   Cardiovascular: Normal heart rate, Normal rhythm, No murmurs, No rubs, No gallops.   Thorax & Lungs: No respiratory distress, Normal breath sounds, No wheezing, No chest tenderness.   Skin: Warm, Dry, No erythema, No rash.   Extremities: Intact distal pulses.   Musculoskeletal: Right lower extremity exam: Patient does have mild swelling noted to the medial aspect of the right knee area she does have a large varicose veins noted along the medial knee and proximal medial calf. There is some erythema and warmth noted.  There is no calf tenderness.  Negative Homans sign.  She has good range of motion to her knee.  The knee is stable.  Peripheral pulses intact.   Neurologic: Alert & oriented x 3, Normal motor function, Normal sensory function, No focal deficits noted.     Diagnostic Results     EKG  Negative    RADIOLOGY:  No orders to display       LABS:   No results found for this visit on 12/30/17.    ED BEDSIDE ULTRASOUND:  None    RECENT VITALS:  BP: (!) 155/106, Temp: 97.9 ??F (36.6 ??C), Pulse: 94, Resp: 16, SpO2: 98 %     Procedures     None    ED Course     Nursing Notes, Past Medical Hx, Past Surgical Hx, Social Hx, Allergies, and Family Hx were reviewed.    The patient was given the following medications:  Orders Placed This Encounter   Medications   ??? cephALEXin (KEFLEX) 500 MG capsule     Sig: Take 1 capsule by mouth 4 times daily for 10 days     Dispense:  40 capsule     Refill:  0            CONSULTS:  None    MEDICAL DECISION MAKING / ASSESSMENT / PLAN     Molly [name] is a 57 y.o. female presents emergency room complaints of right knee pain since yesterday.  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  Denies calf tenderness.  Denies chest pain or shortness of breath.  On exam patient has evidence of varicose veins on the medial right knee and proximal medial calf. There is some erythema and warmth noted. Her knee is stable.  Patient has negative Homans sign.  Peripheral pulses intact.    Patient was placed in an Ace wrap.  She was written a prescription for Keflex.  Instructed to start in the next 2-3 days if the erythema starts extending.  Compresses the area with elevation.  Call primary doctor on Monday for follow-up.  Return here for worsening symptoms or other concerns.  Symptoms appear consistent with superficial thrombophlebitis.  At this time, no further imaging or diagnostic studies indicated.    This patient was also evaluated by the attending physician. Complete history and physical was performed by me and Gust Rung III, MD  .Nursing notes, past medical history, surgical history, family history and social history were reviewed and addressed in the HPI. All care plans were discussedand agreed upon.     Clinical Impression     1. Thrombophlebitis of superficial veins of right lower extremity        Disposition     PATIENT REFERRED TO:  Merla Riches, MD  [address]  [address]  [place] [address]  [phone]    Call in 2 days        DISCHARGE MEDICATIONS:  New Prescriptions    CEPHALEXIN (KEFLEX) 500 MG CAPSULE    Take 1 capsule by mouth 4 times daily for 10 days       DISPOSITION Decision To Discharge       Porfirio Mylar, APRN - CNP  12/30/17 1143

## 2018-02-19 ENCOUNTER — Encounter

## 2018-02-19 MED ORDER — NABUMETONE 500 MG PO TABS
500 MG | ORAL_TABLET | Freq: Two times a day (BID) | ORAL | 5 refills | Status: DC
Start: 2018-02-19 — End: 2021-02-24

## 2018-04-18 ENCOUNTER — Encounter

## 2018-04-18 MED ORDER — DULOXETINE HCL 60 MG PO CPEP
60 MG | ORAL_CAPSULE | Freq: Every day | ORAL | 5 refills | Status: AC
Start: 2018-04-18 — End: 2021-08-10

## 2018-04-18 MED ORDER — HYDROXYCHLOROQUINE SULFATE 200 MG PO TABS
200 MG | ORAL_TABLET | Freq: Two times a day (BID) | ORAL | 2 refills | Status: AC
Start: 2018-04-18 — End: 2018-07-17

## 2018-04-18 NOTE — Telephone Encounter (Signed)
Pt called the new office asking to get refills on:  Plaquenil   Cymbalta  Using new pharmacy. Placed in epic and pended refills.

## 2019-05-08 ENCOUNTER — Telehealth
Admit: 2019-05-08 | Discharge: 2019-05-08 | Payer: PRIVATE HEALTH INSURANCE | Attending: Gerontology | Primary: Gerontology

## 2019-05-08 DIAGNOSIS — Z7689 Persons encountering health services in other specified circumstances: Secondary | ICD-10-CM

## 2019-05-08 NOTE — Progress Notes (Signed)
05/08/2019    TELEHEALTH EVALUATION -- Audio/Visual (During COVID-19 public health emergency)    HPI:    Molly [name] (DOB:  [birth year]) has requested an audio/video evaluation for the following concern(s):    Chief Complaint   Patient presents with   ??? Establish Care   ??? Cough     She is a patient new to practice  She sees Dr. Sullivan Lone who advised her getting a general practitioner.   She has a cough that has been goin gon since about February.   She had a covid test that was negative  She states that it is worse at night  She takes benadryl at night.  She is not wheezing.   She is having sinus headaches that is improved with coffee  She is not having fevers.  She does not take anything for the cough during the day, it is intermittently productive.  Review of Systems   Constitutional: Negative for chills, diaphoresis and fever.   HENT: Negative for congestion, ear discharge, ear pain, hearing loss, nosebleeds, sinus pain, sore throat and tinnitus.    Eyes: Negative for photophobia, pain, discharge and redness.   Respiratory: Negative for cough, shortness of breath, wheezing and stridor.    Cardiovascular: Negative for chest pain, palpitations and leg swelling.   Gastrointestinal: Negative for abdominal pain, blood in stool, constipation, diarrhea, nausea and vomiting.   Endocrine: Negative for polydipsia.   Genitourinary: Negative for dysuria, flank pain, frequency, hematuria and urgency.   Musculoskeletal: Negative for back pain, myalgias and neck pain.   Skin: Negative for rash.   Allergic/Immunologic: Negative for environmental allergies.   Neurological: Negative for dizziness, tremors, seizures, weakness and headaches.   Hematological: Does not bruise/bleed easily.   Psychiatric/Behavioral: Negative for hallucinations and suicidal ideas. The patient is not nervous/anxious.        Prior to Visit Medications    Medication Sig Taking? Authorizing Provider   hydroxychloroquine (PLAQUENIL) 200 MG tablet Take 400 mg by  mouth daily Yes Historical Provider, MD   DULoxetine (CYMBALTA) 60 MG extended release capsule Take 1 capsule by mouth daily Yes Harley Hallmark, MD   nabumetone (RELAFEN) 500 MG tablet Take 1 tablet by mouth 2 times daily Yes Harley Hallmark, MD   vitamin D (CHOLECALCIFEROL) 1000 UNIT TABS tablet Take 1,000 Units by mouth daily Twice a day Yes Historical Provider, MD       Past Medical History:   Diagnosis Date   ??? Amenorrhea    ??? Cancer (HCC)    ??? History of non-Hodgkin's lymphoma 2006    treated with radiation and chemo   ??? Obesity    ??? Systemic lupus     followed by Dr. Harley Hallmark      Past Surgical History:   Procedure Laterality Date   ??? JOINT REPLACEMENT     ??? KNEE ARTHROPLASTY Left 10/25/2016    left total knee replacement using computer navigation [facility])   ??? KNEE SURGERY Left     ACL and meniscus repair   ??? LEEP      in 1990's      Relationships   Social connections   ??? Talks on phone: Not on file   ??? Gets together: Not on file   ??? Attends religious service: Not on file   ??? Active member of club or organization: Not on file   ??? Attends meetings of clubs or organizations: Not on file   ??? Relationship status: Not on file  Family History   Problem Relation Age of Onset   ??? Dementia Mother    ??? Thyroid Disease Mother    ??? Other Father         paraplegic after farming accident   ??? High Cholesterol Sister    ??? Cancer Sister 53        Breast CA   ??? Thyroid Disease Sister    ??? High Cholesterol Brother    ??? High Cholesterol Brother    ??? Rheum Arthritis Neg Hx    ??? Osteoarthritis Neg Hx    ??? Asthma Neg Hx    ??? Breast Cancer Neg Hx    ??? Diabetes Neg Hx    ??? Heart Failure Neg Hx    ??? Hypertension Neg Hx    ??? Migraines Neg Hx    ??? Ovarian Cancer Neg Hx    ??? Rashes/Skin Problems Neg Hx    ??? Seizures Neg Hx    ??? Stroke Neg Hx          Patient-Reported Vitals 05/08/2019   Patient-Reported Weight 320 lbs   Patient-Reported Height 6'   Patient-Reported Systolic 269   Patient-Reported Diastolic 92        PHYSICAL  EXAMINATION:  Physical Exam  Constitutional:       Appearance: Normal appearance.   HENT:      Head: Normocephalic and atraumatic.      Nose: Nose normal.   Eyes:      Extraocular Movements: Extraocular movements intact.      Conjunctiva/sclera: Conjunctivae normal.      Pupils: Pupils are equal, round, and reactive to light.   Neck:      Musculoskeletal: Normal range of motion.   Pulmonary:      Effort: Pulmonary effort is normal.   Musculoskeletal: Normal range of motion.   Skin:     Coloration: Skin is not jaundiced or pale.      Findings: No bruising, erythema, lesion or rash.   Neurological:      Mental Status: She is alert and oriented to person, place, and time. Mental status is at baseline.   Psychiatric:         Mood and Affect: Mood normal.         Behavior: Behavior normal.         Thought Content: Thought content normal.         Judgment: Judgment normal.         ASSESSMENT/PLAN:  Problem List     Systemic lupus erythematosus (HCC) (Chronic)     Following with Dr. Darl Pikes         Establishing care with new doctor, encounter for     Patient new to the practice  Previous pcp has moved  She sees dr. Oneal Deputy for her lupus who orders routine labs, but unsure what all is drawn- these have been requested  Reviewed HM         Seasonal allergies     Discussed differences between allergies, virus, and bacterial infections  Symptoms consistent with allergies  Start flonase daily   Call if no better  otc cough syrup prn                    No follow-ups on file.    Molly [name] is a 58 y.o. female being evaluated by a Virtual Visit (video visit) encounter to address concerns as mentioned above.  A caregiver was present when appropriate. Due to  this being a Scientist, research (medical)TeleHealth encounter (During COVID-19 public health emergency), evaluation of the following organ systems was limited: Vitals/Constitutional/EENT/Resp/CV/GI/GU/MS/Neuro/Skin/Heme-Lymph-Imm.  Pursuant to the emergency declaration under the [facility] Act and the  [employer] Emergencies Act, 1135 waiver authority and the [employer]onavirus Preparedness and [employer] Supplemental Appropriations Act, this Virtual Visit was conducted with patient's (and/or legal guardian's) consent, to reduce the patient's risk of exposure to COVID-19 and provide necessary medical care.  The patient (and/or legal guardian) has also been advised to contact this office for worsening conditions or problems, and seek emergency medical treatment and/or call 911 if deemed necessary.     Patient identification was verified at the start of the visit: Yes    Total time spent on this encounter: Not billed by time    Services were provided through a video synchronous discussion virtually to substitute for in-person clinic visit. Patient and provider were located at their individual homes.    --Lindell SparLauren N Cammy Sanjurjo, APRN - CNP on 05/08/2019 at 12:03 PM    An electronic signature was used to authenticate this note.

## 2019-05-08 NOTE — Assessment & Plan Note (Signed)
Following with Dr. Shaaron Adler

## 2019-05-08 NOTE — Assessment & Plan Note (Signed)
Discussed differences between allergies, virus, and bacterial infections  Symptoms consistent with allergies  Start flonase daily   Call if no better  otc cough syrup prn

## 2019-05-08 NOTE — Assessment & Plan Note (Signed)
Patient new to the practice  Previous pcp has moved  She sees dr. Sullivan Lone for her lupus who orders routine labs, but unsure what all is drawn- these have been requested  Reviewed HM

## 2019-07-01 ENCOUNTER — Ambulatory Visit
Admit: 2019-07-01 | Discharge: 2019-07-01 | Payer: PRIVATE HEALTH INSURANCE | Attending: Gerontology | Primary: Gerontology

## 2019-07-01 ENCOUNTER — Encounter

## 2019-07-01 DIAGNOSIS — Z0000 Encounter for general adult medical examination without abnormal findings: Secondary | ICD-10-CM

## 2019-07-01 NOTE — Assessment & Plan Note (Signed)
Stable, controlled  No changes  Continue current treatment plan

## 2019-07-01 NOTE — Assessment & Plan Note (Signed)
Well exam in office  Labs ordered  HM reviewed   Will go to gyn for pap

## 2019-07-01 NOTE — Assessment & Plan Note (Signed)
Labs with Dr. Sullivan Lone

## 2019-07-01 NOTE — Assessment & Plan Note (Signed)
Follows with Dr. Sullivan Lone

## 2019-07-01 NOTE — Progress Notes (Signed)
Subjective:      Patient ID: Molly [name] is a 58 y.o. y.o. female.    HPI    Molly [name] is here for a physical.  She sees Dr. Sullivan Lone for her lupus  She has not had a routine PCP in many years  She states that she does well with her pain with management from Dr. Sullivan Lone.   She states that her only concern today is related to pain in her right leg that starts in her groin and radiates down her thigh   She has no history of blood clots   She states that she has had varicose veins that cause pain often  She is on her feet all day at work.   Chief Complaint   Patient presents with   ??? Annual Exam       Vitals:    07/01/19 0959   BP: 124/68   Pulse: 70   Temp: 96 ??F (35.6 ??C)   SpO2: 98%       Wt Readings from Last 3 Encounters:   07/01/19 (!) 315 lb (142.9 kg)   05/02/17 294 lb (133.4 kg)   12/27/16 293 lb (132.9 kg)       Past Medical History:   Diagnosis Date   ??? Amenorrhea    ??? Cancer (HCC)    ??? History of non-Hodgkin's lymphoma 2006    treated with radiation and chemo   ??? Obesity    ??? Systemic lupus     followed by Dr. Harley Hallmark       Past Surgical History:   Procedure Laterality Date   ??? JOINT REPLACEMENT     ??? KNEE ARTHROPLASTY Left 10/25/2016    left total knee replacement using computer navigation [facility])   ??? KNEE SURGERY Left     ACL and meniscus repair   ??? LEEP      in 1990's       Family History   Problem Relation Age of Onset   ??? Dementia Mother    ??? Thyroid Disease Mother    ??? Other Father         paraplegic after farming accident   ??? High Cholesterol Sister    ??? Cancer Sister 60        Breast CA   ??? Thyroid Disease Sister    ??? High Cholesterol Brother    ??? High Cholesterol Brother    ??? Rheum Arthritis Neg Hx    ??? Osteoarthritis Neg Hx    ??? Asthma Neg Hx    ??? Breast Cancer Neg Hx    ??? Diabetes Neg Hx    ??? Heart Failure Neg Hx    ??? Hypertension Neg Hx    ??? Migraines Neg Hx    ??? Ovarian Cancer Neg Hx    ??? Rashes/Skin Problems Neg Hx    ??? Seizures Neg Hx    ??? Stroke Neg Hx        Social History      Tobacco Use   ??? Smoking status: Former Smoker     Packs/day: 0.50     Years: 18.00     Pack years: 9.00     Types: Cigarettes     Last attempt to quit: 2008     Years since quitting: 12.8   ??? Smokeless tobacco: Never Used   Substance Use Topics   ??? Alcohol use: Yes     Alcohol/week: 1.0 standard drinks  Types: 1 Shots of liquor per week     Comment: 2x/week   ??? Drug use: No       Immunization History   Administered Date(s) Administered   ??? Tdap (Boostrix, Adacel) 02/05/2013       Health Maintenance   Topic Date Due   ??? Hepatitis C screen  08/18/1962   ??? HIV screen  07/14/1977   ??? Shingles Vaccine (1 of 2) 07/14/2012   ??? Colon cancer screen colonoscopy  07/14/2012   ??? Breast cancer screen  07/07/2016   ??? Cervical cancer screen  12/02/2016   ??? Lipid screen  04/25/2019   ??? Flu vaccine (1) 06/30/2020 (Originally 04/23/2019)   ??? Diabetes screen  09/21/2019   ??? DTaP/Tdap/Td vaccine (3 - Td) 02/06/2023   ??? Hepatitis A vaccine  Aged Out   ??? Hepatitis B vaccine  Aged Out   ??? Hib vaccine  Aged Out   ??? Meningococcal (ACWY) vaccine  Aged Out   ??? Pneumococcal 0-64 years Vaccine  Aged Out       Discussed over the counter medication with patient.    Discussed use, benefit, and side effects of prescribed medications.  Barriers to medication compliance addressed.  Allpatient questions answered.  Pt voiced understanding.     Medications reviewed and patient understands.  Questions answered    Review of Systems   Constitutional: Negative for chills, fatigue and fever.   HENT: Negative for congestion, ear pain, postnasal drip, rhinorrhea, sinus pressure, sneezing and sore throat.    Eyes: Negative for redness and itching.   Respiratory: Negative for cough, chest tightness, shortness of breath and wheezing.    Cardiovascular: Negative for chest pain and palpitations.   Gastrointestinal: Negative for abdominal pain, blood in stool, constipation, diarrhea, nausea and vomiting.   Endocrine: Negative for cold intolerance and heat  intolerance.   Genitourinary: Negative for difficulty urinating, dysuria, flank pain, frequency, hematuria and urgency.   Musculoskeletal: Negative for arthralgias, back pain, joint swelling and myalgias.   Skin: Negative for color change, pallor, rash and wound.   Allergic/Immunologic: Negative for environmental allergies and food allergies.   Neurological: Negative for dizziness, seizures, syncope, weakness, light-headedness, numbness and headaches.   Hematological: Negative for adenopathy. Does not bruise/bleed easily.   Psychiatric/Behavioral: Negative for confusion, sleep disturbance and suicidal ideas. The patient is not nervous/anxious and is not hyperactive.        Objective:   Physical Exam  Constitutional:       Appearance: She is well-developed.   HENT:      Head: Normocephalic and atraumatic.      Right Ear: Hearing, tympanic membrane, ear canal and external ear normal.      Left Ear: Hearing, tympanic membrane, ear canal and external ear normal.      Nose: Nose normal. No mucosal edema or rhinorrhea.      Right Sinus: No maxillary sinus tenderness or frontal sinus tenderness.      Left Sinus: No maxillary sinus tenderness or frontal sinus tenderness.      Mouth/Throat:      Pharynx: No oropharyngeal exudate or posterior oropharyngeal erythema.      Tonsils: No tonsillar abscesses.   Neck:      Musculoskeletal: Normal range of motion and neck supple.      Vascular: No JVD.   Cardiovascular:      Rate and Rhythm: Normal rate and regular rhythm.      Heart sounds: Normal heart sounds. No murmur.  No friction rub. No gallop.    Pulmonary:      Effort: Pulmonary effort is normal. No respiratory distress.      Breath sounds: Normal breath sounds. No wheezing.   Chest:      Chest wall: No tenderness.   Abdominal:      General: Bowel sounds are normal. There is no distension.      Palpations: Abdomen is soft. There is no mass.      Tenderness: There is no abdominal tenderness. There is no guarding or rebound.    Musculoskeletal: Normal range of motion.         General: No tenderness or deformity.   Lymphadenopathy:      Head:      Right side of head: No submental, submandibular, tonsillar, preauricular, posterior auricular or occipital adenopathy.      Left side of head: No submental, submandibular, tonsillar, preauricular, posterior auricular or occipital adenopathy.      Cervical: No cervical adenopathy.   Skin:     General: Skin is warm and dry.      Findings: No erythema or rash.   Neurological:      Mental Status: She is alert and oriented to person, place, and time.   Psychiatric:         Judgment: Judgment normal.         Assessment:      See ProblemList assessment and plan       Plan:      Routine general medical examination at a health care facility  Well exam in office  Labs ordered  HM reviewed   Will go to gyn for pap     HLD (hyperlipidemia)  Stable, controlled  No changes  Continue current treatment plan       High risk medication use  Labs with Dr. Sullivan LoneMousa    Systemic lupus erythematosus (HCC)  Follows with Dr. Sullivan LoneMousa         Patient engaged in shared decision making. Information given to evaluate options of treatment, understand what is needed and discuss importance of following plan.

## 2019-07-02 LAB — LIPID, FASTING
Cholesterol, Fasting: 238 mg/dL — ABNORMAL HIGH (ref 0–199)
HDL: 55 mg/dL (ref 40–60)
LDL Calculated: 149 mg/dL — ABNORMAL HIGH (ref <100)
Triglyceride, Fasting: 170 mg/dL — ABNORMAL HIGH (ref 0–150)
VLDL Cholesterol Calculated: 34 mg/dL

## 2019-07-02 LAB — VITAMIN D 25 HYDROXY: Vit D, 25-Hydroxy: 15.1 ng/mL — ABNORMAL LOW (ref >=30)

## 2019-07-02 LAB — TSH WITH REFLEX: TSH: 1.89 u[IU]/mL (ref 0.27–4.20)

## 2019-07-24 NOTE — Telephone Encounter (Signed)
From: Lambert Keto  To: Lindell Spar, APRN - CNP  Sent: 07/24/2019 3:40 PM EST  Subject: Test Results Question    I need a prescription for the Lipitor as recommended.     I am not on a high does Vit D supplement. I take D3 2000 IU daily. Should I increase ?     Thanks

## 2019-07-25 MED ORDER — VITAMIN D (ERGOCALCIFEROL) 1.25 MG (50000 UT) PO CAPS
1.25 MG (50000 UT) | ORAL_CAPSULE | ORAL | 5 refills | Status: DC
Start: 2019-07-25 — End: 2020-12-30

## 2019-07-25 MED ORDER — ATORVASTATIN CALCIUM 40 MG PO TABS
40 MG | ORAL_TABLET | Freq: Every day | ORAL | 3 refills | Status: DC
Start: 2019-07-25 — End: 2020-12-30

## 2019-12-30 ENCOUNTER — Encounter: Attending: Gerontology | Primary: Gerontology

## 2020-06-09 ENCOUNTER — Encounter

## 2020-06-09 NOTE — Telephone Encounter (Signed)
From: Timmothy Sours  To: Fredrik Rigger, APRN - CNP  Sent: 06/09/2020 1:21 PM EDT  Subject: Prescription Question    I would like to get a home colon screening kit . Is this something I can get from you? I have already scheduled a mammogram for November . Thanks

## 2020-06-24 ENCOUNTER — Encounter

## 2020-12-30 ENCOUNTER — Ambulatory Visit: Admit: 2020-12-30 | Discharge: 2020-12-30 | Payer: MEDICAID | Attending: Gerontology | Primary: Gerontology

## 2020-12-30 DIAGNOSIS — J019 Acute sinusitis, unspecified: Secondary | ICD-10-CM

## 2020-12-30 MED ORDER — ALBUTEROL SULFATE HFA 108 (90 BASE) MCG/ACT IN AERS
10890 (90 Base) MCG/ACT | Freq: Four times a day (QID) | RESPIRATORY_TRACT | 1 refills | Status: AC | PRN
Start: 2020-12-30 — End: ?

## 2020-12-30 MED ORDER — AMOXICILLIN-POT CLAVULANATE 875-125 MG PO TABS
875-125 MG | ORAL_TABLET | Freq: Two times a day (BID) | ORAL | 0 refills | Status: DC
Start: 2020-12-30 — End: 2020-12-31

## 2020-12-30 MED ORDER — AZITHROMYCIN 250 MG PO TABS
250 MG | ORAL_TABLET | ORAL | 0 refills | Status: DC
Start: 2020-12-30 — End: 2020-12-31

## 2020-12-30 MED ORDER — METHYLPREDNISOLONE 4 MG PO TBPK
4 MG | PACK | ORAL | 0 refills | Status: AC
Start: 2020-12-30 — End: 2021-01-05

## 2020-12-30 NOTE — Progress Notes (Signed)
Molly [name] (DOB:  30-Nov-1960) is a 60 y.o. female,Established patient, here for evaluation of the following chief complaint(s):  Cough (green mucous)      ASSESSMENT/PLAN:  1. Acute bacterial sinusitis  Assessment & Plan:   Continue with symptomatic management. Recommend increased water intake as well as saline irrigation, mucolytics, and antihistamines as needed. Advised to call back if no improvement over the next 4-7 days.   zpack  Medrol   Check home covid   Orders:  -     azithromycin (ZITHROMAX) 250 MG tablet; Take 1 tablet by mouth See Admin Instructions for 5 days 581m on day 1 followed by 2585mon days 2 - 5, Disp-6 tablet, R-0Normal      No follow-ups on file.    SUBJECTIVE/OBJECTIVE:  HPI  Patient the office today with reports of coughing green mucus and not feeling well for the past week.  She states that she feels in her chest and is feeling somewhat short of breath.  Her vital signs are stable on evaluation.  She states that she has been taking over-the-counter medications without any improvement.  She denies any fevers or generalized body aches.  [place] She has not tested for COVID however.  She has not had any other sick contacts.  She states that she has no sinus pain or pressure but feels things dripping down into her chest.  Current Outpatient Medications   Medication Sig Dispense Refill   ??? azithromycin (ZITHROMAX) 250 MG tablet Take 1 tablet by mouth See Admin Instructions for 5 days 50061mn day 1 followed by 250m5m days 2 - 5 6 tablet 0   ??? methylPREDNISolone (MEDROL DOSEPACK) 4 MG tablet Take by mouth. 1 kit 0   ??? albuterol sulfate HFA (VENTOLIN HFA) 108 (90 Base) MCG/ACT inhaler Inhale 2 puffs into the lungs 4 times daily as needed for Wheezing 54 g 1   ??? hydroxychloroquine (PLAQUENIL) 200 MG tablet Take 400 mg by mouth daily     ??? vitamin D (CHOLECALCIFEROL) 1000 UNIT TABS tablet Take 1,000 Units by mouth daily Twice a day     ??? DULoxetine  (CYMBALTA) 60 MG extended release capsule Take 1 capsule by mouth daily 30 capsule 5   ??? nabumetone (RELAFEN) 500 MG tablet Take 1 tablet by mouth 2 times daily 60 tablet 5     No current facility-administered medications for this visit.       Review of Systems   Constitutional: Negative for chills, fatigue and fever.   HENT: Positive for congestion and postnasal drip. Negative for ear pain, rhinorrhea, sinus pressure, sneezing and sore throat.    Eyes: Negative for redness and itching.   Respiratory: Positive for cough and shortness of breath. Negative for chest tightness and wheezing.    Cardiovascular: Negative for chest pain and palpitations.   Gastrointestinal: Negative for abdominal pain, blood in stool, constipation, diarrhea, nausea and vomiting.   Endocrine: Negative for cold intolerance and heat intolerance.   Genitourinary: Negative for difficulty urinating, dysuria, flank pain, frequency, hematuria and urgency.   Musculoskeletal: Negative for arthralgias, back pain, joint swelling and myalgias.   Skin: Negative for color change, pallor, rash and wound.   Allergic/Immunologic: Negative for environmental allergies and food allergies.   Neurological: Negative for dizziness, seizures, syncope, weakness, light-headedness, numbness and headaches.   Hematological: Negative for adenopathy. Does not bruise/bleed easily.   Psychiatric/Behavioral: Negative for confusion, sleep disturbance and suicidal ideas. The  patient is not nervous/anxious and is not hyperactive.        Vitals:    12/30/20 1217   BP: 118/76   Pulse: 104   Temp: 97.1 ??F (36.2 ??C)   SpO2: 96%   Weight: (!) 320 lb (145.2 kg)   Height: 5' 10" (1.778 m)       Physical Exam  Constitutional:       Appearance: Normal appearance. She is well-developed.   HENT:      Head: Normocephalic and atraumatic.      Right Ear: Hearing normal.      Left Ear: Hearing normal.      Nose: No mucosal edema.      Right Sinus: No maxillary sinus tenderness or frontal sinus  tenderness.      Left Sinus: No maxillary sinus tenderness or frontal sinus tenderness.      Mouth/Throat:      Tonsils: No tonsillar abscesses.   Eyes:      Extraocular Movements: Extraocular movements intact.      Pupils: Pupils are equal, round, and reactive to light.   Cardiovascular:      Rate and Rhythm: Normal rate and regular rhythm.      Pulses: Normal pulses.      Heart sounds: Normal heart sounds.   Pulmonary:      Effort: Pulmonary effort is normal.      Breath sounds: Normal breath sounds.   Lymphadenopathy:      Head:      Right side of head: No submental, submandibular, tonsillar, preauricular, posterior auricular or occipital adenopathy.      Left side of head: No submental, submandibular, tonsillar, preauricular, posterior auricular or occipital adenopathy.   Skin:     General: Skin is warm and dry.   Neurological:      Mental Status: She is alert.   Psychiatric:         Mood and Affect: Mood normal.         Behavior: Behavior normal.           On this date 12/30/2020 I have spent 30 minutes reviewing previous notes, test results and face to face with the patient discussing the diagnosis and importance of compliance with the treatment plan as well as documenting on the day of the visit.      An electronic signature was used to authenticate this note.    --Fredrik Rigger, APRN - CNP

## 2020-12-30 NOTE — Telephone Encounter (Signed)
augmentin sent instead. Please let patient know

## 2020-12-30 NOTE — Telephone Encounter (Signed)
[facility] called to say that azithromycin and Plaquenil. These together can cause QTC prolongation.    Please call pharmacy.    LOV 12/30/2020

## 2020-12-30 NOTE — Telephone Encounter (Signed)
From: MA Jordin L  To: Lambert Keto  Sent: 12/30/2020 4:24 PM EDT  Subject: Antibiotic    Hi Grier,    I tried leaving you a voice message but your mailbox is full. Originally, Lauren prescribed you Zithromax for your symptoms, but she now wants you to take Augmentin instead. The pharmacy called and said the Zithromax might interact with one of your current medications. [facility]. You are still to take 1 tablet, twice a day for the next week. If your symptoms do not get better or get worse after the course of antibiotics, please call our office [phone].     Jordin

## 2020-12-30 NOTE — Telephone Encounter (Signed)
Unable to LVM, sent MyChart instead.

## 2020-12-30 NOTE — Assessment & Plan Note (Signed)
Continue with symptomatic management. Recommend increased water intake as well as saline irrigation, mucolytics, and antihistamines as needed. Advised to call back if no improvement over the next 4-7 days.   zpack  Medrol   Check home covid

## 2020-12-31 MED ORDER — AMOXICILLIN-POT CLAVULANATE 875-125 MG PO TABS
875-125 MG | ORAL_TABLET | Freq: Two times a day (BID) | ORAL | 0 refills | Status: AC
Start: 2020-12-31 — End: 2021-01-07

## 2020-12-31 NOTE — Telephone Encounter (Signed)
Pt called and stated that the Augmentin did not get sent to the pharmacy.    Please resend to:  [id number] - [place], [place] - [address] Molly [name] [phone] - F [phone]    LOV: 12/30/20

## 2021-01-19 NOTE — Telephone Encounter (Signed)
From: Lambert Keto  To: Lindell Spar  Sent: 01/18/2021 9:27 PM EDT  Subject: Cough & mucus    Hi Lauren! Finished course of antibiotics ( they did give me a yeast infection surprise) & steroids. felt much better until today. Cough returned and mucus is starting to come back in lungs. Can we do anything further?

## 2021-01-29 ENCOUNTER — Encounter

## 2021-01-29 ENCOUNTER — Inpatient Hospital Stay: Payer: MEDICAID | Primary: Gerontology

## 2021-01-29 ENCOUNTER — Inpatient Hospital Stay: Admit: 2021-01-29 | Payer: MEDICAID | Primary: Gerontology

## 2021-01-29 ENCOUNTER — Ambulatory Visit: Admit: 2021-01-29 | Discharge: 2021-01-29 | Payer: MEDICAID | Attending: Gerontology | Primary: Gerontology

## 2021-01-29 DIAGNOSIS — R0602 Shortness of breath: Secondary | ICD-10-CM

## 2021-01-29 DIAGNOSIS — I517 Cardiomegaly: Secondary | ICD-10-CM

## 2021-01-29 LAB — CBC WITH AUTO DIFFERENTIAL
Basophils %: 0.5 %
Basophils Absolute: 0 10*3/uL (ref 0.0–0.2)
Eosinophils %: 0.5 %
Eosinophils Absolute: 0 10*3/uL (ref 0.0–0.6)
Hematocrit: 37.9 % (ref 36.0–48.0)
Hemoglobin: 12.7 g/dL (ref 12.0–16.0)
Lymphocytes %: 19 %
Lymphocytes Absolute: 1.7 10*3/uL (ref 1.0–5.1)
MCH: 31.2 pg (ref 26.0–34.0)
MCHC: 33.4 g/dL (ref 31.0–36.0)
MCV: 93.5 fL (ref 80.0–100.0)
MPV: 7.8 fL (ref 5.0–10.5)
Monocytes %: 15.6 %
Monocytes Absolute: 1.4 10*3/uL — ABNORMAL HIGH (ref 0.0–1.3)
Neutrophils %: 64.4 %
Neutrophils Absolute: 5.8 10*3/uL (ref 1.7–7.7)
Platelets: 346 10*3/uL (ref 135–450)
RBC: 4.06 M/uL (ref 4.00–5.20)
RDW: 14.5 % (ref 12.4–15.4)
WBC: 9.1 10*3/uL (ref 4.0–11.0)

## 2021-01-29 LAB — COMPREHENSIVE METABOLIC PANEL, FASTING
ALT: 78 U/L — ABNORMAL HIGH (ref 10–40)
AST: 61 U/L — ABNORMAL HIGH (ref 15–37)
Albumin/Globulin Ratio: 1.2 (ref 1.1–2.2)
Albumin: 4 g/dL (ref 3.4–5.0)
Alkaline Phosphatase: 197 U/L — ABNORMAL HIGH (ref 40–129)
Anion Gap: 15 (ref 3–16)
BUN: 11 mg/dL (ref 7–20)
CO2: 23 mmol/L (ref 21–32)
Calcium: 9.2 mg/dL (ref 8.3–10.6)
Chloride: 99 mmol/L (ref 99–110)
Creatinine: 0.6 mg/dL (ref 0.6–1.1)
GFR African American: >60 (ref >60)
GFR Non-African American: >60 (ref >60)
Glucose, Fasting: 125 mg/dL — ABNORMAL HIGH (ref 70–99)
Potassium: 4.5 mmol/L (ref 3.5–5.1)
Sodium: 137 mmol/L (ref 136–145)
Total Bilirubin: 0.9 mg/dL (ref 0.0–1.0)
Total Protein: 7.3 g/dL (ref 6.4–8.2)

## 2021-01-29 LAB — BRAIN NATRIURETIC PEPTIDE: Pro-BNP: 7646 pg/mL — ABNORMAL HIGH (ref 0–124)

## 2021-01-29 MED ORDER — PREDNISONE 20 MG PO TABS
20 MG | ORAL_TABLET | Freq: Every day | ORAL | 0 refills | Status: AC
Start: 2021-01-29 — End: 2021-02-08

## 2021-01-29 NOTE — Assessment & Plan Note (Signed)
Retest for COVID order sent to lab we will also check CBC, CMP, D-dimer.  Chest x-ray ordered to have this all completed today.  Prednisone for shortness of breath sent to pharmacy further recommendations pending results.

## 2021-01-29 NOTE — Progress Notes (Signed)
Molly [name] (DOB:  1961-04-22) is a 60 y.o. female,Established patient, here for evaluation of the following chief complaint(s):  Other (cough)      ASSESSMENT/PLAN:  1. SOB (shortness of breath)  -     COVID-19; Future  -     XR CHEST STANDARD (2 VW); Future  -     D-Dimer, Quantitative; Future  -     CBC with Auto Differential; Future  -     Comprehensive Metabolic Panel, Fasting; Future      No follow-ups on file.    SUBJECTIVE/OBJECTIVE:  HPI  The office today with reports of ongoing shortness of breath and fatigue.  She was recently seen a couple weeks ago and diagnosed with a sinus infection and treated with a Z-Pak and Medrol Dosepak.  At that time she was told to go home and check a COVID test which was negative.  Today she is feeling worse more short of breath.  She is not having any chest pain or wheezing but feels that she is just completely exhausted.  She has nausea and vomiting from coughing so hard.  She completed the medications and initially started to feel better but now symptoms have returned.  She has not tested again for COVID.  She states that when she is up and moving she is more fatigued.  Upon ambulation her oxygen is at 89% but when she recovers up to 99% on room air.  Current Outpatient Medications   Medication Sig Dispense Refill   ??? predniSONE (DELTASONE) 20 MG tablet Take 1 tablet by mouth daily for 10 days 10 tablet 0   ??? albuterol sulfate HFA (VENTOLIN HFA) 108 (90 Base) MCG/ACT inhaler Inhale 2 puffs into the lungs 4 times daily as needed for Wheezing 54 g 1   ??? hydroxychloroquine (PLAQUENIL) 200 MG tablet Take 400 mg by mouth daily     ??? DULoxetine (CYMBALTA) 60 MG extended release capsule Take 1 capsule by mouth daily 30 capsule 5   ??? nabumetone (RELAFEN) 500 MG tablet Take 1 tablet by mouth 2 times daily 60 tablet 5   ??? vitamin D (CHOLECALCIFEROL) 1000 UNIT TABS tablet Take 1,000 Units by mouth daily Twice a day (Patient not taking: Reported on 01/29/2021)       No current  facility-administered medications for this visit.       Review of Systems   Constitutional: Positive for fatigue. Negative for chills and fever.   HENT: Negative for congestion, ear pain, postnasal drip, rhinorrhea, sinus pressure, sneezing and sore throat.    Eyes: Negative for redness and itching.   Respiratory: Positive for shortness of breath. Negative for cough, chest tightness and wheezing.    Cardiovascular: Negative for chest pain and palpitations.   Gastrointestinal: Negative for abdominal pain, blood in stool, constipation, diarrhea, nausea and vomiting.   Endocrine: Negative for cold intolerance and heat intolerance.   Genitourinary: Negative for difficulty urinating, dysuria, flank pain, frequency, hematuria and urgency.   Musculoskeletal: Negative for arthralgias, back pain, joint swelling and myalgias.   Skin: Negative for color change, pallor, rash and wound.   Allergic/Immunologic: Negative for environmental allergies and food allergies.   Neurological: Negative for dizziness, seizures, syncope, weakness, light-headedness, numbness and headaches.   Hematological: Negative for adenopathy. Does not bruise/bleed easily.   Psychiatric/Behavioral: Negative for confusion, sleep disturbance and suicidal ideas. The patient is not nervous/anxious and is not hyperactive.        Vitals:    01/29/21 1131  BP: 128/86   Pulse: 79   Temp: 97 ??F (36.1 ??C)   SpO2: (!) 89%   Weight: (!) 331 lb (150.1 kg)       Physical Exam  Constitutional:       Appearance: Normal appearance. She is well-developed.   HENT:      Head: Normocephalic and atraumatic.      Right Ear: Hearing normal.      Left Ear: Hearing normal.      Nose: No mucosal edema.      Right Sinus: No maxillary sinus tenderness or frontal sinus tenderness.      Left Sinus: No maxillary sinus tenderness or frontal sinus tenderness.      Mouth/Throat:      Tonsils: No tonsillar abscesses.   Eyes:      Extraocular Movements: Extraocular movements intact.       Pupils: Pupils are equal, round, and reactive to light.   Cardiovascular:      Rate and Rhythm: Normal rate and regular rhythm.      Pulses: Normal pulses.      Heart sounds: Normal heart sounds.   Pulmonary:      Effort: Pulmonary effort is normal.      Breath sounds: Normal breath sounds.   Lymphadenopathy:      Head:      Right side of head: No submental, submandibular, tonsillar, preauricular, posterior auricular or occipital adenopathy.      Left side of head: No submental, submandibular, tonsillar, preauricular, posterior auricular or occipital adenopathy.   Skin:     General: Skin is warm and dry.   Neurological:      Mental Status: She is alert.   Psychiatric:         Mood and Affect: Mood normal.         Behavior: Behavior normal.           On this date 01/29/2021 I have spent 30 minutes reviewing previous notes, test results and face to face with the patient discussing the diagnosis and importance of compliance with the treatment plan as well as documenting on the day of the visit.      An electronic signature was used to authenticate this note.    --Lindell Spar, APRN - CNP

## 2021-01-31 LAB — COVID-19: SARS-CoV-2: NOT DETECTED

## 2021-02-01 ENCOUNTER — Ambulatory Visit: Admit: 2021-02-01 | Discharge: 2021-02-01 | Payer: MEDICAID | Attending: Gerontology | Primary: Gerontology

## 2021-02-01 DIAGNOSIS — I509 Heart failure, unspecified: Secondary | ICD-10-CM

## 2021-02-01 MED ORDER — SACUBITRIL-VALSARTAN 24-26 MG PO TABS
24-26 MG | ORAL_TABLET | Freq: Two times a day (BID) | ORAL | 3 refills | Status: DC
Start: 2021-02-01 — End: 2021-02-08

## 2021-02-01 MED ORDER — FUROSEMIDE 40 MG PO TABS
40 MG | ORAL_TABLET | Freq: Every day | ORAL | 3 refills | Status: DC
Start: 2021-02-01 — End: 2021-05-28

## 2021-02-01 MED ORDER — SPIRONOLACTONE 25 MG PO TABS
25 MG | ORAL_TABLET | Freq: Every day | ORAL | 3 refills | Status: DC
Start: 2021-02-01 — End: 2021-03-03

## 2021-02-01 NOTE — Telephone Encounter (Signed)
From: Lambert Keto  To: Lindell Spar  Sent: 02/01/2021 3:31 PM EDT  Subject: Echocardiogram     Is your office going to make the call to move test up from 6/1 ? Or did I have to do this

## 2021-02-01 NOTE — Telephone Encounter (Signed)
error 

## 2021-02-01 NOTE — Progress Notes (Signed)
Molly [name] (DOB:  01-05-1961) is a 60 y.o. female,Established patient, here for evaluation of the following chief complaint(s):  Other (go over test results)      ASSESSMENT/PLAN:  1. Acute on chronic congestive heart failure, unspecified heart failure type [facility])  Assessment & Plan:   At this time unknown congestive heart failure type, however, patient symptoms suggestive of systolic heart failure.  Start Lasix 40 mg daily given elevated BNP also start Entresto 1 tablet twice a day.  She is not previously on any ACE or ARB so no need to hold her former treatment.  Start Aldactone 25 mg.  She will follow-up with labs in 2 weeks to evaluate tolerance of medications. Referral to Dr. Ardelle Anton. ECHO scheduled.   Orders:  -     [place] - Alfonso Patten, MD, [facility], [facility]  -     Comprehensive Metabolic Panel, Fasting; Future  -     Brain Natriuretic Peptide; Future      No follow-ups on file.    SUBJECTIVE/OBJECTIVE:  HPI  Patient the office today to review test results.  She recently was in the office for shortness of breath a chest x-ray and labs were completed.  The chest x-ray shows cardiomegaly and her blood work showed a BNP of over 7000.  She states she continues to have shortness of breath but reports that her mucus congestion she was having last week is improved.  She has no former history of congestive heart failure.  She has an echocardiogram scheduled for July 1.  She has never seen a cardiologist previously.  Her blood pressure is stableToday with a normal heart rate she denies any pain anywhere.  No chest pain.  She states she is short of breath and has noticed significant weight gain.    Current Outpatient Medications   Medication Sig Dispense Refill   ??? furosemide (LASIX) 40 MG tablet Take 1 tablet by mouth daily 60 tablet 3   ??? sacubitril-valsartan (ENTRESTO) 24-26 MG per tablet Take 1 tablet by mouth 2 times daily 60 tablet 3   ??? spironolactone (ALDACTONE) 25 MG tablet Take 1 tablet by mouth  daily 30 tablet 3   ??? predniSONE (DELTASONE) 20 MG tablet Take 1 tablet by mouth daily for 10 days 10 tablet 0   ??? albuterol sulfate HFA (VENTOLIN HFA) 108 (90 Base) MCG/ACT inhaler Inhale 2 puffs into the lungs 4 times daily as needed for Wheezing 54 g 1   ??? hydroxychloroquine (PLAQUENIL) 200 MG tablet Take 400 mg by mouth daily     ??? DULoxetine (CYMBALTA) 60 MG extended release capsule Take 1 capsule by mouth daily 30 capsule 5   ??? nabumetone (RELAFEN) 500 MG tablet Take 1 tablet by mouth 2 times daily 60 tablet 5   ??? vitamin D (CHOLECALCIFEROL) 1000 UNIT TABS tablet Take 1,000 Units by mouth daily Twice a day        No current facility-administered medications for this visit.       Review of Systems   Constitutional: Negative for chills, fatigue and fever.   HENT: Negative for congestion, ear pain, postnasal drip, rhinorrhea, sinus pressure, sneezing and sore throat.    Eyes: Negative for redness and itching.   Respiratory: Positive for cough and shortness of breath. Negative for chest tightness and wheezing.    Cardiovascular: Positive for leg swelling. Negative for chest pain and palpitations.   Gastrointestinal: Negative for abdominal pain, blood in stool, constipation, diarrhea, nausea and vomiting.  Endocrine: Negative for cold intolerance and heat intolerance.   Genitourinary: Negative for difficulty urinating, dysuria, flank pain, frequency, hematuria and urgency.   Musculoskeletal: Negative for arthralgias, back pain, joint swelling and myalgias.   Skin: Negative for color change, pallor, rash and wound.   Allergic/Immunologic: Negative for environmental allergies and food allergies.   Neurological: Negative for dizziness, seizures, syncope, weakness, light-headedness, numbness and headaches.   Hematological: Negative for adenopathy. Does not bruise/bleed easily.   Psychiatric/Behavioral: Negative for confusion, sleep disturbance and suicidal ideas. The patient is not nervous/anxious and is not  hyperactive.        Vitals:    02/01/21 1442   BP: 126/84   Pulse: 95   Temp: 96.8 ??F (36 ??C)   SpO2: 96%   Weight: (!) 336 lb (152.4 kg)       Physical Exam  Constitutional:       Appearance: Normal appearance. She is well-developed.   HENT:      Head: Normocephalic and atraumatic.      Right Ear: Hearing normal.      Left Ear: Hearing normal.      Nose: No mucosal edema.      Right Sinus: No maxillary sinus tenderness or frontal sinus tenderness.      Left Sinus: No maxillary sinus tenderness or frontal sinus tenderness.      Mouth/Throat:      Tonsils: No tonsillar abscesses.   Eyes:      Extraocular Movements: Extraocular movements intact.      Pupils: Pupils are equal, round, and reactive to light.   Cardiovascular:      Rate and Rhythm: Normal rate and regular rhythm.      Pulses: Normal pulses.      Heart sounds: Normal heart sounds.   Pulmonary:      Effort: Pulmonary effort is normal.      Breath sounds: Normal breath sounds.   Musculoskeletal:      Right lower leg: 1+ Edema present.      Left lower leg: 2+ Edema present.   Lymphadenopathy:      Head:      Right side of head: No submental, submandibular, tonsillar, preauricular, posterior auricular or occipital adenopathy.      Left side of head: No submental, submandibular, tonsillar, preauricular, posterior auricular or occipital adenopathy.   Skin:     General: Skin is warm and dry.   Neurological:      Mental Status: She is alert.   Psychiatric:         Mood and Affect: Mood normal.         Behavior: Behavior normal.           On this date 02/01/2021 I have spent 30 minutes reviewing previous notes, test results and face to face with the patient discussing the diagnosis and importance of compliance with the treatment plan as well as documenting on the day of the visit.      An electronic signature was used to authenticate this note.    --Lindell Spar, APRN - CNP

## 2021-02-01 NOTE — Telephone Encounter (Signed)
Please see if we can get this ECHO scheduled sooner, thank you!

## 2021-02-01 NOTE — Assessment & Plan Note (Signed)
At this time unknown congestive heart failure type, however, patient symptoms suggestive of systolic heart failure.  Start Lasix 40 mg daily given elevated BNP also start Entresto 1 tablet twice a day.  She is not previously on any ACE or ARB so no need to hold her former treatment.  Start Aldactone 25 mg.  She will follow-up with labs in 2 weeks to evaluate tolerance of medications. Referral to Dr. Ardelle Anton. ECHO scheduled.

## 2021-02-01 NOTE — Telephone Encounter (Signed)
Called and spoke with call center to see if Echo can be sooner than 02-19-2021. Per call center, after speaking with [facility], unfortunately test cannot be done sooner secondary to no available appointments at this time.     Please advise on what you'd like to do from here.

## 2021-02-02 NOTE — Telephone Encounter (Signed)
Please call scheduling and see if there is a different location that can get her in sooner. Try [place] please, or [place] [facility]- Molly [name] is the [employer]

## 2021-02-02 NOTE — Telephone Encounter (Signed)
From: Lambert Keto  To: Lindell Spar  Sent: 02/02/2021 4:57 PM EDT  Subject: Valentino Hue    Prescription for Molly [name] has not been approved. Is this something important?

## 2021-02-03 NOTE — Telephone Encounter (Signed)
She has not had her echo yet it is scheduled for 02/19/21, and a cardiologist referral was placed, but does not need to be prescribed by cardiology.

## 2021-02-03 NOTE — Telephone Encounter (Addendum)
Submitted PA for Entresto 24-26MG  tablets  Via CMM Key: [id number] STATUS: PENDING    Insurance wants to know if patient has consulted with a Cardiologist?  They also want to know if patient has a left ventricular ejection fraction of 40% or less?    Please respond to the pool ( P [facility] MEDICATION PRE-AUTH).      Thank you.

## 2021-02-03 NOTE — Telephone Encounter (Signed)
A PA was started for this medication

## 2021-02-03 NOTE — Telephone Encounter (Signed)
Please call the pharmacy and see if this requires a PA or what more we need to do. This is an important medication that needs to be started.

## 2021-02-04 NOTE — Telephone Encounter (Signed)
Appeal for [employer] submitted via fax to [employer] from [facility] Dept.  STATUS: PENDING

## 2021-02-04 NOTE — Telephone Encounter (Signed)
Yes, please do appeal. Thanks

## 2021-02-04 NOTE — Telephone Encounter (Signed)
Received DENIAL for Entresto 24-26MG  tablets.  Denial letter attached.    If this requires a response please respond to the pool ( P [facility] MEDICATION PRE-AUTH).      Thank you please advise patient.

## 2021-02-05 ENCOUNTER — Inpatient Hospital Stay: Admit: 2021-02-05 | Payer: MEDICAID | Primary: Gerontology

## 2021-02-05 DIAGNOSIS — I081 Rheumatic disorders of both mitral and tricuspid valves: Secondary | ICD-10-CM

## 2021-02-05 LAB — ECHOCARDIOGRAM COMPLETE 2D W DOPPLER W COLOR: Left Ventricular Ejection Fraction: 20

## 2021-02-05 MED ORDER — PERFLUTREN LIPID MICROSPHERE IV SUSP
Freq: Once | INTRAVENOUS | Status: DC | PRN
Start: 2021-02-05 — End: 2021-02-06

## 2021-02-05 NOTE — Telephone Encounter (Signed)
In order for [facility] to review the appeal for [facility], patient needs to complete the attached consent form, or they can call the Customer Service number on the back of their ID card to request the appeal verbally.

## 2021-02-05 NOTE — Telephone Encounter (Signed)
Mychart message sent with forms to be completed.

## 2021-02-05 NOTE — Telephone Encounter (Signed)
Left a detailed message for pt to call customer service number on back of card and request a verbal appeal.

## 2021-02-08 MED ORDER — SACUBITRIL-VALSARTAN 24-26 MG PO TABS
24-26 MG | ORAL_TABLET | Freq: Two times a day (BID) | ORAL | 3 refills | Status: DC
Start: 2021-02-08 — End: 2021-02-24

## 2021-02-08 NOTE — Telephone Encounter (Signed)
From: Lambert Keto  To: Lindell Spar  Sent: 02/08/2021 2:30 PM EDT  Subject: Lynne Logan    I have not filled out pAperwork on [place] .   Fluid down some. . Ankles swollen from working on my feet .   Shortness breath not as often .   Still not sleeping through night !    See dr next Tuesday. Anything I can do before that ?    Thanks [employer]

## 2021-02-09 ENCOUNTER — Emergency Department: Admit: 2021-02-09 | Payer: MEDICAID | Primary: Gerontology

## 2021-02-09 ENCOUNTER — Inpatient Hospital Stay
Admission: EM | Admit: 2021-02-09 | Discharge: 2021-02-24 | Disposition: A | Discharge status: Home / Self Care | Payer: MEDICAID | Admitting: Internal Medicine

## 2021-02-09 DIAGNOSIS — I5043 Acute on chronic combined systolic (congestive) and diastolic (congestive) heart failure: Principal | ICD-10-CM

## 2021-02-09 LAB — CBC WITH AUTO DIFFERENTIAL
Basophils %: 0.7 %
Basophils Absolute: 0.1 10*3/uL (ref 0.0–0.2)
Eosinophils %: 1 %
Eosinophils Absolute: 0.1 10*3/uL (ref 0.0–0.6)
Hematocrit: 40.6 % (ref 36.0–48.0)
Hemoglobin: 13.3 g/dL (ref 12.0–16.0)
Lymphocytes %: 31.3 %
Lymphocytes Absolute: 2.8 10*3/uL (ref 1.0–5.1)
MCH: 30.1 pg (ref 26.0–34.0)
MCHC: 32.8 g/dL (ref 31.0–36.0)
MCV: 91.7 fL (ref 80.0–100.0)
MPV: 7.7 fL (ref 5.0–10.5)
Monocytes %: 13.4 %
Monocytes Absolute: 1.2 10*3/uL (ref 0.0–1.3)
Neutrophils %: 53.6 %
Neutrophils Absolute: 4.7 10*3/uL (ref 1.7–7.7)
Platelets: 264 10*3/uL (ref 135–450)
RBC: 4.43 M/uL (ref 4.00–5.20)
RDW: 14.8 % (ref 12.4–15.4)
WBC: 8.8 10*3/uL (ref 4.0–11.0)

## 2021-02-09 LAB — BASIC METABOLIC PANEL W/ REFLEX TO MG FOR LOW K
Anion Gap: 15 (ref 3–16)
BUN: 14 mg/dL (ref 7–20)
CO2: 23 mmol/L (ref 21–32)
Calcium: 9.5 mg/dL (ref 8.3–10.6)
Chloride: 93 mmol/L — ABNORMAL LOW (ref 99–110)
Creatinine: 0.8 mg/dL (ref 0.6–1.1)
GFR African American: >60 (ref >60)
GFR Non-African American: >60 (ref >60)
Glucose: 130 mg/dL — ABNORMAL HIGH (ref 70–99)
Potassium reflex Magnesium: 3.7 mmol/L (ref 3.5–5.1)
Sodium: 131 mmol/L — ABNORMAL LOW (ref 136–145)

## 2021-02-09 LAB — MICROSCOPIC URINALYSIS

## 2021-02-09 LAB — URINALYSIS
Bilirubin Urine: NEGATIVE
Blood, Urine: NEGATIVE
Glucose, Ur: NEGATIVE mg/dL
Ketones, Urine: NEGATIVE mg/dL
Nitrite, Urine: NEGATIVE
Specific Gravity, UA: 1.015 (ref 1.005–1.030)
Urobilinogen, Urine: 0.2 E.U./dL (ref <2.0)
pH, UA: 7 (ref 5.0–8.0)

## 2021-02-09 LAB — EKG 12-LEAD
Atrial Rate: 276 {beats}/min
P Axis: 111 degrees
Q-T Interval: 370 ms
QRS Duration: 96 ms
QTc Calculation (Bazett): 560 ms
R Axis: -34 degrees
T Axis: 181 degrees
Ventricular Rate: 138 {beats}/min

## 2021-02-09 LAB — BLOOD GAS, VENOUS
Base Excess, Ven: 4.9 mmol/L — ABNORMAL HIGH (ref <=3.0)
Carboxyhemoglobin: 1.4 % (ref 0.0–1.5)
HCO3, Venous: 28.6 mmol/L — ABNORMAL HIGH (ref 24.0–28.0)
Hemoglobin, Ven, Reduced: 44.6 %
MetHgb, Ven: 0.3 % (ref 0.0–1.5)
O2 Sat, Ven: 55 %
TC02 (Calc), Ven: 30 mmol/L
pCO2, Ven: 38.2 mmHg — ABNORMAL LOW (ref 41.0–51.0)
pH, Ven: 7.483 — ABNORMAL HIGH (ref 7.350–7.450)
pO2, Ven: <30 mmHg (ref 25.0–40.0)

## 2021-02-09 LAB — BRAIN NATRIURETIC PEPTIDE: Pro-BNP: 4707 pg/mL — ABNORMAL HIGH (ref 0–124)

## 2021-02-09 LAB — TROPONIN: Troponin: <0.01 ng/mL (ref <0.01)

## 2021-02-09 MED ORDER — IOPAMIDOL 76 % IV SOLN
76 % | Freq: Once | INTRAVENOUS | Status: AC | PRN
Start: 2021-02-09 — End: 2021-02-09
  Administered 2021-02-09: 80 mL via INTRAVENOUS

## 2021-02-09 NOTE — Telephone Encounter (Signed)
From: Lambert Keto  Sent: 02/09/2021 9:40 AM EDT  To: [facility]  Subject: entresto    My most concerning issue currently is the coughing and mucus plugs coming up. I cannot lay down or sleep for any length of time. Exhausted . Is that part of this heart issue?

## 2021-02-09 NOTE — Plan of Care (Signed)
Patient direct admit from PCP office for new onset CHF  Patient underwent echo about a week ago and was noted to have a EF of less than 20% per PCP report. Patient was started on p.o. diuretic but patient continued to have persistent shortness of breath.  Patient was referred to cardiology and was scheduled to see heart failure service on June 28th

## 2021-02-09 NOTE — ED Notes (Signed)
Report given to Sue Lush, RN      Nila Nephew, RN  02/09/21 2232

## 2021-02-09 NOTE — ED Triage Notes (Signed)
Patient presents to ED with increased shortness of breath, recent ECHO, resulted EF<20%

## 2021-02-09 NOTE — ED Provider Notes (Signed)
THE [facility]  EMERGENCY DEPARTMENT ENCOUNTER          ATTENDING PHYSICIAN NOTE       Date of evaluation: 02/09/2021    Chief Complaint     Shortness of Breath      History of Present Illness     Molly [name] is a 60 y.o. female with a PMH as described below who presents to the ED today with a chief complaint of: Shortness of breath, cough, and lower extremity swelling.  Patient carries a relatively new diagnosis of CHF with an ejection fraction of 20% on echo shown below that was performed earlier this month.  She is on Lasix 40 mg daily.  She does not know what her dry weight is but feels very swollen in her legs and notes orthopnea, inability to lay flat, shortness of breath with exertion, and cough with occasional mucus-like sputum production.  She called her primary care doctor and was directed to the emergency room today.  She denies any significant acute change over the last few days but states that it has been getting worse for the past 30 days finally to the point where she is unable to tolerate it.  She denies any abdominal pain, nausea, vomiting, diarrhea, fever, chills, or dysuria.    The patientstates that there were no other associated signs and symptoms or modifying factors.    Patient rates pain as 0/10.    Summary   Left ventricular cavity size is severely dilated.   Normal left ventricular wall thickness.   Ejection fraction is visually estimated to be <20%.   There is severe diffuse hypokinesis.   Global Longitiudinal Strain= -5.0%   Grade III diastolic dysfunction with elevated LV filling pressures.   Moderate thickening of leaflets of mitral valve.   moderate mitral regurgitation.   Mildly dilated right ventricle.   Right ventricular systolic function is severely reduced.   The tricuspid valve was not well visualized.   moderate tricuspid regurgitation.   The right atrium is moderately dilated.   IVC size is dilated (>2.1 cm) and collapses < 50% with respiration   consistent with  elevated RA pressure (15 mmHg).   Estimated pulmonary artery systolic pressure is mildly elevated at 40 mmHg   assuming a right atrial pressure of 15 mmHg.    Review of Systems     As described above in the HPI otherwise all the systems reviewed and negative as reported by the patient.    Past Medical, Surgical, Family, and Social History     She has a past medical history of Amenorrhea, Cancer (HCC), History of non-Hodgkin's lymphoma, Obesity, and Systemic lupus.  She has a past surgical history that includes LEEP; knee surgery (Left); Knee Arthroplasty (Left, 10/25/2016); and joint replacement.  Her family history includes Cancer (age of onset: 72) in her sister; Dementia in her mother; High Cholesterol in her brother, brother, and sister; Other in her father; Thyroid Disease in her mother and sister.  She reports that she quit smoking about 14 years ago. Her smoking use included cigarettes. She has a 9.00 pack-year smoking history. She has never used smokeless tobacco. She reports current alcohol use of about 1.0 standard drink of alcohol per week. She reports that she does not use drugs.    Medications     Previous Medications    ALBUTEROL SULFATE HFA (VENTOLIN HFA) 108 (90 BASE) MCG/ACT INHALER    Inhale 2 puffs into the lungs 4 times daily as  needed for Wheezing    DULOXETINE (CYMBALTA) 60 MG EXTENDED RELEASE CAPSULE    Take 1 capsule by mouth daily    FUROSEMIDE (LASIX) 40 MG TABLET    Take 1 tablet by mouth daily    HYDROXYCHLOROQUINE (PLAQUENIL) 200 MG TABLET    Take 400 mg by mouth daily    NABUMETONE (RELAFEN) 500 MG TABLET    Take 1 tablet by mouth 2 times daily    SACUBITRIL-VALSARTAN (ENTRESTO) 24-26 MG PER TABLET    Take 1 tablet by mouth 2 times daily    SPIRONOLACTONE (ALDACTONE) 25 MG TABLET    Take 1 tablet by mouth daily    VITAMIN D (CHOLECALCIFEROL) 1000 UNIT TABS TABLET    Take 1,000 Units by mouth daily Twice a day        Allergies     She is allergic to sulfa antibiotics.    Physical Exam      INITIAL VITALS: BP: (!) 142/116, Temp: 97.8 ??F (36.6 ??C), Heart Rate: (!) 127, Resp: 16, SpO2: 99 %   Physical Exam  Vitals and nursing note reviewed.   Constitutional:       Appearance: She is well-developed and normal weight.   HENT:      Head: Normocephalic and atraumatic.   Eyes:      Pupils: Pupils are equal, round, and reactive to light.   Cardiovascular:      Rate and Rhythm: Regular rhythm. Tachycardia present.   Pulmonary:      Comments: Mildly increased respiratory effort.  Coarse rhonchi bilaterally.  Chest:      Chest wall: No tenderness.   Abdominal:      Palpations: Abdomen is soft.      Tenderness: There is no abdominal tenderness.   Musculoskeletal:         General: Normal range of motion.      Cervical back: Normal range of motion.      Right lower leg: Edema present.      Left lower leg: Edema present.   Skin:     General: Skin is warm.      Capillary Refill: Capillary refill takes less than 2 seconds.   Neurological:      General: No focal deficit present.      Mental Status: She is alert and oriented to person, place, and time.      Cranial Nerves: No cranial nerve deficit.   Psychiatric:         Mood and Affect: Mood normal.         DiagnosticResults     EKG   Indication: Shortness of breath: Ventricular rate 138, PR interval atrial flutter, QRS 96, QTc 560, axis -34 degrees.  Computer interpretation is a flutter with 2-1 conduction although difficult to interpret there do appear to be P waves this may be more sinus tachycardia compared to atrial flutter.  This is new compared to prior EKG performed in November 2020.    RADIOLOGY:  CT CHEST PULMONARY EMBOLISM W CONTRAST   Final Result      1. No evidence of any pulmonary embolism.   2. Four-chamber cardiac enlargement with reflux of contrast into the hepatic veins. This can be seen with elevated right heart pressures.   3. No acute pulmonary process.      XR CHEST PORTABLE   Final Result      Stable cardiac enlargement      No acute  consolidation  LABS:   Results for orders placed or performed during the hospital encounter of 02/09/21   CBC with Auto Differential   Result Value Ref Range    WBC 8.8 4.0 - 11.0 K/uL    RBC 4.43 4.00 - 5.20 M/uL    Hemoglobin 13.3 12.0 - 16.0 g/dL    Hematocrit 16.140.6 09.636.0 - 48.0 %    MCV 91.7 80.0 - 100.0 fL    MCH 30.1 26.0 - 34.0 pg    MCHC 32.8 31.0 - 36.0 g/dL    RDW 04.514.8 40.912.4 - 81.115.4 %    Platelets 264 135 - 450 K/uL    MPV 7.7 5.0 - 10.5 fL    Neutrophils % 53.6 %    Lymphocytes % 31.3 %    Monocytes % 13.4 %    Eosinophils % 1.0 %    Basophils % 0.7 %    Neutrophils Absolute 4.7 1.7 - 7.7 K/uL    Lymphocytes Absolute 2.8 1.0 - 5.1 K/uL    Monocytes Absolute 1.2 0.0 - 1.3 K/uL    Eosinophils Absolute 0.1 0.0 - 0.6 K/uL    Basophils Absolute 0.1 0.0 - 0.2 K/uL   Basic Metabolic Panel w/ Reflex to MG   Result Value Ref Range    Sodium 131 (L) 136 - 145 mmol/L    Potassium reflex Magnesium 3.7 3.5 - 5.1 mmol/L    Chloride 93 (L) 99 - 110 mmol/L    CO2 23 21 - 32 mmol/L    Anion Gap 15 3 - 16    Glucose 130 (H) 70 - 99 mg/dL    BUN 14 7 - 20 mg/dL    CREATININE 0.8 0.6 - 1.1 mg/dL    GFR Non-African American >60 >60    GFR African American >60 >60    Calcium 9.5 8.3 - 10.6 mg/dL   Troponin   Result Value Ref Range    Troponin <0.01 <0.01 ng/mL   Brain Natriuretic Peptide   Result Value Ref Range    Pro-BNP 4,707 (H) 0 - 124 pg/mL   Urinalysis   Result Value Ref Range    Color, UA Yellow Straw/Yellow    Clarity, UA Clear Clear    Glucose, Ur Negative Negative mg/dL    Bilirubin Urine Negative Negative    Ketones, Urine Negative Negative mg/dL    Specific Gravity, UA 1.015 1.005 - 1.030    Blood, Urine Negative Negative    pH, UA 7.0 5.0 - 8.0    Protein, UA TRACE (A) Negative mg/dL    Urobilinogen, Urine 0.2 <2.0 E.U./dL    Nitrite, Urine Negative Negative    Leukocyte Esterase, Urine TRACE (A) Negative    Microscopic Examination YES     Urine Type Voided    Blood gas, venous (Lab)   Result Value Ref  Range    pH, Ven 7.483 (H) 7.350 - 7.450    pCO2, Ven 38.2 (L) 41.0 - 51.0 mmHg    pO2, Ven <30.0 25.0 - 40.0 mmHg    HCO3, Venous 28.6 (H) 24.0 - 28.0 mmol/L    Base Excess, Ven 4.9 (H) -2.0 - 3.0 mmol/L    O2 Sat, Ven 55 Not established %    Carboxyhemoglobin 1.4 0.0 - 1.5 %    MetHgb, Ven 0.3 0.0 - 1.5 %    TC02 (Calc), Ven 30 mmol/L    Hemoglobin, Ven, Reduced 44.60 %   Microscopic Urinalysis   Result Value Ref Range  WBC, UA 3-5 0 - 5 /HPF    RBC, UA 3-4 0 - 4 /HPF    Epithelial Cells, UA 2-5 0 - 5 /HPF    Amorphous, UA 2+ /HPF   EKG 12 Lead   Result Value Ref Range    Ventricular Rate 138 BPM    Atrial Rate 276 BPM    QRS Duration 96 ms    Q-T Interval 370 ms    QTc Calculation (Bazett) 560 ms    P Axis 111 degrees    R Axis -34 degrees    T Axis 181 degrees    Diagnosis       EKG performed in ER and to be interpreted by ER physician.Confirmed by MD, ER ([id number]), editor GREEN, MICKEY ([id number]) on 02/09/2021 6:44:23 PM       ED BEDSIDE ULTRASOUND:      RECENT VITALS:  BP: (!) 143/131, Temp: 97.8 ??F (36.6 ??C),Heart Rate: (!) 138, Resp: 25, SpO2: 98 %     Procedures         ED Course     Nursing Notes, Past Medical Hx, Past Surgical Hx, Social Hx, Allergies, and Family Hx werereviewed.    The patient was given thefollowing medications:  Orders Placed This Encounter   Medications   ??? iopamidol (ISOVUE-370) 76 % injection 80 mL   ??? furosemide (LASIX) injection 60 mg       CONSULTS:  IP CONSULT TO HOSPITALIST  IP CONSULT TO HEART FAILURE NURSE/COORDINATOR  IP CONSULT TO DIETITIAN  IP CONSULT TO CARDIOLOGY    MEDICAL DECISION MAKING / ASSESSMENT / PLAN     Molly [name] is a 60 y.o. female who presents with shortness of breath.  Patient notes a 1 month history of progressive shortness of breath.  Recently diagnosed with CHF with an EF of less than 20%.  Here, she is tachycardic and EKG interpretation is a flutter however this appears to be more sinus tachycardia with PACs.  BNP is elevated.  Troponin is not significantly  elevated.  Chest x-ray and CTPA were performed showing hepatic venous congestion with concern for elevated right heart pressures.  Clinically she is volume up and I believe she would benefit from diuresis.  She was given 60 mg of Lasix.  She will be admitted for further management..      Clinical Impression     1. Shortness of breath    2. Congestive heart failure, unspecified HF chronicity, unspecified heart failure type (HCC)        Disposition     PATIENT REFERRED TO:  No follow-up provider specified.    DISCHARGE MEDICATIONS:  New Prescriptions    No medications on file       DISPOSITION Admitted 02/09/2021 10:10:23 PM         Rudi Rummage, MD  02/09/21 2241

## 2021-02-09 NOTE — Progress Notes (Addendum)
4 Eyes Admission Assessment     I agree as the admission nurse that 2 RN's have performed a thorough Head to Toe Skin Assessment on the patient. ALL assessment sites listed below have been assessed on admission.       Areas assessed by both nurses:   [x]    Head, Face, and Ears   [x]    Shoulders, Back, and Chest  [x]    Arms, Elbows, and Hands   [x]    Coccyx, Sacrum, and Ischium  [x]    Legs, Feet, and Heels        Does the Patient have Skin Breakdown?  Yes a wound was noted on the Admission Assessment and an LDA was Initiated documentation include the Peri-wound, Wound Assessment, Measurements, Dressing Treatment, Drainage, and Color",     Left great toe bruising, previous blood blister per patient.  Scattered abrasions right back  Redness/Rash/Moisture right groin and abdominal fold    Braden Prevention initiated:  No   Wound Care Orders initiated:  No      WOC nurse consulted for Pressure Injury (Stage 3,4, Unstageable, DTI, NWPT, and Complex wounds) or Braden score 18 or lower:  No      Nurse 1 eSignature: Electronically signed by , RN on 02/10/21 at 1:06 AM EDT    **SHARE this note so that the co-signing nurse is able to place an eSignature**    Nurse 2 eSignature: Electronically signed by , RN on 02/09/21 at 10:59 PM EDT

## 2021-02-09 NOTE — H&P (Signed)
Internal Medicine  PGY 1  History & Physical      CC  Shortness of breath     History Obtained From:  patient    HISTORY OF PRESENT ILLNESS:  Molly [name] is a 60 yo female with a PMH of non- hodgkin's lymphoma, Systemic lupus, rheumatoid arthritis who presented to the ED with chief complaints of cough and shortness of breath for the past 6 weeks that got worse recently in the past few days.   As per the patient about 6 weeks ago she was having shortness of breath while walking around with decrease in her levels of activity along with cough with white to clear sputum. It was also associated with weight gain of 46 pounds and lower extremity edema. Patient saw her PCP who recommended her top get an ECHO done, results of which came back last night showing an EF of 20%. Patient has been on lasix 40 mg daily and was started on enteresto and aldactone. She does state of having mild difficulty in lying flat in bed and has been using around 1 pillow which is her baseline. Patient was feeling worse this morning, with a cardiology appointment not until next week she decided to come to the hospital for further evaluation.     In the ED patient was found to be afebrile with stable vitals, labs were remarkable of Na of 131, normal WBC count, Pro BNP of 4707. Chest Xray with stable cardiac enlargement and CTPE with no evidence of any PE with four chambercardiac enlargement with reflux of contrast into the hepatic veins. Patient was given a dose of lasix 60 mg IV and was admitted to the hospital for treatment of CHF with cardiology consultation.         Past Medical History:        Diagnosis Date   ??? Amenorrhea    ??? Cancer (HCC)    ??? History of non-Hodgkin's lymphoma 2006    treated with radiation and chemo   ??? Obesity    ??? Systemic lupus     followed by Dr. Harley Hallmark   ??     Past Surgical History:        Procedure Laterality Date   ??? JOINT REPLACEMENT     ??? KNEE ARTHROPLASTY Left 10/25/2016    left total knee replacement using  computer navigation [facility])   ??? KNEE SURGERY Left     ACL and meniscus repair   ??? LEEP      in 1990's   ??     Medications Priorto Admission:    ?? Not in a hospital admission.    Allergies:  Sulfa antibiotics    Social History:   ?? TOBACCO:   reports that she quit smoking about 14 years ago. Her smoking use included cigarettes. She has a 9.00 pack-year smoking history. She has never used smokeless tobacco.  ?? ETOH:   reports current alcohol use of about 1.0 standard drink of alcohol per week.  ?? DRUGS : none   ?? Patient currently lives with family at home   ??   Family History:       Problem Relation Age of Onset   ??? Dementia Mother    ??? Thyroid Disease Mother    ??? Other Father         paraplegic after farming accident   ??? High Cholesterol Sister    ??? Cancer Sister 88        Breast  [place]   ??? Thyroid Disease Sister    ??? High Cholesterol Brother    ??? High Cholesterol Brother    ??? Rheum Arthritis Neg Hx    ??? Osteoarthritis Neg Hx    ??? Asthma Neg Hx    ??? Breast Cancer Neg Hx    ??? Diabetes Neg Hx    ??? Heart Failure Neg Hx    ??? Hypertension Neg Hx    ??? Migraines Neg Hx    ??? Ovarian Cancer Neg Hx    ??? Rashes/Skin Problems Neg Hx    ??? Seizures Neg Hx    ??? Stroke Neg Hx    ??     Review of Systems    ROS: A 10 point review of systems was conducted, significant findings as noted in HPI.    Physical Exam  Constitutional:       Appearance: She is obese.   HENT:      Head: Normocephalic and atraumatic.      Right Ear: External ear normal.      Left Ear: External ear normal.      Nose: Nose normal.      Mouth/Throat:      Mouth: Mucous membranes are moist.   Eyes:      Pupils: Pupils are equal, round, and reactive to light.   Cardiovascular:      Rate and Rhythm: Normal rate and regular rhythm.      Pulses: Normal pulses.   Pulmonary:      Effort: Pulmonary effort is normal.      Breath sounds: Rales present.      Comments: B/L crackles   Abdominal:      General: There is no distension.      Palpations: Abdomen is soft.       Tenderness: There is no abdominal tenderness.   Musculoskeletal:      Right lower leg: Edema (3+) present.      Left lower leg: Edema (3+) present.   Skin:     General: Skin is warm.   Neurological:      Mental Status: She is alert and oriented to person, place, and time.   Psychiatric:         Thought Content: Thought content normal.       Physical exam:       Vitals:    02/09/21 2130   BP: (!) 143/131   Pulse: (!) 138   Resp: 25   Temp:    SpO2: 98%       DATA:    Labs:  CBC:   Recent Labs     02/09/21  1840   WBC 8.8   HGB 13.3   HCT 40.6   PLT 264       BMP:   Recent Labs     02/09/21  1840   NA 131*   K 3.7   CL 93*   CO2 23   BUN 14   CREATININE 0.8   GLUCOSE 130*     LFT's: No results for input(s): AST, ALT, ALB, BILITOT, ALKPHOS in the last 72 hours.  Troponin:   Recent Labs     02/09/21  1840   TROPONINI <0.01     BNP:No results for input(s): BNP in the last 72 hours.  ABGs: No results for input(s): PHART, PCO2ART, PO2ART in the last 72 hours.  INR: No results for input(s): INR in the last 72 hours.    U/A:  Recent Labs  02/09/21  1913   COLORU Yellow   PHUR 7.0   WBCUA 3-5   RBCUA 3-4   CLARITYU Clear   SPECGRAV 1.015   LEUKOCYTESUR TRACE*   UROBILINOGEN 0.2   BILIRUBINUR Negative   BLOODU Negative   GLUCOSEU Negative   AMORPHOUS 2+       CT CHEST PULMONARY EMBOLISM W CONTRAST   Final Result      1. No evidence of any pulmonary embolism.   2. Four-chamber cardiac enlargement with reflux of contrast into the hepatic veins. This can be seen with elevated right heart pressures.   3. No acute pulmonary process.      XR CHEST PORTABLE   Final Result      Stable cardiac enlargement      No acute consolidation                    ASSESSMENT AND PLAN:    60 yo female with a PMH of non- hodgkin's lymphoma, Systemic lupus, rheumatoid arthritis who presented to the ED with chief complaints of cough and shortness of breath for the past 6 weeks that got worse recently in the past few days    Acute CHF with reduced EF    Lats ECHO on 6/17 with left left ventricle  severely dilated, EF of <20%, severe diffuse hypokinesis and grade 3 diastolic dysfunction   ProBNP 6270   -s/p 60 mg IV lasix in the ED   -consulted cardiology, appreciate recs   -strict Is and Os   -daily weights   -BNP every third day   -pending lipid panel   -on continuous telemetry   -started on lasix gtt, keeping in mind soft blood pressure   -consulted CHF nurse  -low salt diet   -continue home aldactone, consider starting entresto and SGLT-2 inhibitor   -started on metoprolol 25 mg PO daily      Chronic medical conditions   Systemic lupus Erythematosus (Anti DNA positive with recent titre of 1:40)  Osteoarthritis   -continue home Plaquenil 400 mg Po daily   -continue home cymbalta 60 mg PO daily   -hold Relafen 500 mg for now       Will discuss with attending physician Dr. Rudene Christians, MD.    Code Status:Full code  FEN: No diet orders on file  PPX: lovenox   DISPO: IP    Barbee Cough, MD  02/09/2021,  10:12 PM

## 2021-02-09 NOTE — Progress Notes (Signed)
Pt arrived at Room 4457 via stretcher. A&O x4. HR 128, BP 131/90. +SOB with exertion, O2 99% at room air.Oriented to room. Reviewed POC. Fall contract discussed.

## 2021-02-10 LAB — CBC WITH AUTO DIFFERENTIAL
Basophils %: 0.4 %
Basophils Absolute: 0 10*3/uL (ref 0.0–0.2)
Eosinophils %: 0.9 %
Eosinophils Absolute: 0.1 10*3/uL (ref 0.0–0.6)
Hematocrit: 37.6 % (ref 36.0–48.0)
Hemoglobin: 12.5 g/dL (ref 12.0–16.0)
Lymphocytes %: 27.8 %
Lymphocytes Absolute: 2.2 10*3/uL (ref 1.0–5.1)
MCH: 30.1 pg (ref 26.0–34.0)
MCHC: 33.2 g/dL (ref 31.0–36.0)
MCV: 90.7 fL (ref 80.0–100.0)
MPV: 7.8 fL (ref 5.0–10.5)
Monocytes %: 14.3 %
Monocytes Absolute: 1.1 10*3/uL (ref 0.0–1.3)
Neutrophils %: 56.6 %
Neutrophils Absolute: 4.5 10*3/uL (ref 1.7–7.7)
Platelets: 242 10*3/uL (ref 135–450)
RBC: 4.15 M/uL (ref 4.00–5.20)
RDW: 14.8 % (ref 12.4–15.4)
WBC: 8 10*3/uL (ref 4.0–11.0)

## 2021-02-10 LAB — BASIC METABOLIC PANEL
Anion Gap: 12 (ref 3–16)
BUN: 12 mg/dL (ref 7–20)
CO2: 28 mmol/L (ref 21–32)
Calcium: 8.9 mg/dL (ref 8.3–10.6)
Chloride: 94 mmol/L — ABNORMAL LOW (ref 99–110)
Creatinine: 0.9 mg/dL (ref 0.6–1.1)
GFR African American: >60 (ref >60)
GFR Non-African American: >60 (ref >60)
Glucose: 106 mg/dL — ABNORMAL HIGH (ref 70–99)
Potassium: 3.4 mmol/L — ABNORMAL LOW (ref 3.5–5.1)
Sodium: 134 mmol/L — ABNORMAL LOW (ref 136–145)

## 2021-02-10 LAB — MAGNESIUM: Magnesium: 1.9 mg/dL (ref 1.80–2.40)

## 2021-02-10 LAB — EKG 12-LEAD
Atrial Rate: 277 {beats}/min
Atrial Rate: 278 {beats}/min
Q-T Interval: 368 ms
Q-T Interval: 376 ms
QRS Duration: 102 ms
QRS Duration: 104 ms
QTc Calculation (Bazett): 517 ms
QTc Calculation (Bazett): 524 ms
R Axis: -25 degrees
R Axis: 44 degrees
T Axis: 108 degrees
T Axis: 81 degrees
Ventricular Rate: 117 {beats}/min
Ventricular Rate: 119 {beats}/min

## 2021-02-10 LAB — RENAL FUNCTION PANEL
Albumin: 4.3 g/dL (ref 3.4–5.0)
Anion Gap: 14 (ref 3–16)
BUN: 13 mg/dL (ref 7–20)
CO2: 25 mmol/L (ref 21–32)
Calcium: 9.2 mg/dL (ref 8.3–10.6)
Chloride: 91 mmol/L — ABNORMAL LOW (ref 99–110)
Creatinine: 1 mg/dL (ref 0.6–1.1)
GFR African American: >60 (ref >60)
GFR Non-African American: 57 — AB (ref >60)
Glucose: 113 mg/dL — ABNORMAL HIGH (ref 70–99)
Phosphorus: 3.7 mg/dL (ref 2.5–4.9)
Potassium: 3.5 mmol/L (ref 3.5–5.1)
Sodium: 130 mmol/L — ABNORMAL LOW (ref 136–145)

## 2021-02-10 LAB — CBC
Hematocrit: 39.6 % (ref 36.0–48.0)
Hemoglobin: 13 g/dL (ref 12.0–16.0)
MCH: 29.7 pg (ref 26.0–34.0)
MCHC: 32.8 g/dL (ref 31.0–36.0)
MCV: 90.7 fL (ref 80.0–100.0)
MPV: 7.8 fL (ref 5.0–10.5)
Platelets: 263 10*3/uL (ref 135–450)
RBC: 4.37 M/uL (ref 4.00–5.20)
RDW: 14.8 % (ref 12.4–15.4)
WBC: 8.5 10*3/uL (ref 4.0–11.0)

## 2021-02-10 LAB — APTT: aPTT: 33.1 s (ref 23.0–34.3)

## 2021-02-10 LAB — LIPID PANEL
Cholesterol, Total: 121 mg/dL (ref 0–199)
HDL: 33 mg/dL — ABNORMAL LOW (ref 40–60)
LDL Calculated: 72 mg/dL (ref <100)
Triglycerides: 82 mg/dL (ref 0–150)
VLDL Cholesterol Calculated: 16 mg/dL

## 2021-02-10 LAB — ANTI-XA, UNFRACTIONATED HEPARIN: Anti-XA Unfrac Heparin: 1.08 IU/mL (ref 0.30–0.70)

## 2021-02-10 LAB — TSH WITH REFLEX: TSH: 3.17 u[IU]/mL (ref 0.27–4.20)

## 2021-02-10 LAB — PROTIME-INR
INR: 1.27 — ABNORMAL HIGH (ref 0.87–1.14)
Protime: 15.8 s — ABNORMAL HIGH (ref 11.7–14.5)

## 2021-02-10 MED ORDER — FUROSEMIDE 10 MG/ML IJ SOLN
10 MG/ML | INTRAMUSCULAR | Status: DC
Start: 2021-02-10 — End: 2021-02-13
  Administered 2021-02-10 (×3): 10 mg/h via INTRAVENOUS

## 2021-02-10 MED ORDER — ENOXAPARIN SODIUM 40 MG/0.4ML IJ SOSY
40 MG/0.4ML | Freq: Every day | INTRAMUSCULAR | Status: DC
Start: 2021-02-10 — End: 2021-02-10
  Administered 2021-02-10: 13:00:00 40 mg via SUBCUTANEOUS

## 2021-02-10 MED ORDER — ONDANSETRON 4 MG PO TBDP
4 MG | Freq: Three times a day (TID) | ORAL | Status: DC | PRN
Start: 2021-02-10 — End: 2021-02-24

## 2021-02-10 MED ORDER — POTASSIUM CHLORIDE 10 MEQ/100ML IV SOLN
10100 MEQ/0ML | INTRAVENOUS | Status: AC
Start: 2021-02-10 — End: 2021-02-10
  Administered 2021-02-10: 21:00:00 10 meq via INTRAVENOUS

## 2021-02-10 MED ORDER — HEPARIN SOD (PORCINE) IN D5W 100 UNIT/ML IV SOLN
100 UNIT/ML | INTRAVENOUS | Status: DC
Start: 2021-02-10 — End: 2021-02-11
  Administered 2021-02-10: 16:00:00 14 [IU]/kg/h via INTRAVENOUS
  Administered 2021-02-11: 11:00:00 11 [IU]/kg/h via INTRAVENOUS

## 2021-02-10 MED ORDER — DULOXETINE HCL 60 MG PO CPEP
60 MG | Freq: Every day | ORAL | Status: DC
Start: 2021-02-10 — End: 2021-02-24
  Administered 2021-02-10 – 2021-02-24 (×9): 60 mg via ORAL

## 2021-02-10 MED ORDER — MELATONIN 5 MG PO TBDP
5 MG | Freq: Every evening | ORAL | Status: DC
Start: 2021-02-10 — End: 2021-02-12
  Administered 2021-02-11 – 2021-02-12 (×2): 5 mg via ORAL

## 2021-02-10 MED ORDER — HYDROXYCHLOROQUINE SULFATE 200 MG PO TABS
200 MG | Freq: Every day | ORAL | Status: DC
Start: 2021-02-10 — End: 2021-02-24
  Administered 2021-02-10 – 2021-02-12 (×2): 400 mg via ORAL

## 2021-02-10 MED ORDER — HEPARIN SODIUM (PORCINE) 1000 UNIT/ML IJ SOLN
1000 UNIT/ML | Freq: Once | INTRAMUSCULAR | Status: AC
Start: 2021-02-10 — End: 2021-02-10
  Administered 2021-02-10: 16:00:00 10000 [IU] via INTRAVENOUS

## 2021-02-10 MED ORDER — METOPROLOL SUCCINATE ER 25 MG PO TB24
25 MG | Freq: Every day | ORAL | Status: DC
Start: 2021-02-10 — End: 2021-02-10
  Administered 2021-02-10: 13:00:00 25 mg via ORAL

## 2021-02-10 MED ORDER — SPIRONOLACTONE 25 MG PO TABS
25 MG | Freq: Every day | ORAL | Status: DC
Start: 2021-02-10 — End: 2021-02-10
  Administered 2021-02-10: 13:00:00 25 mg via ORAL

## 2021-02-10 MED ORDER — SPIRONOLACTONE 50 MG PO TABS
50 MG | Freq: Every day | ORAL | Status: DC
Start: 2021-02-10 — End: 2021-02-18
  Administered 2021-02-11 – 2021-02-18 (×4): 50 mg via ORAL

## 2021-02-10 MED ORDER — FUROSEMIDE 10 MG/ML IJ SOLN
10 MG/ML | Freq: Once | INTRAMUSCULAR | Status: AC
Start: 2021-02-10 — End: 2021-02-09
  Administered 2021-02-10: 01:00:00 60 mg via INTRAVENOUS

## 2021-02-10 MED ORDER — NORMAL SALINE FLUSH 0.9 % IV SOLN
0.9 % | Freq: Two times a day (BID) | INTRAVENOUS | Status: DC
Start: 2021-02-10 — End: 2021-02-12
  Administered 2021-02-10 – 2021-02-12 (×5): 10 mL via INTRAVENOUS

## 2021-02-10 MED ORDER — SPIRONOLACTONE 25 MG PO TABS
25 MG | Freq: Once | ORAL | Status: AC
Start: 2021-02-10 — End: 2021-02-10
  Administered 2021-02-10: 16:00:00 25 mg via ORAL

## 2021-02-10 MED ORDER — HYDROXYZINE HCL 10 MG PO TABS
10 MG | Freq: Three times a day (TID) | ORAL | Status: DC | PRN
Start: 2021-02-10 — End: 2021-02-10

## 2021-02-10 MED ORDER — METOPROLOL TARTRATE 25 MG PO TABS
25 MG | Freq: Two times a day (BID) | ORAL | Status: DC
Start: 2021-02-10 — End: 2021-02-18
  Administered 2021-02-11 – 2021-02-18 (×8): 25 mg via ORAL

## 2021-02-10 MED ORDER — POLYETHYLENE GLYCOL 3350 17 G PO PACK
17 g | Freq: Every day | ORAL | Status: DC | PRN
Start: 2021-02-10 — End: 2021-02-24

## 2021-02-10 MED ORDER — ALBUTEROL SULFATE (2.5 MG/3ML) 0.083% IN NEBU
RESPIRATORY_TRACT | Status: DC | PRN
Start: 2021-02-10 — End: 2021-02-24
  Administered 2021-02-16: 01:00:00 2.5 mg via RESPIRATORY_TRACT

## 2021-02-10 MED ORDER — ACETAMINOPHEN 650 MG RE SUPP
650 MG | Freq: Four times a day (QID) | RECTAL | Status: DC | PRN
Start: 2021-02-10 — End: 2021-02-12

## 2021-02-10 MED ORDER — METOPROLOL TARTRATE 5 MG/5ML IV SOLN
5 MG/ML | Freq: Once | INTRAVENOUS | Status: AC
Start: 2021-02-10 — End: 2021-02-10
  Administered 2021-02-10: 06:00:00 2.5 mg via INTRAVENOUS

## 2021-02-10 MED ORDER — POTASSIUM BICARB-CITRIC ACID 20 MEQ PO TBEF
20 MEQ | Freq: Once | ORAL | Status: AC
Start: 2021-02-10 — End: 2021-02-10
  Administered 2021-02-10: 20:00:00 40 meq via ORAL

## 2021-02-10 MED ORDER — ACETAMINOPHEN 325 MG PO TABS
325 MG | Freq: Four times a day (QID) | ORAL | Status: DC | PRN
Start: 2021-02-10 — End: 2021-02-12

## 2021-02-10 MED ORDER — NORMAL SALINE FLUSH 0.9 % IV SOLN
0.9 | INTRAVENOUS | Status: DC | PRN
Start: 2021-02-10 — End: 2021-02-12
  Administered 2021-02-10 – 2021-02-11 (×2): 10 mL via INTRAVENOUS

## 2021-02-10 MED ORDER — HEPARIN SODIUM (PORCINE) 1000 UNIT/ML IJ SOLN
1000 UNIT/ML | INTRAMUSCULAR | Status: DC | PRN
Start: 2021-02-10 — End: 2021-02-12

## 2021-02-10 MED ORDER — POTASSIUM CHLORIDE CRYS ER 20 MEQ PO TBCR
20 MEQ | Freq: Once | ORAL | Status: AC
Start: 2021-02-10 — End: 2021-02-10
  Administered 2021-02-10: 12:00:00 40 meq via ORAL

## 2021-02-10 MED ORDER — SODIUM CHLORIDE 0.9 % IV SOLN
0.9 | INTRAVENOUS | Status: DC | PRN
Start: 2021-02-10 — End: 2021-02-12

## 2021-02-10 MED ORDER — ONDANSETRON HCL 4 MG/2ML IJ SOLN
4 MG/2ML | Freq: Four times a day (QID) | INTRAMUSCULAR | Status: DC | PRN
Start: 2021-02-10 — End: 2021-02-24
  Administered 2021-02-12 – 2021-02-17 (×2): 4 mg via INTRAVENOUS

## 2021-02-10 MED ORDER — POTASSIUM CHLORIDE CRYS ER 20 MEQ PO TBCR
20 MEQ | Freq: Once | ORAL | Status: DC
Start: 2021-02-10 — End: 2021-02-10

## 2021-02-10 MED FILL — ENOXAPARIN SODIUM 40 MG/0.4ML IJ SOSY: 40 MG/0.4ML | INTRAMUSCULAR | Qty: 0.4

## 2021-02-10 MED FILL — HYDROXYCHLOROQUINE SULFATE 200 MG PO TABS: 200 mg | ORAL | Qty: 2

## 2021-02-10 MED FILL — FUROSEMIDE 10 MG/ML IJ SOLN: 10 mg/mL | INTRAMUSCULAR | Qty: 8

## 2021-02-10 MED FILL — FUROSEMIDE 10 MG/ML IJ SOLN: 10 mg/mL | INTRAMUSCULAR | Qty: 10

## 2021-02-10 MED FILL — EFFER-K 20 MEQ PO TBEF: 20 meq | ORAL | Qty: 2

## 2021-02-10 MED FILL — HEPARIN SOD (PORCINE) IN D5W 100 UNIT/ML IV SOLN: 100 [IU]/mL | INTRAVENOUS | Qty: 250

## 2021-02-10 MED FILL — HYDROXYZINE HCL 10 MG PO TABS: 10 mg | ORAL | Qty: 1

## 2021-02-10 MED FILL — POTASSIUM CHLORIDE 10 MEQ/100ML IV SOLN: 10 MEQ/0ML | INTRAVENOUS | Qty: 100

## 2021-02-10 MED FILL — SPIRONOLACTONE 25 MG PO TABS: 25 mg | ORAL | Qty: 1

## 2021-02-10 MED FILL — HEPARIN SODIUM (PORCINE) 1000 UNIT/ML IJ SOLN: 1000 [IU]/mL | INTRAMUSCULAR | Qty: 10

## 2021-02-10 MED FILL — DULOXETINE HCL 60 MG PO CPEP: 60 mg | ORAL | Qty: 1

## 2021-02-10 MED FILL — METOPROLOL SUCCINATE ER 25 MG PO TB24: 25 mg | ORAL | Qty: 1

## 2021-02-10 MED FILL — POTASSIUM CHLORIDE CRYS ER 20 MEQ PO TBCR: 20 meq | ORAL | Qty: 2

## 2021-02-10 MED FILL — METOPROLOL TARTRATE 5 MG/5ML IV SOLN: 5 mg/mL | INTRAVENOUS | Qty: 5

## 2021-02-10 NOTE — Significant Event (Signed)
Significant event called at 2210, difficulty breathing while on the commode, was turning blue around the lips and rapid was called. Placed on nrb and did better after this. Was cold and clammy at this time. BP was in the 120/87 and had a HR in the mid 80. satting 100% on nrb. Doing significantly better after a few minutes. It appears that she had a vagal event. Did obtain stat EKG at this time and will continue to closely monitor the patient.

## 2021-02-10 NOTE — Progress Notes (Signed)
Patient's antiXa came back critically high at 1.08, patients heparin gtt on hold for 60 minutes per protocol and will decrease by 3 u/kg/hr when restarted.

## 2021-02-10 NOTE — Progress Notes (Signed)
Pt couldn't tolerate K ryder @100mL /hr, turned down to 74mL/hr and tolerating better. Will retime 2nd dose.

## 2021-02-10 NOTE — Progress Notes (Signed)
RRT called patient in bathroom - diaphoretic, dry heaving and SOB, felt as if she couldn't catch her breath.  Patient placed on NRB at 15L, oxygen saturation improved. STAT EKG ordered    BP 126/90, HR 121, 100% O2 on NRB

## 2021-02-10 NOTE — Progress Notes (Signed)
Updated family member, Theodosia Quay on pt's status. Pt aware. All questions answered,

## 2021-02-10 NOTE — Progress Notes (Signed)
Pt still tachycardic 110s-120s. Cardiac rhythm on monitor difficult to interpret. Facilitated EKG 12L on her which interpreted as A. Flutter with variable AV block non-specific t wave abnormality.     Messaged Dr. Patrick Jupiter, DO on call resident via Perfect serve to make aware. EKG reviewed by Dr. Richarda Overlie.      New order placed by Dr. Sharee Holster for pt. Metoprolol 2.5mg  IV x 1.

## 2021-02-10 NOTE — Consults (Addendum)
Cardiology Consultation                                                                    Pt Name: Molly [name]  Age: 60 y.o.  Sex: female  DOB: [birth year]  Location: 4457/4457-01    Referring Physician: Carlis Stable, MD  Primary cardiologist: Dr Harrison Mons      Reason for Consult:     Reason for Consultation/Chief Complaint: AHF, PAFRVR    HPI:      Molly [name] is a 60 y.o. female with a past medical history of morbid obesity (BMI 43), non-Hodgkin's lymphoma status postchemotherapy 2006- 2007, SLE, RA.  Patient denies any previous cardiac history.  Patient reports she had MUGA test prior to chemotherapy and during treatment, last MUGA test was 2 years after her last chemotherapy and cardiac function was reportedly normal.    Patient presented to the emergency room on 6/21 with progressively worse shortness of breath, lower extremity edema, weight gain, fatigue over the last 6 weeks.  She admits to occasional fluttering in her chest which is very mild.  At the emergency room she was afebrile, tachycardic in AF RVR/atrial flutter RVR, hypertensive, did not require any supplemental oxygen.  Electrolytes normal, proBNP 4700, troponin x1 negative, CBC normal, TSH normal, chest x-ray unremarkable, CTA chest negative for PE, significant biventricular dilatation consistent with cardiomyopathy.    ECG 02/09/2021: Atrial flutter/coarse A. fib with RVR 138 bpm.    Patient was admitted for acute heart failure and was placed on Lasix drip 10 mg an hour after receiving Lasix 60 mg IV x1.  Today I's and O's -3 L, weight down 2 pounds, creatinine stable 1.9, K3.4, magnesium 1.9.  Telemetry was personally reviewed, reveals A. fib with RVR 110-120 bpm.    Echo 02/05/2021: LVD 6.8 cm, LVEF less than 20%, diastolic grade 3, increased LVEDP, severe RV dilatation and dysfunction, moderate MR/TR, RVSP 40 (15).  Images were personally reviewed.          Histories     Past Medical History:   has a past medical history of Amenorrhea, Cancer  (HCC), History of non-Hodgkin's lymphoma, Obesity, and Systemic lupus.    Surgical History:   has a past surgical history that includes LEEP; knee surgery (Left); Knee Arthroplasty (Left, 10/25/2016); and joint replacement.     Social History:   reports that she quit smoking about 14 years ago. Her smoking use included cigarettes. She has a 9.00 pack-year smoking history. She has never used smokeless tobacco. She reports current alcohol use of about 1.0 standard drink of alcohol per week. She reports that she does not use drugs.     Family History:  No evidence for sudden cardiac death or premature CAD      Medications:       Home Medications  Were reviewed and are listed in nursing record. and/or listed below  Prior to Admission medications    Medication Sig Start Date End Date Taking? Authorizing Provider   Multiple Vitamins-Minerals (EMERGEN-C IMMUNE PLUS PO) Take 1 packet by mouth daily   Yes Historical Provider, MD   sacubitril-valsartan (ENTRESTO) 24-26 MG per tablet Take 1 tablet by mouth 2 times daily  Patient not taking: Reported on 02/09/2021  02/08/21   Lindell SparLauren N Gettelfinger, APRN - CNP   furosemide (LASIX) 40 MG tablet Take 1 tablet by mouth daily 02/01/21   Lindell SparLauren N Gettelfinger, APRN - CNP   spironolactone (ALDACTONE) 25 MG tablet Take 1 tablet by mouth daily 02/01/21   Lindell SparLauren N Gettelfinger, APRN - CNP   albuterol sulfate HFA (VENTOLIN HFA) 108 (90 Base) MCG/ACT inhaler Inhale 2 puffs into the lungs 4 times daily as needed for Wheezing 12/30/20   Lindell SparLauren N Gettelfinger, APRN - CNP   hydroxychloroquine (PLAQUENIL) 200 MG tablet Take 400 mg by mouth daily 07/25/17   Historical Provider, MD   DULoxetine (CYMBALTA) 60 MG extended release capsule Take 1 capsule by mouth daily 04/18/18 02/01/21  Harley HallmarkSoha Mousa, MD   nabumetone (RELAFEN) 500 MG tablet Take 1 tablet by mouth 2 times daily 02/19/18 02/01/21  Harley HallmarkSoha Mousa, MD   vitamin D (CHOLECALCIFEROL) 1000 UNIT TABS tablet Take 1,000 Units by mouth daily Twice a day   Patient  not taking: Reported on 02/09/2021    Historical Provider, MD          Inpatient Medications:  ??? potassium chloride  40 mEq Oral Once   ??? [START ON 02/11/2021] spironolactone  50 mg Oral Daily   ??? metoprolol tartrate  25 mg Oral BID   ??? DULoxetine  60 mg Oral Daily   ??? hydroxychloroquine  400 mg Oral Daily   ??? sodium chloride flush  5-40 mL IntraVENous 2 times per day       IV drips:  ??? heparin (PORCINE) Infusion 14 Units/kg/hr (02/10/21 1145)   ??? sodium chloride     ??? furosemide 10 mg/hr (02/10/21 0712)       PRN:  heparin (porcine), heparin (porcine), albuterol, sodium chloride flush, sodium chloride, ondansetron **OR** ondansetron, polyethylene glycol, acetaminophen **OR** acetaminophen    Allergy:     Sulfa antibiotics       Review of Systems:     All 12 point review of symptoms completed. Pertinent positives identified in the HPI, all other review of symptoms negative as below.    CONSTITUTIONAL: No fatigue  SKIN: No rash or pruritis.  EYES: No visual changes or diplopia. No scleral icterus.  ENT: No Headaches, hearing loss or vertigo. No mouth sores or sore throat.  CARDIOVASCULAR: No chest pain/chest pressure/chest discomfort. + palpitations. + edema.   RESPIRATORY: + dyspnea. + cough or wheezing, no sputum production.  GASTROINTESTINAL: No N/V/D. No abdominal pain, appetite loss, blood in stools.  GENITOURINARY: No dysuria, trouble voiding, or hematuria.  MUSCULOSKELETAL:  No gait disturbance, weakness or joint complaints.  NEUROLOGICAL: No headache, diplopia, change in muscle strength, numbness or tingling. No change in gait, balance, coordination, mood, affect, memory, mentation, behavior.  ENDOCRINE: No excessive thirst, fluid intake, or urination. No tremor.  HEMATOLOGIC: No abnormal bruising or bleeding.  ALLERGY: No nasal congestion or hives.      Physical Examination:     Vitals:    02/10/21 0605 02/10/21 0753 02/10/21 1020 02/10/21 1132   BP:  117/86  130/69   Pulse: (!) 114 (!) 117 (!) 121 (!) 120    Resp:  18  14   Temp:  98.5 ??F (36.9 ??C)  98.2 ??F (36.8 ??C)   TempSrc:  Oral  Oral   SpO2:  97%  100%   Weight:       Height:           Wt Readings from Last 3 Encounters:   02/10/21 Marland Kitchen(!)  311 lb 4.6 oz (141.2 kg)   02/01/21 (!) 336 lb (152.4 kg)   01/29/21 (!) 331 lb (150.1 kg)         General Appearance:  Alert, cooperative, no distress, appears stated age Appropriate weight   Head:  Normocephalic, without obvious abnormality, atraumatic   Eyes:  PERRL, conjunctiva/corneas clear EOM intact  Ears normal   Throat no lesions       Nose: Nares normal, no drainage or sinus tenderness   Throat: Lips, mucosa, and tongue normal   Neck: Supple, symmetrical, trachea midline, no adenopathy, thyroid: not enlarged, symmetric, no tenderness/mass/nodules, no carotid bruit.        Lungs:   Respirations unlabored, basilar ronchi and wheezing.    Chest Wall:  No tenderness or deformity   Heart:  Irregular rhythm, rate is tachy, S1, S2 normal, there is no murmur, there is no rub or gallop, +  jvd, 1-2+ bilateral lower extremity edema   Abdomen:   Soft, non-tender, bowel sounds active all four quadrants,  no masses, no organomegaly       Extremities: Extremities normal, atraumatic, no cyanosis.    Pulses: 2+ and symmetric   Skin: Skin color, texture, turgor normal, no rashes or lesions   Pysch: Normal mood and affect   Neurologic: Normal gross motor and sensory exam.  Cranial nerves intact        Labs:     Recent Labs     02/09/21  1840 02/10/21  0522   NA 131* 134*   K 3.7 3.4*   BUN 14 12   CREATININE 0.8 0.9   CL 93* 94*   CO2 23 28   GLUCOSE 130* 106*   CALCIUM 9.5 8.9   MG  --  1.90     Recent Labs     02/09/21  1840 02/09/21  1840 02/10/21  0522 02/10/21  0522 02/10/21  1037   WBC 8.8  --  8.0  --  8.5   HGB 13.3  --  12.5  --  13.0   HCT 40.6  --  37.6  --  39.6   PLT 264  --  242  --  263   MCV 91.7   < > 90.7   < > 90.7    < > = values in this interval not displayed.     No results for input(s): CHOLTOT, TRIG, HDL, CHOLHDL,  LDL in the last 72 hours.    Invalid input(s): LIPIDCOMM, VLDCHOL  Recent Labs     02/10/21  1037   INR 1.27*     Recent Labs     02/09/21  1840   TROPONINI <0.01     No results for input(s): BNP in the last 72 hours.  No results for input(s): TSH in the last 72 hours.  No results for input(s): CHOL, HDL, LDLCALC, TRIG in the last 72 hours.]    Lab Results   Component Value Date    CKTOTAL 106 11/29/2013    TROPONINI <0.01 02/09/2021         Imaging:         Assessment / Plan:     1.  Acute and new HFrEF.  It is of unknown etiology, could be ischemic (severe CAD) versus nonischemic (PAF RVR, less likely postchemotherapy, viral).  Patient is volume overloaded but hemodynamically stable.  2.  Non-Hodgkin's lymphoma status post chemotherapy, now in remission  3.  SLE  4.  Rheumatoid arthritis  5.  PAF RVR.  Patient appears to be in atrial flutter/coarse atrial fibrillation with RVR.  It is of unknown duration and she has not been on any anticoagulation.  She was minimally symptomatic (minimal palpitations).        - Agree with beta-blockers, will change Toprol to Lopressor 25 mg twice daily and uptitrate as BP allows  - Would like to give digoxin 500 mcg IV x1 today if potassium and magnesium corrected  - We will start heparin drip for stroke prevention  - Continue with Lasix drip at 10 mg/h and will increase spironolactone to 50 mg daily  - Strict I's and O's every shift and standing weights if possible, low-salt diet, daily BMP with reflex to Mg (correct lytes for goals K >4.0 and Mg > 2.0) and wean supplemental oxygen to off (or down to baseline supplemental oxygen requirements) for sats greater than 92-94%.  - We could consider TEE with DC cardioversion once patient approaches euvolemia if she remains in afib.   - We will consider LHC once patient euvolemic to rule out significant CAD.                  I have personally reviewed the reports and images of labs, radiological studies, cardiac studies including ECG's  and telemetry, current and old medical records. The note was completed using EMR and Dragon dictation system. Every effort was made to ensure accuracy; however, inadvertent computerized transcription errors may be present.    All questions and concerns were addressed to the patient/family. Alternatives to my treatment were discussed.     I would like to thank you for providing me the opportunity to participate in the care of your patient. If you have any questions, please do not hesitate to contact me.     Laurette Schimke, MD, [facility], FHFSA  [facility]  [address]  [address] [place] [address]  Ph: [phone]  Fax: [phone]

## 2021-02-10 NOTE — Other (Signed)
RT Nebulizer Bronchodilator Protocol Note    There is a bronchodilator order in the chart from a provider indicating to follow the RT Bronchodilator Protocol and there is an ???Initiate RT Bronchodilator Protocol??? order as well (see protocol at bottom of note).    CXR Findings:  XR CHEST PORTABLE    Result Date: 02/09/2021  Stable cardiac enlargement No acute consolidation       The findings from the last RT Protocol Assessment were as follows:  Smoking: None or smoker <15 pack years  Respiratory Pattern: Regular pattern and RR 12-20 bpm  Breath Sounds: Slightly diminished and/or crackles  Cough: Strong, spontaneous, non-productive  Indication for Bronchodilator Therapy: On home bronchodilators  Bronchodilator Assessment Score: 2    Aerosolized bronchodilator medication orders have been revised according to the RT Nebulizer Bronchodilator Protocol below.    Respiratory Therapist to perform RT Therapy Protocol Assessment initially then follow the protocol.  Repeat RT Therapy Protocol Assessment PRN for score 0-3 or on second treatment, BID, and PRN for scores above 3.    No Indications - adjust the frequency to every 6 hours PRN wheezing or bronchospasm, if no treatments needed after 48 hours then discontinue using Per Protocol order mode.     If indication present, adjust the RT bronchodilator orders based on the Bronchodilator Assessment Score as indicated below.  If a patient is on this medication at home then do not decrease Frequency below that used at home.    0-3 - enter or revise RT bronchodilator order(s) to equivalent RT Bronchodilator order with Frequency of every 4 hours PRN for wheezing or increased work of breathing using Per Protocol order mode.       4-6 - enter or revise RT Bronchodilator order(s) to two equivalent RT bronchodilator orders with one order with BID Frequency and one order with Frequency of every 4 hours PRN wheezing or increased work of breathing using Per Protocol order mode.          7-10 - enter or revise RT Bronchodilator order(s) to two equivalent RT bronchodilator orders with one order with TID Frequency and one order with Frequency of every 4 hours PRN wheezing or increased work of breathing using Per Protocol order mode.       11-13 - enter or revise RT Bronchodilator order(s) to one equivalent RT bronchodilator order with QID Frequency and an Albuterol order with Frequency of every 4 hours PRN wheezing or increased work of breathing using Per Protocol order mode.      Greater than 13 - enter or revise RT Bronchodilator order(s) to one equivalent RT bronchodilator order with every 4 hours Frequency and an Albuterol order with Frequency of every 2 hours PRN wheezing or increased work of breathing using Per Protocol order mode.     RT to enter RT Home Evaluation for COPD & MDI Assessment order using Per Protocol order mode.    Electronically signed by Lorenso Quarry, RCP on 02/10/2021 at 6:04 AM

## 2021-02-10 NOTE — ACP (Advance Care Planning) (Signed)
Advance Care Planning     Advance Care Planning Inpatient Note  Spiritual Care Department    Today's Date: 02/10/2021  Unit: [facility] 4 PCU    Received request from patient.  Upon review of chart and communication with care team, patient's decision making abilities are not in question.. Patient was/were present in the room during visit.    Goals of ACP Conversation:  Discuss advance care planning documents    Health Care Decision Makers:       Primary Decision Maker: Molly [name],Molly [name] - Spouse [phone]    Summary:  Documented Next of Kin, per patient report  Pt's husband is NOK.  She wants him to be her HCPOA.  Declined to completepaperwork.    Advance Care Planning Documents (Patient Wishes):  None     Assessment:  Pt was feeling sad and overwhelmed today.  Her husband is a physician (psychiatry) and her sister is a Engineer, civil (consulting).  She finds them to be supportive of her and helpful as health care advocates.      Interventions:  Provided education on documents for clarity and greater understanding  Discussed and provided education on state decision maker hierarchy  Deferred conversation as patient not interested in completing an advance directive at this time    Care Preferences Communicated:   No    Outcomes/Plan:  ACP Discussion: Completed    Electronically signed by Wardell Heath, [name] on 02/10/2021 at 3:28 PM

## 2021-02-10 NOTE — Progress Notes (Signed)
Progress Note    Admit Date: 02/09/2021  Day: 2  Diet: ADULT DIET; Regular; Low Sodium (2 gm)    CC: SOB.    Interval history: Patient appears anxious. She endorses she continues to feel SOB, not requiring any oxygen at this time. She states she has had progressive SOB for past 6 weeks with cough and sputum production. She initially thought it was allergies. She states she has also had SOB with exertion and typical activities she used to do easily are more difficult now. She denies recent fever/chills, n/v, abd pain, acute urinary or bowel abnormalities. She states she has had sinus symptoms, runny nose and congestion over the same time period as well.     Medications:     Scheduled Meds:  ??? potassium chloride  40 mEq Oral Once   ??? DULoxetine  60 mg Oral Daily   ??? hydroxychloroquine  400 mg Oral Daily   ??? spironolactone  25 mg Oral Daily   ??? sodium chloride flush  5-40 mL IntraVENous 2 times per day   ??? enoxaparin  40 mg SubCUTAneous Daily   ??? metoprolol succinate  25 mg Oral Daily     Continuous Infusions:  ??? sodium chloride     ??? furosemide 10 mg/hr (02/10/21 0712)     PRN Meds:albuterol, sodium chloride flush, sodium chloride, ondansetron **OR** ondansetron, polyethylene glycol, acetaminophen **OR** acetaminophen    Objective:   Vitals:   T-max:  Patient Vitals for the past 8 hrs:   BP Temp Temp src Pulse Resp SpO2 Weight   02/10/21 0753 117/86 98.5 ??F (36.9 ??C) Oral (!) 117 18 97 % --   02/10/21 0605 -- -- -- (!) 114 -- -- --   02/10/21 0514 101/80 -- -- (!) 108 -- -- (!) 311 lb 4.6 oz (141.2 kg)   02/10/21 0341 108/82 -- -- -- -- -- --   02/10/21 0315 (!) 157/128 97.9 ??F (36.6 ??C) Oral (!) 120 16 -- --   02/10/21 0212 101/71 -- -- (!) 118 -- -- --       Intake/Output Summary (Last 24 hours) at 02/10/2021 0937  Last data filed at 02/10/2021 6384  Gross per 24 hour   Intake 277.33 ml   Output 3100 ml   Net -2822.67 ml       Review of Systems  Per Interval hx.    Physical Exam  Constitutional:       Comments: Anxious  appearing.   HENT:      Mouth/Throat:      Mouth: Mucous membranes are moist.      Pharynx: Oropharynx is clear.   Eyes:      Extraocular Movements: Extraocular movements intact.      Conjunctiva/sclera: Conjunctivae normal.   Cardiovascular:      Rate and Rhythm: Regular rhythm. Tachycardia present.      Pulses: Normal pulses.      Heart sounds: Normal heart sounds.   Pulmonary:      Effort: Pulmonary effort is normal.      Breath sounds: Rales present.      Comments: Crackles at b/l bases.  Abdominal:      General: There is distension.   Musculoskeletal:      Right lower leg: Edema present.      Left lower leg: Edema present.      Comments: 2+ edema b/l LE.   Skin:     Capillary Refill: Capillary refill takes less than 2 seconds.   Neurological:  General: No focal deficit present.      Mental Status: She is alert and oriented to person, place, and time. Mental status is at baseline.      Comments: No tremors appreciated.        LABS:    CBC:   Recent Labs     02/09/21  1840 02/10/21  0522   WBC 8.8 8.0   HGB 13.3 12.5   HCT 40.6 37.6   PLT 264 242   MCV 91.7 90.7     Renal:    Recent Labs     02/09/21  1840 02/10/21  0522   NA 131* 134*   K 3.7 3.4*   CL 93* 94*   CO2 23 28   BUN 14 12   CREATININE 0.8 0.9   GLUCOSE 130* 106*   CALCIUM 9.5 8.9   MG  --  1.90   ANIONGAP 15 12     Hepatic: No results for input(s): AST, ALT, BILITOT, BILIDIR, PROT, LABALBU, ALKPHOS in the last 72 hours.  Troponin:   Recent Labs     02/09/21  1840   TROPONINI <0.01     BNP: No results for input(s): BNP in the last 72 hours.  Lipids: No results for input(s): CHOL, HDL in the last 72 hours.    Invalid input(s): LDLCALCU, TRIGLYCERIDE  ABGs:  No results for input(s): PHART, PCO2ART, PO2ART, HCO3ART, BEART, THGBART, O2SATART, TCO2ART in the last 72 hours.    INR: No results for input(s): INR in the last 72 hours.  Lactate: No results for input(s): LACTATE in the last 72  hours.  Cultures:  -----------------------------------------------------------------  RAD:   CT CHEST PULMONARY EMBOLISM W CONTRAST   Final Result      1. No evidence of any pulmonary embolism.   2. Four-chamber cardiac enlargement with reflux of contrast into the hepatic veins. This can be seen with elevated right heart pressures.   3. No acute pulmonary process.      XR CHEST PORTABLE   Final Result      Stable cardiac enlargement      No acute consolidation                Assessment/Plan:     59 yo female with a PMH of NHL, SLE who presented to the ED with chief complaints of cough and shortness of breath for the past 6 weeks that got worse recently in the past few days  ??  Acute Exacerbation of New-Onset HFrEF   Recent ECHO on 6/17 with severe dilation of LV, EF of <20%, severe diffuse hypokinesis and grade 3 diastolic dysfunction. Also mildly elevated PAP.  ProBNP 4707, down from ~7k 01/29/21. S/p 60 mg IV Lasix in ED.   >>>Unclear etiology, differentials are broad at this point. Definitely need to rule out ischemia with stress testing/cath when she is more optimized. Consider dilated cardiomyopathy 2/2 delayed Anthracycline toxicity though this would typically present at least with 2-3 years of chemo tx. Also consider viral cardiomyopathy considering recent URI symptoms she reports.   - Lasix gtt  - continue Toprol XL ER 25 mg PO daily  - continue home Aldactone, consider starting Entresto and SGLT-2 inhibitor   - Cards c/s - appreciate reccs     New-Onset Atrial Flutter  Patient endorses SOB however no chest pain or palpitations. She states she feels anxious. Tachycardic on exam.   Arrived to ED in Atrial Flutter based on EKG obtained at admission. Given  Metoprolol 2.5 mg once.   - continue Toprol XL ER 25 mg PO daily  - f/u repeat EKG  ??  Chronic Medical Conditions:  SLE - continue Plaquenil 400 mg PO daily, Cymbalta 60 mg PO daily   OA - holding Relafen       Code Status: FULL CODE  FEN: Adult Diet; Regular    PPX: Lovenox  DISPO: GMF     Margarita Sermons, MD, PGY-1  02/10/21  9:37 AM    This patient has been staffed and discussed with Carlis Stable, MD.

## 2021-02-10 NOTE — Plan of Care (Signed)
Problem: Discharge Planning  Goal: Discharge to home or other facility with appropriate resources  Outcome: Progressing  Flowsheets  Taken 02/10/2021 0434  Discharge to home or other facility with appropriate resources: Identify barriers to discharge with patient and caregiver    Problem: Chronic Conditions and Co-morbidities  Goal: Patient's chronic conditions and co-morbidity symptoms are monitored and maintained or improved  Outcome: Progressing  Flowsheets (Taken 02/10/2021 0434)  Care Plan - Patient's Chronic Conditions and Co-Morbidity Symptoms are Monitored and Maintained or Improved:   Monitor and assess patient's chronic conditions and comorbid symptoms for stability, deterioration, or improvement   Collaborate with multidisciplinary team to address chronic and comorbid conditions and prevent exacerbation or deterioration     Problem: Respiratory - Adult  Goal: Achieves optimal ventilation and oxygenation  Outcome: Progressing  Flowsheets (Taken 02/10/2021 0434)  Achieves optimal ventilation and oxygenation:   Assess for changes in respiratory status   Assess for changes in mentation and behavior   Position to facilitate oxygenation and minimize respiratory effort   Oxygen supplementation based on oxygen saturation or arterial blood gases   Encourage broncho-pulmonary hygiene including cough, deep breathe, incentive spirometry   Assess the need for suctioning and aspirate as needed   Assess and instruct to report shortness of breath or any respiratory difficulty   Respiratory therapy support as indicated     Problem: Cardiovascular - Adult  Goal: Maintains optimal cardiac output and hemodynamic stability  Outcome: Progressing  Flowsheets (Taken 02/10/2021 0434)  Maintains optimal cardiac output and hemodynamic stability:   Monitor blood pressure and heart rate   Monitor urine output and notify Licensed Independent Practitioner for values outside of normal range   Assess for signs of decreased cardiac output    Administer vasoactive medications as ordered     Problem: Cardiovascular - Adult  Goal: Absence of cardiac dysrhythmias or at baseline  Outcome: Progressing  Flowsheets (Taken 02/10/2021 0434)  Absence of cardiac dysrhythmias or at baseline:   Monitor cardiac rate and rhythm   Assess for signs of decreased cardiac output                Administer antiarrhythmia medication as ordered,    Problem: ABCDS Injury Assessment  Goal: Absence of physical injury  Outcome: Progressing  Flowsheets (Taken 02/10/2021 0434)  Absence of Physical Injury: Implement safety measures based on patient assessment

## 2021-02-10 NOTE — Care Coordination-Inpatient (Signed)
Case Management Assessment           Initial Evaluation                Date / Time of Evaluation: 02/10/2021 10:41 AM                 Assessment Completed by: Marjie Skiff, RN    Patient Name: Molly [name]     Date of Birth: [birth year]  Diagnosis: Shortness of breath [R06.02]  CHF (congestive heart failure), NYHA class I, acute on chronic, combined (HCC) [I50.43]  Congestive heart failure, unspecified HF chronicity, unspecified heart failure type [name]) [I50.9]     Date / Time: 02/09/2021  6:11 PM    Patient Admission Status: Inpatient    If patient is discharged prior to next notation, then this note serves as note for discharge by case management.     Current PCP: Lindell Spar, APRN - CNP  Clinic Patient: No    Chart Reviewed: Yes  Patient/ Family Interviewed: Yes    Initial assessment completed at bedside with: patient     Hospitalization in the last 30 days: No    Emergency Contacts:  Extended Emergency Contact Information  Primary Emergency Contact: Dunsieth,Neal  Address: [address]           [place], [place] [address] [place]  Home Phone: [phone]  Mobile Phone: [phone]  Relation: Spouse  Secondary Emergency Contact: Theodosia Quay  Home Phone: [phone]  Relation: Other Relative    Advance Directives:   Code Status: Full Code        Financial  Payor: Edgar Frisk / Plan: Rica Koyanagi ACA / Product Type: *No Product type* /     Pre-cert required for SNF: Yes    Pharmacy    [facility] - [place], [place] - [address] - [place] [phone] - F [phone]  [address]  [place] [address]  Phone: [phone] Fax: [phone]    [facility] [id number] - [place], [place] - [address] Demetrius Charity [phone] Carmon Ginsberg [phone]  [address]  [place] [address]  Phone: [phone] Fax: [phone]    [facility] - [place], [place] - [address] [place] - [place] [phone] - F [phone]  [address] [place]  [address]  Phone: [phone] Fax: [phone]    [id number] - [place], [place] - [address] - P [phone] - F [phone]  [address]  [place] [address]  Phone: [phone] Fax: [phone]      Potential assistance Purchasing Medications: Potential Assistance Purchasing Medications: No  Does Patient want to participate in local refill/ meds to beds program?: Yes    Meds To Beds General Rules:  1. Can ONLY be done Monday- Friday between 8:30am-5pm  2. Prescription(s) must be in pharmacy by 3pm to be filled same day  3.Copy of patient's insurance/ prescription drug card and patient face sheet must be sent along with the prescription(s)  4. Cost of Rx cannot be added to hospital bill. If financial assistance is needed, please contact unit case manager or Child psychotherapist; Case manager or Social Worker CANNOT provide pharmacy voucher for patients co-pays  5. Patients can then pick up the prescription on their way out of the hospital at discharge, or pharmacy can deliver to the bedside if staff is available. (payment due at time of pick-up or delivery - cash, check, or card  accepted)     Able to afford home medications/ co-pay costs: Yes    ADLS  Support Systems: Spouse/Significant Other    PT AM-PAC:   /24  OT AM-PAC:   /24    HOUSING  Home Environment: 1 level  Steps: 1    Plans to RETURN to current housing: Yes  Barriers to RETURNING to current housing: none    Home Care Information  Currently ACTIVE with Home Health Care: No  Home Care Agency: Not Applicable        Durable Medical Equipment  DME Provider: n/a  Equipment: n/a    Home Oxygen and Respiratory Equipment  Has HOME OXYGEN prior to admission: No  Oxygen Company: Not Applicable      Dialysis  Active with HD/PD prior to admission: No  Nephrologist:     HD Center:  Not Applicable    DISCHARGE PLAN:  Disposition: Home- No Services Soil scientist for discharge: family     Factors facilitating achievement of predicted outcomes:  Family support, Cooperative and Pleasant    Barriers to discharge: Medical complications    Additional Case Management Notes:   Patient is from home with spouse, independent pta, no DME needs, drives self.  Patient's spouse to transport home.    The Plan for Transition of Care is related to the following treatment goals of Shortness of breath [R06.02]  CHF (congestive heart failure), NYHA class I, acute on chronic, combined (HCC) [I50.43]  Congestive heart failure, unspecified HF chronicity, unspecified heart failure type (HCC) [I50.9]    The Patient and/or patient representative Molly [name] and her family were provided with a choice of provider and agrees with the discharge plan Yes    Freedom of choice list was provided with basic dialogue that supports the patient's individualized plan of care/goals and shares the quality data associated with the providers. Yes    Care Transition patient: No    Marjie Skiff, RN  [facility]  Case Management Department  Ph: [phone]   Fax: [phone]

## 2021-02-10 NOTE — Other (Signed)
02/10/21 1531   Encounter Summary   Encounter Overview/Reason  Advance Care Planning   Service Provided For: Patient   Referral/Consult From: Multi-disciplinary team   Support System Spouse   Last Encounter    (es 6/22)   Complexity of Encounter Moderate   Begin Time 1515   End Time  1532   Total Time Calculated 17 min   Encounter    Type Initial Screen/Assessment   Spiritual/Emotional needs   Type Emotional Distress   Assessment/Intervention/Outcome   Assessment Tearful  (physically exhausted due to lack of sleep.  mentally overwhelmed.)   Intervention Active listening;Discussed illness injury and it???s impact;Explored Coping Skills/Resources   Outcome Engaged in conversation;Expressed feelings, needs, and concerns;Receptive;Venting emotion   Plan and Referrals   Plan/Referrals Other (Comment)  (as needed)

## 2021-02-10 NOTE — Telephone Encounter (Signed)
Received DENIAL of Appeal for [employer].  Denial letter attached.    If this requires a response please respond to the pool ( P [facility] MEDICATION PRE-AUTH).      Thank you please advise patient.

## 2021-02-11 LAB — CBC WITH AUTO DIFFERENTIAL
Basophils %: 2.2 %
Basophils Absolute: 0.2 10*3/uL (ref 0.0–0.2)
Eosinophils %: 0 %
Eosinophils Absolute: 0 10*3/uL (ref 0.0–0.6)
Hematocrit: 40.4 % (ref 36.0–48.0)
Hemoglobin: 13.1 g/dL (ref 12.0–16.0)
Lymphocytes %: 18.2 %
Lymphocytes Absolute: 2 10*3/uL (ref 1.0–5.1)
MCH: 29.9 pg (ref 26.0–34.0)
MCHC: 32.4 g/dL (ref 31.0–36.0)
MCV: 92.2 fL (ref 80.0–100.0)
MPV: 8.9 fL (ref 5.0–10.5)
Monocytes %: 11.4 %
Monocytes Absolute: 1.3 10*3/uL (ref 0.0–1.3)
Neutrophils %: 68.2 %
Neutrophils Absolute: 7.5 10*3/uL (ref 1.7–7.7)
Platelets: 224 10*3/uL (ref 135–450)
RBC: 4.38 M/uL (ref 4.00–5.20)
RDW: 15 % (ref 12.4–15.4)
WBC: 11.1 10*3/uL — ABNORMAL HIGH (ref 4.0–11.0)

## 2021-02-11 LAB — BASIC METABOLIC PANEL
Anion Gap: 21 — ABNORMAL HIGH (ref 3–16)
Anion Gap: 15 (ref 3–16)
BUN: 16 mg/dL (ref 7–20)
BUN: 20 mg/dL (ref 7–20)
CO2: 19 mmol/L — ABNORMAL LOW (ref 21–32)
CO2: 27 mmol/L (ref 21–32)
Calcium: 9.3 mg/dL (ref 8.3–10.6)
Calcium: 8.9 mg/dL (ref 8.3–10.6)
Chloride: 89 mmol/L — ABNORMAL LOW (ref 99–110)
Chloride: 90 mmol/L — ABNORMAL LOW (ref 99–110)
Creatinine: 1.2 mg/dL — ABNORMAL HIGH (ref 0.6–1.1)
Creatinine: 1.4 mg/dL — ABNORMAL HIGH (ref 0.6–1.1)
GFR African American: 56 — AB (ref >60)
GFR African American: 47 — AB (ref >60)
GFR Non-African American: 46 — AB (ref >60)
GFR Non-African American: 39 — AB (ref >60)
Glucose: 132 mg/dL — ABNORMAL HIGH (ref 70–99)
Glucose: 107 mg/dL — ABNORMAL HIGH (ref 70–99)
Potassium: 6.6 mmol/L (ref 3.5–5.1)
Potassium: 4.4 mmol/L (ref 3.5–5.1)
Sodium: 129 mmol/L — ABNORMAL LOW (ref 136–145)
Sodium: 132 mmol/L — ABNORMAL LOW (ref 136–145)

## 2021-02-11 LAB — EKG 12-LEAD
Atrial Rate: 236 {beats}/min
P Axis: -82 degrees
Q-T Interval: 332 ms
QRS Duration: 104 ms
QTc Calculation (Bazett): 465 ms
R Axis: -35 degrees
T Axis: 117 degrees
Ventricular Rate: 118 {beats}/min

## 2021-02-11 LAB — POTASSIUM: Potassium: 4.3 mmol/L (ref 3.5–5.1)

## 2021-02-11 LAB — POCT GLUCOSE: POC Glucose: 154 mg/dl — ABNORMAL HIGH (ref 70–99)

## 2021-02-11 LAB — MAGNESIUM
Magnesium: 2.1 mg/dL (ref 1.80–2.40)
Magnesium: 1.9 mg/dL (ref 1.80–2.40)
Magnesium: 1.8 mg/dL (ref 1.80–2.40)

## 2021-02-11 LAB — ANTI-XA, UNFRACTIONATED HEPARIN
Anti-XA Unfrac Heparin: 0.45 IU/mL (ref 0.30–0.70)
Anti-XA Unfrac Heparin: 0.41 IU/mL (ref 0.30–0.70)

## 2021-02-11 LAB — T4, FREE: T4 Free: 1.6 ng/dL (ref 0.9–1.8)

## 2021-02-11 LAB — TROPONIN: Troponin: <0.01 ng/mL (ref <0.01)

## 2021-02-11 MED ORDER — DIGOXIN 0.25 MG/ML IJ SOLN
0.25 MG/ML | Freq: Once | INTRAMUSCULAR | Status: DC
Start: 2021-02-11 — End: 2021-02-11

## 2021-02-11 MED ORDER — APIXABAN 5 MG PO TABS
5 MG | Freq: Two times a day (BID) | ORAL | Status: DC
Start: 2021-02-11 — End: 2021-02-24
  Administered 2021-02-11 – 2021-02-24 (×18): 5 mg via ORAL

## 2021-02-11 MED ORDER — LORAZEPAM 2 MG/ML IJ SOLN
2 MG/ML | Freq: Once | INTRAMUSCULAR | Status: AC
Start: 2021-02-11 — End: 2021-02-10
  Administered 2021-02-11: 04:00:00 0.5 mg via INTRAVENOUS

## 2021-02-11 MED ORDER — HYDROXYZINE HCL 10 MG PO TABS
10 MG | Freq: Three times a day (TID) | ORAL | Status: DC | PRN
Start: 2021-02-11 — End: 2021-02-24
  Administered 2021-02-11 – 2021-02-21 (×4): 10 mg via ORAL

## 2021-02-11 MED FILL — DULOXETINE HCL 60 MG PO CPEP: 60 mg | ORAL | Qty: 1

## 2021-02-11 MED FILL — SPIRONOLACTONE 50 MG PO TABS: 50 mg | ORAL | Qty: 1

## 2021-02-11 MED FILL — METOPROLOL TARTRATE 25 MG PO TABS: 25 mg | ORAL | Qty: 1

## 2021-02-11 MED FILL — DIGOXIN 0.25 MG/ML IJ SOLN: 0.25 mg/mL | INTRAMUSCULAR | Qty: 2

## 2021-02-11 MED FILL — ELIQUIS 5 MG PO TABS: 5 mg | ORAL | Qty: 1

## 2021-02-11 MED FILL — ONDANSETRON HCL 4 MG/2ML IJ SOLN: 4 MG/2ML | INTRAMUSCULAR | Qty: 2

## 2021-02-11 MED FILL — LORAZEPAM 2 MG/ML IJ SOLN: 2 mg/mL | INTRAMUSCULAR | Qty: 1

## 2021-02-11 MED FILL — HEPARIN SOD (PORCINE) IN D5W 100 UNIT/ML IV SOLN: 100 [IU]/mL | INTRAVENOUS | Qty: 250

## 2021-02-11 MED FILL — HYDROXYZINE HCL 10 MG PO TABS: 10 mg | ORAL | Qty: 1

## 2021-02-11 MED FILL — MELATONIN 5 MG PO TBDP: 5 mg | ORAL | Qty: 1

## 2021-02-11 MED FILL — HYDROXYCHLOROQUINE SULFATE 200 MG PO TABS: 200 mg | ORAL | Qty: 2

## 2021-02-11 NOTE — Progress Notes (Signed)
Notified by lab of critical Potassium Level of 6.59 at 2352.  Specimen was hemolyzed.  Dr. Sharee Holster made aware via Perfect Serve and order placed to redraw at 2354.  Result came back at 4.3, Mag 2.1, Troponin <0.01. Perfect Serve sent to Dr. Sharee Holster to make aware.

## 2021-02-11 NOTE — Consults (Signed)
Nutrition Education     Educated on CHF guidelines   Learners: Patient   Readiness: Acceptance   Method: Explanation and Handout   Response: Verbalizes Understanding   Contact name and number provided.    RD consulted to provide CHF diet education. Reviewed Transport planner and provided handout. Discussed decreasing sodium intakes by not salting foods, requesting food to be cooked without salt when eating out, monitoring food labels specifically for processed foods and condiments, and rinsing canned foods. Encouraged pt to take daily weights to monitor fluid status and keep fluid consumption <2L/d. Pt reports poor appetite for ~1 months d/t nausea and feeling unwell. RD to send Ensure clears to increase po.      Daneil Dolin, RD, LD  Contact Number: [phone]

## 2021-02-11 NOTE — Plan of Care (Signed)
Problem: Chronic Conditions and Co-morbidities  Goal: Patient's chronic conditions and co-morbidity symptoms are monitored and maintained or improved  Outcome: Progressing  Flowsheets (Taken 02/11/2021 0322)  Care Plan - Patient's Chronic Conditions and Co-Morbidity Symptoms are Monitored and Maintained or Improved:   Monitor and assess patient's chronic conditions and comorbid symptoms for stability, deterioration, or improvement   Collaborate with multidisciplinary team to address chronic and comorbid conditions and prevent exacerbation or deterioration     Problem: Respiratory - Adult  Goal: Achieves optimal ventilation and oxygenation  Outcome: Progressing  Flowsheets (Taken 02/11/2021 0322)  Achieves optimal ventilation and oxygenation:   Assess for changes in respiratory status   Assess for changes in mentation and behavior   Position to facilitate oxygenation and minimize respiratory effort   Oxygen supplementation based on oxygen saturation or arterial blood gases   Encourage broncho-pulmonary hygiene including cough, deep breathe, incentive spirometry   Assess the need for suctioning and aspirate as needed   Respiratory therapy support as indicated   Assess and instruct to report shortness of breath or any respiratory difficulty     Problem: Cardiovascular - Adult  Goal: Maintains optimal cardiac output and hemodynamic stability  Outcome: Progressing  Flowsheets (Taken 02/11/2021 0322)  Maintains optimal cardiac output and hemodynamic stability:   Monitor blood pressure and heart rate   Monitor urine output and notify Licensed Independent Practitioner for values outside of normal range   Assess for signs of decreased cardiac output  Note: Pt still on A. Flutter. Had one episode of BP 64/54 during rapid response but had more stable trend after few hours. MD residents aware of the significant event. Significant lab results noted. Will continuously monitor.      Problem: Cardiovascular - Adult  Goal: Absence of  cardiac dysrhythmias or at baseline  Outcome: Progressing  Flowsheets (Taken 02/11/2021 0326)  Absence of cardiac dysrhythmias or at baseline:   Monitor cardiac rate and rhythm   Assess for signs of decreased cardiac output   Administer antiarrhythmia medication and electrolyte replacement as ordered     Problem: Metabolic/Fluid and Electrolytes - Adult  Goal: Electrolytes maintained within normal limits  Outcome: Progressing  Flowsheets (Taken 02/11/2021 0329)  Electrolytes maintained within normal limits:   Monitor labs and assess patient for signs and symptoms of electrolyte imbalances   Administer electrolyte replacement as ordered   Monitor response to electrolyte replacements, including repeat lab results as appropriate   Instruct patient on fluid and nutrition restrictions as appropriate     Problem: Safety - Adult  Goal: Free from fall injury  Outcome: Progressing  Note: Pt is a Fall Risk. See Lattie Corns Fall Risk Score. Pt bed in low position and side rails up. Call light and belongings in reach. Pt encouraged to call for assistance. Will continue with hourly rounds for PO intake, pain needs, toileting, and repositioning as needed.        Problem: ABCDS Injury Assessment  Goal: Absence of physical injury  Outcome: Progressing  Flowsheets (Taken 02/11/2021 0329)  Absence of Physical Injury: Implement safety measures based on patient assessment

## 2021-02-11 NOTE — Progress Notes (Signed)
Patient had 9 beats V-tach on tele.  Into room and patient sleeping, awakened and asymptomatic.  Vitals signs obtained reveal BP 115/82, HR 96, SpO2 97% on RA.  Perfect Serve sent to Dr. Sharee Holster to make aware.

## 2021-02-11 NOTE — Progress Notes (Signed)
Patient with continued complaints of feeling like she can't breath.  Diaphoretic, cool and clammy.  Lays down briefly and states she has a dream that wakes her up, can't breath and has to sit up.  BP checked while sitting gave a reading 68/40 and complaints of dizziness.  Layed back down and BP 128/100.  Dr. Sharee Holster made aware and to bedside to evaluate.  Lasix on hold at that time.  New order given for Ativan 0.5mg  IV x1.  Patient is sleeping at this time.  SpO2 99% on 2L/NC while sleeping. Aflutter, on tele, HR 102.  Last BP 106/74.

## 2021-02-11 NOTE — Plan of Care (Signed)
Problem: Discharge Planning  Goal: Discharge to home or other facility with appropriate resources  Outcome: Progressing  Flowsheets (Taken 02/11/2021 0904)  Discharge to home or other facility with appropriate resources: Identify barriers to discharge with patient and caregiver     Problem: Chronic Conditions and Co-morbidities  Goal: Patient's chronic conditions and co-morbidity symptoms are monitored and maintained or improved  02/11/2021 1036 by Benedetto Coons, RN  Outcome: Progressing  Flowsheets  Taken 02/11/2021 1036  Care Plan - Patient's Chronic Conditions and Co-Morbidity Symptoms are Monitored and Maintained or Improved:   Monitor and assess patient's chronic conditions and comorbid symptoms for stability, deterioration, or improvement   Collaborate with multidisciplinary team to address chronic and comorbid conditions and prevent exacerbation or deterioration  Taken 02/11/2021 0904  Care Plan - Patient's Chronic Conditions and Co-Morbidity Symptoms are Monitored and Maintained or Improved: Monitor and assess patient's chronic conditions and comorbid symptoms for stability, deterioration, or improvement  02/11/2021 0329 by Humberto Leep, RN  Outcome: Progressing  Flowsheets (Taken 02/11/2021 0322)  Care Plan - Patient's Chronic Conditions and Co-Morbidity Symptoms are Monitored and Maintained or Improved:   Monitor and assess patient's chronic conditions and comorbid symptoms for stability, deterioration, or improvement   Collaborate with multidisciplinary team to address chronic and comorbid conditions and prevent exacerbation or deterioration     Problem: Respiratory - Adult  Goal: Achieves optimal ventilation and oxygenation  02/11/2021 1036 by Benedetto Coons, RN  Outcome: Progressing  Flowsheets (Taken 02/11/2021 1036)  Achieves optimal ventilation and oxygenation: Assess for changes in respiratory status  02/11/2021 0329 by Humberto Leep, RN  Outcome: Progressing  Flowsheets (Taken 02/11/2021  0322)  Achieves optimal ventilation and oxygenation:   Assess for changes in respiratory status   Assess for changes in mentation and behavior   Position to facilitate oxygenation and minimize respiratory effort   Oxygen supplementation based on oxygen saturation or arterial blood gases   Encourage broncho-pulmonary hygiene including cough, deep breathe, incentive spirometry   Assess the need for suctioning and aspirate as needed   Respiratory therapy support as indicated   Assess and instruct to report shortness of breath or any respiratory difficulty     Problem: Cardiovascular - Adult  Goal: Maintains optimal cardiac output and hemodynamic stability  02/11/2021 1036 by Benedetto Coons, RN  Outcome: Progressing  Flowsheets  Taken 02/11/2021 1036  Maintains optimal cardiac output and hemodynamic stability: Monitor blood pressure and heart rate  Taken 02/11/2021 0904  Maintains optimal cardiac output and hemodynamic stability: Monitor blood pressure and heart rate  02/11/2021 0329 by Humberto Leep, RN  Outcome: Progressing  Flowsheets (Taken 02/11/2021 0322)  Maintains optimal cardiac output and hemodynamic stability:   Monitor blood pressure and heart rate   Monitor urine output and notify Licensed Independent Practitioner for values outside of normal range   Assess for signs of decreased cardiac output  Note: Pt still on A. Flutter. Had one episode of BP 64/54 during rapid response but had more stable trend after few hours. MD residents aware of the significant event. Significant lab results noted. Will continuously monitor.   Goal: Absence of cardiac dysrhythmias or at baseline  02/11/2021 1036 by Benedetto Coons, RN  Outcome: Progressing  Flowsheets  Taken 02/11/2021 1036  Absence of cardiac dysrhythmias or at baseline:   Monitor cardiac rate and rhythm   Assess for signs of decreased cardiac output  Taken 02/11/2021 0904  Absence of cardiac dysrhythmias or at baseline: Monitor cardiac rate and rhythm  02/11/2021  7322  by Humberto Leep, RN  Outcome: Progressing  Flowsheets (Taken 02/11/2021 [phone])  Absence of cardiac dysrhythmias or at baseline:   Monitor cardiac rate and rhythm   Assess for signs of decreased cardiac output   Administer antiarrhythmia medication and electrolyte replacement as ordered     Problem: Metabolic/Fluid and Electrolytes - Adult  Goal: Electrolytes maintained within normal limits  02/11/2021 1036 by Benedetto Coons, RN  Outcome: Progressing  Flowsheets  Taken 02/11/2021 1036  Electrolytes maintained within normal limits:   Monitor labs and assess patient for signs and symptoms of electrolyte imbalances   Administer electrolyte replacement as ordered  Taken 02/11/2021 0904  Electrolytes maintained within normal limits: Monitor labs and assess patient for signs and symptoms of electrolyte imbalances  02/11/2021 0329 by Humberto Leep, RN  Outcome: Progressing  Flowsheets (Taken 02/11/2021 0329)  Electrolytes maintained within normal limits:   Monitor labs and assess patient for signs and symptoms of electrolyte imbalances   Administer electrolyte replacement as ordered   Monitor response to electrolyte replacements, including repeat lab results as appropriate   Instruct patient on fluid and nutrition restrictions as appropriate  Goal: Hemodynamic stability and optimal renal function maintained  Outcome: Progressing  Flowsheets  Taken 02/11/2021 1036  Hemodynamic stability and optimal renal function maintained: Monitor labs and assess for signs and symptoms of volume excess or deficit  Taken 02/11/2021 0904  Hemodynamic stability and optimal renal function maintained: Monitor labs and assess for signs and symptoms of volume excess or deficit     Problem: Safety - Adult  Goal: Free from fall injury  02/11/2021 1036 by Benedetto Coons, RN  Outcome: Progressing  Flowsheets (Taken 02/11/2021 1036)  Free From Fall Injury: Instruct family/caregiver on patient safety  Note: Discussed falls precautions and interventions  used to promote patient safety  02/11/2021 0329 by Humberto Leep, RN  Outcome: Progressing  Note: Pt is a Fall Risk. See Lattie Corns Fall Risk Score. Pt bed in low position and side rails up. Call light and belongings in reach. Pt encouraged to call for assistance. Will continue with hourly rounds for PO intake, pain needs, toileting, and repositioning as needed.

## 2021-02-11 NOTE — Progress Notes (Signed)
Patient performs shift updates to husband.

## 2021-02-11 NOTE — Progress Notes (Signed)
Phone call placed to husband Jennette Kettle. Updated on overnight events. No questions at this time.

## 2021-02-11 NOTE — Consults (Signed)
[facility]   Cardiac Electrophysiology Consultation   Date: 02/11/2021  Admit Date:  02/09/2021  Reason for Consultation: Aflutter and CHF  Consult Requesting Physician: Dennison Mascot, MD     Chief Complaint   Patient presents with   ??? Shortness of Breath     HPI: Molly [name] is a 60 y.o. female with a past medical history significant for non-Hodgkin's lymphoma of the left groin, s/p radiation of the left groin, lupus, COVID-19 in 2020, who started noticing progressively worsening shortness of breath for the past 6 weeks or so.  She have 40 pounds weight gain.  With the symptoms, she went to the primary care provider and her echocardiogram showed EF of 20%.  Secondary to worsening symptoms, she was referred to the ER for further evaluation management.  Patient was noted to be in atrial flutter with poorly controlled ventricular rate.  Her LV ejection fraction is 20%, with diffuse hypokinesis.  She is being treated aggressively with IV diuresis and rate controlling medications.  She continues to be in atrial flutter with a poorly controlled ventricular rate.  Currently, she is off oxygen and saturating well.  She was not on oral anticoagulation as an outpatient.  She is also on IV heparin infusion.    Impression:  1.  Atrial flutter with rapid ventricular rate  2.  Newly diagnosed severe, acute, systolic congestive heart failure, LV ejection fraction 20%, Diffuse hypokinesis  3.  History of non-Hodgkin's lymphoma, status post radiation to the left groin      Plan:  1.  Right atrial flutter ablation tomorrow -by 11 AM; continue her current medications; stop heparin; started on Eliquis 5 mg p.o. twice daily  2.  We will start with intracardiac echocardiogram and if there is no left atrial appendage clot, then we will proceed with right atrial flutter ablation  3.  Monitored anesthesia care  4.  No need for ischemic evaluation before ablation procedure  5.  Plan for Lexiscan, after being in sinus rhythm  today  6.  Optimization of heart failure medications, as per Dr. Harrison Mons  7.  N.p.o. after midnight  8.  Discussed with patient, and her husband (psychiatrist) over the phone  9.  Discussed with Dr. Emi Belfast MD   Cardiac Electrophysiology  [facility]  Office [phone]      Past Medical History:   Diagnosis Date   ??? Amenorrhea    ??? Cancer [facility])    ??? History of non-Hodgkin's lymphoma 2006    treated with radiation and chemo   ??? Obesity    ??? Systemic lupus     followed by Dr. Harley Hallmark        Past Surgical History:   Procedure Laterality Date   ??? JOINT REPLACEMENT     ??? KNEE ARTHROPLASTY Left 10/25/2016    left total knee replacement using computer navigation [facility])   ??? KNEE SURGERY Left     ACL and meniscus repair   ??? LEEP      in 1990's       Allergies   Allergen Reactions   ??? Sulfa Antibiotics Rash       Social History:  Reviewed.  reports that she quit smoking about 14 years ago. Her smoking use included cigarettes. She has a 9.00 pack-year smoking history. She has never used smokeless tobacco. She reports current alcohol use of about 1.0 standard drink of alcohol per week.  She reports that she does not use drugs.     Family History:  Reviewed. family history includes Cancer (age of onset: 72) in her sister; Dementia in her mother; High Cholesterol in her brother, brother, and sister; Other in her father; Thyroid Disease in her mother and sister.   No premature CAD.     Review of System:  All other systems reviewed except for that noted above. Pertinent negatives and positives are:     Objective      ?? General: negative for fever, chills   ?? Ophthalmic ROS: negative for - eye pain or loss of vision  ?? ENT ROS: negative for - headaches, sore throat   ?? Respiratory: negative for - cough, sputum  ?? Cardiovascular: Reviewed in HPI  ?? Gastrointestinal: negative for - abdominal pain, diarrhea, N/V  ?? Hematology: negative for - bleeding, blood clots, bruising or  jaundice  ?? Genito-Urinary:  negative for - Dysuria or incontinence  ?? Musculoskeletal: negative for - Joint swelling, muscle pain  ?? Neurological: negative for - confusion, dizziness, headaches   ?? Psychiatric: No anxiety, no depression.  ?? Dermatological: negative for - rash    Physical Examination:  Vitals:    02/11/21 1227   BP: 112/74   Pulse: 100   Resp: 20   Temp: 97.8 ??F (36.6 ??C)   SpO2: 99%        Intake/Output Summary (Last 24 hours) at 02/11/2021 1253  Last data filed at 02/11/2021 1000  Gross per 24 hour   Intake 1354.09 ml   Output 875 ml   Net 479.09 ml     In: 1834.1 [P.O.:1280; I.V.:454.1]  Out: 2575    Wt Readings from Last 3 Encounters:   02/11/21 (!) 310 lb 3 oz (140.7 kg)   02/01/21 (!) 336 lb (152.4 kg)   01/29/21 (!) 331 lb (150.1 kg)     Temp  Avg: 97 ??F (36.1 ??C)  Min: 93 ??F (33.9 ??C)  Max: 98.2 ??F (36.8 ??C)  Pulse  Avg: 110  Min: 95  Max: 130  BP  Min: 64/54  Max: 145/106  SpO2  Avg: 98.8 %  Min: 97 %  Max: 100 %    ?? Telemetry: Atrial flutter  ?? Constitutional: Alert. Oriented to person, place, and time. No distress.   ?? Head: Normocephalic and atraumatic.   ?? Mouth/Throat: Lips appear moist. Oropharynx is clear and moist.  ?? Eyes: Conjunctivae normal. EOM are normal.   ?? Neck: Neck supple. No lymphadenopathy. No rigidity. No JVD present.   ?? Cardiovascular: Tachycardia, irregular rhythm. Normal S1&S2. Carotid pulse 2+ bilaterally.    ?? Pulmonary/Chest: Bilateral respiratory sounds present. No respiratory accessory muscle use.  No wheezes, No rhonchi.    ?? Abdominal: Soft. Normal bowel sounds present. No distension, No tenderness. No splenomegaly. No hernia.  ?? Musculoskeletal: No tenderness. 2+ edema    ?? Lymphadenopathy: Has no cervical adenopathy.   ?? Neurological: Alert and oriented. Cranial nerve II-XII grossly intact, No gross deficit to touch.  ?? Skin: Skin is warm and dry. No rash, lesions, ulcerations noted.  ?? Psychiatric: No anxiety nor agitation.    Labs:  Reviewed.   Recent Labs      02/10/21  1431 02/10/21  1431 02/10/21  2221 02/10/21  2359 02/11/21  0656   NA 130*  --  129*  --  132*   K 3.5   < > 6.6* 4.3 4.4   CL 91*  --  89*  --  90*   CO2 25  --  19*  --  27   PHOS 3.7  --   --   --   --    BUN 13  --  16  --  20   CREATININE 1.0  --  1.2*  --  1.4*    < > = values in this interval not displayed.     Recent Labs     02/10/21  0522 02/10/21  1037 02/11/21  0130   WBC 8.0 8.5 11.1*   HGB 12.5 13.0 13.1   HCT 37.6 39.6 40.4   MCV 90.7 90.7 92.2   PLT 242 263 224     Lab Results   Component Value Date    CKTOTAL 106 11/29/2013    TROPONINI <0.01 02/10/2021     No results found for: BNP  Lab Results   Component Value Date    PROTIME 15.8 02/10/2021    PROTIME 11.8 10/25/2016    PROTIME 12.1 09/20/2016    INR 1.27 02/10/2021    INR 1.04 10/25/2016    INR 1.07 09/20/2016     Lab Results   Component Value Date    CHOL 121 02/10/2021    HDL 33 02/10/2021    TRIG 82 02/10/2021       Diagnostic and imaging results reviewed.     I independently reviewed the ECG and telemetry.     Scheduled Meds:  ??? spironolactone  50 mg Oral Daily   ??? metoprolol tartrate  25 mg Oral BID   ??? melatonin  5 mg Oral Nightly   ??? DULoxetine  60 mg Oral Daily   ??? hydroxychloroquine  400 mg Oral Daily   ??? sodium chloride flush  5-40 mL IntraVENous 2 times per day     Continuous Infusions:  ??? heparin (PORCINE) Infusion 11 Units/kg/hr (02/11/21 0930)   ??? sodium chloride     ??? [Held by provider] furosemide Stopped (02/10/21 2334)     PRN Meds:.heparin (porcine), heparin (porcine), hydrOXYzine HCl, albuterol, sodium chloride flush, sodium chloride, ondansetron **OR** ondansetron, polyethylene glycol, acetaminophen **OR** acetaminophen     Assessment:   Patient Active Problem List    Diagnosis Date Noted   ??? Systemic lupus erythematosus (HCC) 09/22/2014   ??? CHF (congestive heart failure), NYHA class I, acute on chronic, combined (HCC) 02/09/2021   ??? Acute on chronic congestive heart failure (HCC) 02/01/2021   ??? SOB (shortness  of breath) 01/29/2021   ??? Acute bacterial sinusitis 12/30/2020   ??? Seasonal allergies 05/08/2019   ??? 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 High risk medication use 01/25/2016   ??? Morbid obesity with BMI of 40.0-44.9, adult (HCC) 02/07/2014   ??? Vitamin D deficiency 09/19/2013   ??? Impaired glucose tolerance 02/06/2013   ??? HLD (hyperlipidemia) 02/06/2013   ??? History of non-Hodgkin's lymphoma       Active Hospital Problems    Diagnosis Date Noted   ??? CHF (congestive heart failure), NYHA class I, acute on chronic, combined (HCC) [I50.43] 02/09/2021     Priority: Medium       Thank you for allowing me to participate in the care of Molly [name] . If you have any questions/comments, please do not hesitate to contact us.      Guadelupe Sabin MD   Cardiac Electrophysiology  [facility]    For any EP related issues after 5 PM, contact [facility] on call cardiology through [phone].

## 2021-02-11 NOTE — Progress Notes (Signed)
Progress Note    Admit Date: 02/09/2021  Day: 3  Diet: ADULT DIET; Regular; Low Sodium (2 gm)    CC: SOB.    Interval history: Patient seen at bedside, she had severe anxiety overnight and diaphoresis. RR was called for this, patient was given 0.5 mg Ativan with good effect. She endorses less anxiety this morning. She states she feels "less swollen" endorsing that her SOB and swelling in her legs has improved significantly. She denies recent fever/chills, n/v, abd pain, acute urinary or bowel abnormalities. She continues to have some sinus congestion.     Medications:     Scheduled Meds:  ??? digoxin  500 mcg IntraVENous Once   ??? spironolactone  50 mg Oral Daily   ??? metoprolol tartrate  25 mg Oral BID   ??? melatonin  5 mg Oral Nightly   ??? DULoxetine  60 mg Oral Daily   ??? hydroxychloroquine  400 mg Oral Daily   ??? sodium chloride flush  5-40 mL IntraVENous 2 times per day     Continuous Infusions:  ??? heparin (PORCINE) Infusion 11 Units/kg/hr (02/11/21 0646)   ??? sodium chloride     ??? furosemide Stopped (02/10/21 2334)     PRN Meds:heparin (porcine), heparin (porcine), hydrOXYzine HCl, albuterol, sodium chloride flush, sodium chloride, ondansetron **OR** ondansetron, polyethylene glycol, acetaminophen **OR** acetaminophen    Objective:   Vitals:   T-max:  Patient Vitals for the past 8 hrs:   BP Temp Temp src Pulse Resp SpO2 Weight   02/11/21 0440 115/82 -- -- 96 -- 97 % --   02/11/21 0337 -- -- -- -- -- -- (!) 310 lb 3 oz (140.7 kg)   02/11/21 0300 106/73 97.5 ??F (36.4 ??C) Oral 98 18 99 % --   02/11/21 0200 (!) 110/91 -- -- (!) 101 -- -- --       Intake/Output Summary (Last 24 hours) at 02/11/2021 0910  Last data filed at 02/11/2021 0647  Gross per 24 hour   Intake 1134.09 ml   Output 875 ml   Net 259.09 ml       Review of Systems  Per Interval hx.    Physical Exam  HENT:      Mouth/Throat:      Mouth: Mucous membranes are moist.      Pharynx: Oropharynx is clear.   Eyes:      Extraocular Movements: Extraocular movements  intact.      Conjunctiva/sclera: Conjunctivae normal.   Cardiovascular:      Rate and Rhythm: Normal rate and regular rhythm.      Pulses: Normal pulses.      Heart sounds: Normal heart sounds.   Pulmonary:      Effort: Pulmonary effort is normal.      Breath sounds: Rales present.      Comments: Minimal rales at bases.  Abdominal:      General: There is distension.   Musculoskeletal:      Right lower leg: Edema present.      Left lower leg: Edema present.      Comments: 2+ edema b/l LE, L slightly worse than R.   Skin:     Capillary Refill: Capillary refill takes less than 2 seconds.   Neurological:      General: No focal deficit present.      Mental Status: She is alert and oriented to person, place, and time. Mental status is at baseline.      Comments: No tremors appreciated.  LABS:    CBC:   Recent Labs     02/10/21  0522 02/10/21  1037 02/11/21  0130   WBC 8.0 8.5 11.1*   HGB 12.5 13.0 13.1   HCT 37.6 39.6 40.4   PLT 242 263 224   MCV 90.7 90.7 92.2     Renal:    Recent Labs     02/10/21  0522 02/10/21  1431 02/10/21  1431 02/10/21  2221 02/10/21  2357 02/10/21  2359 02/11/21  0130 02/11/21  0656   NA  --  130*  --  129*  --   --   --  132*   K  --  3.5   < > 6.6*  --  4.3  --  4.4   CL  --  91*  --  89*  --   --   --  90*   CO2  --  25  --  19*  --   --   --  27   BUN  --  13  --  16  --   --   --  20   CREATININE  --  1.0  --  1.2*  --   --   --  1.4*   GLUCOSE  --  113*  --  132*  --   --   --  107*   CALCIUM  --  9.2  --  9.3  --   --   --  8.9   MG   < >  --   --   --  2.10  --  1.90 1.80   PHOS  --  3.7  --   --   --   --   --   --    ANIONGAP  --  14  --  21*  --   --   --  15    < > = values in this interval not displayed.     Hepatic:   Recent Labs     02/10/21  1431   LABALBU 4.3     Troponin:   Recent Labs     02/09/21  1840 02/10/21  2357   TROPONINI <0.01 <0.01     BNP: No results for input(s): BNP in the last 72 hours.  Lipids:   Recent Labs     02/10/21  0522   CHOL 121   HDL 33*     ABGs:   No results for input(s): PHART, PCO2ART, PO2ART, HCO3ART, BEART, THGBART, O2SATART, TCO2ART in the last 72 hours.    INR:   Recent Labs     02/10/21  1037   INR 1.27*     Lactate: No results for input(s): LACTATE in the last 72 hours.  Cultures:  -----------------------------------------------------------------  RAD:   CT CHEST PULMONARY EMBOLISM W CONTRAST   Final Result      1. No evidence of any pulmonary embolism.   2. Four-chamber cardiac enlargement with reflux of contrast into the hepatic veins. This can be seen with elevated right heart pressures.   3. No acute pulmonary process.      XR CHEST PORTABLE   Final Result      Stable cardiac enlargement      No acute consolidation                Assessment/Plan:     60 yo female with a PMH of NHL, SLE who presented to the ED with chief complaints  of cough and shortness of breath for the past 6 weeks that got worse recently in the past few days  ??  Acute Exacerbation of New-Onset HFrEF   Recent ECHO on 6/17 with severe dilation of LV, EF of <20%, severe diffuse hypokinesis and grade 3 diastolic dysfunction. Also mildly elevated PAP.  ProBNP 4707, down from ~7k 01/29/21. S/p 60 mg IV Lasix in ED.   >>>Unclear etiology, differentials are broad at this point. Definitely need to rule out ischemia with stress testing/cath when she is more optimized. Consider dilated cardiomyopathy 2/2 delayed Anthracycline toxicity though this would typically present at least with 2-3 years of chemo tx. Also consider viral cardiomyopathy considering recent URI symptoms she reports.   - Lasix gtt; holding at this time   - continue Toprol XL ER 25 mg PO daily  - continue Aldactone 50 mg daily   - Cards c/s - appreciate reccs     New-Onset Atrial Flutter  Patient endorses SOB however no chest pain or palpitations. She states she feels anxious. Tachycardic on exam.   Arrived to ED in Atrial Flutter based on EKG obtained at admission. Given Metoprolol 2.5 mg once on admission.    - consider  Dig 500 mcg x1 if remains in Aflutter and electrolytes wnl  - continue Toprol XL ER 25 mg PO daily  - now in NSR   - Cards c/s - reccs appreciated; can consider TEE w/ DC cardioversion when patient euvolemic if still having Afib/Flutter     AKI   Was on Lasix drip at 10/h for majority of day yesterday. Baseline Cr and on arrival wnl. Cr uptrended to 1.4 on this mornings labs. Likely 2/2 diuresis w/ Lasix gtt.   - holding Lasix drip at this time  - continue to monitor with RFP BID     Anxiety  Patient denies any history of. Appears very anxious this admission. Consider related to Atrial flutter and its ensuing symptoms.  - continue Atarax 10 mg TID PRN  - can spot dose Ativan 0.5 mg for uncontrolled episodes     Chronic Medical Conditions:  SLE - continue Plaquenil 400 mg PO daily, Cymbalta 60 mg PO daily   OA - holding Relafen       Code Status: FULL CODE  FEN: Adult Diet; Regular   PPX: Lovenox  DISPO: GMF     Margarita Sermons, MD, PGY-1  02/11/21  9:10 AM    This patient has been staffed and discussed with Dennison Mascot, MD.

## 2021-02-12 ENCOUNTER — Inpatient Hospital Stay: Admit: 2021-02-13 | Payer: MEDICAID | Primary: Gerontology

## 2021-02-12 ENCOUNTER — Inpatient Hospital Stay: Admit: 2021-02-12 | Payer: MEDICAID | Attending: Anesthesiology | Primary: Gerontology

## 2021-02-12 ENCOUNTER — Inpatient Hospital Stay: Admit: 2021-02-12 | Payer: MEDICAID | Primary: Gerontology

## 2021-02-12 LAB — COMPREHENSIVE METABOLIC PANEL
ALT: 79 U/L — ABNORMAL HIGH (ref 10–40)
AST: 86 U/L — ABNORMAL HIGH (ref 15–37)
Albumin/Globulin Ratio: 2.5 — ABNORMAL HIGH (ref 1.1–2.2)
Albumin: 4.7 g/dL (ref 3.4–5.0)
Alkaline Phosphatase: 138 U/L — ABNORMAL HIGH (ref 40–129)
Anion Gap: 27 — ABNORMAL HIGH (ref 3–16)
BUN: 36 mg/dL — ABNORMAL HIGH (ref 7–20)
CO2: 12 mmol/L — CL (ref 21–32)
Calcium: 9.7 mg/dL (ref 8.3–10.6)
Chloride: 87 mmol/L — ABNORMAL LOW (ref 99–110)
Creatinine: 1.9 mg/dL — ABNORMAL HIGH (ref 0.6–1.1)
GFR African American: 33 — AB (ref >60)
GFR Non-African American: 27 — AB (ref >60)
Glucose: 111 mg/dL — ABNORMAL HIGH (ref 70–99)
Potassium: 5.3 mmol/L — ABNORMAL HIGH (ref 3.5–5.1)
Sodium: 126 mmol/L — ABNORMAL LOW (ref 136–145)
Total Bilirubin: 1.9 mg/dL — ABNORMAL HIGH (ref 0.0–1.0)
Total Protein: 6.6 g/dL (ref 6.4–8.2)

## 2021-02-12 LAB — CBC WITH AUTO DIFFERENTIAL
Basophils %: 0.3 %
Basophils %: 0 %
Basophils Absolute: 0 10*3/uL (ref 0.0–0.2)
Basophils Absolute: 0 10*3/uL (ref 0.0–0.2)
Eosinophils %: 0.1 %
Eosinophils %: 0 %
Eosinophils Absolute: 0 10*3/uL (ref 0.0–0.6)
Eosinophils Absolute: 0 10*3/uL (ref 0.0–0.6)
Hematocrit: 42 % (ref 36.0–48.0)
Hematocrit: 41.6 % (ref 36.0–48.0)
Hemoglobin: 13.6 g/dL (ref 12.0–16.0)
Hemoglobin: 13.6 g/dL (ref 12.0–16.0)
Lymphocytes %: 19.3 %
Lymphocytes %: 15 %
Lymphocytes Absolute: 2.4 10*3/uL (ref 1.0–5.1)
Lymphocytes Absolute: 2 10*3/uL (ref 1.0–5.1)
MCH: 29.6 pg (ref 26.0–34.0)
MCH: 30.4 pg (ref 26.0–34.0)
MCHC: 32.5 g/dL (ref 31.0–36.0)
MCHC: 32.7 g/dL (ref 31.0–36.0)
MCV: 91.3 fL (ref 80.0–100.0)
MCV: 92.9 fL (ref 80.0–100.0)
MPV: 8.8 fL (ref 5.0–10.5)
MPV: 9.4 fL (ref 5.0–10.5)
Monocytes %: 10.8 %
Monocytes %: 7 %
Monocytes Absolute: 1.3 10*3/uL (ref 0.0–1.3)
Monocytes Absolute: 0.9 10*3/uL (ref 0.0–1.3)
Neutrophils %: 69.5 %
Neutrophils %: 78 %
Neutrophils Absolute: 8.5 10*3/uL — ABNORMAL HIGH (ref 1.7–7.7)
Neutrophils Absolute: 10.3 10*3/uL — ABNORMAL HIGH (ref 1.7–7.7)
Platelets: 250 10*3/uL (ref 135–450)
Platelets: 249 10*3/uL (ref 135–450)
RBC: 4.6 M/uL (ref 4.00–5.20)
RBC: 4.48 M/uL (ref 4.00–5.20)
RDW: 15.3 % (ref 12.4–15.4)
RDW: 15.3 % (ref 12.4–15.4)
WBC: 12.3 10*3/uL — ABNORMAL HIGH (ref 4.0–11.0)
WBC: 13.2 10*3/uL — ABNORMAL HIGH (ref 4.0–11.0)

## 2021-02-12 LAB — POCT ARTERIAL
Base Excess, Arterial: -12 — ABNORMAL LOW (ref <=3)
Base Excess, Arterial: -6 — ABNORMAL LOW (ref <=3)
Calcium, Ionized: 1.24 mmol/L (ref 1.12–1.32)
Calcium, Ionized: 1.07 mmol/L — ABNORMAL LOW (ref 1.12–1.32)
HCO3, Arterial: 16.4 mmol/L — ABNORMAL LOW (ref 21.0–29.0)
HCO3, Arterial: 19.9 mmol/L — ABNORMAL LOW (ref 21.0–29.0)
Lactate: 7.22 mmol/L (ref 0.40–2.00)
Lactate: 6.85 mmol/L (ref 0.40–2.00)
O2 Sat, Arterial: 100 % (ref 93–100)
O2 Sat, Arterial: 100 % (ref 93–100)
POC Glucose: 34 mg/dl — CL (ref 70–99)
POC Glucose: 106 mg/dl — ABNORMAL HIGH (ref 70–99)
POC Potassium: 5.4 mmol/L — ABNORMAL HIGH (ref 3.5–5.1)
POC Potassium: 4.7 mmol/L (ref 3.5–5.1)
POC Sodium: 129 mmol/L — ABNORMAL LOW (ref 136–145)
POC Sodium: 130 mmol/L — ABNORMAL LOW (ref 136–145)
TCO2, Arterial: 18 mmol/L
TCO2, Arterial: 21 mmol/L
pCO2, Arterial: 44.7 mm Hg (ref 35.0–45.0)
pCO2, Arterial: 39.4 mm Hg (ref 35.0–45.0)
pH, Arterial: 7.173 — CL (ref 7.350–7.450)
pH, Arterial: 7.312 — ABNORMAL LOW (ref 7.350–7.450)
pO2, Arterial: 446.9 mm Hg (ref 75.0–108.0)
pO2, Arterial: 551.4 mm Hg (ref 75.0–108.0)

## 2021-02-12 LAB — BASIC METABOLIC PANEL
Anion Gap: 16 (ref 3–16)
Anion Gap: 18 — ABNORMAL HIGH (ref 3–16)
BUN: 26 mg/dL — ABNORMAL HIGH (ref 7–20)
BUN: 34 mg/dL — ABNORMAL HIGH (ref 7–20)
CO2: 25 mmol/L (ref 21–32)
CO2: 22 mmol/L (ref 21–32)
Calcium: 9 mg/dL (ref 8.3–10.6)
Calcium: 9.1 mg/dL (ref 8.3–10.6)
Chloride: 87 mmol/L — ABNORMAL LOW (ref 99–110)
Chloride: 86 mmol/L — ABNORMAL LOW (ref 99–110)
Creatinine: 1.5 mg/dL — ABNORMAL HIGH (ref 0.6–1.1)
Creatinine: 1.6 mg/dL — ABNORMAL HIGH (ref 0.6–1.1)
GFR African American: 43 — AB (ref >60)
GFR African American: 40 — AB (ref >60)
GFR Non-African American: 36 — AB (ref >60)
GFR Non-African American: 33 — AB (ref >60)
Glucose: 147 mg/dL — ABNORMAL HIGH (ref 70–99)
Glucose: 96 mg/dL (ref 70–99)
Potassium: 4.2 mmol/L (ref 3.5–5.1)
Potassium: 4.3 mmol/L (ref 3.5–5.1)
Sodium: 128 mmol/L — ABNORMAL LOW (ref 136–145)
Sodium: 126 mmol/L — ABNORMAL LOW (ref 136–145)

## 2021-02-12 LAB — MAGNESIUM: Magnesium: 2.1 mg/dL (ref 1.80–2.40)

## 2021-02-12 LAB — LACTIC ACID: Lactic Acid: 8.5 mmol/L (ref 0.4–2.0)

## 2021-02-12 LAB — BRAIN NATRIURETIC PEPTIDE: Pro-BNP: 6744 pg/mL — ABNORMAL HIGH (ref 0–124)

## 2021-02-12 MED ORDER — LIDOCAINE HCL 2 % IJ SOLN
2 % | INTRAMUSCULAR | Status: DC | PRN
Start: 2021-02-12 — End: 2021-02-12
  Administered 2021-02-12: 16:00:00 50 via INTRAVENOUS

## 2021-02-12 MED ORDER — SUCCINYLCHOLINE CHLORIDE 100 MG/5ML IV SOSY
100 MG/5ML | INTRAVENOUS | Status: DC | PRN
Start: 2021-02-12 — End: 2021-02-12
  Administered 2021-02-12: 16:00:00 200 via INTRAVENOUS

## 2021-02-12 MED ORDER — KETAMINE HCL 50 MG/ML IJ SOLN
50 | INTRAMUSCULAR | Status: AC
Start: 2021-02-12 — End: 2021-02-12

## 2021-02-12 MED ORDER — HYDROMORPHONE HCL 1 MG/ML IJ SOLN
1 MG/ML | INTRAMUSCULAR | Status: DC | PRN
Start: 2021-02-12 — End: 2021-02-12

## 2021-02-12 MED ORDER — ONDANSETRON HCL 4 MG/2ML IJ SOLN
4 MG/2ML | Freq: Once | INTRAMUSCULAR | Status: DC | PRN
Start: 2021-02-12 — End: 2021-02-12

## 2021-02-12 MED ORDER — OXYCODONE HCL 5 MG PO TABS
5 MG | ORAL | Status: DC | PRN
Start: 2021-02-12 — End: 2021-02-12

## 2021-02-12 MED ORDER — FUROSEMIDE 10 MG/ML IJ SOLN
10 MG/ML | INTRAMUSCULAR | Status: DC | PRN
Start: 2021-02-12 — End: 2021-02-12
  Administered 2021-02-12: 17:00:00 40 via INTRAVENOUS

## 2021-02-12 MED ORDER — GLUCOSE 4 G PO CHEW
4 g | ORAL | Status: DC | PRN
Start: 2021-02-12 — End: 2021-02-24

## 2021-02-12 MED ORDER — NORMAL SALINE FLUSH 0.9 % IV SOLN
0.9 % | Freq: Two times a day (BID) | INTRAVENOUS | Status: DC
Start: 2021-02-12 — End: 2021-02-12

## 2021-02-12 MED ORDER — DEXTROSE 10 % IV BOLUS
INTRAVENOUS | Status: DC | PRN
Start: 2021-02-12 — End: 2021-02-24
  Administered 2021-02-13: 125 mL via INTRAVENOUS

## 2021-02-12 MED ORDER — HEPARIN SODIUM (PORCINE) 1000 UNIT/ML IJ SOLN
1000 UNIT/ML | INTRAMUSCULAR | Status: DC | PRN
Start: 2021-02-12 — End: 2021-02-12
  Administered 2021-02-12: 16:00:00 5000 via INTRAVENOUS

## 2021-02-12 MED ORDER — SODIUM CHLORIDE 0.9 % IV SOLN
0.9 % | INTRAVENOUS | Status: DC
Start: 2021-02-12 — End: 2021-02-15
  Administered 2021-02-12: 23:00:00 0.2 ug/kg/h via INTRAVENOUS
  Administered 2021-02-13 – 2021-02-15 (×9): 0.5 ug/kg/h via INTRAVENOUS

## 2021-02-12 MED ORDER — LACTATED RINGERS IV SOLN
INTRAVENOUS | Status: DC | PRN
Start: 2021-02-12 — End: 2021-02-12
  Administered 2021-02-12: 16:00:00 via INTRAVENOUS

## 2021-02-12 MED ORDER — DEXTROSE 5 % IV SOLN
5 % | INTRAVENOUS | Status: DC | PRN
Start: 2021-02-12 — End: 2021-02-24

## 2021-02-12 MED ORDER — LABETALOL HCL 5 MG/ML IV SOLN
5 MG/ML | INTRAVENOUS | Status: DC | PRN
Start: 2021-02-12 — End: 2021-02-12

## 2021-02-12 MED ORDER — NORMAL SALINE FLUSH 0.9 % IV SOLN
0.9 % | Freq: Two times a day (BID) | INTRAVENOUS | Status: DC
Start: 2021-02-12 — End: 2021-02-24
  Administered 2021-02-13 – 2021-02-24 (×21): 10 mL via INTRAVENOUS

## 2021-02-12 MED ORDER — HYDRALAZINE HCL 20 MG/ML IJ SOLN
20 MG/ML | INTRAMUSCULAR | Status: DC | PRN
Start: 2021-02-12 — End: 2021-02-12

## 2021-02-12 MED ORDER — PROCHLORPERAZINE EDISYLATE 10 MG/2ML IJ SOLN
10 MG/2ML | Freq: Once | INTRAMUSCULAR | Status: DC | PRN
Start: 2021-02-12 — End: 2021-02-12

## 2021-02-12 MED ORDER — DEXTROSE 10 % IV BOLUS
INTRAVENOUS | Status: DC | PRN
Start: 2021-02-12 — End: 2021-02-24

## 2021-02-12 MED ORDER — GLUCAGON HCL RDNA (DIAGNOSTIC) 1 MG IJ SOLR
1 MG | INTRAMUSCULAR | Status: DC | PRN
Start: 2021-02-12 — End: 2021-02-24

## 2021-02-12 MED ORDER — ACETAMINOPHEN 325 MG PO TABS
325 MG | ORAL | Status: DC | PRN
Start: 2021-02-12 — End: 2021-02-24
  Administered 2021-02-20 – 2021-02-23 (×6): 650 mg via ORAL

## 2021-02-12 MED ORDER — PHENYLEPHRINE HCL 10 MG/ML SOLN (MIXTURES ONLY)
10 MG/ML | Status: DC | PRN
Start: 2021-02-12 — End: 2021-02-12
  Administered 2021-02-12: 18:00:00 200 via INTRAVENOUS
  Administered 2021-02-12: 17:00:00 100 via INTRAVENOUS
  Administered 2021-02-12 (×2): 200 via INTRAVENOUS
  Administered 2021-02-12: 16:00:00 100 via INTRAVENOUS
  Administered 2021-02-12 (×2): 200 via INTRAVENOUS
  Administered 2021-02-12: 16:00:00 100 via INTRAVENOUS
  Administered 2021-02-12 (×3): 200 via INTRAVENOUS
  Administered 2021-02-12 (×2): 100 via INTRAVENOUS

## 2021-02-12 MED ORDER — MIDAZOLAM HCL 2 MG/2ML IJ SOLN
2 | INTRAMUSCULAR | Status: AC
Start: 2021-02-12 — End: 2021-02-12

## 2021-02-12 MED ORDER — NOREPINEPHRINE BITARTRATE 1 MG/ML IV SOLN
1 MG/ML | INTRAVENOUS | Status: DC | PRN
Start: 2021-02-12 — End: 2021-02-12

## 2021-02-12 MED ORDER — NORMAL SALINE FLUSH 0.9 % IV SOLN
0.9 | INTRAVENOUS | Status: DC | PRN
Start: 2021-02-12 — End: 2021-02-12

## 2021-02-12 MED ORDER — MIDAZOLAM HCL 2 MG/2ML IJ SOLN
2 | INTRAMUSCULAR | Status: DC | PRN
Start: 2021-02-12 — End: 2021-02-12
  Administered 2021-02-12 (×2): 2 via INTRAVENOUS

## 2021-02-12 MED ORDER — SODIUM CHLORIDE 0.9 % IV SOLN
0.9 % | INTRAVENOUS | Status: DC | PRN
Start: 2021-02-12 — End: 2021-02-12

## 2021-02-12 MED ORDER — DOBUTAMINE-DEXTROSE 2-5 MG/ML-% IV SOLN
2-5 | INTRAVENOUS | Status: AC
Start: 2021-02-12 — End: 2021-02-12

## 2021-02-12 MED ORDER — SODIUM BICARBONATE 8.4 % IV SOLN
8.4 % | INTRAVENOUS | Status: AC
Start: 2021-02-12 — End: 2021-02-12
  Administered 2021-02-12: 19:00:00 100 via INTRAVENOUS

## 2021-02-12 MED ORDER — SODIUM BICARBONATE 8.4 % IV SOLN
8.4 % | Freq: Once | INTRAVENOUS | Status: AC
Start: 2021-02-12 — End: 2021-02-12

## 2021-02-12 MED ORDER — NORMAL SALINE FLUSH 0.9 % IV SOLN
0.9 % | INTRAVENOUS | Status: DC | PRN
Start: 2021-02-12 — End: 2021-02-24

## 2021-02-12 MED ORDER — SODIUM CHLORIDE 0.9 % IV SOLN
0.9 | INTRAVENOUS | Status: DC | PRN
Start: 2021-02-12 — End: 2021-02-24

## 2021-02-12 MED ORDER — PROPOFOL 200 MG/20ML IV EMUL
20020 MG/20ML | INTRAVENOUS | Status: DC | PRN
Start: 2021-02-12 — End: 2021-02-12
  Administered 2021-02-12: 16:00:00 100 via INTRAVENOUS

## 2021-02-12 MED ORDER — ROCURONIUM BROMIDE 50 MG/5ML IV SOLN
50 MG/5ML | INTRAVENOUS | Status: DC | PRN
Start: 2021-02-12 — End: 2021-02-12
  Administered 2021-02-12: 17:00:00 40 via INTRAVENOUS
  Administered 2021-02-12: 16:00:00 5 via INTRAVENOUS
  Administered 2021-02-12: 17:00:00 50 via INTRAVENOUS

## 2021-02-12 MED ORDER — EPINEPHRINE 30 MG/30ML IJ SOLN
30 MG/ML | INTRAMUSCULAR | Status: DC | PRN
Start: 2021-02-12 — End: 2021-02-12
  Administered 2021-02-12 (×2): 10 via INTRAVENOUS

## 2021-02-12 MED FILL — DEXMEDETOMIDINE HCL 200 MCG/2ML IV SOLN: 200 MCG/2ML | INTRAVENOUS | Qty: 400

## 2021-02-12 MED FILL — ELIQUIS 5 MG PO TABS: 5 mg | ORAL | Qty: 1

## 2021-02-12 MED FILL — METOPROLOL TARTRATE 25 MG PO TABS: 25 mg | ORAL | Qty: 1

## 2021-02-12 MED FILL — SPIRONOLACTONE 50 MG PO TABS: 50 mg | ORAL | Qty: 1

## 2021-02-12 MED FILL — MELATONIN 5 MG PO TBDP: 5 mg | ORAL | Qty: 1

## 2021-02-12 MED FILL — HYDROXYZINE HCL 10 MG PO TABS: 10 mg | ORAL | Qty: 1

## 2021-02-12 MED FILL — ONDANSETRON HCL 4 MG/2ML IJ SOLN: 4 MG/2ML | INTRAMUSCULAR | Qty: 2

## 2021-02-12 MED FILL — HYDROXYCHLOROQUINE SULFATE 200 MG PO TABS: 200 mg | ORAL | Qty: 2

## 2021-02-12 MED FILL — KETAMINE HCL 50 MG/ML IJ SOLN: 50 mg/mL | INTRAMUSCULAR | Qty: 10

## 2021-02-12 MED FILL — MIDAZOLAM HCL 2 MG/2ML IJ SOLN: 2 mg/mL | INTRAMUSCULAR | Qty: 2

## 2021-02-12 MED FILL — SODIUM BICARBONATE 8.4 % IV SOLN: 8.4 % | INTRAVENOUS | Qty: 50

## 2021-02-12 MED FILL — DULOXETINE HCL 60 MG PO CPEP: 60 mg | ORAL | Qty: 1

## 2021-02-12 MED FILL — DOBUTAMINE-DEXTROSE 2-5 MG/ML-% IV SOLN: 2-5 MG/ML-% | INTRAVENOUS | Qty: 250

## 2021-02-12 MED FILL — DEXMEDETOMIDINE HCL 200 MCG/2ML IV SOLN: 200 MCG/2ML | INTRAVENOUS | Qty: 4

## 2021-02-12 NOTE — Procedures (Signed)
Molly [name] is a 60 y.o. female patient.  1. Shortness of breath    2. Congestive heart failure, unspecified HF chronicity, unspecified heart failure type (HCC)    3. Acute on chronic combined systolic and diastolic congestive heart failure (HCC)      Past Medical History:   Diagnosis Date   ??? Amenorrhea    ??? Cancer (HCC)    ??? History of non-Hodgkin's lymphoma 2006    treated with radiation and chemo   ??? Obesity    ??? Systemic lupus     followed by Dr. Harley Hallmark     Blood pressure (!) 95/57, pulse 87, temperature 98.1 ??F (36.7 ??C), temperature source Temporal, resp. rate 25, height 5\' 11"  (1.803 m), weight (!) 308 lb (139.7 kg), SpO2 99 %, not currently breastfeeding.    Central Line    Date/Time: 02/12/2021 9:52 PM  Performed by: 02/14/2021, MD  Authorized by: Sondra Come, MD   Consent: Verbal consent obtained.  Risks and benefits: risks, benefits and alternatives were discussed  Consent given by: spouse  Procedure consent: procedure consent matches procedure scheduled  Site marked: the operative site was marked  Imaging studies: imaging studies available  Required items: required blood products, implants, devices, and special equipment available  Patient identity confirmed: arm band  Time out: Immediately prior to procedure a "time out" was called to verify the correct patient, procedure, equipment, support staff and site/side marked as required.  Indications: vascular access  Anesthesia: local infiltration    Anesthesia:  Local Anesthetic: lidocaine 2% without epinephrine    Sedation:  Patient sedated: yes  Sedatives: propofol  Vitals: Vital signs were monitored during sedation.    Preparation: skin prepped with 2% chlorhexidine  Skin prep agent dried: skin prep agent completely dried prior to procedure  Sterile barriers: all five maximum sterile barriers used - cap, mask, sterile gown, sterile gloves, and large sterile sheet  Hand hygiene: hand hygiene performed prior to central venous catheter  insertion  Location details: right internal jugular  Patient position: Trendelenburg  Catheter type: triple lumen  Catheter size: 6 Fr  Ultrasound guidance: yes  Sterile ultrasound techniques: sterile gel and sterile probe covers were used  Number of attempts: 1  Successful placement: yes  Post-procedure: line sutured and dressing applied  Assessment: blood return through all ports,  free fluid flow and placement verified by x-ray  Patient tolerance: patient tolerated the procedure well with no immediate complications  Comments: EBL less than 5cc.          Sondra Come, MD  02/12/2021

## 2021-02-12 NOTE — Plan of Care (Signed)
Patient came to the lab, saturating 100%, on room air, comfortable at rest  Symptoms she laid on the table, she started having shortness of breath and have more abdominal form of breathing  Her saturation was down to 65%; lungs are clear at this time; blood pressure stable  Anesthesia MD evaluated the patient, and recommended elective intubation for the procedure  Total procedure time was about 20 minutes -patient is back in sinus rhythm  With the plan to extubate her, sedation was weaned.  This juncture, she got more agitated and started fighting against her endotracheal tube  By this time, her saturation was about 65 to 70%.  Chest examination revealed extensive crepitations; Lasix 40 mg IV was given and a Foley's catheter was placed  Her blood pressure was within normal limits at this time.    Anesthesia MD reevaluated her and decided to paralyze her secondary to ongoing fighting against endotracheal tube  After this, her saturation got better  By this time, we were not able to record a blood pressure; however, she have a bouncing carotid pulse.  In spite of checking in multiple limbs, we were not able to obtain a good blood pressure  Hence, she was initiated on vasopressors; stat echocardiogram was performed in the lab and did not show any evidence of pericardial effusion; LV ejection fraction remains low at about 15 to 20%    Anesthesia team attempted radial artery catheterization  Eventually, I got a right femoral arterial access and her blood pressure was 150/90 mmHg  Hence, her vasopressors were discontinued.  Blood pressure remained stable    Patient has been transferred to the ICU    Please be aware that she received extensive sedation in the setting of low ejection fraction.  Hence, her elimination time of these medications would be prolonged.  Hence, we may have to wait for a longer duration for recovery from sedatives.    Conclusion:  1.  Atrial flutter ablation  2.  No procedural complications  3.   Difficulty in extubating the patient (more agitation after attempted weaning from sedation, and hence requiring every sedation and paralytic agents)  4.  She will need aggressive diuresis and bronchodilators to improve her respiratory status before extubation      Discussed with her sister-in-law, multiple times    Discussed with ICU team    Guadelupe Sabin MD   Cardiac Electrophysiology  [facility] [place]  Office [phone]

## 2021-02-12 NOTE — Progress Notes (Signed)
Progress Note    Admit Date: 02/09/2021  Day: 4  Diet: Diet NPO    CC: SOB.    Interval history: Patient seen at bedside, she is feeling much better this morning in terms of her anxiety. Was able to get some sleep. She was seen by EP this morning and had ablation procedure explained to her, feels slightly anxious however endorses she is ready for procedure. She denies any issues overnihgt including SOB, chest pain, palpitations, fever/chills, n/v, abdominal pain and acute urinary or bowel abnormalities.     Medications:     Scheduled Meds:  ??? apixaban  5 mg Oral BID   ??? spironolactone  50 mg Oral Daily   ??? metoprolol tartrate  25 mg Oral BID   ??? melatonin  5 mg Oral Nightly   ??? DULoxetine  60 mg Oral Daily   ??? hydroxychloroquine  400 mg Oral Daily   ??? sodium chloride flush  5-40 mL IntraVENous 2 times per day     Continuous Infusions:  ??? sodium chloride     ??? [Held by provider] furosemide Stopped (02/10/21 2334)     PRN Meds:heparin (porcine), heparin (porcine), hydrOXYzine HCl, albuterol, sodium chloride flush, sodium chloride, ondansetron **OR** ondansetron, polyethylene glycol, acetaminophen **OR** acetaminophen    Objective:   Vitals:   T-max:  Patient Vitals for the past 8 hrs:   BP Temp Temp src Pulse Resp SpO2 Weight   02/12/21 0857 -- -- -- 93 -- -- --   02/12/21 0856 104/75 98.4 ??F (36.9 ??C) Oral 93 18 100 % --   02/12/21 0509 -- -- -- -- -- -- (!) 308 lb (139.7 kg)   02/12/21 0406 108/72 97.4 ??F (36.3 ??C) Oral 97 18 93 % --       Intake/Output Summary (Last 24 hours) at 02/12/2021 1025  Last data filed at 02/11/2021 2322  Gross per 24 hour   Intake 600 ml   Output 500 ml   Net 100 ml       Review of Systems  Per Interval hx.    Physical Exam  HENT:      Mouth/Throat:      Mouth: Mucous membranes are moist.      Pharynx: Oropharynx is clear.   Eyes:      Extraocular Movements: Extraocular movements intact.      Conjunctiva/sclera: Conjunctivae normal.   Cardiovascular:      Rate and Rhythm: Normal rate and  regular rhythm.      Pulses: Normal pulses.      Heart sounds: Normal heart sounds.   Pulmonary:      Effort: Pulmonary effort is normal.      Breath sounds: Rales present.      Comments: Minimal rales at bases.  Abdominal:      General: There is distension.   Musculoskeletal:      Right lower leg: Edema present.      Left lower leg: Edema present.      Comments: 2+ edema b/l LE, L slightly worse than R.   Skin:     Capillary Refill: Capillary refill takes less than 2 seconds.   Neurological:      General: No focal deficit present.      Mental Status: She is alert and oriented to person, place, and time. Mental status is at baseline.      Comments: No tremors appreciated.        LABS:    CBC:   Recent Labs  02/10/21  1037 02/11/21  0130 02/12/21  0534   WBC 8.5 11.1* 12.3*   HGB 13.0 13.1 13.6   HCT 39.6 40.4 42.0   PLT 263 224 250   MCV 90.7 92.2 91.3     Renal:    Recent Labs     02/10/21  1431 02/10/21  1431 02/10/21  2221 02/10/21  2357 02/10/21  2359 02/11/21  0130 02/11/21  0656 02/11/21  2100 02/12/21  0534   NA 130*   < > 129*  --   --   --  132* 128*  --    K 3.5   < > 6.6*   < > 4.3  --  4.4 4.2  --    CL 91*   < > 89*  --   --   --  90* 87*  --    CO2 25   < > 19*  --   --   --  27 25  --    BUN 13   < > 16  --   --   --  20 26*  --    CREATININE 1.0   < > 1.2*  --   --   --  1.4* 1.5*  --    GLUCOSE 113*   < > 132*  --   --   --  107* 147*  --    CALCIUM 9.2   < > 9.3  --   --   --  8.9 9.0  --    MG  --   --   --    < >  --  1.90 1.80  --  2.10   PHOS 3.7  --   --   --   --   --   --   --   --    ANIONGAP 14   < > 21*  --   --   --  15 16  --     < > = values in this interval not displayed.     Hepatic:   Recent Labs     02/10/21  1431   LABALBU 4.3     Troponin:   Recent Labs     02/09/21  1840 02/10/21  2357   TROPONINI <0.01 <0.01     BNP: No results for input(s): BNP in the last 72 hours.  Lipids:   Recent Labs     02/10/21  0522   CHOL 121   HDL 33*     ABGs:  No results for input(s): PHART,  PCO2ART, PO2ART, HCO3ART, BEART, THGBART, O2SATART, TCO2ART in the last 72 hours.    INR:   Recent Labs     02/10/21  1037   INR 1.27*     Lactate: No results for input(s): LACTATE in the last 72 hours.  Cultures:  -----------------------------------------------------------------  RAD:   CT CHEST PULMONARY EMBOLISM W CONTRAST   Final Result      1. No evidence of any pulmonary embolism.   2. Four-chamber cardiac enlargement with reflux of contrast into the hepatic veins. This can be seen with elevated right heart pressures.   3. No acute pulmonary process.      XR CHEST PORTABLE   Final Result      Stable cardiac enlargement      No acute consolidation                Assessment/Plan:     59 yo female  with a PMH of NHL, SLE who presented to the ED with chief complaints of cough and shortness of breath for the past 6 weeks that got worse recently in the past few days  ??  Acute Exacerbation of New-Onset HFrEF   Recent ECHO on 6/17 with severe dilation of LV, EF of <20%, severe diffuse hypokinesis and grade 3 diastolic dysfunction. Also mildly elevated PAP.  ProBNP 4707, down from ~7k 01/29/21. S/p 60 mg IV Lasix in ED.   >>>Unclear etiology, differentials are broad at this point. Definitely need to rule out ischemia with stress testing/cath when she is more optimized. Consider dilated cardiomyopathy 2/2 delayed Anthracycline toxicity though this would typically present at least with 2-3 years of chemo tx. Also consider viral cardiomyopathy considering recent URI symptoms she reports.   - Lasix gtt; holding at this time 2/2 AKI  - continue Toprol XL ER 25 mg PO daily  - continue Aldactone 50 mg daily   - requires ischemic w/u, consider when euvolemic   - Cards c/s - appreciate reccs     New-Onset Atrial Flutter  Patient endorses SOB however no chest pain or palpitations. She states she feels anxious. Tachycardic on exam.   Arrived to ED in Atrial Flutter based on EKG obtained at admission. Given Metoprolol 2.5 mg once on  admission.    - plan for ablation today w/ EP  - continue Toprol XL ER 25 mg PO daily  - Cards and EP c/s - reccs appreciated    AKI   Was on Lasix drip at 10/h for majority of day yesterday. Baseline Cr and on arrival wnl. Cr uptrended to 1.4 on this mornings labs. Likely 2/2 diuresis w/ Lasix gtt.   - holding Lasix drip at this time  - continue to monitor with RFP BID     Anxiety  Patient denies any history of. Appears very anxious this admission. Consider related to Atrial flutter and its ensuing symptoms.  - continue Atarax 10 mg TID PRN  - can spot dose Ativan 0.5 mg for uncontrolled episodes     Chronic Medical Conditions:  SLE - continue Plaquenil 400 mg PO daily, Cymbalta 60 mg PO daily   OA - holding Relafen       Code Status: FULL CODE  FEN: Adult Diet; Regular   PPX: Lovenox  DISPO: GMF     Margarita Sermons, MD, PGY-1  02/12/21  10:25 AM    This patient has been staffed and discussed with Carlis Stable, MD.

## 2021-02-12 NOTE — Progress Notes (Signed)
EMR was reviewed.  Patient remains off supplemental oxygen.  I's and O's +900.  Lasix drip is held.  Patient is n.p.o. for atrial flutter ablation later today.  Creatinine up at 1.5 from 1.4.  There were no overnight events.          Assessment / Plan:   ??  1.  Acute and new HFrEF.  It is of unknown etiology, could be ischemic (severe CAD) versus nonischemic (PAF RVR, less likely postchemotherapy, viral).  Patient is volume overloaded but hemodynamically stable.  2.  Non-Hodgkin's lymphoma status post chemotherapy, now in remission  3.  SLE  4.  Rheumatoid arthritis  5.  PAF RVR.  Patient appears to be in atrial flutter/coarse atrial fibrillation with RVR.  It is of unknown duration and she has not been on any anticoagulation.  She was minimally symptomatic (minimal palpitations).  ??  ??  ??  - Plan for atrial flutter ablation later today  - If okay with EP, continue with Lopressor 25 mg twice daily and Eliquis  - Strict I's and O's every shift and standing weights if possible, low-salt diet, daily BMP with reflex to Mg (correct lytes for goals K >4.0 and Mg > 2.0) and wean supplemental oxygen to off (or down to baseline supplemental oxygen requirements) for sats greater than 92-94%.  - We will consider LHC once patient euvolemic to rule out significant CAD.  ??

## 2021-02-12 NOTE — Anesthesia Post-Procedure Evaluation (Signed)
Department of Anesthesiology  Postprocedure Note    Patient: Molly [name]  MRN: [id number]  Birthdate: 12-26-1960  Date of evaluation: 02/12/2021      Procedure Summary     Date: 02/12/21 Room / Location: [facility] Cath Lab    Anesthesia Start: 5456 Anesthesia Stop: 2563    Procedure: [place] A-FLUTTER ABL W ANES Diagnosis:     Scheduled Providers: Sherrilee Gilles, DO Responsible Provider: Sherrilee Gilles, DO    Anesthesia Type: general, MAC ASA Status: 3          Anesthesia Type: No value filed.    Aldrete Phase I:      Aldrete Phase II:        Anesthesia Post Evaluation    Patient location during evaluation: bedside  Patient participation: complete - patient cannot participate  Level of consciousness: awake and alert and sedated and ventilated  Airway patency: patent  Nausea & Vomiting: no nausea and no vomiting  Cardiovascular status: blood pressure returned to baseline  Respiratory status: acceptable  Hydration status: euvolemic  Comments: Pt unable to be extubated due to low SpO2 and tachypnea. Pt administered 40 mg lasix, levophed administered for BP support. Will likely need aggressive diuresis prior to extubation.

## 2021-02-12 NOTE — Progress Notes (Signed)
CVC placed and tolerated well; BP stabilized and blood glucose stabilized. Will continue to monitor.

## 2021-02-12 NOTE — Progress Notes (Signed)
Patient brought up from ablation procedure to 4516 still intubated and placed on mechanical ventilator. Patient is unresponsive and still has paralytic on board from recent administration. VSS. No urine output from Foley after 40mg  of Lasix was given before being brought to this room. Right femoral arterial sheath in place and being used for BP monitoring. Patient's lips, ears and nose are purple/blue. Pulse ox is 44% upon getting a good waveform. Staff assist called. Respiratory therapist using ambu bag with peep pressure valve to attempt to oxygenate patient. See Dr. note.

## 2021-02-12 NOTE — Progress Notes (Signed)
Electrophysiology - PROGRESS NOTE    Admit Date: 02/09/2021     Chief Complaint: AFL     Interval History:   Patient seen and examined and notes reviewed. Patient is being followed for AFL. Remains AFL on tele, rates fairly well controlled, intermittently elevated. No major complaints this morning, has some questions regarding the procedure.    In: 1060 [P.O.:1060]  Out: 500    Wt Readings from Last 2 Encounters:   02/12/21 (!) 308 lb (139.7 kg)   02/01/21 (!) 336 lb (152.4 kg)         Data:   Scheduled Meds:   Scheduled Meds:  ??? apixaban  5 mg Oral BID   ??? spironolactone  50 mg Oral Daily   ??? metoprolol tartrate  25 mg Oral BID   ??? melatonin  5 mg Oral Nightly   ??? DULoxetine  60 mg Oral Daily   ??? hydroxychloroquine  400 mg Oral Daily   ??? sodium chloride flush  5-40 mL IntraVENous 2 times per day     Continuous Infusions:  ??? sodium chloride     ??? [Held by provider] furosemide Stopped (02/10/21 2334)     PRN Meds:.heparin (porcine), heparin (porcine), hydrOXYzine HCl, albuterol, sodium chloride flush, sodium chloride, ondansetron **OR** ondansetron, polyethylene glycol, acetaminophen **OR** acetaminophen  Continuous Infusions:  ??? sodium chloride     ??? [Held by provider] furosemide Stopped (02/10/21 2334)       Intake/Output Summary (Last 24 hours) at 02/12/2021 0914  Last data filed at 02/11/2021 2322  Gross per 24 hour   Intake 960 ml   Output 500 ml   Net 460 ml       CBC:   Lab Results   Component Value Date    WBC 12.3 02/12/2021    HGB 13.6 02/12/2021    PLT 250 02/12/2021     BMP:  Lab Results   Component Value Date    NA 128 02/11/2021    K 4.2 02/11/2021    K 3.7 02/09/2021    CL 87 02/11/2021    CO2 25 02/11/2021    BUN 26 02/11/2021    CREATININE 1.5 02/11/2021    GLUCOSE 147 02/11/2021     INR:   Lab Results   Component Value Date    INR 1.27 02/10/2021    INR 1.04 10/25/2016    INR 1.07 09/20/2016        CARDIAC LABS  ENZYMES:  Recent Labs      02/09/21  1840 02/10/21  2357   TROPONINI <0.01 <0.01     FASTING LIPID PANEL:  Lab Results   Component Value Date    HDL 33 02/10/2021    LDLCALC 72 02/10/2021    TRIG 82 02/10/2021     LIVER PROFILE:  Lab Results   Component Value Date    AST 61 01/29/2021    AST 19 04/19/2017    ALT 78 01/29/2021    ALT 10 04/19/2017       -----------------------------------------------------------------  Telemetry: Personally reviewed  AFL, rates fairly well controlled    Objective:   Vitals: BP 104/75    Pulse 93    Temp 98.4 ??F (36.9 ??C) (Oral)    Resp 18    Ht 5\' 11"  (1.803 m)    Wt (!) 308 lb (139.7 kg)    SpO2 100%    BMI 42.96 kg/m??   General appearance: alert, appears stated age and cooperative, No acute distress  Skin: Skin color, texture, turgor normal. No rashes or ecchymosis.  Neck: no JVD, supple, symmetrical, trachea midline   Lungs: , no accessory muscle use, no respiratory distress  Heart: RRR  Extremities: No edema, DP +  Psychiatric: normal insight and affect    Patient Active Problem List:     History of non-Hodgkin's lymphoma     Impaired glucose tolerance     HLD (hyperlipidemia)     Vitamin D deficiency     Morbid obesity with BMI of 40.0-44.9, adult (HCC)     Systemic lupus erythematosus (HCC)     High risk medication use     Status post total left knee replacement     Seasonal allergies     Acute bacterial sinusitis     SOB (shortness of breath)     Acute on chronic congestive heart failure (HCC)     CHF (congestive heart failure), NYHA class I, acute on chronic, combined (HCC)        Assessment & Plan:    1. AFK/RVR  2. CMP  3. Acute HFrEF    Molly [name] Is a 60 y.o. woman w/ PMH non-hodgkins lymphoma, s/p radiation, lupus, COVID who p/w SOB, Echo showed newly reduced EF 20%, found to be in AFL with poorly controlled ventricular rate    AFL/RVR  - In AFL, rates fairly well controlled  - CHADSVASc at least 2  - On Eliquis 5 mg BID  - On lopressor 25 mg BID  - Plan for RFA of AFL today, discussed  procedure further with patient at bedside  - Reviewed post procedure instructions with patient at bedside, also placed in chart  - Will arrange for follow up appts    New CMP/ acute HFrEF  - EF 20%  - On lopressor 25 mg BID- change to long acting on DC  - On spironolactone 50 mg QD  - On lasix gtt- on hold  - Keep K >4.0, Mg >2.0  - ischemic eval after in sinus rhythm  - Dr. Harrison Mons following      Electronically signed by Everlene Farrier, APRN - CNP on 02/12/21 at 9:14 AM EDT    [facility]    I spent a total of 45 minutes and greater than 50% of the time was spent counseling with Molly [name] and coordinating care regarding her diagnosis, treatments and plan of care.

## 2021-02-12 NOTE — Care Coordination-Inpatient (Signed)
Case Management Assessment           Daily Note                 Date/ Time of Note: 02/12/2021 3:48 PM         Note completed by: Marjie Skiff, RN    Patient Name: Molly [name]  Date of Birth: Jan 11, 1961    Diagnosis:Shortness of breath [R06.02]  CHF (congestive heart failure), NYHA class I, acute on chronic, combined (HCC) [I50.43]  Congestive heart failure, unspecified HF chronicity, unspecified heart failure type (HCC) [I50.9]  Patient Admission Status: Inpatient    Date of Admission:02/09/2021  6:11 PM Length of Stay: 3 GLOS: GMLOS: 3    Current Plan of Care: s/p ablation, unable to extubate, lasix gtt  ________________________________________________________________________________________  PT AM-PAC:   / 24 per last evaluation on: n/a    OT AM-PAC:   / 24 per last evaluation on: n/a    DME Needs for discharge: n/a  ________________________________________________________________________________________  Discharge Plan: Home    Tentative discharge date: tbd    Current barriers to discharge: medical complications      ________________________________________________________________________________________  Case Management Notes:  Patient is from home with spouse, completely independent pta.  CM will continue to follow for discharge needs.    Molly [name] and her family were provided with choice of provider; she and her family are in agreement with the discharge plan.    Care Transition Patient: No    Marjie Skiff, RN  [facility]  Case Management Department  Ph: [phone]  Fax: [phone]

## 2021-02-12 NOTE — Progress Notes (Signed)
PO2 is 551 on FiO2 and PEEP +10. FIO2 titrated to 60 and PEEP titrated to +8.

## 2021-02-12 NOTE — Progress Notes (Addendum)
ICU team was called to the patient's room as the patient's MAP had dropped to 55.  She also appeared to be more lethargic and drowsy from prior based on report.  At this time my concern was worsening heart failure as the patient was neither getting diuresed and was not on inotropic support.      The patient has also not had much urine output since the start of the shift.    I am unable to give her fluid boluses due to her severe heart failure.    Given her clinical status we placed a central line as she was requiring Levophed from a peripheral IV to keep her blood pressure up.  I also started her on milrinone. Given her history of A. fib/a flutter I am concerned that dobutamine may cause her to develop more arrhythmias.    My concern with milrinone is hypotension and its onset of action but we  will have a central access with which we can maintain her blood pressure while the milrinone takes effect.      I would also like to diurese this patient as I am concerned the primary etiology for all of this is heart failure however given her acute hypotension and the need for pressors and inotropic support I will wait  on starting diuresis until we can get her a bit more stable and off the vasopressors at least.      During the procedure as her blood pressures started to increase her mental status also began to improve as she became more alert and responsive.    Will continue to monitor.      The daughter was notified of these changes and consent was received for a central line.

## 2021-02-12 NOTE — Anesthesia Pre-Procedure Evaluation (Signed)
Department of Anesthesiology  Preprocedure Note       Name:  Molly [name]   Age:  60 y.o.  DOB:  01/11/1961                                          MRN:  [id number]         Date:  02/12/2021      Surgeon: * No surgeons listed *    Procedure: * No procedures listed *    Medications prior to admission:   Prior to Admission medications    Medication Sig Start Date End Date Taking? Authorizing Provider   Multiple Vitamins-Minerals (EMERGEN-C IMMUNE PLUS PO) Take 1 packet by mouth daily   Yes Historical Provider, MD   sacubitril-valsartan (ENTRESTO) 24-26 MG per tablet Take 1 tablet by mouth 2 times daily  Patient not taking: Reported on 02/09/2021 02/08/21   Fredrik Rigger, APRN - CNP   furosemide (LASIX) 40 MG tablet Take 1 tablet by mouth daily 02/01/21   Fredrik Rigger, APRN - CNP   spironolactone (ALDACTONE) 25 MG tablet Take 1 tablet by mouth daily 02/01/21   Fredrik Rigger, APRN - CNP   albuterol sulfate HFA (VENTOLIN HFA) 108 (90 Base) MCG/ACT inhaler Inhale 2 puffs into the lungs 4 times daily as needed for Wheezing 12/30/20   Fredrik Rigger, APRN - CNP   hydroxychloroquine (PLAQUENIL) 200 MG tablet Take 400 mg by mouth daily 07/25/17   Historical Provider, MD   DULoxetine (CYMBALTA) 60 MG extended release capsule Take 1 capsule by mouth daily 04/18/18 02/01/21  Royston Sinner, MD   nabumetone (RELAFEN) 500 MG tablet Take 1 tablet by mouth 2 times daily 02/19/18 02/01/21  Royston Sinner, MD   vitamin D (CHOLECALCIFEROL) 1000 UNIT TABS tablet Take 1,000 Units by mouth daily Twice a day   Patient not taking: Reported on 02/09/2021    Historical Provider, MD       Current medications:    Current Facility-Administered Medications   Medication Dose Route Frequency Provider Last Rate Last Admin   ??? apixaban (ELIQUIS) tablet 5 mg  5 mg Oral BID Rod Mae, MD   5 mg at 02/11/21 2120   ??? spironolactone (ALDACTONE) tablet 50 mg  50 mg Oral Daily Hervey Ard, MD   50 mg at 02/12/21 0900   ???  heparin (porcine) injection 10,000 Units  10,000 Units IntraVENous PRN Hervey Ard, MD       ??? heparin (porcine) injection 5,000 Units  5,000 Units IntraVENous PRN Hervey Ard, MD       ??? metoprolol tartrate (LOPRESSOR) tablet 25 mg  25 mg Oral BID Hervey Ard, MD   25 mg at 02/12/21 0900   ??? melatonin disintegrating tablet 5 mg  5 mg Oral Nightly Billy Fischer, MD   5 mg at 02/11/21 2120   ??? hydrOXYzine HCl (ATARAX) tablet 10 mg  10 mg Oral TID PRN Jason Fila, MD   10 mg at 02/11/21 2120   ??? albuterol (PROVENTIL) nebulizer solution 2.5 mg  2.5 mg Nebulization Q4H PRN Jason Fila, MD       ??? DULoxetine (CYMBALTA) extended release capsule 60 mg  60 mg Oral Daily Jason Fila, MD   60 mg at 02/11/21 2120   ??? hydroxychloroquine (PLAQUENIL) tablet 400 mg  400 mg Oral Daily Jason Fila,  MD   400 mg at 02/11/21 2120   ??? sodium chloride flush 0.9 % injection 5-40 mL  5-40 mL IntraVENous 2 times per day Jason Fila, MD   10 mL at 02/11/21 2135   ??? sodium chloride flush 0.9 % injection 5-40 mL  5-40 mL IntraVENous PRN Jason Fila, MD   10 mL at 02/11/21 0647   ??? 0.9 % sodium chloride infusion   IntraVENous PRN Jason Fila, MD       ??? ondansetron (ZOFRAN-ODT) disintegrating tablet 4 mg  4 mg Oral Q8H PRN Jason Fila, MD        Or   ??? ondansetron (ZOFRAN) injection 4 mg  4 mg IntraVENous Q6H PRN Jason Fila, MD       ??? polyethylene glycol (GLYCOLAX) packet 17 g  17 g Oral Daily PRN Jason Fila, MD       ??? acetaminophen (TYLENOL) tablet 650 mg  650 mg Oral Q6H PRN Jason Fila, MD        Or   ??? acetaminophen (TYLENOL) suppository 650 mg  650 mg Rectal Q6H PRN Jason Fila, MD       ??? [Held by provider] furosemide (LASIX) 100 mg in dextrose 5 % 100 mL infusion  10 mg/hr IntraVENous Continuous Jason Fila, MD   Paused at 02/10/21 2334       Allergies:    Allergies   Allergen Reactions   ??? Sulfa Antibiotics Rash       Problem List:    Patient Active Problem List   Diagnosis Code   ??? History of non-Hodgkin's  lymphoma Z85.72   ??? Impaired glucose tolerance R73.02   ??? HLD (hyperlipidemia) E78.5   ??? Vitamin D deficiency E55.9   ??? Morbid obesity with BMI of 40.0-44.9, adult (HCC) E66.01, Z68.41   ??? Systemic lupus erythematosus ([place]) M32.9   ??? High risk medication use Z79.899   ??? 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 Seasonal allergies J30.2   ??? Acute bacterial sinusitis J01.90, B96.89   ??? Shortness of breath R06.02   ??? Acute on chronic congestive heart failure (HCC) I50.9   ??? CHF (congestive heart failure), NYHA class I, acute on chronic, combined (HCC) I50.43   ??? Atrial flutter with rapid ventricular response (HCC) I48.92   ??? Dilated cardiomyopathy (HCC) I42.0       Past Medical History:        Diagnosis Date   ??? Amenorrhea    ??? Cancer ([place])    ??? History of non-Hodgkin's lymphoma 2006    treated with radiation and chemo   ??? Obesity    ??? Systemic lupus     followed by Dr. Royston Sinner       Past Surgical History:        Procedure Laterality Date   ??? JOINT REPLACEMENT     ??? KNEE ARTHROPLASTY Left 10/25/2016    left total knee replacement using computer navigation ([place])   ??? KNEE SURGERY Left     ACL and meniscus repair   ??? LEEP      in 1990's       Social History:    Social History     Tobacco Use   ??? Smoking status: Former Smoker     Packs/day: 0.50     Years: 18.00     Pack years: 9.00     Types: Cigarettes     Quit date: 2008     Years since  quitting: 14.4   ??? Smokeless tobacco: Never Used   Substance Use Topics   ??? Alcohol use: Yes     Alcohol/week: 1.0 standard drink     Types: 1 Shots of liquor per week     Comment: 2x/week                                Counseling given: Not Answered      Vital Signs (Current):   Vitals:    02/12/21 0406 02/12/21 0509 02/12/21 0856 02/12/21 0857   BP: 108/72  104/75    Pulse: 97  93 93   Resp: 18  18    Temp: 97.4 ??F (36.3 ??C)  98.4 ??F (36.9 ??C)    TempSrc: Oral  Oral    SpO2: 93%  100%    Weight:  (!) 308 lb (139.7 kg)     Height:                                                   BP Readings from Last 3 Encounters:   02/12/21 104/75   02/01/21 126/84   01/29/21 128/86       NPO Status:                                                                                 BMI:   Wt Readings from Last 3 Encounters:   02/12/21 (!) 308 lb (139.7 kg)   02/01/21 (!) 336 lb (152.4 kg)   01/29/21 (!) 331 lb (150.1 kg)     Body mass index is 42.96 kg/m??.    CBC:   Lab Results   Component Value Date    WBC 12.3 02/12/2021    RBC 4.60 02/12/2021    HGB 13.6 02/12/2021    HCT 42.0 02/12/2021    MCV 91.3 02/12/2021    RDW 15.3 02/12/2021    PLT 250 02/12/2021       CMP:   Lab Results   Component Value Date    NA 128 02/11/2021    K 4.2 02/11/2021    K 3.7 02/09/2021    CL 87 02/11/2021    CO2 25 02/11/2021    BUN 26 02/11/2021    CREATININE 1.5 02/11/2021    GFRAA 43 02/11/2021    AGRATIO 1.2 01/29/2021    LABGLOM 36 02/11/2021    GLUCOSE 147 02/11/2021    PROT 7.3 01/29/2021    CALCIUM 9.0 02/11/2021    BILITOT 0.9 01/29/2021    ALKPHOS 197 01/29/2021    AST 61 01/29/2021    ALT 78 01/29/2021       POC Tests:   Recent Labs     02/10/21  2244   POCGLU 154*       Coags:   Lab Results   Component Value Date    PROTIME 15.8 02/10/2021    INR 1.27 02/10/2021    APTT 33.1 02/10/2021       HCG (  If Applicable): No results found for: PREGTESTUR, PREGSERUM, HCG, HCGQUANT     ABGs: No results found for: PHART, PO2ART, PCO2ART, HCO3ART, BEART, O2SATART     Type & Screen (If Applicable):  No results found for: LABABO, LABRH    Drug/Infectious Status (If Applicable):  No results found for: HIV, HEPCAB    COVID-19 Screening (If Applicable):   Lab Results   Component Value Date    COVID19 Not Detected 01/29/2021           Anesthesia Evaluation    Airway: Mallampati: II  TM distance: >3 FB   Neck ROM: full  Mouth opening: > = 3 FB   Dental: normal exam         Pulmonary: breath sounds clear to auscultation  (+) shortness of breath:      (-) asthma and not a current smoker (former, quit in  2008)                           Cardiovascular:  Exercise tolerance: poor (<4 METS),   (+) hypertension:, dysrhythmias (flutter x 3 months): atrial flutter, CHF: systolic, hyperlipidemia    (-) past MI, CAD and CABG/stent    ECG reviewed  Rhythm: regular  Rate: normal           Beta Blocker:  Not on Beta Blocker      ROS comment: Left ventricular cavity size is severely dilated.   Normal left ventricular wall thickness.   Ejection fraction is visually estimated to be <20%.   There is severe diffuse hypokinesis.   Global Longitiudinal Strain= -5.0%   Grade III diastolic dysfunction with elevated LV filling pressures.   Moderate thickening of leaflets of mitral valve.   moderate mitral regurgitation.   Mildly dilated right ventricle.   Right ventricular systolic function is severely reduced.   The tricuspid valve was not well visualized.   moderate tricuspid regurgitation.   The right atrium is moderately dilated.   IVC size is dilated (>2.1 cm) and collapses < 50% with respiration   consistent with elevated RA pressure (15 mmHg).   Estimated pulmonary artery systolic pressure is mildly elevated at 40 mmHg   assuming a right atrial pressure of 15 mmHg.     Neuro/Psych:   (+) depression/anxiety    (-) TIA and CVA           GI/Hepatic/Renal:   (+) renal disease (elevated cr): ARF, morbid obesity     (-) GERD       Endo/Other:    (+) malignancy/cancer (h/o nonHL 2006).    (-) diabetes mellitus, hypothyroidism, hyperthyroidism                ROS comment: SLE Abdominal:             Vascular:     - DVT and PE.      Other Findings:           Anesthesia Plan      general and MAC     ASA 3       Induction: intravenous.      Anesthetic plan and risks discussed with patient.      Plan discussed with CRNA.                    Adrian Prows, MD   02/12/2021

## 2021-02-12 NOTE — Progress Notes (Signed)
Patient is taken via bed to cath lab for cardiac ablation.

## 2021-02-12 NOTE — Procedures (Signed)
[facility]     Electrophysiology Procedure Note       Date of Procedure: 02/12/2021  Patient's Name: Molly [name]  Date of Birth: Aug 15, 1961   Medical Record Number: [id number]  Referring Physician: Lindell Spar, APRN - CNP   Procedure Performed by: Guadelupe Sabin, MD    Procedure performed:  ?? Comprehensive electrophysiological study  ?? Three-dimensional electroanatomic mapping of the right atrium  ?? Left atrial recording and mapping via coronary sinus   ?? Monitored anesthesia care  ?? Radiofrequency ablation of SVT, Cavotricuspid isthmus dependent right atrial flutter  ?? Estimated blood loss less than 20 cc      Indications for procedure: Right atrial flutter   Molly [name] 60 y.o. 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 Due to persistent right atrial flutter, the  decision was made to proceed with EPS and ablation of atrial flutter.    Details of procedure:  The risks, benefits and alternatives of the ablation procedure were discussed with the patient. The risks including, but not limited to, the risks of bleeding, infection, radiation exposure, injury to vascular, cardiac and surrounding structures (including pneumothorax), stroke, cardiac perforation, tamponade, need for emergent open heart surgery, need for pacemaker implantation, myocardial infarction and death were discussed in detail. The patient opted to proceed with the ablation. Written informed consent was signed and placed in the chart. The patient was brought to the electrophysiology lab in a fasting non-sedated state. The patient was prepped and draped in a sterile fashion. A timeout protocol was completed to identify the patient and the procedure being performed. Pre-sedation evaluation and airway assessment was completed (please see Pre-sedation Note dated today).     Patient came to the EP lab on room air, with saturation of 100%.   However, as well as she was placed on the table, she was more short of breath having abdominal breathing.  More than likely, her morbid obesity is not helping her breathing pattern.  Hence, anesthesia MD evaluated her and electively intubated her for airway protection.    The vascular access sites were locally anesthetized.  Using the modified Seldinger technique, the following introducer sheaths and catheters were placed:    RFV: One 8.5 Fr SRO sheath for 3.5 mm tip Thermocool SF D/F curve mapping and ablation catheter           One 9 Fr introducer sheath for SOUNDSTAR ICE catheter           One 7 Fr introducer sheaths for Biosense webster EZ steer D/F CS catheter.      Heparin bolus was given and ACT was maintained between 200 to 250 seconds throughout the procedure.    Intracardiac echocardiogram catheter was advanced in to RA and then advanced in to RV to evaluate any baseline pericardial effusion. Using [facility] ICE catheter and ablation catheter, points were taken in the right atrium and the tricuspid valve to build a right atrial and CTI geometry and activation map. An irrigated ablation cathter was advanced into position along the ventricular septal aspect of the cavotricuspid isthmus under 3-D mapping and ICE guidance.    The study was notable for baseline atrial flutter at an atrial cycle length of 72 ms. Entrainment was performed from the septal, mid and lateral cavotricuspid isthmus demonstrated concealed entrainment with a short return cycle length (< 73ms) consistent with this mapped location being a critical portion of the circuit. Entrainment  from distal coronary sinus ruled out the left sided atrial flutter. The diagnosis of cavotricuspid isthmus-dependent flutter was made. Radiofrequency lesions were delivered in a linear fashion until the tachycardia terminated. Additional lesions were delivered to the isthmus until bidirectional block was observed.  Complete bidirectional block was confirmed by  differential pacing.  Then we performed an electrophysiologic study and programmed stimulation by using our CS catheter in addition to our ablation catheter which was place into the right atrium and right ventricle sequentially. We also attempted to induce arrhythmia by programmed stimulation and burst pacing.     ?? Sinus cycle length: 724 msec  ?? PR interval: 186 msec  ?? QRS interval: 92 msec  ?? QT interval: 410 msec  ?? A-H interval: 110 msec   ?? H-V interval of 49 msec    The procedure was performed with ICE and 3D mapping system without using fluoroscopy.     Then catheters were removed.  Sheaths were removed and hemostasis achieved by Vascade MVP venous closure device.     Postprocedure, anesthesia have tough time in extubation.  As soon as her sedation was weaned off, she started getting more agitation and desaturated.  At this time, extensive crackles.  Hence, Lasix 40 mg IV was given.  Foley's catheter was placed.  She continues to have agitation and hence, anesthesia team decided to give her more paralytics to keep her intubated.    Patient will be transferred to the ICU for continuous care of her intubation status.  After aggressive diuresis and bronchodilators, we will plan to extubate her electively        Conclusion:  Pre- and post-procedural diagnoses were cavotricuspid isthmus dependent right atrial flutter with successful ablation of this atrial flutter.    Plan:   Secondary to difficulty extubating, she is being transferred to intensive care  Continue so diuresis with Lasix  Inhaled bronchodilators  Elective extubation    Discussed with family (sister-in-law, who is an [employer] fundraiser)    Thank you for allowing me to participate in the care of your patient. If you have any questions, please feel free to contact me.    Guadelupe Sabin MD   Cardiac Electrophysiology  [facility] [place]  Office [phone]  [employer]

## 2021-02-12 NOTE — Plan of Care (Signed)
Chest x-ray reviewed  Increased bronchopulmonary markings, prominent upper lobe veins  Restarted her on Lasix infusion at 10 mg/h    Guadelupe Sabin MD   Cardiac Electrophysiology  [facility] [place]  Office [phone]

## 2021-02-12 NOTE — Plan of Care (Signed)
Problem: Discharge Planning  Goal: Discharge to home or other facility with appropriate resources  Outcome: Progressing   Patient is scheduled to have ablation 6/24 to help correct her afib. Patients BP and vitals have been stable.   Problem: Respiratory - Adult  Goal: Achieves optimal ventilation and oxygenation  Outcome: Progressing  Patient is not using O2 she is on room air and sating in the upper 90's.     Problem: Safety - Adult  Goal: Free from fall injury  Outcome: Progressing  Flowsheets (Taken 02/11/2021 1446 by Benedetto Coons, RN)  Free From Fall Injury: Instruct family/caregiver on patient safety  Pt is a Fall Risk. See Lattie Corns Fall Risk Score. Pt bed in low position and side rails up. Call light and belongings in reach. Pt encouraged to call for assistance. Will continue with hourly rounds for PO intake, pain needs, toileting, and repositioning as needed.

## 2021-02-12 NOTE — Progress Notes (Signed)
Patient is now awake, agitated but following directions. Moving all extremities. VSS. Nodding head to nausea, Zofran given. Bilateral wrist restraints placed for patient safety after patient unable to stop reaching for ETT. Residents notified of assessment. New orders received for Precedex. Family updated at bedside.

## 2021-02-12 NOTE — Progress Notes (Signed)
4 Eyes Admission Assessment     I agree as the admission nurse that 2 RN's have performed a thorough Head to Toe Skin Assessment on the patient. ALL assessment sites listed below have been assessed on admission.       Areas assessed by both nurses:   [x]    Head, Face, and Ears   [x]    Shoulders, Back, and Chest  [x]    Arms, Elbows, and Hands   [x]    Coccyx, Sacrum, and Ischium  [x]    Legs, Feet, and Heels        Does the Patient have Skin Breakdown?  No         Braden Prevention initiated:  Yes   Wound Care Orders initiated:  NA      WOC nurse consulted for Pressure Injury (Stage 3,4, Unstageable, DTI, NWPT, and Complex wounds) or Braden score 18 or lower:  NA      Nurse 1 eSignature: Electronically signed by , RN on 02/12/21 at 6:46 PM EDT    **SHARE this note so that the co-signing nurse is able to place an eSignature**    Nurse 2 eSignature: Electronically signed by , RN on 02/12/21 at 6:47 PM EDT

## 2021-02-12 NOTE — Consults (Addendum)
ICU Consult Note        Hospital Day: 0  ICU Day:  0                                                        Code:Full Code  Admit Date: 02/09/2021  PCP: Fredrik Rigger, APRN - CNP                                  CC: SOB     HISTORY OF PRESENT ILLNESS:   Molly [name] is a 60 yo female with a PMH of non- hodgkin's lymphoma, Systemic lupus, rheumatoid arthritis who presented to the ED with chief complaints of cough and shortness of breath for the past 6 weeks that got worse recently in the past few days.   As per the patient about 6 weeks ago she was having shortness of breath while walking around with decrease in her levels of activity along with cough with white to clear sputum. It was also associated with weight gain of 46 pounds and lower extremity edema. Patient saw her PCP who recommended her to get an ECHO done, results of which came back last night showing an EF of 20%. Patient has been on lasix 40 mg daily and was started on enteresto and aldactone. She does state of having mild difficulty in lying flat in bed and has been using around 1 pillow which is her baseline. Patient was feeling worse this morning, with a cardiology appointment not until next week she decided to come to the hospital for further evaluation.   ??  On 6/24, patient was taken to cath lab for ablation for a flutter. While she lie down on table, she was having increasing respiratory distress and belly breathing, she then desatted and was intubated. She was also give a dose of IV lasix for HF. She was unable to come off the vent and transferred to ICU for medical and vent management.     PAST HISTORY:     Past Medical History:   Diagnosis Date   ??? Amenorrhea    ??? Cancer ([place])    ??? History of non-Hodgkin's lymphoma 2006    treated with radiation and chemo   ??? Obesity    ??? Systemic lupus     followed by Dr. Royston Sinner       Past Surgical History:   Procedure Laterality Date   ??? JOINT REPLACEMENT     ??? KNEE ARTHROPLASTY Left 10/25/2016     left total knee replacement using computer navigation ([place])   ??? KNEE SURGERY Left     ACL and meniscus repair   ??? LEEP      in 1990's       SocialHistory:   The patient lives at home with family.     Alcohol: 1 stand drink of alcohol per wek  Illicit drugs: denies   Tobacco:  Quit smoking 14 yrs ago, 9 pack-year smoking hx.    Family History:  Family History   Problem Relation Age of Onset   ??? Dementia Mother    ??? Thyroid Disease Mother    ??? Other Father         paraplegic after farming accident   ???  High Cholesterol Sister    ??? Cancer Sister 57        Breast CA   ??? Thyroid Disease Sister    ??? High Cholesterol Brother    ??? High Cholesterol Brother    ??? Rheum Arthritis Neg Hx    ??? Osteoarthritis Neg Hx    ??? Asthma Neg Hx    ??? Breast Cancer Neg Hx    ??? Diabetes Neg Hx    ??? Heart Failure Neg Hx    ??? Hypertension Neg Hx    ??? Migraines Neg Hx    ??? Ovarian Cancer Neg Hx    ??? Rashes/Skin Problems Neg Hx    ??? Seizures Neg Hx    ??? Stroke Neg Hx        MEDICATIONS:     No current facility-administered medications on file prior to encounter.     Current Outpatient Medications on File Prior to Encounter   Medication Sig Dispense Refill   ??? Multiple Vitamins-Minerals (EMERGEN-C IMMUNE PLUS PO) Take 1 packet by mouth daily     ??? sacubitril-valsartan (ENTRESTO) 24-26 MG per tablet Take 1 tablet by mouth 2 times daily (Patient not taking: Reported on 02/09/2021) 60 tablet 3   ??? furosemide (LASIX) 40 MG tablet Take 1 tablet by mouth daily 60 tablet 3   ??? spironolactone (ALDACTONE) 25 MG tablet Take 1 tablet by mouth daily 30 tablet 3   ??? albuterol sulfate HFA (VENTOLIN HFA) 108 (90 Base) MCG/ACT inhaler Inhale 2 puffs into the lungs 4 times daily as needed for Wheezing 54 g 1   ??? hydroxychloroquine (PLAQUENIL) 200 MG tablet Take 400 mg by mouth daily     ??? DULoxetine (CYMBALTA) 60 MG extended release capsule Take 1 capsule by mouth daily 30 capsule 5   ??? nabumetone (RELAFEN) 500 MG tablet Take 1 tablet by mouth 2 times daily 60  tablet 5   ??? vitamin D (CHOLECALCIFEROL) 1000 UNIT TABS tablet Take 1,000 Units by mouth daily Twice a day  (Patient not taking: Reported on 02/09/2021)           Scheduled Meds:  ??? sodium chloride flush  5-40 mL IntraVENous 2 times per day   ??? apixaban  5 mg Oral BID   ??? spironolactone  50 mg Oral Daily   ??? metoprolol tartrate  25 mg Oral BID   ??? melatonin  5 mg Oral Nightly   ??? DULoxetine  60 mg Oral Daily   ??? hydroxychloroquine  400 mg Oral Daily   ??? sodium chloride flush  5-40 mL IntraVENous 2 times per day      Continuous Infusions:  ??? sodium chloride     ??? sodium chloride     ??? [Held by provider] furosemide Stopped (02/10/21 2334)     PRN Meds:sodium chloride flush, sodium chloride, ondansetron, HYDROmorphone, HYDROmorphone, labetalol **OR** hydrALAZINE, prochlorperazine, oxyCODONE **OR** oxyCODONE, hydrOXYzine HCl, albuterol, sodium chloride flush, sodium chloride, ondansetron **OR** ondansetron, polyethylene glycol, acetaminophen **OR** acetaminophen    Allergies:   Allergies   Allergen Reactions   ??? Sulfa Antibiotics Rash       REVIEW OF SYSTEMS:       History obtained from EMR, cardiologist    Review of Systems   Unable to perform ROS: Intubated        PHYSICAL EXAM:       Vitals: BP 104/75    Pulse 93    Temp 98.4 ??F (36.9 ??C) (Oral)    Resp  18    Ht _0  (1.803 m)    Wt (!) 308 lb (139.7 kg)    SpO2 100%    BMI 42.96 kg/m??     I/O:      Intake/Output Summary (Last 24 hours) at 02/12/2021 1319  Last data filed at 02/12/2021 0857  Gross per 24 hour   Intake 360 ml   Output 400 ml   Net -40 ml     I/O this shift:  In: -   Out: 100 [Urine:100]  I/O last 3 completed shifts:  In: 1834.1 [P.O.:1280; I.V.:454.1; IV Piggyback:100]  Out: 1175 [Urine:1175]    Physical Examination:     Physical Exam  Constitutional:       Comments: Intubated and sedated    HENT:      Head: Normocephalic and atraumatic.      Right Ear: External ear normal.      Left Ear: External ear normal.      Nose: Nose normal.      Mouth/Throat:       Comments: ETT tube in place.   Cardiovascular:      Rate and Rhythm: Regular rhythm. Tachycardia present.      Heart sounds: Normal heart sounds.   Pulmonary:      Effort: Pulmonary effort is normal.      Breath sounds: Normal breath sounds.      Comments: Mild crackles in B/L bases    Abdominal:      General: Abdomen is flat. Bowel sounds are normal.      Palpations: Abdomen is soft.   Musculoskeletal:      Comments: B/L LE edema up to knees    Skin:     General: Skin is warm and dry.   Neurological:      Comments: Intubated and sedated          No results for input(s): PHART, PCO2ART, PO2ART in the last 72 hours.        DATA:       Labs:  CBC:   Recent Labs     02/10/21  1037 02/11/21  0130 02/12/21  0534   WBC 8.5 11.1* 12.3*   HGB 13.0 13.1 13.6   HCT 39.6 40.4 42.0   PLT 263 224 250       BMP:   Recent Labs     02/10/21  1431 02/10/21  2221 02/11/21  0656 02/11/21  2100 02/12/21  1024   NA 130*   < > 132* 128* 126*   K 3.5   < > 4.4 4.2 4.3   CL 91*   < > 90* 87* 86*   CO2 25   < > _1 BUN 13   < > 20 26* 34*   CREATININE 1.0   < > 1.4* 1.5* 1.6*   GLUCOSE 113*   < > 107* 147* 96   PHOS 3.7  --   --   --   --     < > = values in this interval not displayed.     LFT's: No results for input(s): AST, ALT, ALB, BILITOT, ALKPHOS in the last 72 hours.  Troponin:   Recent Labs     02/09/21  1840 02/10/21  2357   TROPONINI <0.01 <0.01     BNP:No results for input(s): BNP in the last 72 hours.  ABGs: No results for input(s): PHART, PCO2ART, PO2ART in the last 72 hours.  INR:   Recent  Labs     02/10/21  1037   INR 1.27*       U/A:  Recent Labs     02/09/21  1913   COLORU Yellow   PHUR 7.0   WBCUA 3-5   RBCUA 3-4   CLARITYU Clear   SPECGRAV 1.015   LEUKOCYTESUR TRACE*   UROBILINOGEN 0.2   BILIRUBINUR Negative   BLOODU Negative   GLUCOSEU Negative   AMORPHOUS 2+       CT CHEST PULMONARY EMBOLISM W CONTRAST   Final Result      1. No evidence of any pulmonary embolism.   2. Four-chamber cardiac enlargement with  reflux of contrast into the hepatic veins. This can be seen with elevated right heart pressures.   3. No acute pulmonary process.      XR CHEST PORTABLE   Final Result      Stable cardiac enlargement      No acute consolidation                  ASSESSMENT AND PLAN:     60 yo female with a PMH of NHL, SLE who presented to the ED with chief complaints of cough and shortness of breath for the past 6 weeks that got worse recently in the past few days    ARF 2/2 acute HFrEF s/p a flutter ablation   -mechanical ventilation   -Pressors: dobutamine, wean as tolerated   -will add sedation if needed  ??  Acute Exacerbation of New-Onset HFrEF   Recent ECHO on 6/17 with severe dilation of LV, EF of <20%, severe diffuse hypokinesis and grade 3 diastolic dysfunction. Also mildly elevated PAP.  ProBNP 4707, down from ~7k 01/29/21. S/p 60 mg IV Lasix in ED.   >>>Unclear etiology, differentials are broad at this point. Definitely need to rule out ischemia with stress testing/cath when she is more optimized. Consider dilated cardiomyopathy 2/2 delayed Anthracycline toxicity though this would typically present at least with 2-3 years of chemo tx. Also consider viral cardiomyopathy considering recent URI symptoms she reports.   - Lasix gtt; holding at this time 2/2 AKI  - continue Toprol XL ER 25 mg PO daily  - continue Aldactone 50 mg daily   - requires ischemic w/u, consider when euvolemic   - Cards c/s - appreciate reccs   ??  New-Onset Atrial Flutter s/p ablation   Patient endorses SOB however no chest pain or palpitations. She states she feels anxious. Tachycardic on exam.   Arrived to ED in Atrial Flutter based on EKG obtained at admission. Given Metoprolol 2.5 mg once on admission.    - ablation w/ EP 6/24  - continue Toprol XL ER 25 mg PO daily  - Cards and EP c/s - reccs appreciated  ??  AKI    Was on Lasix drip at 10/h for majority of day yesterday. Baseline Cr and on arrival wnl. Cr uptrended to 1.4 on this mornings labs. Likely  2/2 diuresis w/ Lasix gtt.   - holding Lasix drip at this time  - continue to monitor with RFP BID   ??  Anxiety  Patient denies any history of. Appears very anxious this admission. Consider related to Atrial flutter and its ensuing symptoms.  - continue Atarax 10 mg TID PRN  - can spot dose Ativan 0.5 mg for uncontrolled episodes   ??  Chronic Medical Conditions:  SLE - continue Plaquenil 400 mg PO daily, Cymbalta 60 mg PO daily  OA - holding Relafen   ??  Code Status: FULL CODE  FEN: NPO  PPX: Eliquis   DISPO: ICU      This patient has been staffed and discussed with Kathryne Hitch, MD.   -----------------------------  Troy Sine, DO, PGY-1  02/12/2021  1:19 PM    Patient seen, examined and discussed with the resident and I agree with the assessment and plan.  Briefly, this is a 60 y.o. female with new NICM, atrial flutter who had respiratory failure and hypotension following an ablation for atrial flutter.    Vent Mode: AC/PRVC Resp Rate (Set): 24 bmp/Vt (Set, mL): 500 mL/ /FiO2 : 100 %  PEEP 5  Recent Labs     02/12/21  1446 02/12/21  1524   PHART 7.173* 7.312*   PCO2ART 44.7 39.4   PO2ART 446.9* 551.4*     It's not entirely clear what transpired after the ablation, but the plan was to do it with MAC and not general anesthesia.  However, per report patient had difficulty breathing when laid flat and it was decided to place an endotracheal tube and perform the procedure with general anesthesia.  The ablation went well, but when patient was being woken up from anesthesia she struggled against the vent, became hypoxic and had to be read sedated and reparalyzed.  The decision was then to bring her up to the ICU for further management as she did not appear to be stable enough for extubation.    On arrival to the ICU she had a blood gas which showed a pH of 7.17 PCO2 of 44 and a PaO2 of 446, this despite having saturations that were reading in the mid 80s when he tried to put her in Trendelenburg.  She still has a  femoral sheath in her right groin which is being used as an arterial line.  It is unclear how much sedation she was given when they had to keep her on the ventilator.  Her lactic acid on arrival was 6.85 and repeat was 8.5.  Because of the low pH and concerns for what it was doing to the oxygen dissociation curve I have her 2 A of bicarb.  She was already on appropriate vent settings with a respiratory rate of 24 and tidal volume of 500.    She is currently on a PEEP of 15 which hopefully we can wean as it may affect her preload and with a new EF of less than 20% she likely needs the support.  She was given diuretics but thus far has not responded.  Prior to the ablation she was on a Lasix drip and was about 2-1/2 L negative for the admission.  She was also on a heparin drip because of the atrial flutter.    The degree of hypoxia seen on pulse oximetry is not consistent with any parenchymal pulmonary disease as her chest x-ray was clear as was her CTPA from a couple days ago.  I think this is a pulmonary vascular and perfusion issue which hopefully will improve as the sedation wears off.    Repeat CHEM panel following the procedure showed similar electrolyte derangements in the sodium and potassium but significant drop in the serum bicarbonate likely secondary to her lactic acidosis, as well as further increase in her creatinine signifying an acute kidney injury.  Given the low EF its difficult to ascertain if patient is volume up or volume down or just has poor forward flow.  She came up on  Levophed and dobutamine but is currently normotensive so those were weaned off quickly.    We will keep the patient on the vent overnight and try to wean the support slowly and keep her as comfortable on sedation as possible.    Holding lasix given AKI and hypotension post procedure.    Critical care time spent reviewing labs/films, examining patient, collaborating with other physicians but excluding procedures for life  threatening organ failure is 43 minutes.      Neville Route, MD

## 2021-02-12 NOTE — Progress Notes (Signed)
Patient hypoglycemic and hypotensive upon shift arrival. Resident team contacted and hemodynamic support provided per order set; hypoglycemic protocol initiatied. CVC placement pending for pressor support; will continue to monitor.

## 2021-02-12 NOTE — Plan of Care (Signed)
Problem: Discharge Planning  Goal: Discharge to home or other facility with appropriate resources  02/12/2021 1227 by Benedetto Coons, RN  Outcome: Progressing  Flowsheets (Taken 02/12/2021 0857)  Discharge to home or other facility with appropriate resources: Identify barriers to discharge with patient and caregiver  02/12/2021 1225 by Benedetto Coons, RN  Outcome: Progressing  02/12/2021 1224 by Benedetto Coons, RN  Outcome: Progressing  Flowsheets (Taken 02/12/2021 0857)  Discharge to home or other facility with appropriate resources: Identify barriers to discharge with patient and caregiver  02/12/2021 0109 by Dionisio Paschal, RN  Outcome: Progressing     Problem: Chronic Conditions and Co-morbidities  Goal: Patient's chronic conditions and co-morbidity symptoms are monitored and maintained or improved  02/12/2021 1227 by Benedetto Coons, RN  Outcome: Progressing  Flowsheets (Taken 02/11/2021 1036)  Care Plan - Patient's Chronic Conditions and Co-Morbidity Symptoms are Monitored and Maintained or Improved:   Monitor and assess patient's chronic conditions and comorbid symptoms for stability, deterioration, or improvement   Collaborate with multidisciplinary team to address chronic and comorbid conditions and prevent exacerbation or deterioration  02/12/2021 1225 by Benedetto Coons, RN  Outcome: Progressing  02/12/2021 1224 by Benedetto Coons, RN  Outcome: Progressing     Problem: Respiratory - Adult  Goal: Achieves optimal ventilation and oxygenation  02/12/2021 1227 by Benedetto Coons, RN  Outcome: Progressing  Flowsheets (Taken 02/12/2021 0857)  Achieves optimal ventilation and oxygenation: Assess for changes in respiratory status  02/12/2021 1225 by Benedetto Coons, RN  Outcome: Progressing  02/12/2021 1224 by Benedetto Coons, RN  Outcome: Progressing  Flowsheets (Taken 02/12/2021 0857)  Achieves optimal ventilation and oxygenation: Assess for changes in respiratory status  02/12/2021 0109 by Dionisio Paschal, RN  Outcome: Progressing      Problem: Cardiovascular - Adult  Goal: Maintains optimal cardiac output and hemodynamic stability  02/12/2021 1227 by Benedetto Coons, RN  Outcome: Progressing  Flowsheets (Taken 02/11/2021 1036)  Maintains optimal cardiac output and hemodynamic stability: Monitor blood pressure and heart rate  Note: Keeping patient on telemetry and monitoring HR and BP  02/12/2021 1225 by Benedetto Coons, RN  Outcome: Progressing  02/12/2021 1224 by Benedetto Coons, RN  Outcome: Progressing  Goal: Absence of cardiac dysrhythmias or at baseline  02/12/2021 1227 by Benedetto Coons, RN  Outcome: Progressing  Flowsheets (Taken 02/11/2021 1036)  Absence of cardiac dysrhythmias or at baseline:   Monitor cardiac rate and rhythm   Assess for signs of decreased cardiac output  02/12/2021 1225 by Benedetto Coons, RN  Outcome: Progressing  02/12/2021 1224 by Benedetto Coons, RN  Outcome: Progressing     Problem: Metabolic/Fluid and Electrolytes - Adult  Goal: Electrolytes maintained within normal limits  02/12/2021 1227 by Benedetto Coons, RN  Outcome: Progressing  Flowsheets (Taken 02/12/2021 0857)  Electrolytes maintained within normal limits: Monitor labs and assess patient for signs and symptoms of electrolyte imbalances  02/12/2021 1225 by Benedetto Coons, RN  Outcome: Progressing  02/12/2021 1224 by Benedetto Coons, RN  Outcome: Progressing  Flowsheets (Taken 02/12/2021 0857)  Electrolytes maintained within normal limits: Monitor labs and assess patient for signs and symptoms of electrolyte imbalances  Goal: Hemodynamic stability and optimal renal function maintained  02/12/2021 1227 by Benedetto Coons, RN  Outcome: Progressing  Flowsheets (Taken 02/12/2021 0857)  Hemodynamic stability and optimal renal function maintained: Monitor labs and assess for signs and symptoms of volume excess or deficit  02/12/2021 1225 by Benedetto Coons, RN  Outcome: Progressing  02/12/2021 1224 by Benedetto Coons, RN  Outcome: Progressing  Flowsheets (Taken 02/12/2021  0857)  Hemodynamic stability and optimal renal function maintained: Monitor labs and assess for signs and symptoms of volume excess or deficit     Problem: Safety - Adult  Goal: Free from fall injury  02/12/2021 1227 by Benedetto Coons, RN  Outcome: Progressing  Flowsheets (Taken 02/12/2021 1218)  Free From Fall Injury: Instruct family/caregiver on patient safety  Note: Discussed falls precautions with patient and interventions used to promote patient safety  02/12/2021 1225 by Benedetto Coons, RN  Outcome: Progressing  02/12/2021 1224 by Benedetto Coons, RN  Outcome: Progressing  Flowsheets (Taken 02/12/2021 1218)  Free From Fall Injury: Instruct family/caregiver on patient safety  02/12/2021 0109 by Dionisio Paschal, RN  Outcome: Progressing  Flowsheets (Taken 02/11/2021 1446 by Benedetto Coons, RN)  Free From Fall Injury: Instruct family/caregiver on patient safety     Problem: ABCDS Injury Assessment  Goal: Absence of physical injury  02/12/2021 1227 by Benedetto Coons, RN  Outcome: Progressing  Flowsheets (Taken 02/12/2021 1218)  Absence of Physical Injury: Implement safety measures based on patient assessment  02/12/2021 1225 by Benedetto Coons, RN  Outcome: Progressing  02/12/2021 1224 by Benedetto Coons, RN  Outcome: Progressing  Flowsheets (Taken 02/12/2021 1218)  Absence of Physical Injury: Implement safety measures based on patient assessment

## 2021-02-13 LAB — CBC WITH AUTO DIFFERENTIAL
Basophils %: 0.3 %
Basophils Absolute: 0 10*3/uL (ref 0.0–0.2)
Eosinophils %: 0 %
Eosinophils Absolute: 0 10*3/uL (ref 0.0–0.6)
Hematocrit: 36.6 % (ref 36.0–48.0)
Hemoglobin: 12 g/dL (ref 12.0–16.0)
Lymphocytes %: 5.9 %
Lymphocytes Absolute: 0.9 10*3/uL — ABNORMAL LOW (ref 1.0–5.1)
MCH: 29.5 pg (ref 26.0–34.0)
MCHC: 32.8 g/dL (ref 31.0–36.0)
MCV: 89.8 fL (ref 80.0–100.0)
MPV: 8.2 fL (ref 5.0–10.5)
Monocytes %: 7.2 %
Monocytes Absolute: 1.1 10*3/uL (ref 0.0–1.3)
Neutrophils %: 86.6 %
Neutrophils Absolute: 13.4 10*3/uL — ABNORMAL HIGH (ref 1.7–7.7)
Platelets: 249 10*3/uL (ref 135–450)
RBC: 4.08 M/uL (ref 4.00–5.20)
RDW: 15 % (ref 12.4–15.4)
WBC: 15.5 10*3/uL — ABNORMAL HIGH (ref 4.0–11.0)

## 2021-02-13 LAB — BLOOD GAS, ARTERIAL
Base Excess, Arterial: 2.4 mmol/L (ref <=3.0)
Carboxyhgb, Arterial: 0.8 % (ref 0.0–1.5)
HCO3, Arterial: 26 mmol/L (ref 21–29)
Hemoglobin, Art, Extended: 12.6 g/dL
Methemoglobin, Arterial: 0 % (ref 0.0–1.4)
O2 Sat, Arterial: 99 % (ref 93–100)
TCO2, Arterial: 27 mmol/L
pCO2, Arterial: 34.4 mmHg — ABNORMAL LOW (ref 35.0–45.0)
pH, Arterial: 7.481 — ABNORMAL HIGH (ref 7.350–7.450)
pO2, Arterial: 113 mmHg — ABNORMAL HIGH (ref 75.0–108.0)

## 2021-02-13 LAB — BASIC METABOLIC PANEL
Anion Gap: 17 — ABNORMAL HIGH (ref 3–16)
Anion Gap: 25 — ABNORMAL HIGH (ref 3–16)
BUN: 40 mg/dL — ABNORMAL HIGH (ref 7–20)
BUN: 51 mg/dL — ABNORMAL HIGH (ref 7–20)
CO2: 16 mmol/L — ABNORMAL LOW (ref 21–32)
CO2: 24 mmol/L (ref 21–32)
Calcium: 8.3 mg/dL (ref 8.3–10.6)
Calcium: 9.3 mg/dL (ref 8.3–10.6)
Chloride: 90 mmol/L — ABNORMAL LOW (ref 99–110)
Chloride: 90 mmol/L — ABNORMAL LOW (ref 99–110)
Creatinine: 2.2 mg/dL — ABNORMAL HIGH (ref 0.6–1.1)
Creatinine: 2.5 mg/dL — ABNORMAL HIGH (ref 0.6–1.1)
GFR African American: 24 — AB (ref >60)
GFR African American: 28 — AB (ref >60)
GFR Non-African American: 20 — AB (ref >60)
GFR Non-African American: 23 — AB (ref >60)
Glucose: 149 mg/dL — ABNORMAL HIGH (ref 70–99)
Glucose: 47 mg/dL — CL (ref 70–99)
Potassium: 3.5 mmol/L (ref 3.5–5.1)
Potassium: 4.7 mmol/L (ref 3.5–5.1)
Sodium: 131 mmol/L — ABNORMAL LOW (ref 136–145)
Sodium: 131 mmol/L — ABNORMAL LOW (ref 136–145)

## 2021-02-13 LAB — BLOOD GAS, VENOUS
Base Excess, Ven: 2.5 mmol/L (ref <=3.0)
Carboxyhemoglobin: 1.4 % (ref 0.0–1.5)
HCO3, Venous: 27.1 mmol/L (ref 24.0–28.0)
Hemoglobin, Ven, Reduced: 12.1 %
MetHgb, Ven: 0.2 % (ref 0.0–1.5)
O2 Sat, Ven: 88 %
TC02 (Calc), Ven: 28 mmol/L
pCO2, Ven: 41 mmHg (ref 41.0–51.0)
pH, Ven: 7.428 (ref 7.350–7.450)
pO2, Ven: 55.3 mmHg — ABNORMAL HIGH (ref 25.0–40.0)

## 2021-02-13 LAB — LACTIC ACID
Lactic Acid: 1.1 mmol/L (ref 0.4–2.0)
Lactic Acid: 1.6 mmol/L (ref 0.4–2.0)
Lactic Acid: 8.1 mmol/L (ref 0.4–2.0)

## 2021-02-13 LAB — MAGNESIUM: Magnesium: 1.7 mg/dL — ABNORMAL LOW (ref 1.80–2.40)

## 2021-02-13 LAB — POCT GLUCOSE
POC Glucose: 100 mg/dl — ABNORMAL HIGH (ref 70–99)
POC Glucose: 99 mg/dl (ref 70–99)

## 2021-02-13 MED ORDER — MILRINONE LACTATE IN DEXTROSE 20-5 MG/100ML-% IV SOLN
20-5 | INTRAVENOUS | Status: DC
Start: 2021-02-13 — End: 2021-02-13
  Administered 2021-02-13: 17:00:00 0.5 ug/kg/min via INTRAVENOUS

## 2021-02-13 MED ORDER — MILRINONE LACTATE IN DEXTROSE 20-5 MG/100ML-% IV SOLN
20-5100- MG/100ML-% | INTRAVENOUS | Status: DC
Start: 2021-02-13 — End: 2021-02-13

## 2021-02-13 MED ORDER — MILRINONE LACTATE IN DEXTROSE 20-5 MG/100ML-% IV SOLN
20-5 MG/100ML-% | INTRAVENOUS | Status: DC
Start: 2021-02-13 — End: 2021-02-13
  Administered 2021-02-13 (×3): 0.75 ug/kg/min via INTRAVENOUS
  Administered 2021-02-13: 04:00:00 0.0625 ug/kg/min via INTRAVENOUS

## 2021-02-13 MED ORDER — DEXTROSE 5 % IV SOLN
5 % | INTRAVENOUS | Status: DC
Start: 2021-02-13 — End: 2021-02-18
  Administered 2021-02-13: 01:00:00 10 ug/min via INTRAVENOUS
  Administered 2021-02-14: 01:00:00 8 ug/min via INTRAVENOUS

## 2021-02-13 MED ORDER — MAGNESIUM SULFATE 2000 MG/50 ML IVPB PREMIX
2 GM/50ML | Freq: Once | INTRAVENOUS | Status: AC
Start: 2021-02-13 — End: 2021-02-13
  Administered 2021-02-13: 23:00:00 2000 mg via INTRAVENOUS

## 2021-02-13 MED ORDER — PROPOFOL 1000 MG/100ML IV EMUL
1000100 MG/100ML | INTRAVENOUS | Status: AC
Start: 2021-02-13 — End: 2021-02-13

## 2021-02-13 MED ORDER — PROPOFOL 1000 MG/100ML IV EMUL
1000100 MG/100ML | INTRAVENOUS | Status: DC
Start: 2021-02-13 — End: 2021-02-15
  Administered 2021-02-13: 08:00:00 40 ug/kg/min via INTRAVENOUS
  Administered 2021-02-13: 15:00:00 30 ug/kg/min via INTRAVENOUS
  Administered 2021-02-13: 05:00:00 50 ug/kg/min via INTRAVENOUS
  Administered 2021-02-13: 12:00:00 40 ug/kg/min via INTRAVENOUS
  Administered 2021-02-13: 01:00:00 25 ug/kg/min via INTRAVENOUS
  Administered 2021-02-13: 03:00:00 50 ug/kg/min via INTRAVENOUS
  Administered 2021-02-13 – 2021-02-14 (×8): 35 ug/kg/min via INTRAVENOUS
  Administered 2021-02-15 (×3): 30 ug/kg/min via INTRAVENOUS
  Administered 2021-02-15: 01:00:00 35 ug/kg/min via INTRAVENOUS

## 2021-02-13 MED ORDER — FENTANYL CITRATE 1000 MCG/100ML IV SOLN
1000 MCG/100ML | INTRAVENOUS | Status: DC
Start: 2021-02-13 — End: 2021-02-15
  Administered 2021-02-13: 02:00:00 25 ug/h via INTRAVENOUS
  Administered 2021-02-13 – 2021-02-14 (×2): 50 ug/h via INTRAVENOUS

## 2021-02-13 MED ORDER — AMIODARONE HCL 150 MG/3ML IV SOLN
150 MG/3ML | INTRAVENOUS | Status: DC
Start: 2021-02-13 — End: 2021-02-17
  Administered 2021-02-14 – 2021-02-17 (×6): 0.5 mg/min via INTRAVENOUS

## 2021-02-13 MED ORDER — DEXTROSE 5 % IV SOLN
5 % | INTRAVENOUS | Status: AC
Start: 2021-02-13 — End: 2021-02-13
  Administered 2021-02-13: 21:00:00 1 mg/min via INTRAVENOUS

## 2021-02-13 MED ORDER — DEXTROSE 5 % IV SOLN
5 % | Freq: Once | INTRAVENOUS | Status: AC
Start: 2021-02-13 — End: 2021-02-13
  Administered 2021-02-13: 21:00:00 150 mg via INTRAVENOUS

## 2021-02-13 MED ORDER — FUROSEMIDE 10 MG/ML IJ SOLN
10 MG/ML | INTRAMUSCULAR | Status: DC
Start: 2021-02-13 — End: 2021-02-18
  Administered 2021-02-13: 17:00:00 5 mg/h via INTRAVENOUS
  Administered 2021-02-14 – 2021-02-16 (×7): 10 mg/h via INTRAVENOUS
  Administered 2021-02-16 – 2021-02-18 (×5): 20 mg/h via INTRAVENOUS

## 2021-02-13 MED FILL — MILRINONE LACTATE IN DEXTROSE 20-5 MG/100ML-% IV SOLN: 20-5 MG/100ML-% | INTRAVENOUS | Qty: 100

## 2021-02-13 MED FILL — MAGNESIUM SULFATE 2 GM/50ML IV SOLN: 2 GM/50ML | INTRAVENOUS | Qty: 50

## 2021-02-13 MED FILL — FUROSEMIDE 10 MG/ML IJ SOLN: 10 mg/mL | INTRAMUSCULAR | Qty: 10

## 2021-02-13 MED FILL — AMIODARONE HCL 150 MG/3ML IV SOLN: 150 MG/3ML | INTRAVENOUS | Qty: 9

## 2021-02-13 MED FILL — DIPRIVAN 1000 MG/100ML IV EMUL: 1000 MG/100ML | INTRAVENOUS | Qty: 100

## 2021-02-13 MED FILL — DEXMEDETOMIDINE HCL 200 MCG/2ML IV SOLN: 200 MCG/2ML | INTRAVENOUS | Qty: 400

## 2021-02-13 MED FILL — DEXMEDETOMIDINE HCL 200 MCG/2ML IV SOLN: 200 MCG/2ML | INTRAVENOUS | Qty: 4

## 2021-02-13 MED FILL — DEXTROSE 10 % IV SOLN: 10 % | INTRAVENOUS | Qty: 250

## 2021-02-13 MED FILL — FENTANYL CITRATE 1000 MCG/100ML IV SOLN: 1000 MCG/100ML | INTRAVENOUS | Qty: 100

## 2021-02-13 MED FILL — AMIODARONE HCL 150 MG/3ML IV SOLN: 150 MG/3ML | INTRAVENOUS | Qty: 3

## 2021-02-13 MED FILL — NOREPINEPHRINE BITARTRATE 1 MG/ML IV SOLN: 1 mg/mL | INTRAVENOUS | Qty: 16

## 2021-02-13 MED FILL — MILRINONE LACTATE IN DEXTROSE 20-5 MG/100ML-% IV SOLN: 20-5 MG/100ML-% | INTRAVENOUS | Qty: 500

## 2021-02-13 NOTE — Progress Notes (Signed)
Sister in law at the bedside. Update given questions answered.  Verbalizes understanding of patient condition.

## 2021-02-13 NOTE — Progress Notes (Signed)
Cardiology Consult Service  Daily Progress Note        Admit Date:  02/09/2021  Primary cardiologist: Dr Harrison Mons / Dr. Clarise Cruz    Reason for Consultation/Chief Complaint: AHF, PAFRVR    Subjective:     Molly [name] is a 60 y.o. female with a past medical history of morbid obesity (BMI 43), non-Hodgkin's lymphoma status postchemotherapy 2006- 2007, SLE, RA.  Patient denies any previous cardiac history.  Patient reports she had MUGA test prior to chemotherapy and during treatment, last MUGA test was 2 years after her last chemotherapy and cardiac function was reportedly normal.  ??  Patient presented to the emergency room on 6/21 with progressively worse shortness of breath, lower extremity edema, weight gain, fatigue over the last 6 weeks.  She admits to occasional fluttering in her chest which is very mild.  At the emergency room she was afebrile, tachycardic in AF RVR/atrial flutter RVR, hypertensive, did not require any supplemental oxygen.  Electrolytes normal, proBNP 4700, troponin x1 negative, CBC normal, TSH normal, chest x-ray unremarkable, CTA chest negative for PE, significant biventricular dilatation consistent with cardiomyopathy.  ??  ECG 02/09/2021: Atrial flutter/coarse A. fib with RVR 138 bpm.  ??  Patient was admitted for acute heart failure and was placed on Lasix drip 10 mg an hour after receiving Lasix 60 mg IV x1.  Today I's and O's -3 L, weight down 2 pounds, creatinine stable 1.9, K3.4, magnesium 1.9.  Telemetry was personally reviewed, reveals A. fib with RVR 110-120 bpm.  ??  Echo 02/05/2021: LVD 6.8 cm, LVEF less than 20%, diastolic grade 3, increased LVEDP, severe RV dilatation and dysfunction, moderate MR/TR, RVSP 40 (15).  Images were personally reviewed.  ??  S/p atrial flutter ablation 6/24.  Patient could not be extubated after the procedure due to hypoxia and therefore was transferred intubated to the ICU.    Interval history:  During rounds this morning, patient was found to be on  norepinephrine at 9 mcg/min, milrinone 0.75 mcg/kg/min, sedation.  Patient was still intubated FiO2 40%, PEEP of 5.  I's and O's and weight unchanged, creatinine slowly increasing from 1.6 now up to 2.5.    Objective:     Medications:  ??? sodium chloride flush  5-40 mL IntraVENous 2 times per day   ??? [Held by provider] apixaban  5 mg Oral BID   ??? [Held by provider] spironolactone  50 mg Oral Daily   ??? [Held by provider] metoprolol tartrate  25 mg Oral BID   ??? [Held by provider] DULoxetine  60 mg Oral Daily   ??? [Held by provider] hydroxychloroquine  400 mg Oral Daily       IV drips:  ??? furosemide (LASIX) 1mg /ml infusion 5 mg/hr (02/13/21 1328)   ??? milrinone 0.25 mcg/kg/min (02/13/21 1506)   ??? amiodarone      Followed by   ??? amiodarone     ??? sodium chloride     ??? dextrose     ??? dexmedetomidine (PRECEDEX) IV infusion 0.5 mcg/kg/hr (02/13/21 1500)   ??? norepinephrine 7 mcg/min (02/13/21 1643)   ??? propofol 30 mcg/kg/min (02/13/21 1112)   ??? fentaNYL 50 mcg/hr (02/13/21 1458)       PRN:  sodium chloride flush, sodium chloride, acetaminophen, glucose, dextrose bolus **OR** dextrose bolus, glucagon (rDNA), dextrose, hydrOXYzine HCl, albuterol, ondansetron **OR** ondansetron, polyethylene glycol    Vitals:    02/13/21 1515 02/13/21 1530 02/13/21 1545 02/13/21 1600   BP:    104/63  Pulse: (!) 101 (!) 101 99 (!) 107   Resp: 27 30 24 24    Temp:    97.6 ??F (36.4 ??C)   TempSrc:    Temporal   SpO2: 97% 97% 95% 97%   Weight:       Height:           Intake/Output Summary (Last 24 hours) at 02/13/2021 1649  Last data filed at 02/13/2021 1600  Gross per 24 hour   Intake 866 ml   Output 1100 ml   Net -234 ml     I/O last 3 completed shifts:  In: 300 [I.V.:300]  Out: 600 [Urine:600]  Wt Readings from Last 3 Encounters:   02/13/21 (!) 309 lb 8.4 oz (140.4 kg)   02/01/21 (!) 336 lb (152.4 kg)   01/29/21 (!) 331 lb (150.1 kg)       Admit Wt: Weight: (!) 315 lb (142.9 kg)   Todays Wt: Weight: (!) 309 lb 8.4 oz (140.4 kg)    TELEMETRY personally  reviewed: Sinus     Physical Exam:         General Appearance:  Alert, cooperative, no distress, appears stated age Appropriate weight   Head:  Normocephalic, without obvious abnormality, atraumatic   Eyes:  PERRL, conjunctiva/corneas clear EOM intact  Ears normal   Throat no lesions       Nose: Nares normal, no drainage or sinus tenderness   Throat: Lips, mucosa, and tongue normal   Neck: Supple, symmetrical, trachea midline, no adenopathy, thyroid: not enlarged, symmetric, no tenderness/mass/nodules, no carotid bruit.        Lungs:   Normal respiratory rate, lungs with ronchi bilaterally.    Chest Wall:  No tenderness or deformity   Heart:  Regular rhythm, rate is controlled, S1, S2 normal, there is no murmur, there is no rub or gallop, cannot assess jvd, 2+ bilateral lower extremity edema   Abdomen:   Soft, non-tender, bowel sounds active all four quadrants,  no masses, no organomegaly       Extremities: Extremities normal, atraumatic, no cyanosis.    Pulses: 2+ and symmetric   Skin: Skin color, texture, turgor normal, no rashes or lesions   Pysch: Normal mood and affect   Neurologic: Normal gross motor and sensory exam.  Cranial nerves intact       Labs:   Recent Labs     02/11/21  0656 02/11/21  2100 02/12/21  0534 02/12/21  1024 02/12/21  1523 02/12/21  1937 02/13/21  0449 02/13/21  0827   NA 132*   < >  --    < > 126* 131*  --  131*   K 4.4   < >  --    < > 5.3* 4.7  --  3.5   BUN 20   < >  --    < > 36* 40*  --  51*   CREATININE 1.4*   < >  --    < > 1.9* 2.2*  --  2.5*   CL 90*   < >  --    < > 87* 90*  --  90*   CO2 27   < >  --    < > 12* 16*  --  24   GLUCOSE 107*   < >  --    < > 111* 47*  --  149*   CALCIUM 8.9   < >  --    < > 9.7 9.3  --  8.3   MG 1.80  --  2.10  --   --   --  1.70*  --     < > = values in this interval not displayed.     Recent Labs     02/12/21  0534 02/12/21  0534 02/12/21  1523 02/12/21  1523 02/13/21  0449   WBC 12.3*  --  13.2*  --  15.5*   HGB 13.6  --  13.6  --  12.0   HCT  42.0  --  41.6  --  36.6   PLT 250  --  249  --  249   MCV 91.3   < > 92.9   < > 89.8    < > = values in this interval not displayed.     No results for input(s): CHOLTOT, TRIG, HDL, CHOLHDL, LDL in the last 72 hours.    Invalid input(s): LIPIDCOMM, VLDCHOL  No results for input(s): PTT, INR in the last 72 hours.    Invalid input(s): PT  Recent Labs     02/10/21  2357   TROPONINI <0.01     No results for input(s): BNP in the last 72 hours.  No results for input(s): NTPROBNP in the last 72 hours.  No results for input(s): TSH in the last 72 hours.    Imaging:       Assessment & Plan:     1. ??Acute and new HFrEF. ??It is of unknown etiology, could be ischemic (severe CAD) versus nonischemic (PAF RVR, less likely??postchemotherapy, viral). ??Patient is volume overloaded and hypotensive requiring pressor support.   2. ??Non-Hodgkin's lymphoma status post chemotherapy, now in remission  3. ??SLE  4. ??Rheumatoid arthritis  5. ??PAF RVR. ??Patient appears to be in atrial flutter/coarse atrial fibrillation with RVR. ??It is of unknown duration and she has not been on any anticoagulation. ??She was minimally symptomatic (minimal palpitations). Patient was started on anticoagulation and EP consulted; patient had AFL ablation on 6/24, remained in sinus post procedure.   ??  ??  ??  - I suspect hypotension due to high-dose milrinone.  I will try to wean patient off the milrinone slowly, diuresis, will drop rate from 0.75 to 0.5 now and for 4 hours, then decrease it further to 0.25 mcg/kg/min and keep it there for today; will get a VBG at 6 pm to assess if low rate milrinone is adequate.   -Cw norepi gtt for BP support  -Will start lasix gtt at 5 mg/hr and adjust as needed.   - Strict I's and O's every shift and standing weights if possible, low-salt diet, daily BMP with reflex to Mg (correct lytes for goals K >4.0 and Mg > 2.0) and wean supplemental oxygen to off (or down to baseline supplemental oxygen requirements) for sats greater than  92-94%.      ADDENDUM:   Later this afternoon, at around 3 PM, patient's rhythm changed into A. fib RVR 100-130 bpm.  Amiodarone bolus and drip was started by ICU team.    - Will stop milrinone drip  - Agree with amiodarone drip  - Will increase Lasix drip to 10 mg/h  ??    Due to the high probability of clinically significant life threating deterioration of the patient's condition that required my urgent intervention, a total critical care time 40 minutes was used. This time excludes any time that may have been spent performing procedures. This includes but not limited to vital sign monitoring, telemetry monitoring, continuous  pulse oximety, IV medication, clinical response to the IV medications, documentation time, consultation time, interpretation of lab data, review of nursing notes and old record review.       I have personally reviewed the reports and images of labs, radiological studies, cardiac studies including ECG's and telemetry, current and old medical records. The note was completed using EMR and Dragon dictation system. Every effort was made to ensure accuracy; however, inadvertent computerized transcription errors may be present.    All questions and concerns were addressed to the patient/family. Alternatives to my treatment were discussed.     Thank you for allowing to [place] to participate in the care or Presleigh A Farnan. Please call our service with questions.    Laurette Schimke, MD, [name], FHFSA  [facility]  [address]  [address] [place] [address]  Ph: [phone]  Fax: [phone]

## 2021-02-13 NOTE — Plan of Care (Signed)
Problem: Discharge Planning  Goal: Discharge to home or other facility with appropriate resources  02/13/2021 0111 by Noralee Space, RN  Outcome: Not Progressing     Problem: Chronic Conditions and Co-morbidities  Goal: Patient's chronic conditions and co-morbidity symptoms are monitored and maintained or improved  02/13/2021 0111 by Noralee Space, RN  Outcome: Not Progressing     Problem: Respiratory - Adult  Goal: Achieves optimal ventilation and oxygenation  02/13/2021 0111 by Noralee Space, RN  Outcome: Progressing     Problem: Cardiovascular - Adult  Goal: Maintains optimal cardiac output and hemodynamic stability  02/13/2021 0111 by Noralee Space, RN  Outcome: Progressing  Flowsheets (Taken 02/12/2021 1445 by Darcus Austin, RN)  Maintains optimal cardiac output and hemodynamic stability:   Monitor blood pressure and heart rate   Monitor urine output and notify Licensed Independent Practitioner for values outside of normal range   Assess for signs of decreased cardiac output     Problem: Cardiovascular - Adult  Goal: Absence of cardiac dysrhythmias or at baseline  02/13/2021 0111 by Noralee Space, RN  Outcome: Progressing  Flowsheets (Taken 02/12/2021 1445 by Darcus Austin, RN)  Absence of cardiac dysrhythmias or at baseline:   Monitor cardiac rate and rhythm   Assess for signs of decreased cardiac output     Problem: Metabolic/Fluid and Electrolytes - Adult  Goal: Electrolytes maintained within normal limits  02/13/2021 0111 by Noralee Space, RN  Outcome: Progressing     Problem: Safety - Adult  Goal: Free from fall injury  02/13/2021 0111 by Noralee Space, RN  Outcome: Progressing     Problem: Metabolic/Fluid and Electrolytes - Adult  Goal: Hemodynamic stability and optimal renal function maintained  02/13/2021 0111 by Noralee Space, RN  Outcome: Progressing  Flowsheets (Taken 02/12/2021 1445 by Darcus Austin, RN)  Hemodynamic stability and optimal renal function maintained:   Monitor labs and assess for  signs and symptoms of volume excess or deficit   Monitor intake, output and patient weight   Monitor urine specific gravity, serum osmolarity and serum sodium as indicated or ordered     Problem: ABCDS Injury Assessment  Goal: Absence of physical injury  02/13/2021 0111 by Noralee Space, RN  Outcome: Progressing     Problem: Safety - Medical Restraint  Goal: Remains free of injury from restraints (Restraint for Interference with Medical Device)  Description: INTERVENTIONS:  1. Determine that other, less restrictive measures have been tried or would not be effective before applying the restraint  2. Evaluate the patient's condition at the time of restraint application  3. Inform patient/family regarding the reason for restraint  4. Q2H: Monitor safety, psychosocial status, comfort, nutrition and hydration  Outcome: Progressing  Flowsheets (Taken 02/12/2021 1700 by Darcus Austin, RN)  Remains free of injury from restraints (restraint for interference with medical device):   Determine that other, less restrictive measures have been tried or would not be effective before applying the restraint   Evaluate the patient's condition at the time of restraint application   Inform patient/family regarding the reason for restraint   Every 2 hours: Monitor safety, psychosocial status, comfort, nutrition and hydration     Problem: Pain  Goal: Verbalizes/displays adequate comfort level or baseline comfort level  Outcome: Progressing

## 2021-02-13 NOTE — Progress Notes (Signed)
Patient art line placed on right radial and right femoral art line with sheath removed; tolerated well; will continue to monitor.

## 2021-02-13 NOTE — Plan of Care (Signed)
Problem: Discharge Planning  Goal: Discharge to home or other facility with appropriate resources  02/13/2021 1445 by Letitia Neri, RN  Outcome: Not Progressing      Problem: Chronic Conditions and Co-morbidities  Goal: Patient's chronic conditions and co-morbidity symptoms are monitored and maintained or improved  02/13/2021 1445 by Letitia Neri, RN  Outcome: Progressing    Problem: Respiratory - Adult  Goal: Achieves optimal ventilation and oxygenation  02/13/2021 1445 by Letitia Neri, RN  Outcome: Progressing    Problem: Cardiovascular - Adult  Goal: Maintains optimal cardiac output and hemodynamic stability  02/13/2021 1445 by Letitia Neri, RN  Outcome: Not Progressing              Problem: Safety - Medical Restraint  Goal: Remains free of injury from restraints (Restraint for Interference with Medical Device)  Description: INTERVENTIONS:  1. Determine that other, less restrictive measures have been tried or would not be effective before applying the restraint  2. Evaluate the patient's condition at the time of restraint application  3. Inform patient/family regarding the reason for restraint  4. Q2H: Monitor safety, psychosocial status, comfort, nutrition and hydration  02/13/2021 1445 by Letitia Neri, RN  Outcome: Progressing  Flowsheets (Taken 02/13/2021 0700)  Remains free of injury from restraints (restraint for interference with medical device): Every 2 hours: Monitor safety, psychosocial status, comfort, nutrition and hydration

## 2021-02-13 NOTE — Plan of Care (Signed)
Problem: Discharge Planning  Goal: Discharge to home or other facility with appropriate resources  02/13/2021 2039 by Noralee Space, RN  Outcome: Progressing     Problem: Chronic Conditions and Co-morbidities  Goal: Patient's chronic conditions and co-morbidity symptoms are monitored and maintained or improved  02/13/2021 2039 by Noralee Space, RN  Outcome: Progressing     Problem: Respiratory - Adult  Goal: Achieves optimal ventilation and oxygenation  02/13/2021 2039 by Noralee Space, RN  Outcome: Progressing     Problem: Cardiovascular - Adult  Goal: Maintains optimal cardiac output and hemodynamic stability  02/13/2021 2039 by Noralee Space, RN  Outcome: Progressing     Problem: Cardiovascular - Adult  Goal: Absence of cardiac dysrhythmias or at baseline  02/13/2021 2039 by Noralee Space, RN  Outcome: Progressing     Problem: Metabolic/Fluid and Electrolytes - Adult  Goal: Electrolytes maintained within normal limits  Outcome: Progressing  Flowsheets (Taken 02/13/2021 0800 by Letitia Neri, RN)  Electrolytes maintained within normal limits: Monitor labs and assess patient for signs and symptoms of electrolyte imbalances     Problem: Metabolic/Fluid and Electrolytes - Adult  Goal: Hemodynamic stability and optimal renal function maintained  Outcome: Progressing  Flowsheets (Taken 02/13/2021 0800 by Letitia Neri, RN)  Hemodynamic stability and optimal renal function maintained: Monitor labs and assess for signs and symptoms of volume excess or deficit     Problem: Skin/Tissue Integrity - Adult  Goal: Skin integrity remains intact  02/13/2021 2039 by Noralee Space, RN  Outcome: Progressing     Problem: Musculoskeletal - Adult  Goal: Return mobility to safest level of function  02/13/2021 2039 by Noralee Space, RN  Outcome: Progressing     Problem: Hematologic - Adult  Goal: Maintains hematologic stability  Outcome: Progressing     Problem: Safety - Adult  Goal: Free from fall injury  Outcome: Progressing     Problem:  Safety - Medical Restraint  Goal: Remains free of injury from restraints (Restraint for Interference with Medical Device)  Description: INTERVENTIONS:  1. Determine that other, less restrictive measures have been tried or would not be effective before applying the restraint  2. Evaluate the patient's condition at the time of restraint application  3. Inform patient/family regarding the reason for restraint  4. Q2H: Monitor safety, psychosocial status, comfort, nutrition and hydration  02/13/2021 2039 by Noralee Space, RN  Outcome: Progressing

## 2021-02-13 NOTE — Progress Notes (Signed)
Patient remains in A fib with a rate of 100 to 130. Amiodarone bolus and drip started per order.

## 2021-02-13 NOTE — Progress Notes (Signed)
ICU Progress Note    Admit Date: 02/09/2021  Day: 1  Vent Day: None  IV Access:Peripheral  IV Fluids:None  Vasopressors:None                Antibiotics: None  Diet: Diet NPO    CC Vent support     Interval history:   Was started on milrinone overnight.  Is on Levophed as well.  Patient's blood pressures have been stable and her lactate has normalized.  She remains intubated and sedated on propofol, fentanyl and Precedex.  These may be contributing to her need for pressors as well.  Creatinine continues to uptrend a little.  May need inotropes for longer.  We will try and see if we can extubate her and take her off sedation and this may help her pressures as well.      HPI:  Molly [name] is a 60 yo female with a PMH of non- hodgkin's lymphoma, Systemic lupus, rheumatoid arthritis who presented to the ED with chief complaints of cough and shortness of breath for the past 6 weeks that got worse recently in the past few days.   As per the patient about 6 weeks ago she was having shortness of breath while walking around with decrease in her levels of activity along with cough with white to clear sputum. It was also associated with weight gain of 46 pounds and lower extremity edema. Patient saw her PCP who recommended her to get an ECHO done, results of which came back last night showing an EF of 20%. Patient has been on lasix 40 mg daily and was started on enteresto and aldactone. She does state of having mild difficulty in lying flat in bed and has been using around 1 pillow which is her baseline. Patient was feeling worse this morning, with a cardiology appointment not until next week she decided to come to the hospital for further evaluation.   ??  On 6/24, patient was taken to cath lab for ablation for a flutter. While she lie down on table, she was having increasing respiratory distress and belly breathing, she then desatted and was intubated. She was also give a dose of IV lasix for HF. She was unable to come off  the vent and transferred to ICU for medical and vent management.   Medications:     Scheduled Meds:  ??? sodium chloride flush  5-40 mL IntraVENous 2 times per day   ??? [Held by provider] apixaban  5 mg Oral BID   ??? [Held by provider] spironolactone  50 mg Oral Daily   ??? [Held by provider] metoprolol tartrate  25 mg Oral BID   ??? [Held by provider] DULoxetine  60 mg Oral Daily   ??? [Held by provider] hydroxychloroquine  400 mg Oral Daily     Continuous Infusions:  ??? sodium chloride     ??? dextrose     ??? dexmedetomidine (PRECEDEX) IV infusion 0.5 mcg/kg/hr (02/13/21 0756)   ??? norepinephrine 8 mcg/min (02/13/21 0920)   ??? propofol 35 mcg/kg/min (02/13/21 0921)   ??? fentaNYL 25 mcg/hr (02/13/21 0530)   ??? milrinone 0.75 mcg/kg/min (02/13/21 0919)   ??? [Held by provider] furosemide Stopped (02/10/21 2334)     PRN Meds:sodium chloride flush, sodium chloride, acetaminophen, glucose, dextrose bolus **OR** dextrose bolus, glucagon (rDNA), dextrose, hydrOXYzine HCl, albuterol, ondansetron **OR** ondansetron, polyethylene glycol    Objective:   Vitals:   T-max:  Patient Vitals for the past 8 hrs:   BP  Temp Temp src Pulse Resp SpO2 Weight   02/13/21 1000 -- -- -- 93 26 -- --   02/13/21 0945 -- -- -- 92 24 97 % --   02/13/21 0930 -- -- -- 90 24 97 % --   02/13/21 0915 -- -- -- 94 24 97 % --   02/13/21 0900 (!) 98/52 -- -- 91 24 97 % --   02/13/21 0845 -- -- -- 77 20 95 % --   02/13/21 0843 -- -- -- (!) 122 24 99 % --   02/13/21 0830 -- -- -- 91 24 98 % --   02/13/21 0815 -- -- -- 91 24 98 % --   02/13/21 0800 124/63 97.7 ??F (36.5 ??C) Temporal 91 24 98 % --   02/13/21 0554 -- -- -- -- -- -- (!) 309 lb 8.4 oz (140.4 kg)   02/13/21 0515 -- -- -- 88 24 99 % --   02/13/21 0500 121/65 -- -- 89 24 99 % --   02/13/21 0445 -- -- -- 89 24 99 % --   02/13/21 0430 -- -- -- 88 24 98 % --   02/13/21 0426 -- 97.6 ??F (36.4 ??C) Temporal -- -- -- --   02/13/21 0415 -- -- -- 89 24 98 % --   02/13/21 0401 -- -- -- 89 24 98 % --   02/13/21 0400 (!) 105/58 --  -- 74 24 98 % --   02/13/21 0345 -- -- -- 92 20 95 % --   02/13/21 0330 -- -- -- 89 24 99 % --   02/13/21 0315 -- -- -- 89 24 99 % --   02/13/21 0300 (!) 96/53 -- -- 89 24 99 % --   02/13/21 0245 -- -- -- 90 24 99 % --   02/13/21 0230 -- -- -- 90 24 100 % --       Intake/Output Summary (Last 24 hours) at 02/13/2021 1017  Last data filed at 02/13/2021 1000  Gross per 24 hour   Intake 300 ml   Output 590 ml   Net -290 ml       Physical Exam  Constitutional:       General: She is not in acute distress.     Comments: Intubated and sedated   HENT:      Head: Normocephalic and atraumatic.      Mouth/Throat:      Mouth: Mucous membranes are moist.      Pharynx: Oropharynx is clear.   Eyes:      Conjunctiva/sclera: Conjunctivae normal.      Pupils: Pupils are equal, round, and reactive to light.   Cardiovascular:      Rate and Rhythm: Normal rate and regular rhythm.      Pulses: Normal pulses.      Heart sounds: Normal heart sounds. No murmur heard.  No friction rub. No gallop.    Pulmonary:      Effort: Pulmonary effort is normal.      Breath sounds: Normal breath sounds. No wheezing, rhonchi or rales.   Abdominal:      General: Bowel sounds are normal.      Palpations: Abdomen is soft.      Tenderness: There is no abdominal tenderness.   Musculoskeletal:         General: Normal range of motion.      Cervical back: Normal range of motion and neck supple.      Right lower leg: No edema.  Left lower leg: No edema.   Skin:     General: Skin is warm and dry.      Capillary Refill: Capillary refill takes less than 2 seconds.   Neurological:      Comments: Intubated and sedated   Psychiatric:      Comments: Intubated and sedated           LABS:    CBC:   Recent Labs     02/12/21  0534 02/12/21  1523 02/13/21  0449   WBC 12.3* 13.2* 15.5*   HGB 13.6 13.6 12.0   HCT 42.0 41.6 36.6   PLT 250 249 249   MCV 91.3 92.9 89.8     Renal:    Recent Labs     02/10/21  1431 02/10/21  2221 02/11/21  0656 02/11/21  2100 02/12/21  0534  02/12/21  1024 02/12/21  1523 02/12/21  1937 02/13/21  0449 02/13/21  0827   NA 130*   < > 132*   < >  --    < > 126* 131*  --  131*   K 3.5   < > 4.4   < >  --    < > 5.3* 4.7  --  3.5   CL 91*   < > 90*   < >  --    < > 87* 90*  --  90*   CO2 25   < > 27   < >  --    < > 12* 16*  --  24   BUN 13   < > 20   < >  --    < > 36* 40*  --  51*   CREATININE 1.0   < > 1.4*   < >  --    < > 1.9* 2.2*  --  2.5*   GLUCOSE 113*   < > 107*   < >  --    < > 111* 47*  --  149*   CALCIUM 9.2   < > 8.9   < >  --    < > 9.7 9.3  --  8.3   MG  --    < > 1.80  --  2.10  --   --   --  1.70*  --    PHOS 3.7  --   --   --   --   --   --   --   --   --    ANIONGAP 14   < > 15   < >  --    < > 27* 25*  --  17*    < > = values in this interval not displayed.     Hepatic:   Recent Labs     02/10/21  1431 02/12/21  1523   AST  --  86*   ALT  --  79*   BILITOT  --  1.9*   PROT  --  6.6   LABALBU 4.3 4.7   ALKPHOS  --  138*     Troponin:   Recent Labs     02/10/21  2357   TROPONINI <0.01     BNP: No results for input(s): BNP in the last 72 hours.  Lipids: No results for input(s): CHOL, HDL in the last 72 hours.    Invalid input(s): LDLCALCU, TRIGLYCERIDE  ABGs:    Recent Labs     02/12/21  1446 02/12/21  1524 02/13/21  0800   PHART 7.173* 7.312* 7.481*   PCO2ART 44.7 39.4 34.4*   PO2ART 446.9* 551.4* 113.0*   HCO3ART 16.4* 19.9* 26   BEART -12* -6* 2.4   O2SATART 100 100 99   TCO2ART 18 21 27        INR:   Recent Labs     02/10/21  1037   INR 1.27*     Lactate:   Recent Labs     02/12/21  1446 02/12/21  1524   LACTATE 7.22* 6.85*     Cultures:  -----------------------------------------------------------------  RAD:   XR CHEST PORTABLE   Final Result      1. Endotracheal tube in stable position. Right IJ central venous catheter placed with the tip in the distal SVC in satisfactory position   2. Stable cardiac enlargement with no acute segmental consolidation               XR CHEST PORTABLE   Final Result   Impression:       Tip of endotracheal  tube is 5 cm the carina.      Lungs are clear. No pneumothorax.      CT CHEST PULMONARY EMBOLISM W CONTRAST   Final Result      1. No evidence of any pulmonary embolism.   2. Four-chamber cardiac enlargement with reflux of contrast into the hepatic veins. This can be seen with elevated right heart pressures.   3. No acute pulmonary process.      XR CHEST PORTABLE   Final Result      Stable cardiac enlargement      No acute consolidation                  Assessment/Plan:   60 yo female with a PMH of NHL, SLE who presented to the ED with chief complaints of cough and shortness of breath for the past 6 weeks that got worse recently in the past few days  ??  Acute Exacerbation of New-Onset HFrEF   Cardiogenic shock  Recent ECHO on 6/17 with severe dilation of LV, EF of <20%, severe diffuse hypokinesis and grade 3 diastolic dysfunction.  - Lasix gtt; holding at this time??2/2 AKI and hypotension  - Holding Toprol XL ER 25 mg PO daily  - Holding Aldactone 50 mg daily??  - requires ischemic w/u, consider when euvolemic??  - Cards c/s - appreciate recs  -s/p AFL ablation  - Started on Milrinone overnight for hypotension  - On levo  - sedated and intubated on 3 sedatives [Fentanyl, propofol, Precedex] which may be contributing to hypotension as well     ARF 2/2 acute HFrEF s/p a flutter ablation   -mechanical ventilation and sedation  -Pressors/inotropes as above  - holding Lasix drip at this time  -cont to monitor  -Cr trending up    New-Onset Atrial Flutter s/p ablation   Patient endorses SOB however no chest pain or palpitations. She states she feels anxious. Tachycardic on exam.   Arrived to ED in Atrial Flutter based on EKG obtained at admission. Given Metoprolol 2.5 mg once on admission. ??  -??ablation w/ EP 6/24  - Cards??and EP??c/s - reccs appreciated  - In normal sinus rhythm  ??  Anxiety  Patient denies any history of. Appears very anxious this admission. Consider related to Atrial flutter and its ensuing symptoms.  -  continue Atarax 10 mg TID PRN  - can spot dose Ativan 0.5 mg for uncontrolled episodes   ??  Chronic Medical Conditions:  SLE - holding Plaquenil 400 mg PO daily, Cymbalta 60 mg PO daily as NPO  OA - holding Relafen    Code Status:  FEN: NPO  PPX: Eliquis  DISPO: ICU    This patient has been staffed and discussed with Dr Mckinley Jewel.    Charlesetta Garibaldi, MD, PGY-2  02/13/21  10:17 AM  Perfect Serve      Patient seen, examined and discussed with the resident and I agree with the assessment and plan.  ??  Vent Mode: AC/PRVC Resp Rate (Set): 24 bmp/Vt (Set, mL): 500 mL/ /FiO2 : 50 %  PEEP 8       Recent Labs     02/12/21  1446 02/12/21  1524   PHART 7.173* 7.312*   PCO2ART 44.7 39.4   PO2ART 446.9* 551.4*   ??  Patient intubated now sedated on propofol, fentanyl and Precedex.  She became hypotensive again overnight and was started on Levophed and milrinone.  She has had some increase in urine output since the addition of inotropic support but her total urine output overnight was only 300 mL so it remains low.  She had an elevated lactic acidosis when she arrived last night but has not had a repeat in 1 to do that this morning and also get a repeat ABG.  Current vent settings are above but I did decrease her PEEP to 5 and her FiO2 to 40% as she has been oxygenating well and her chest x-ray is clear.  ??  Etiology for her events following the ablation seem most consistent with cardiogenic shock at least contextually.  The fact that she is requiring pressors again may be related to her poor myocardial function or because she had to be started on 3 different sedatives.  My hope is that we can wean the sedation and try to get her off the ventilator today.  ??  However, she had a worsening acute kidney injury yesterday and I do not have morning lab values to know if she continues to worsen or is turning the corner.  Hopefully the latter.  If renal function is worsening it is likely a cardiorenal syndrome and she may need  inotropic support even if she does not need pressors.  Currently she is on milrinone we will confer with cardiology if this is the plan they would like to continue.  She has stayed out of atrial flutter since the ablation.  ??  Critical care time spent reviewing labs/films, examining patient, collaborating with other physicians but excluding procedures for life threatening organ failure is 36 minutes.  ??  ??  Maxwell Caul, MD

## 2021-02-13 NOTE — Progress Notes (Signed)
Hospitalist Progress Note      PCP: Lindell Spar, APRN - CNP    Date of Admission: 02/09/2021    Chief Complaint: Shortness of breath     Hospital Course: see H&P     Subjective: seen and examined at bedside. Patient remain intubated and sedated. Family at bedside.     Medications:  Reviewed    Infusion Medications   ??? furosemide (LASIX) 1mg /ml infusion 10 mg/hr (02/13/21 1804)   ??? amiodarone 0.5 mg/min (02/13/21 2145)   ??? sodium chloride     ??? dextrose     ??? dexmedetomidine (PRECEDEX) IV infusion 0.5 mcg/kg/hr (02/13/21 2141)   ??? norepinephrine 6 mcg/min (02/13/21 2148)   ??? propofol 35 mcg/kg/min (02/13/21 2140)   ??? fentaNYL 50 mcg/hr (02/13/21 1458)     Scheduled Medications   ??? sodium chloride flush  5-40 mL IntraVENous 2 times per day   ??? [Held by provider] apixaban  5 mg Oral BID   ??? [Held by provider] spironolactone  50 mg Oral Daily   ??? [Held by provider] metoprolol tartrate  25 mg Oral BID   ??? [Held by provider] DULoxetine  60 mg Oral Daily   ??? [Held by provider] hydroxychloroquine  400 mg Oral Daily     PRN Meds: atropine, sodium chloride flush, sodium chloride, acetaminophen, glucose, dextrose bolus **OR** dextrose bolus, glucagon (rDNA), dextrose, hydrOXYzine HCl, albuterol, ondansetron **OR** ondansetron, polyethylene glycol      Intake/Output Summary (Last 24 hours) at 02/13/2021 2307  Last data filed at 02/13/2021 2209  Gross per 24 hour   Intake 866 ml   Output 2210 ml   Net -1344 ml       Physical Exam Performed:    BP 104/63    Pulse (!) 103    Temp 97.1 ??F (36.2 ??C) (Temporal)    Resp (!) 39    Ht 5\' 11"  (1.803 m)    Wt (!) 309 lb 8.4 oz (140.4 kg)    SpO2 96%    BMI 43.17 kg/m??     General appearance: No apparent distress, appears stated age and cooperative.  HEENT: Pupils equal, round, and reactive to light. Conjunctivae/corneas clear.  Neck: Supple, with full range of motion. No jugular venous distention. Trachea midline.  Respiratory:  Normal respiratory effort. Clear to  auscultation, bilaterally without Rales/Wheezes/Rhonchi.  Cardiovascular: Regular rate and rhythm with normal S1/S2 without murmurs, rubs or gallops.  Abdomen: Soft, non-tender, non-distended with normal bowel sounds.  Musculoskeletal: No clubbing, cyanosis or edema bilaterally.  Full range of motion without deformity.  Skin: Skin color, texture, turgor normal.  No rashes or lesions.  Neurologic:  Neurovascularly intact without any focal sensory/motor deficits. Cranial nerves: II-XII intact, grossly non-focal.  Psychiatric: Alert and oriented, thought content appropriate, normal insight  Capillary Refill: Brisk,3 seconds, normal   Peripheral Pulses: +2 palpable, equal bilaterally       Labs:   Recent Labs     02/12/21  0534 02/12/21  1523 02/13/21  0449   WBC 12.3* 13.2* 15.5*   HGB 13.6 13.6 12.0   HCT 42.0 41.6 36.6   PLT 250 249 249     Recent Labs     02/12/21  1937 02/13/21  0827 02/13/21  2032   NA 131* 131* 134*   K 4.7 3.5 3.2*   CL 90* 90* 95*   CO2 16* 24 22   BUN 40* 51* 52*   CREATININE 2.2* 2.5* 2.3*   CALCIUM 9.3  8.3 8.3     Recent Labs     02/12/21  1523   AST 86*   ALT 79*   BILITOT 1.9*   ALKPHOS 138*     No results for input(s): INR in the last 72 hours.  Recent Labs     02/10/21  2357   TROPONINI <0.01       Urinalysis:      Lab Results   Component Value Date    NITRU Negative 02/09/2021    WBCUA 3-5 02/09/2021    BACTERIA RARE 04/19/2017    RBCUA 3-4 02/09/2021    BLOODU Negative 02/09/2021    SPECGRAV 1.015 02/09/2021    GLUCOSEU Negative 02/09/2021       Radiology:  XR CHEST PORTABLE   Final Result      1. Endotracheal tube in stable position. Right IJ central venous catheter placed with the tip in the distal SVC in satisfactory position   2. Stable cardiac enlargement with no acute segmental consolidation               XR CHEST PORTABLE   Final Result   Impression:       Tip of endotracheal tube is 5 cm the carina.      Lungs are clear. No pneumothorax.      CT CHEST PULMONARY EMBOLISM W  CONTRAST   Final Result      1. No evidence of any pulmonary embolism.   2. Four-chamber cardiac enlargement with reflux of contrast into the hepatic veins. This can be seen with elevated right heart pressures.   3. No acute pulmonary process.      XR CHEST PORTABLE   Final Result      Stable cardiac enlargement      No acute consolidation                    Assessment/Plan:    Active Hospital Problems    Diagnosis    ??? Atrial flutter with rapid ventricular response (HCC) [I48.92]      Priority: Medium   ??? Dilated cardiomyopathy (HCC) [I42.0]      Priority: Medium   ??? Acute respiratory failure with hypoxia (HCC) [J96.01]      Priority: Medium   ??? Lactic acidosis [E87.2]      Priority: Medium   ??? CHF (congestive heart failure), NYHA class I, acute on chronic, combined (HCC) [I50.43]      Priority: Medium   ??? Shortness of breath [R06.02]      Priority: Medium     60 yo female with a PMH of NHL, SLE who presented to the ED with chief complaints of cough and shortness of breath for the past 6 weeks that got worse recently in the past few days  ??  Acute Exacerbation of New-Onset HFrEF   Cardiogenic shock  Recent ECHO on 6/17 with severe dilation of LV, EF of <20%, severe diffuse hypokinesis and grade 3 diastolic dysfunction.  - On Lasix gtt;   - On Levophed  -on Amiodarone gtt  - Holding Toprol XL ER 25 mg PO daily  - requires ischemic w/u, consider when euvolemic??  - Cards following - appreciate recs  - sedated and intubated on 3 sedatives [Fentanyl, propofol, Precedex] which may be contributing to hypotension as well   ??  Acute hypoxic respiratory failure 2/2 acute HFrEF s/p a flutter ablation (6/24)  -mechanical ventilation??and sedation  -Pressors/inotropes as above  -on Amiodarone gtt  -  Holding Toprol XL ER 25 mg PO daily  -cont to monitor  ??  New-Onset Atrial Flutter??s/p ablation??(6/24)??  -??ablation w/ EP 6/24  -on Amiodarone gtt  - Holding Toprol XL ER 25 mg PO daily  - Cards??and EP??c/s - reccs  appreciated    AKI  -likely 2/2 hypoperfusion due to cardiogenic shock  -Cr 2.3 today , Cr baseline 1  -Avoid nephrotoxic medications    ????  Chronic Medical Conditions:  SLE - holding Plaquenil 400 mg PO daily, Cymbalta 60 mg PO daily as NPO  OA - holding Relafen  Morbid obesity BMI 43, complicates overall care  Non-Hodgkin's lymphoma status postchemo 2006-2007    DVT Prophylaxis: E;iquis  Diet: Diet NPO  Code Status: Full Code    PT/OT Eval Status: will order when extubated    Dispo -ICU    Nehemiah Massed, MD

## 2021-02-13 NOTE — Procedures (Signed)
Molly [name] is a 60 y.o. female patient.  1. Shortness of breath    2. Congestive heart failure, unspecified HF chronicity, unspecified heart failure type (HCC)    3. Acute on chronic combined systolic and diastolic congestive heart failure (HCC)    4. CHF (congestive heart failure), NYHA class I, acute on chronic, combined (HCC)      Past Medical History:   Diagnosis Date   ??? Amenorrhea    ??? Cancer (HCC)    ??? History of non-Hodgkin's lymphoma 2006    treated with radiation and chemo   ??? Obesity    ??? Systemic lupus     followed by Dr. Harley Hallmark     Blood pressure 104/63, pulse (!) 103, temperature 97.1 ??F (36.2 ??C), temperature source Temporal, resp. rate (!) 39, height 5\' 11"  (1.803 m), weight (!) 309 lb 8.4 oz (140.4 kg), SpO2 96 %, not currently breastfeeding.    Insert Arterial Line    Date/Time: 02/13/2021 8:47 PM  Performed by: 02/15/2021, MD  Authorized by: Parks Neptune Murillo-Garcia, MD   Consent: The procedure was performed in an emergent situation.  Relevant documents: relevant documents present and verified  Site marked: the operative site was marked  Required items: required blood products, implants, devices, and special equipment available  Patient identity confirmed: arm band, provided demographic data and hospital-assigned identification number  Time out: Immediately prior to procedure a "time out" was called to verify the correct patient, procedure, equipment, support staff and site/side marked as required.  Preparation: Patient was prepped and draped in the usual sterile fashion.  Indications: hemodynamic monitoring  Location: right radial    Sedation:  Patient sedated: yes  Sedatives: fentanyl and see MAR for details  Analgesia: fentanyl    Allen's test normal: yes  Needle gauge: 18  Number of attempts: 1  Post-procedure: line sutured and dressing applied  Post-procedure CMS: normal and unchanged  Patient tolerance: patient tolerated the procedure well with no immediate  complications  Comments: EBL <5 mL          Vania Rea, MD  02/13/2021

## 2021-02-13 NOTE — Progress Notes (Signed)
Physician Progress Note      PATIENTRYLANN, MUNFORD  CSN #:                  [id number]  DOB:                       06-Apr-1961  ADMIT DATE:       02/09/2021 6:11 PM  DISCH DATE:  RESPONDING  PROVIDER #:        Carlis Stable MD          QUERY TEXT:    Pt admitted with CHF exacerbation.  S/P ablation 6/24 desaturated to 65% and   left intubated. Pt noted to have "ARF 2/2 acute HFrEF s/p a flutter ablation"   per ICU c/s note 6/24. If possible, please further specify "ARF" to:      The medical record reflects the following:  Risk Factors: CHF exacerbation  Clinical Indicators: Per ICU c/s note "ARF 2/2 acute HFrEF s/p a flutter   ablation. mechanical ventilation". Post procedure note by EP cards 6/24 "she   started having shortness of breath and have more abdominal form of breathing.   Her saturation was down to 65%". ABG PH: 7.17, PcO2: 44.7, PaO2: 446, HcO3:   16.4  Treatment: Elective intubation pre-procedure, left intubated post-procedure,   Lasix 40 mg IVP, ABG x 2  Options provided:  -- Acute respiratory failure with hypoxia  -- Acute respiratory failure with hypercapnia  -- Acute respiratory failure with hypoxia and hypercapnia  -- Other - I will add my own diagnosis  -- Disagree - Not applicable / Not valid  -- Disagree - Clinically unable to determine / Unknown  -- Refer to Clinical Documentation Reviewer    PROVIDER RESPONSE TEXT:    This patient is in acute respiratory failure with hypoxia.    Query created by: Algis Downs on 02/12/2021 4:04 PM      Electronically signed by:  Carlis Stable MD 02/13/2021 12:06 PM

## 2021-02-14 LAB — BLOOD GAS, ARTERIAL
Base Excess, Arterial: 3.8 mmol/L — ABNORMAL HIGH (ref <=3.0)
Carboxyhgb, Arterial: 1.5 % (ref 0.0–1.5)
HCO3, Arterial: 28 mmol/L (ref 21–29)
Hemoglobin, Art, Extended: 13.5 g/dL
Methemoglobin, Arterial: 0.1 % (ref 0.0–1.4)
O2 Sat, Arterial: 99 % (ref 93–100)
TCO2, Arterial: 30 mmol/L
pCO2, Arterial: 41.7 mmHg (ref 35.0–45.0)
pH, Arterial: 7.441 (ref 7.350–7.450)
pO2, Arterial: 102 mmHg (ref 75.0–108.0)

## 2021-02-14 LAB — BASIC METABOLIC PANEL
Anion Gap: 17 — ABNORMAL HIGH (ref 3–16)
Anion Gap: 8 (ref 3–16)
BUN: 52 mg/dL — ABNORMAL HIGH (ref 7–20)
BUN: 14 mg/dL (ref 7–20)
CO2: 22 mmol/L (ref 21–32)
CO2: 33 mmol/L — ABNORMAL HIGH (ref 21–32)
Calcium: 8.3 mg/dL (ref 8.3–10.6)
Calcium: 8.6 mg/dL (ref 8.3–10.6)
Chloride: 95 mmol/L — ABNORMAL LOW (ref 99–110)
Chloride: 93 mmol/L — ABNORMAL LOW (ref 99–110)
Creatinine: 2.3 mg/dL — ABNORMAL HIGH (ref 0.6–1.1)
Creatinine: 0.7 mg/dL (ref 0.6–1.1)
GFR African American: 26 — AB (ref >60)
GFR African American: >60 (ref >60)
GFR Non-African American: 22 — AB (ref >60)
GFR Non-African American: >60 (ref >60)
Glucose: 137 mg/dL — ABNORMAL HIGH (ref 70–99)
Glucose: 85 mg/dL (ref 70–99)
Potassium: 3.2 mmol/L — ABNORMAL LOW (ref 3.5–5.1)
Potassium: 4.4 mmol/L (ref 3.5–5.1)
Sodium: 134 mmol/L — ABNORMAL LOW (ref 136–145)
Sodium: 134 mmol/L — ABNORMAL LOW (ref 136–145)

## 2021-02-14 LAB — CBC WITH AUTO DIFFERENTIAL
Basophils %: 0.7 %
Basophils Absolute: 0.1 10*3/uL (ref 0.0–0.2)
Eosinophils %: 0.5 %
Eosinophils Absolute: 0.1 10*3/uL (ref 0.0–0.6)
Hematocrit: 36.4 % (ref 36.0–48.0)
Hemoglobin: 12.1 g/dL (ref 12.0–16.0)
Lymphocytes %: 16.3 %
Lymphocytes Absolute: 1.9 10*3/uL (ref 1.0–5.1)
MCH: 29.8 pg (ref 26.0–34.0)
MCHC: 33.3 g/dL (ref 31.0–36.0)
MCV: 89.4 fL (ref 80.0–100.0)
MPV: 7.9 fL (ref 5.0–10.5)
Monocytes %: 11.7 %
Monocytes Absolute: 1.3 10*3/uL (ref 0.0–1.3)
Neutrophils %: 70.8 %
Neutrophils Absolute: 8.1 10*3/uL — ABNORMAL HIGH (ref 1.7–7.7)
Platelets: 230 10*3/uL (ref 135–450)
RBC: 4.07 M/uL (ref 4.00–5.20)
RDW: 15 % (ref 12.4–15.4)
WBC: 11.5 10*3/uL — ABNORMAL HIGH (ref 4.0–11.0)

## 2021-02-14 LAB — BLOOD GAS, VENOUS
Base Excess, Ven: 5.1 mmol/L — ABNORMAL HIGH (ref <=3.0)
Carboxyhemoglobin: 1.3 % (ref 0.0–1.5)
HCO3, Venous: 30.2 mmol/L — ABNORMAL HIGH (ref 24.0–28.0)
Hemoglobin, Ven, Reduced: 24.3 %
MetHgb, Ven: 0.2 % (ref 0.0–1.5)
O2 Sat, Ven: 75 %
TC02 (Calc), Ven: 32 mmol/L
pCO2, Ven: 45.1 mmHg (ref 41.0–51.0)
pH, Ven: 7.434 (ref 7.350–7.450)
pO2, Ven: 41.6 mmHg — ABNORMAL HIGH (ref 25.0–40.0)

## 2021-02-14 LAB — RENAL FUNCTION PANEL
Albumin: 1.7 g/dL — ABNORMAL LOW (ref 3.4–5.0)
Albumin: 3.5 g/dL (ref 3.4–5.0)
Anion Gap: 10 (ref 3–16)
Anion Gap: 15 (ref 3–16)
BUN: 30 mg/dL — ABNORMAL HIGH (ref 7–20)
BUN: 47 mg/dL — ABNORMAL HIGH (ref 7–20)
CO2: 17 mmol/L — ABNORMAL LOW (ref 21–32)
CO2: 26 mmol/L (ref 21–32)
Calcium: 4.1 mg/dL — CL (ref 8.3–10.6)
Calcium: 8 mg/dL — ABNORMAL LOW (ref 8.3–10.6)
Chloride: 116 mmol/L — ABNORMAL HIGH (ref 99–110)
Chloride: 94 mmol/L — ABNORMAL LOW (ref 99–110)
Creatinine: 0.9 mg/dL (ref 0.6–1.1)
Creatinine: 1.8 mg/dL — ABNORMAL HIGH (ref 0.6–1.1)
GFR African American: >60 (ref >60)
GFR African American: 35 — AB (ref >60)
GFR Non-African American: >60 (ref >60)
GFR Non-African American: 29 — AB (ref >60)
Glucose: 71 mg/dL (ref 70–99)
Glucose: 120 mg/dL — ABNORMAL HIGH (ref 70–99)
Phosphorus: 2.4 mg/dL — ABNORMAL LOW (ref 2.5–4.9)
Phosphorus: 4.4 mg/dL (ref 2.5–4.9)
Potassium: 2 mmol/L — CL (ref 3.5–5.1)
Potassium: 3.5 mmol/L (ref 3.5–5.1)
Sodium: 143 mmol/L (ref 136–145)
Sodium: 135 mmol/L — ABNORMAL LOW (ref 136–145)

## 2021-02-14 LAB — MAGNESIUM: Magnesium: 2 mg/dL (ref 1.80–2.40)

## 2021-02-14 MED ORDER — ATROPINE SULFATE 1 MG/10ML IJ SOSY
1 MG/0ML | Freq: Once | INTRAMUSCULAR | Status: AC | PRN
Start: 2021-02-14 — End: 2021-02-13

## 2021-02-14 MED ORDER — DIGOXIN 0.25 MG/ML IJ SOLN
0.25 MG/ML | Freq: Once | INTRAMUSCULAR | Status: AC
Start: 2021-02-14 — End: 2021-02-14
  Administered 2021-02-14: 15:00:00 500 ug via INTRAVENOUS

## 2021-02-14 MED FILL — FENTANYL CITRATE 1000 MCG/100ML IV SOLN: 1000 MCG/100ML | INTRAVENOUS | Qty: 100

## 2021-02-14 MED FILL — DEXMEDETOMIDINE HCL 200 MCG/2ML IV SOLN: 200 MCG/2ML | INTRAVENOUS | Qty: 4

## 2021-02-14 MED FILL — DIPRIVAN 1000 MG/100ML IV EMUL: 1000 MG/100ML | INTRAVENOUS | Qty: 100

## 2021-02-14 MED FILL — ATROPINE SULFATE 1 MG/10ML IJ SOSY: 1 MG/0ML | INTRAMUSCULAR | Qty: 10

## 2021-02-14 MED FILL — AMIODARONE HCL 150 MG/3ML IV SOLN: 150 MG/3ML | INTRAVENOUS | Qty: 9

## 2021-02-14 MED FILL — FUROSEMIDE 10 MG/ML IJ SOLN: 10 mg/mL | INTRAMUSCULAR | Qty: 10

## 2021-02-14 MED FILL — DIGOXIN 0.25 MG/ML IJ SOLN: 0.25 mg/mL | INTRAMUSCULAR | Qty: 2

## 2021-02-14 NOTE — Progress Notes (Signed)
Cardiology Consult Service  Daily Progress Note        Admit Date:  02/09/2021  Primary cardiologist: Dr Harrison Mons / Dr. Clarise Cruz    Reason for Consultation/Chief Complaint: AHF, PAFRVR    Subjective:     Molly [name] is a 60 y.o. female with a past medical history of morbid obesity (BMI 43), non-Hodgkin's lymphoma status postchemotherapy 2006- 2007, SLE, RA.  Patient denies any previous cardiac history.  Patient reports she had MUGA test prior to chemotherapy and during treatment, last MUGA test was 2 years after her last chemotherapy and cardiac function was reportedly normal.  ??  Patient presented to the emergency room on 6/21 with progressively worse shortness of breath, lower extremity edema, weight gain, fatigue over the last 6 weeks.  She admits to occasional fluttering in her chest which is very mild.  At the emergency room she was afebrile, tachycardic in AF RVR/atrial flutter RVR, hypertensive, did not require any supplemental oxygen.  Electrolytes normal, proBNP 4700, troponin x1 negative, CBC normal, TSH normal, chest x-ray unremarkable, CTA chest negative for PE, significant biventricular dilatation consistent with cardiomyopathy.  ??  ECG 02/09/2021: Atrial flutter/coarse A. fib with RVR 138 bpm.  ??  Patient was admitted for acute heart failure and was placed on Lasix drip 10 mg an hour after receiving Lasix 60 mg IV x1.  Today I's and O's -3 L, weight down 2 pounds, creatinine stable 1.9, K3.4, magnesium 1.9.  Telemetry was personally reviewed, reveals A. fib with RVR 110-120 bpm.  ??  Echo 02/05/2021: LVD 6.8 cm, LVEF less than 20%, diastolic grade 3, increased LVEDP, severe RV dilatation and dysfunction, moderate MR/TR, RVSP 40 (15).  Images were personally reviewed.  ??  S/p atrial flutter ablation 6/24.  Patient could not be extubated after the procedure due to hypoxia and therefore was transferred intubated to the ICU.    Interval history:  Patient remains intubated with minimum oxygen requirements.   She remains on Lasix, low-dose norepinephrine and amiodarone drip.  Telemetry was personally reviewed, appears as SVT in the 120s-130s (versus sinus tachycardia) I's and O's 1.3 L, -1.9 L, creatinine improved from 2.3 down to 0.7.  VBG at 7 PM yesterday 88% saturation (milrinone drip was stopped earlier that day); VBG today off inotropes at 75%.    Objective:     Medications:  ??? sodium chloride flush  5-40 mL IntraVENous 2 times per day   ??? [Held by provider] apixaban  5 mg Oral BID   ??? [Held by provider] spironolactone  50 mg Oral Daily   ??? [Held by provider] metoprolol tartrate  25 mg Oral BID   ??? [Held by provider] DULoxetine  60 mg Oral Daily   ??? [Held by provider] hydroxychloroquine  400 mg Oral Daily       IV drips:  ??? furosemide (LASIX) 1mg /ml infusion 10 mg/hr (02/14/21 1357)   ??? amiodarone 0.5 mg/min (02/14/21 1238)   ??? sodium chloride     ??? dextrose     ??? dexmedetomidine (PRECEDEX) IV infusion 0.5 mcg/kg/hr (02/14/21 1149)   ??? norepinephrine 3 mcg/min (02/14/21 1233)   ??? propofol 35 mcg/kg/min (02/14/21 1409)   ??? fentaNYL 50 mcg/hr (02/14/21 1253)       PRN:  sodium chloride flush, sodium chloride, acetaminophen, glucose, dextrose bolus **OR** dextrose bolus, glucagon (rDNA), dextrose, hydrOXYzine HCl, albuterol, ondansetron **OR** ondansetron, polyethylene glycol    Vitals:    02/14/21 1130 02/14/21 1145 02/14/21 1200 02/14/21 1315  BP:   100/73    Pulse: (!) 129 (!) 127 (!) 123 90   Resp: 18 18 18 18    Temp:       TempSrc:       SpO2: 97% 96%  97%   Weight:       Height:           Intake/Output Summary (Last 24 hours) at 02/14/2021 1556  Last data filed at 02/14/2021 1400  Gross per 24 hour   Intake --   Output 4765 ml   Net -4765 ml     I/O last 3 completed shifts:  In: 866 [I.V.:866]  Out: 3825 [Urine:3825]  Wt Readings from Last 3 Encounters:   02/14/21 (!) 308 lb 13.8 oz (140.1 kg)   02/01/21 (!) 336 lb (152.4 kg)   01/29/21 (!) 331 lb (150.1 kg)       Admit Wt: Weight: (!) 315 lb (142.9 kg)   Todays  Wt: Weight: (!) 308 lb 13.8 oz (140.1 kg)    TELEMETRY personally reviewed: SVT vs ST    Physical Exam:         General Appearance:  Sedated, intubated.    Head:  Normocephalic, without obvious abnormality, atraumatic   Eyes:  PERRL, conjunctiva/corneas clear EOM intact  Ears normal   Throat no lesions       Nose: Nares normal, no drainage or sinus tenderness   Throat: Lips, mucosa, and tongue normal   Neck: Supple, symmetrical, trachea midline, no adenopathy, thyroid: not enlarged, symmetric, no tenderness/mass/nodules, no carotid bruit.        Lungs:   Normal respiratory rate, lungs with ronchi bilaterally.    Chest Wall:  No tenderness or deformity   Heart:  Regular rhythm, rate is controlled, S1, S2 normal, there is no murmur, there is no rub or gallop, cannot assess jvd, 2+ bilateral lower extremity edema   Abdomen:   Soft, non-tender, bowel sounds active all four quadrants,  no masses, no organomegaly       Extremities: Extremities normal, atraumatic, no cyanosis.    Pulses: 2+ and symmetric   Skin: Skin color, texture, turgor normal, no rashes or lesions   Pysch: Normal mood and affect   Neurologic: Normal gross motor and sensory exam.  Cranial nerves intact       Labs:   Recent Labs     02/12/21  0534 02/12/21  1024 02/12/21  1937 02/13/21  0449 02/13/21  0827 02/13/21  2032 02/14/21  0443   NA  --    < >   < >  --  131* 134* 134*   K  --    < >   < >  --  3.5 3.2* 4.4   BUN  --    < >   < >  --  51* 52* 14   CREATININE  --    < >   < >  --  2.5* 2.3* 0.7   CL  --    < >   < >  --  90* 95* 93*   CO2  --    < >   < >  --  24 22 33*   GLUCOSE  --    < >   < >  --  149* 137* 85   CALCIUM  --    < >   < >  --  8.3 8.3 8.6   MG 2.10  --   --  1.70*  --   --  2.00    < > = values in this interval not displayed.     Recent Labs     02/12/21  1523 02/12/21  1523 02/13/21  0449 02/13/21  0449 02/14/21  0443   WBC 13.2*  --  15.5*  --  11.5*   HGB 13.6  --  12.0  --  12.1   HCT 41.6  --  36.6  --  36.4   PLT 249  --   249  --  230   MCV 92.9   < > 89.8   < > 89.4    < > = values in this interval not displayed.     No results for input(s): CHOLTOT, TRIG, HDL, CHOLHDL, LDL in the last 72 hours.    Invalid input(s): LIPIDCOMM, VLDCHOL  No results for input(s): PTT, INR in the last 72 hours.    Invalid input(s): PT  No results for input(s): CKTOTAL, CKMB, CKMBINDEX, TROPONINI in the last 72 hours.  No results for input(s): BNP in the last 72 hours.  No results for input(s): NTPROBNP in the last 72 hours.  No results for input(s): TSH in the last 72 hours.    Imaging:       Assessment & Plan:     1. ??Acute and new HFrEF. ??It is of unknown etiology, could be ischemic (severe CAD) versus nonischemic (PAF RVR, less likely??postchemotherapy, viral). ??Patient is volume overloaded and hypotensive requiring pressor support.   2. ??Non-Hodgkin's lymphoma status post chemotherapy, now in remission  3. ??SLE  4. ??Rheumatoid arthritis  5. ??PAF RVR. ??Patient appears to be in atrial flutter/coarse atrial fibrillation with RVR. ??It is of unknown duration and she has not been on any anticoagulation. ??She was minimally symptomatic (minimal palpitations). Patient was started on anticoagulation and EP consulted; patient had AFL ablation on 6/24, remained in sinus post procedure.  6.  SVT.  ??  ??  ??  - I suspect hypotension due to high-dose milrinone.  Patient is now off milrinone since 6/25, VBG with normal oxygen saturation suggests adequate cardiac output.  -Cw norepi gtt for BP support, wean as tolerated  -Continue with Lasix 10 mg/h  - Strict I's and O's every shift and standing weights if possible, low-salt diet, daily BMP with reflex to Mg (correct lytes for goals K >4.0 and Mg > 2.0) and wean supplemental oxygen to off (or down to baseline supplemental oxygen requirements) for sats greater than 92-94%.  -Will give 1 dose of digoxin 500 mcg IV x1 now for possible SVT              Due to the high probability of clinically significant life threating  deterioration of the patient's condition that required my urgent intervention, a total critical care time 40 minutes was used. This time excludes any time that may have been spent performing procedures. This includes but not limited to vital sign monitoring, telemetry monitoring, continuous pulse oximety, IV medication, clinical response to the IV medications, documentation time, consultation time, interpretation of lab data, review of nursing notes and old record review.       I have personally reviewed the reports and images of labs, radiological studies, cardiac studies including ECG's and telemetry, current and old medical records. The note was completed using EMR and Dragon dictation system. Every effort was made to ensure accuracy; however, inadvertent computerized transcription errors may be present.    All questions and concerns were  addressed to the patient/family. Alternatives to my treatment were discussed.     Thank you for allowing to [place] to participate in the care or Payden A Lieber. Please call our service with questions.    Laurette Schimke, MD, [name], FHFSA  [facility]  [address]  [address] [place] [address]  Ph: [phone]  Fax: [phone]

## 2021-02-14 NOTE — Progress Notes (Signed)
ICU Progress Note    Admit Date: 02/09/2021    CC Vent support     Interval history:  -NAEO  -Remains on Levo for BP support, Soft but stable w/ acceptable MAPs  -HR Tachy today, 120's   -Afebrile, Down-trending leukocytosis   -Minimal Vent settings, F40 & P5  -Marked renal recovery based on todays labs, Cr of 0.7, UOP of 3.5 L    HPI:  Molly [name] is a 60 yo female with a PMH of non- hodgkin's lymphoma, Systemic lupus, rheumatoid arthritis who presented to the ED with chief complaints of cough and shortness of breath for the past 6 weeks that got worse recently in the past few days.   As per the patient about 6 weeks ago she was having shortness of breath while walking around with decrease in her levels of activity along with cough with white to clear sputum. It was also associated with weight gain of 46 pounds and lower extremity edema. Patient saw her PCP who recommended her to get an ECHO done, results of which came back last night showing an EF of 20%. Patient has been on lasix 40 mg daily and was started on enteresto and aldactone. She does state of having mild difficulty in lying flat in bed and has been using around 1 pillow which is her baseline. Patient was feeling worse this morning, with a cardiology appointment not until next week she decided to come to the hospital for further evaluation.   ??  On 6/24, patient was taken to cath lab for ablation for a flutter. While she lie down on table, she was having increasing respiratory distress and belly breathing, she then desatted and was intubated. She was also give a dose of IV lasix for HF. She was unable to come off the vent and transferred to ICU for medical and vent management.   Medications:     Scheduled Meds:  ??? sodium chloride flush  5-40 mL IntraVENous 2 times per day   ??? [Held by provider] apixaban  5 mg Oral BID   ??? [Held by provider] spironolactone  50 mg Oral Daily   ??? [Held by provider] metoprolol tartrate  25 mg Oral BID   ??? [Held by provider]  DULoxetine  60 mg Oral Daily   ??? [Held by provider] hydroxychloroquine  400 mg Oral Daily     Continuous Infusions:  ??? furosemide (LASIX) 1mg /ml infusion 10 mg/hr (02/14/21 0431)   ??? amiodarone 0.5 mg/min (02/13/21 2145)   ??? sodium chloride     ??? dextrose     ??? dexmedetomidine (PRECEDEX) IV infusion 0.5 mcg/kg/hr (02/14/21 0339)   ??? norepinephrine 4 mcg/min (02/14/21 16100627)   ??? propofol 35 mcg/kg/min (02/14/21 1011)   ??? fentaNYL 50 mcg/hr (02/13/21 1458)     PRN Meds:sodium chloride flush, sodium chloride, acetaminophen, glucose, dextrose bolus **OR** dextrose bolus, glucagon (rDNA), dextrose, hydrOXYzine HCl, albuterol, ondansetron **OR** ondansetron, polyethylene glycol    Objective:   Vitals:   T-max:  Patient Vitals for the past 8 hrs:   BP Temp Temp src Pulse Resp SpO2 Weight   02/14/21 1109 -- -- -- 89 -- -- --   02/14/21 0800 85/65 97.5 ??F (36.4 ??C) Temporal (!) 125 18 96 % --   02/14/21 0750 -- -- -- (!) 126 18 99 % --   02/14/21 0700 -- -- -- (!) 123 -- -- --   02/14/21 0518 -- -- -- -- -- -- (!) 308 lb 13.8 oz (  140.1 kg)   02/14/21 0457 -- -- -- (!) 120 20 96 % --   02/14/21 0400 -- 98.4 ??F (36.9 ??C) Temporal -- -- -- --       Intake/Output Summary (Last 24 hours) at 02/14/2021 1116  Last data filed at 02/14/2021 1000  Gross per 24 hour   Intake 866 ml   Output 3885 ml   Net -3019 ml       Physical Exam  Constitutional:       General: She is not in acute distress.     Comments: Intubated and sedated   HENT:      Head: Normocephalic and atraumatic.      Mouth/Throat:      Mouth: Mucous membranes are moist.      Pharynx: Oropharynx is clear.   Eyes:      Conjunctiva/sclera: Conjunctivae normal.      Pupils: Pupils are equal, round, and reactive to light.   Cardiovascular:      Rate and Rhythm: Normal rate and regular rhythm.      Pulses: Normal pulses.      Heart sounds: Normal heart sounds. No murmur heard.  No friction rub. No gallop.    Pulmonary:      Effort: Pulmonary effort is normal.      Breath sounds:  Normal breath sounds. No wheezing, rhonchi or rales.   Abdominal:      General: Bowel sounds are normal.      Palpations: Abdomen is soft.      Tenderness: There is no abdominal tenderness.   Musculoskeletal:         General: Normal range of motion.      Cervical back: Normal range of motion and neck supple.      Right lower leg: No edema.      Left lower leg: No edema.   Skin:     General: Skin is warm and dry.      Capillary Refill: Capillary refill takes less than 2 seconds.   Neurological:      Comments: Intubated and sedated   Psychiatric:      Comments: Intubated and sedated           LABS:    CBC:   Recent Labs     02/12/21  1523 02/13/21  0449 02/14/21  0443   WBC 13.2* 15.5* 11.5*   HGB 13.6 12.0 12.1   HCT 41.6 36.6 36.4   PLT 249 249 230   MCV 92.9 89.8 89.4     Renal:    Recent Labs     02/12/21  0534 02/12/21  1024 02/12/21  1937 02/13/21  0449 02/13/21  0827 02/13/21  2032 02/14/21  0443   NA  --    < >   < >  --  131* 134* 134*   K  --    < >   < >  --  3.5 3.2* 4.4   CL  --    < >   < >  --  90* 95* 93*   CO2  --    < >   < >  --  24 22 33*   BUN  --    < >   < >  --  51* 52* 14   CREATININE  --    < >   < >  --  2.5* 2.3* 0.7   GLUCOSE  --    < >   < >  --  149* 137* 85   CALCIUM  --    < >   < >  --  8.3 8.3 8.6   MG 2.10  --   --  1.70*  --   --  2.00   ANIONGAP  --    < >   < >  --  17* 17* 8    < > = values in this interval not displayed.     Hepatic:   Recent Labs     02/12/21  1523   AST 86*   ALT 79*   BILITOT 1.9*   PROT 6.6   LABALBU 4.7   ALKPHOS 138*     Troponin:   No results for input(s): TROPONINI in the last 72 hours.  BNP: No results for input(s): BNP in the last 72 hours.  Lipids: No results for input(s): CHOL, HDL in the last 72 hours.    Invalid input(s): LDLCALCU, TRIGLYCERIDE  ABGs:    Recent Labs     02/12/21  1446 02/12/21  1524 02/13/21  0800   PHART 7.173* 7.312* 7.481*   PCO2ART 44.7 39.4 34.4*   PO2ART 446.9* 551.4* 113.0*   HCO3ART 16.4* 19.9* 26   BEART -12* -6* 2.4    O2SATART 100 100 99   TCO2ART 18 21 27        INR:   No results for input(s): INR in the last 72 hours.  Lactate:   Recent Labs     02/12/21  1446 02/12/21  1524   LACTATE 7.22* 6.85*     Cultures:  -----------------------------------------------------------------  RAD:   XR CHEST PORTABLE   Final Result      1. Endotracheal tube in stable position. Right IJ central venous catheter placed with the tip in the distal SVC in satisfactory position   2. Stable cardiac enlargement with no acute segmental consolidation               XR CHEST PORTABLE   Final Result   Impression:       Tip of endotracheal tube is 5 cm the carina.      Lungs are clear. No pneumothorax.      CT CHEST PULMONARY EMBOLISM W CONTRAST   Final Result      1. No evidence of any pulmonary embolism.   2. Four-chamber cardiac enlargement with reflux of contrast into the hepatic veins. This can be seen with elevated right heart pressures.   3. No acute pulmonary process.      XR CHEST PORTABLE   Final Result      Stable cardiac enlargement      No acute consolidation                  Assessment/Plan:   60 yo female with a PMH of NHL, SLE who presented to the ED with chief complaints of cough and shortness of breath for the past 6 weeks that got worse recently in the past few days  ??  Acute Exacerbation of New-Onset HFrEF   Cardiogenic shock  AKI  Recent ECHO on 6/17 with severe dilation of LV, EF of <20%, severe diffuse hypokinesis and grade 3 diastolic dysfunction.  -Lasix gtt @ 10  -Holding Toprol XL ER 25 mg PO daily  -Holding Aldactone 50 mg daily??  -requires ischemic w/u, consider when euvolemic??  -Cards c/s - appreciate recs  -s/p AFL ablation  -On levo  -sedated and intubated on 3 sedatives [Fentanyl, propofol, Precedex]  -UOP 3.5L,  Cr markedly improved    ARF 2/2 acute HFrEF s/p a flutter ablation   -mechanical ventilation and sedation  -FiO2 of 40% & PEEP 5  -Sedation per above    New-Onset Atrial Flutter s/p ablation   Patient endorses SOB  however no chest pain or palpitations. She states she feels anxious. Tachycardic on exam.   Arrived to ED in Atrial Flutter based on EKG obtained at admission. Given Metoprolol 2.5 mg once on admission. ??  -ablation w/ EP 6/24  -Cards??and EP??c/s - reccs appreciated  ??  Anxiety  Patient denies any history of. Appears very anxious this admission. Consider related to Atrial flutter and its ensuing symptoms.  -continue Atarax 10 mg TID PRN  -can spot dose Ativan 0.5 mg for uncontrolled episodes   ??  Chronic Medical Conditions:  SLE - holding Plaquenil 400 mg PO daily, Cymbalta 60 mg PO daily as NPO  OA - holding Relafen    Code Status: FULL  FEN: NPO  PPX: Eliquis (Held)  DISPO: ICU    This patient has been staffed and discussed with Dr Mckinley Jewel.    Maxwell Caul, MD, PGY-1  02/14/21  11:16 AM    Patient seen, examined and discussed with the resident and I agree with the assessment and plan.    Vent Mode: AC/PRVC Resp Rate (Set): 18 bmp/Vt (Set, mL): 500 mL/ /FiO2 : 40 %  PEEP 5  Recent Labs     02/12/21  1524 02/13/21  0800   PHART 7.312* 7.481*   PCO2ART 39.4 34.4*   PO2ART 551.4* 113.0*     Remains tachycardic and did go back into afib yesterday so was started on amiodarone yesterday.  Remains intubated and sedated on propofol, fentanyl and Precedex.  I tried switching her over to spontaneous respirations but she was apneic on the current settings.  I suspect she still alkalotic and we will get a repeat ABG to see if I can decrease the respiratory rate on the vent.  I like to see if we can get her off the ventilator but her tachycardia in the 130s gives me pause.  We may also need to decrease her sedation some.    Spoke with Dr. Harrison Mons of cardiology and he is going to start her on digoxin is unclear if she is in A. fib or SVT, also continuing amiodarone infusion.  If we can get her off the vent and off sedation we may be able to get her off the Levophed which likely is also stimulating her  tachyarrhythmia.    Critical care time spent reviewing labs/films, examining patient, collaborating with other physicians but excluding procedures for life threatening organ failure is 35 minutes.      Maxwell Caul, MD

## 2021-02-14 NOTE — Plan of Care (Signed)
Problem: Discharge Planning  Goal: Discharge to home or other facility with appropriate resources  02/14/2021 2005 by Noralee Space, RN  Outcome: Progressing     Problem: Chronic Conditions and Co-morbidities  Goal: Patient's chronic conditions and co-morbidity symptoms are monitored and maintained or improved  02/14/2021 2005 by Noralee Space, RN  Outcome: Progressing     Problem: Respiratory - Adult  Goal: Achieves optimal ventilation and oxygenation  02/14/2021 2005 by Noralee Space, RN  Outcome: Progressing     Problem: Cardiovascular - Adult  Goal: Maintains optimal cardiac output and hemodynamic stability  02/14/2021 2005 by Noralee Space, RN  Outcome: Progressing     Problem: Cardiovascular - Adult  Goal: Absence of cardiac dysrhythmias or at baseline  Outcome: Progressing  Flowsheets (Taken 02/14/2021 0800 by Letitia Neri, RN)  Absence of cardiac dysrhythmias or at baseline: Assess for signs of decreased cardiac output     Problem: Metabolic/Fluid and Electrolytes - Adult  Goal: Electrolytes maintained within normal limits  02/14/2021 2005 by Noralee Space, RN  Outcome: Progressing     Problem: Metabolic/Fluid and Electrolytes - Adult  Goal: Hemodynamic stability and optimal renal function maintained  Outcome: Progressing     Problem: Skin/Tissue Integrity - Adult  Goal: Skin integrity remains intact  02/14/2021 2005 by Noralee Space, RN  Outcome: Progressing     Problem: Musculoskeletal - Adult  Goal: Return mobility to safest level of function  Outcome: Progressing     Problem: Hematologic - Adult  Goal: Maintains hematologic stability  Outcome: Progressing     Problem: Safety - Adult  Goal: Free from fall injury  Outcome: Progressing     Problem: ABCDS Injury Assessment  Goal: Absence of physical injury  Outcome: Progressing     Problem: Safety - Medical Restraint  Goal: Remains free of injury from restraints (Restraint for Interference with Medical Device)  Description: INTERVENTIONS:  1. Determine that  other, less restrictive measures have been tried or would not be effective before applying the restraint  2. Evaluate the patient's condition at the time of restraint application  3. Inform patient/family regarding the reason for restraint  4. Q2H: Monitor safety, psychosocial status, comfort, nutrition and hydration  02/14/2021 2005 by Noralee Space, RN  Outcome: Progressing     Problem: Pain  Goal: Verbalizes/displays adequate comfort level or baseline comfort level  Outcome: Progressing     Problem: Skin/Tissue Integrity  Goal: Absence of new skin breakdown  Description: 1.  Monitor for areas of redness and/or skin breakdown  2.  Assess vascular access sites hourly  3.  Every 4-6 hours minimum:  Change oxygen saturation probe site  4.  Every 4-6 hours:  If on nasal continuous positive airway pressure, respiratory therapy assess nares and determine need for appliance change or resting period.  Outcome: Progressing

## 2021-02-14 NOTE — Progress Notes (Signed)
Hospitalist Progress Note      PCP: Lindell Spar, APRN - CNP    Date of Admission: 02/09/2021    Chief Complaint: Shortness of breath     Hospital Course: see H&P     Subjective: seen and examined at bedside.   Sister-in-Law at bedside.  Remain intubated and sedated.  Afebrile but sinus tachy in 120s  Still need levo for pressor support.  Cr 0.7 today, significantly improved on Lasix ggt     Medications:  Reviewed    Infusion Medications   ??? furosemide (LASIX) 1mg /ml infusion 10 mg/hr (02/14/21 1357)   ??? amiodarone 0.5 mg/min (02/14/21 1238)   ??? sodium chloride     ??? dextrose     ??? dexmedetomidine (PRECEDEX) IV infusion 0.5 mcg/kg/hr (02/14/21 1149)   ??? norepinephrine 3 mcg/min (02/14/21 1233)   ??? propofol 35 mcg/kg/min (02/14/21 1409)   ??? fentaNYL 50 mcg/hr (02/14/21 1253)     Scheduled Medications   ??? sodium chloride flush  5-40 mL IntraVENous 2 times per day   ??? [Held by provider] apixaban  5 mg Oral BID   ??? [Held by provider] spironolactone  50 mg Oral Daily   ??? [Held by provider] metoprolol tartrate  25 mg Oral BID   ??? [Held by provider] DULoxetine  60 mg Oral Daily   ??? [Held by provider] hydroxychloroquine  400 mg Oral Daily     PRN Meds: sodium chloride flush, sodium chloride, acetaminophen, glucose, dextrose bolus **OR** dextrose bolus, glucagon (rDNA), dextrose, hydrOXYzine HCl, albuterol, ondansetron **OR** ondansetron, polyethylene glycol      Intake/Output Summary (Last 24 hours) at 02/14/2021 1622  Last data filed at 02/14/2021 1600  Gross per 24 hour   Intake 3065 ml   Output 5275 ml   Net -2210 ml       Physical Exam Performed:    BP 100/66    Pulse 71    Temp 98.6 ??F (37 ??C) (Temporal)    Resp 18    Ht 5\' 11"  (1.803 m)    Wt (!) 308 lb 13.8 oz (140.1 kg)    SpO2 97%    BMI 43.08 kg/m??     General appearance: No apparent distress, appears stated age and cooperative.  HEENT: Pupils equal, round, and reactive to light. Conjunctivae/corneas clear.  Neck: Supple, with full range of motion. No  jugular venous distention. Trachea midline.  Respiratory:  Normal respiratory effort. Clear to auscultation, bilaterally without Rales/Wheezes/Rhonchi.  Cardiovascular: Regular rate and rhythm with normal S1/S2 without murmurs, rubs or gallops.  Abdomen: Soft, non-tender, non-distended with normal bowel sounds.  Musculoskeletal: No clubbing, cyanosis or edema bilaterally.  Full range of motion without deformity.  Skin: Skin color, texture, turgor normal.  No rashes or lesions.  Neurologic:  Neurovascularly intact without any focal sensory/motor deficits. Cranial nerves: II-XII intact, grossly non-focal.  Psychiatric: Alert and oriented, thought content appropriate, normal insight  Capillary Refill: Brisk,3 seconds, normal   Peripheral Pulses: +2 palpable, equal bilaterally       Labs:   Recent Labs     02/12/21  1523 02/13/21  0449 02/14/21  0443   WBC 13.2* 15.5* 11.5*   HGB 13.6 12.0 12.1   HCT 41.6 36.6 36.4   PLT 249 249 230     Recent Labs     02/13/21  0827 02/13/21  2032 02/14/21  0443   NA 131* 134* 134*   K 3.5 3.2* 4.4   CL 90* 95* 93*  CO2 24 22 33*   BUN 51* 52* 14   CREATININE 2.5* 2.3* 0.7   CALCIUM 8.3 8.3 8.6     Recent Labs     02/12/21  1523   AST 86*   ALT 79*   BILITOT 1.9*   ALKPHOS 138*     No results for input(s): INR in the last 72 hours.  No results for input(s): CKTOTAL, TROPONINI in the last 72 hours.    Urinalysis:      Lab Results   Component Value Date    NITRU Negative 02/09/2021    WBCUA 3-5 02/09/2021    BACTERIA RARE 04/19/2017    RBCUA 3-4 02/09/2021    BLOODU Negative 02/09/2021    SPECGRAV 1.015 02/09/2021    GLUCOSEU Negative 02/09/2021       Radiology:  XR CHEST PORTABLE   Final Result      1. Endotracheal tube in stable position. Right IJ central venous catheter placed with the tip in the distal SVC in satisfactory position   2. Stable cardiac enlargement with no acute segmental consolidation               XR CHEST PORTABLE   Final Result   Impression:       Tip of  endotracheal tube is 5 cm the carina.      Lungs are clear. No pneumothorax.      CT CHEST PULMONARY EMBOLISM W CONTRAST   Final Result      1. No evidence of any pulmonary embolism.   2. Four-chamber cardiac enlargement with reflux of contrast into the hepatic veins. This can be seen with elevated right heart pressures.   3. No acute pulmonary process.      XR CHEST PORTABLE   Final Result      Stable cardiac enlargement      No acute consolidation                    Assessment/Plan:    Active Hospital Problems    Diagnosis    ??? Atrial flutter with rapid ventricular response (HCC) [I48.92]      Priority: Medium   ??? Dilated cardiomyopathy (HCC) [I42.0]      Priority: Medium   ??? Acute respiratory failure with hypoxia (HCC) [J96.01]      Priority: Medium   ??? Lactic acidosis [E87.2]      Priority: Medium   ??? CHF (congestive heart failure), NYHA class I, acute on chronic, combined (HCC) [I50.43]      Priority: Medium   ??? Shortness of breath [R06.02]      Priority: Medium     60 yo female with a PMH of NHL, SLE who presented to the ED with chief complaints of cough and shortness of breath for the past 6 weeks that got worse recently in the past few days  ??  Acute Exacerbation of New-Onset HFrEF   Cardiogenic shock  Recent ECHO on 6/17 with severe dilation of LV, EF of <20%, severe diffuse hypokinesis and grade 3 diastolic dysfunction.  - On Lasix gtt;   - On Levophed  -Off Milrinone since 6/25  -on Amiodarone gtt  - Holding Toprol XL ER 25 mg PO daily  - requires ischemic w/u, consider when euvolemic??  - Cards following - appreciate recs  - sedated and intubated on 3 sedatives [Fentanyl, propofol, Precedex] which may be contributing to hypotension as well   ??  Acute hypoxic respiratory failure 2/2 acute  HFrEF s/p a flutter ablation (6/24)  -mechanical ventilation??and sedation  -Pressors/inotropes as above  -on Amiodarone gtt  - Holding Toprol XL ER 25 mg PO daily  -cont to monitor  ??  New-Onset Atrial Flutter??s/p  ablation??(6/24)??  -??ablation w/ EP 6/24  -on Amiodarone gtt  - Holding Toprol XL ER 25 mg PO daily  - Cards??and EP??c/s - reccs appreciated    AKI  -likely 2/2 hypoperfusion due to cardiogenic shock  -Cr 2.3 today , Cr baseline 1  -Avoid nephrotoxic medications    ????  Chronic Medical Conditions:  SLE - holding Plaquenil 400 mg PO daily, Cymbalta 60 mg PO daily as NPO  OA - holding Relafen  Morbid obesity BMI 43, complicates overall care  Non-Hodgkin's lymphoma status postchemo 2006-2007    DVT Prophylaxis: Eliquis  Diet: Diet NPO  Code Status: Full Code    PT/OT Eval Status: will order when extubated    Dispo -ICU    Nehemiah Massed, MD

## 2021-02-14 NOTE — Plan of Care (Signed)
Problem: Discharge Planning  Goal: Discharge to home or other facility with appropriate resources  Outcome: Not Progressing     Problem: Chronic Conditions and Co-morbidities  Goal: Patient's chronic conditions and co-morbidity symptoms are monitored and maintained or improved  Outcome: Progressing     Problem: Respiratory - Adult  Goal: Achieves optimal ventilation and oxygenation  Outcome: Progressing     Problem: Cardiovascular - Adult  Goal: Maintains optimal cardiac output and hemodynamic stability  Outcome: Progressing  Flowsheets (Taken 02/14/2021 0800)  Maintains optimal cardiac output and hemodynamic stability: Monitor blood pressure and heart rate  Goal: Absence of cardiac dysrhythmias or at baseline  Recent Flowsheet Documentation  Taken 02/14/2021 0800 by Letitia Neri, RN  Absence of cardiac dysrhythmias or at baseline: Assess for signs of decreased cardiac output     Problem: Metabolic/Fluid and Electrolytes - Adult  Goal: Electrolytes maintained within normal limits  Outcome: Progressing     Problem: Skin/Tissue Integrity - Adult  Goal: Skin integrity remains intact  Outcome: Progressing     Problem: Safety - Medical Restraint  Goal: Remains free of injury from restraints (Restraint for Interference with Medical Device)  Description: INTERVENTIONS:  1. Determine that other, less restrictive measures have been tried or would not be effective before applying the restraint  2. Evaluate the patient's condition at the time of restraint application  3. Inform patient/family regarding the reason for restraint  4. Q2H: Monitor safety, psychosocial status, comfort, nutrition and hydration  Outcome: Progressing

## 2021-02-14 NOTE — Progress Notes (Signed)
Patient UOP remains below goal; bladder scanned per residents; 77ml resulting. Residents notified; will continue to monitor.

## 2021-02-15 ENCOUNTER — Inpatient Hospital Stay: Admit: 2021-02-16 | Payer: MEDICAID | Primary: Gerontology

## 2021-02-15 LAB — CBC
Hematocrit: 41.9 % (ref 36.0–48.0)
Hemoglobin: 13.7 g/dL (ref 12.0–16.0)
MCH: 29.3 pg (ref 26.0–34.0)
MCHC: 32.6 g/dL (ref 31.0–36.0)
MCV: 89.9 fL (ref 80.0–100.0)
MPV: 8.5 fL (ref 5.0–10.5)
Platelets: 220 10*3/uL (ref 135–450)
RBC: 4.66 M/uL (ref 4.00–5.20)
RDW: 15 % (ref 12.4–15.4)
WBC: 12.5 10*3/uL — ABNORMAL HIGH (ref 4.0–11.0)

## 2021-02-15 LAB — CBC WITH AUTO DIFFERENTIAL
Basophils %: 0.2 %
Basophils Absolute: 0 10*3/uL (ref 0.0–0.2)
Eosinophils %: 0.4 %
Eosinophils Absolute: 0 10*3/uL (ref 0.0–0.6)
Hematocrit: 40.5 % (ref 36.0–48.0)
Hemoglobin: 13 g/dL (ref 12.0–16.0)
Lymphocytes %: 11.8 %
Lymphocytes Absolute: 1.3 10*3/uL (ref 1.0–5.1)
MCH: 29 pg (ref 26.0–34.0)
MCHC: 32.1 g/dL (ref 31.0–36.0)
MCV: 90.3 fL (ref 80.0–100.0)
MPV: 8.2 fL (ref 5.0–10.5)
Monocytes %: 12.3 %
Monocytes Absolute: 1.4 10*3/uL — ABNORMAL HIGH (ref 0.0–1.3)
Neutrophils %: 75.3 %
Neutrophils Absolute: 8.3 10*3/uL — ABNORMAL HIGH (ref 1.7–7.7)
Platelets: 226 10*3/uL (ref 135–450)
RBC: 4.48 M/uL (ref 4.00–5.20)
RDW: 14.9 % (ref 12.4–15.4)
WBC: 11.1 10*3/uL — ABNORMAL HIGH (ref 4.0–11.0)

## 2021-02-15 LAB — BLOOD GAS, ARTERIAL
Base Excess, Arterial: 8.2 mmol/L — ABNORMAL HIGH (ref <=3.0)
Carboxyhgb, Arterial: 0.9 % (ref 0.0–1.5)
HCO3, Arterial: 33 mmol/L — ABNORMAL HIGH (ref 21–29)
Hemoglobin, Art, Extended: 18.6 g/dL
Methemoglobin, Arterial: 0 % (ref 0.0–1.4)
O2 Sat, Arterial: 99 % (ref 93–100)
TCO2, Arterial: 35 mmol/L
pCO2, Arterial: 44.9 mmHg (ref 35.0–45.0)
pH, Arterial: 7.478 — ABNORMAL HIGH (ref 7.350–7.450)
pO2, Arterial: 124 mmHg — ABNORMAL HIGH (ref 75.0–108.0)

## 2021-02-15 LAB — BASIC METABOLIC PANEL
Anion Gap: 13 (ref 3–16)
BUN: 40 mg/dL — ABNORMAL HIGH (ref 7–20)
CO2: 30 mmol/L (ref 21–32)
Calcium: 7.8 mg/dL — ABNORMAL LOW (ref 8.3–10.6)
Chloride: 95 mmol/L — ABNORMAL LOW (ref 99–110)
Creatinine: 1.5 mg/dL — ABNORMAL HIGH (ref 0.6–1.1)
GFR African American: 43 — AB (ref >60)
GFR Non-African American: 36 — AB (ref >60)
Glucose: 105 mg/dL — ABNORMAL HIGH (ref 70–99)
Potassium: 3.7 mmol/L (ref 3.5–5.1)
Sodium: 138 mmol/L (ref 136–145)

## 2021-02-15 LAB — PROTIME-INR
INR: 1.23 — ABNORMAL HIGH (ref 0.87–1.14)
Protime: 15.4 s — ABNORMAL HIGH (ref 11.7–14.5)

## 2021-02-15 LAB — APTT: aPTT: 29 s (ref 23.0–34.3)

## 2021-02-15 LAB — BRAIN NATRIURETIC PEPTIDE: Pro-BNP: 1857 pg/mL — ABNORMAL HIGH (ref 0–124)

## 2021-02-15 MED ORDER — POTASSIUM CHLORIDE 20 MEQ/50ML IV SOLN
20 MEQ/50ML | INTRAVENOUS | Status: AC
Start: 2021-02-15 — End: 2021-02-15
  Administered 2021-02-15 (×2): 20 meq via INTRAVENOUS

## 2021-02-15 MED ORDER — HEPARIN SODIUM (PORCINE) 1000 UNIT/ML IJ SOLN
1000 UNIT/ML | INTRAMUSCULAR | Status: DC | PRN
Start: 2021-02-15 — End: 2021-02-24

## 2021-02-15 MED ORDER — PHENOL 1.4 % MT LIQD
1.4 % | OROMUCOSAL | Status: DC | PRN
Start: 2021-02-15 — End: 2021-02-24
  Administered 2021-02-15: 19:00:00 1 via OROMUCOSAL

## 2021-02-15 MED ORDER — HEPARIN SOD (PORCINE) IN D5W 100 UNIT/ML IV SOLN
100 UNIT/ML | INTRAVENOUS | Status: DC
Start: 2021-02-15 — End: 2021-02-16
  Administered 2021-02-15 – 2021-02-16 (×2): 15 [IU]/kg/h via INTRAVENOUS

## 2021-02-15 MED FILL — POTASSIUM CHLORIDE 20 MEQ/50ML IV SOLN: 20 MEQ/50ML | INTRAVENOUS | Qty: 50

## 2021-02-15 MED FILL — FUROSEMIDE 10 MG/ML IJ SOLN: 10 mg/mL | INTRAMUSCULAR | Qty: 10

## 2021-02-15 MED FILL — HEPARIN SOD (PORCINE) IN D5W 100 UNIT/ML IV SOLN: 100 [IU]/mL | INTRAVENOUS | Qty: 250

## 2021-02-15 MED FILL — DIPRIVAN 1000 MG/100ML IV EMUL: 1000 MG/100ML | INTRAVENOUS | Qty: 100

## 2021-02-15 MED FILL — DEXMEDETOMIDINE HCL 200 MCG/2ML IV SOLN: 200 MCG/2ML | INTRAVENOUS | Qty: 400

## 2021-02-15 MED FILL — AMIODARONE HCL 150 MG/3ML IV SOLN: 150 MG/3ML | INTRAVENOUS | Qty: 6

## 2021-02-15 MED FILL — FENTANYL CITRATE 1000 MCG/100ML IV SOLN: 1000 MCG/100ML | INTRAVENOUS | Qty: 100

## 2021-02-15 MED FILL — PHENASEPTIC 1.4 % MT LIQD: 1.4 % | OROMUCOSAL | Qty: 177

## 2021-02-15 NOTE — Care Coordination-Inpatient (Signed)
Case Management Assessment           Daily Note                 Date/ Time of Note: 02/15/2021 11:11 AM         Note completed by: Christa See, RN    Patient Name: Molly [name]  Date of Birth: 22-Mar-1961    Diagnosis:Shortness of breath [R06.02]  CHF (congestive heart failure), NYHA class I, acute on chronic, combined (HCC) [I50.43]  Congestive heart failure, unspecified HF chronicity, unspecified heart failure type (HCC) [I50.9]  Patient Admission Status: Inpatient    Date of Admission:02/09/2021  6:11 PM Length of Stay: 6 GLOS: GMLOS: 4.3    Current Plan of Care: Intubated and sedated s/p abation on 6/24. Vent settings FiO2 40 & PEEP 5. Vent Day 3. SBT today. Levo gtt. NPO  _____________________________________________________  Discharge Plan: The original plan was to return home. Will re-assess following PT/OT evaluations and recommendations    Case Management Notes:  Patient is from home with spouse, completely independent pta.  CM will continue to follow for discharge needs.    Molly [name] and her family were provided with choice of provider; she and her family are in agreement with the discharge plan.    Care Transition Patient: No    Christa See, RN  [name]  Case Management Department  [phone]

## 2021-02-15 NOTE — Other (Signed)
RT Nebulizer Bronchodilator Protocol Note    There is a bronchodilator order in the chart from a provider indicating to follow the RT Bronchodilator Protocol and there is an ???Initiate RT Bronchodilator Protocol??? order as well (see protocol at bottom of note).    CXR Findings:  No results found.    The findings from the last RT Protocol Assessment were as follows:  Smoking: None or smoker <15 pack years  Respiratory Pattern: Regular pattern and RR 12-20 bpm  Breath Sounds: Slightly diminished and/or crackles  Cough: Strong, productive  Indication for Bronchodilator Therapy: On home bronchodilators  Bronchodilator Assessment Score: 3    Aerosolized bronchodilator medication orders have been revised according to the RT Nebulizer Bronchodilator Protocol below.    Respiratory Therapist to perform RT Therapy Protocol Assessment initially then follow the protocol.  Repeat RT Therapy Protocol Assessment PRN for score 0-3 or on second treatment, BID, and PRN for scores above 3.    No Indications - adjust the frequency to every 6 hours PRN wheezing or bronchospasm, if no treatments needed after 48 hours then discontinue using Per Protocol order mode.     If indication present, adjust the RT bronchodilator orders based on the Bronchodilator Assessment Score as indicated below.  If a patient is on this medication at home then do not decrease Frequency below that used at home.    0-3 - enter or revise RT bronchodilator order(s) to equivalent RT Bronchodilator order with Frequency of every 4 hours PRN for wheezing or increased work of breathing using Per Protocol order mode.       4-6 - enter or revise RT Bronchodilator order(s) to two equivalent RT bronchodilator orders with one order with BID Frequency and one order with Frequency of every 4 hours PRN wheezing or increased work of breathing using Per Protocol order mode.         7-10 - enter or revise RT Bronchodilator order(s) to two equivalent RT bronchodilator orders with  one order with TID Frequency and one order with Frequency of every 4 hours PRN wheezing or increased work of breathing using Per Protocol order mode.       11-13 - enter or revise RT Bronchodilator order(s) to one equivalent RT bronchodilator order with QID Frequency and an Albuterol order with Frequency of every 4 hours PRN wheezing or increased work of breathing using Per Protocol order mode.      Greater than 13 - enter or revise RT Bronchodilator order(s) to one equivalent RT bronchodilator order with every 4 hours Frequency and an Albuterol order with Frequency of every 2 hours PRN wheezing or increased work of breathing using Per Protocol order mode.     RT to enter RT Home Evaluation for COPD & MDI Assessment order using Per Protocol order mode.    Electronically signed by Randolm Idol, RCP on 02/15/2021 at 3:10 PM

## 2021-02-15 NOTE — Progress Notes (Signed)
Hospitalist Progress Note      PCP: Lindell Spar, APRN - CNP    Date of Admission: 02/09/2021    Chief Complaint: Shortness of breath     Hospital Course: see H&P     Subjective: seen and examined at bedside.   Remain intubated and sedated.  Afebrile. VS. Tachycardia resolved  Still need levo for pressor support but weaning down. On Lasix and Amiodarone gtt.  Brother and sister-in- law at bedside.  Per nurse, when sedation vocation, patient moves 4 extremities.      Medications:  Reviewed    Infusion Medications   ??? heparin (PORCINE) Infusion     ??? furosemide (LASIX) 1mg /ml infusion 10 mg/hr (02/15/21 0826)   ??? amiodarone 0.5 mg/min (02/15/21 0405)   ??? sodium chloride     ??? dextrose     ??? dexmedetomidine (PRECEDEX) IV infusion 0.5 mcg/kg/hr (02/15/21 0745)   ??? norepinephrine 2 mcg/min (02/14/21 1630)   ??? propofol 30 mcg/kg/min (02/15/21 02/17/21)   ??? fentaNYL 50 mcg/hr (02/14/21 1253)     Scheduled Medications   ??? sodium chloride flush  5-40 mL IntraVENous 2 times per day   ??? [Held by provider] apixaban  5 mg Oral BID   ??? [Held by provider] spironolactone  50 mg Oral Daily   ??? [Held by provider] metoprolol tartrate  25 mg Oral BID   ??? [Held by provider] DULoxetine  60 mg Oral Daily   ??? [Held by provider] hydroxychloroquine  400 mg Oral Daily     PRN Meds: heparin (porcine), heparin (porcine), phenol, sodium chloride flush, sodium chloride, acetaminophen, glucose, dextrose bolus **OR** dextrose bolus, glucagon (rDNA), dextrose, hydrOXYzine HCl, albuterol, ondansetron **OR** ondansetron, polyethylene glycol      Intake/Output Summary (Last 24 hours) at 02/15/2021 1244  Last data filed at 02/15/2021 1157  Gross per 24 hour   Intake 3065 ml   Output 4900 ml   Net -1835 ml       Physical Exam Performed:    BP 98/67    Pulse 94    Temp 97.9 ??F (36.6 ??C) (Temporal)    Resp 21    Ht 5\' 11"  (1.803 m)    Wt (!) 308 lb 10.3 oz (140 kg)    SpO2 95%    BMI 43.05 kg/m??     General appearance: No apparent distress,  intubated and sedated  HEENT: Pupils equal, round, and reactive to light. Conjunctivae/corneas clear.  Neck: Supple, with full range of motion. No jugular venous distention. Trachea midline.  Respiratory:  Normal respiratory effort. Clear to auscultation, bilaterally without Rales/Wheezes/Rhonchi.  Cardiovascular: Regular rate and rhythm with normal S1/S2 without murmurs, rubs or gallops.  Abdomen: Soft, non-tender, non-distended with normal bowel sounds.  Musculoskeletal: No clubbing, cyanosis or edema bilaterally.  Full range of motion without deformity.  Skin: Skin color, texture, turgor normal.  No rashes or lesions.  Neurologic:  Neurovascularly intact without any focal sensory/motor deficits. Cranial nerves: II-XII intact, grossly non-focal.  Psychiatric:sedated  Capillary Refill: Brisk,3 seconds, normal   Peripheral Pulses: +2 palpable, equal bilaterally       Labs:   Recent Labs     02/14/21  0443 02/15/21  0422 02/15/21  1117   WBC 11.5* 11.1* 12.5*   HGB 12.1 13.0 13.7   HCT 36.4 40.5 41.9   PLT 230 226 220     Recent Labs     02/14/21  1729 02/14/21  1842 02/15/21  0749   NA 143  135* 138   K 2.0* 3.5 3.7   CL 116* 94* 95*   CO2 17* 26 30   BUN 30* 47* 40*   CREATININE 0.9 1.8* 1.5*   CALCIUM 4.1* 8.0* 7.8*   PHOS 2.4* 4.4  --      Recent Labs     02/12/21  1523   AST 86*   ALT 79*   BILITOT 1.9*   ALKPHOS 138*     Recent Labs     02/15/21  1117   INR 1.23*     No results for input(s): CKTOTAL, TROPONINI in the last 72 hours.    Urinalysis:      Lab Results   Component Value Date    NITRU Negative 02/09/2021    WBCUA 3-5 02/09/2021    BACTERIA RARE 04/19/2017    RBCUA 3-4 02/09/2021    BLOODU Negative 02/09/2021    SPECGRAV 1.015 02/09/2021    GLUCOSEU Negative 02/09/2021       Radiology:  XR CHEST PORTABLE   Final Result      1. Endotracheal tube in stable position. Right IJ central venous catheter placed with the tip in the distal SVC in satisfactory position   2. Stable cardiac enlargement with no acute  segmental consolidation               XR CHEST PORTABLE   Final Result   Impression:       Tip of endotracheal tube is 5 cm the carina.      Lungs are clear. No pneumothorax.      CT CHEST PULMONARY EMBOLISM W CONTRAST   Final Result      1. No evidence of any pulmonary embolism.   2. Four-chamber cardiac enlargement with reflux of contrast into the hepatic veins. This can be seen with elevated right heart pressures.   3. No acute pulmonary process.      XR CHEST PORTABLE   Final Result      Stable cardiac enlargement      No acute consolidation                Assessment/Plan:    Active Hospital Problems    Diagnosis    ??? Atrial flutter with rapid ventricular response (HCC) [I48.92]      Priority: Medium   ??? Dilated cardiomyopathy (HCC) [I42.0]      Priority: Medium   ??? Acute respiratory failure with hypoxia (HCC) [J96.01]      Priority: Medium   ??? Lactic acidosis [E87.2]      Priority: Medium   ??? CHF (congestive heart failure), NYHA class I, acute on chronic, combined (HCC) [I50.43]      Priority: Medium   ??? Shortness of breath [R06.02]      Priority: Medium     60 yo female with a PMH of NHL, SLE who presented to the ED with chief complaints of cough and shortness of breath for the past 6 weeks that got worse recently in the past few days  ??  Acute Exacerbation of New-Onset HFrEF   Cardiogenic shock  Recent ECHO on 6/17 with severe dilation of LV, EF of <20%, severe diffuse hypokinesis and grade 3 diastolic dysfunction.  - On Lasix gtt;   - On Levophed, weaning down  -Off Milrinone since 6/25  -on Amiodarone gtt  - Holding Toprol XL ER 25 mg PO daily  - requires ischemic w/u, consider when euvolemic??  - Cards following - appreciate recs    ??  Acute hypoxic respiratory failure 2/2 acute HFrEF s/p a flutter ablation (6/24)  -mechanical ventilation??and sedation  -Pressors/inotropes as above  -on Amiodarone gtt  - Holding Toprol XL ER 25 mg PO daily  -cont to monitor  ??  New-Onset Atrial Flutter??s/p  ablation??(6/24)??  -??ablation w/ EP 6/24  -on Amiodarone gtt  - Holding Toprol XL ER 25 mg PO daily  - Cards??and EP??c/s - reccs appreciated    AKI  -likely 2/2 hypoperfusion due to cardiogenic shock  -Cr 1.8 today , Cr baseline 1  -Avoid nephrotoxic medications    ????  Chronic Medical Conditions:  SLE - holding Plaquenil 400 mg PO daily, Cymbalta 60 mg PO daily as NPO  OA - holding Relafen  Morbid obesity BMI 43, complicates overall care  Non-Hodgkin's lymphoma status postchemo 2006-2007    DVT Prophylaxis: Eliquis  Diet: Diet NPO  Code Status: Full Code    PT/OT Eval Status: will order when extubated    Dispo -ICU    Nehemiah Massed, MD

## 2021-02-15 NOTE — Progress Notes (Signed)
Comprehensive Nutrition Assessment    RECOMMENDATIONS:  1. PO Diet: Continue NPO while intubated  Nutrition Support: EN recs below as needed   ?? Recommend EN formula Peptide-Based High Protein  Vital High Protein  @ goal rate 50 ml/hr   ?? Initiate EN @ 20m/hr and as tolerated, increase by 15 mL/hr q 4 hours until goal of 532mhr is met.   ?? Recommend water bolus 14056mvery 4 hours or defer to MD    NUTRITION ASSESSMENT:   Nutritional summary & status: New Vent: Pt intubated and sedated on propofol; levo titrating down. Pt endorsed poor appetite prior to being intubated & oral intakes were declining with <50% PO intake of meals. Provided EN recs above as needed if pt unable to extubate. Will continue to monitor nutritional status and plan of care throughout adm.    Admission/PMH: Admit for SOB, acute exacerbation of new onset HFrEF; PMH of non- hodgkin's lymphoma, Systemic lupus, rheumatoid arthritis     MALNUTRITION ASSESSMENT  Context of Malnutrition: Acute Illness   Malnutrition Status: At risk for malnutrition (Comment)  Findings of the 6 clinical characteristics of malnutrition (Minimum of 2 out of 6 clinical characteristics is required to make the diagnosis of moderate or severe Protein Calorie Malnutrition based on AND/ASPEN Guidelines):  Energy Intake: Less than/equal to 75% of estimated energy requirements    Energy Intake Time: x 3 days     NUTRITION DIAGNOSIS   Inadequate oral intake related to impaired respiratory function as evidenced by NPO or clear liquid status due to medical condition,intubation    NUTBeardend/or Nutrient Delivery:  Continue NPO,Start Tube Feeding  Nutrition Education/Counseling:  No recommendation at this time   Goals:  pt will tolerate the most appropriate form of nutrition to meet >75% energy needs         Nutrition Monitoring and Evaluation:   Food/Nutrient Intake Outcomes:  Diet Advancement/Tolerance,Enteral Nutrition Intake/Tolerance  Physical  Signs/Symptoms Outcomes:  Nutrition Focused Physical Findings,Biochemical Data     OBJECTIVE DATA: Significant to nutrition assessment  ?? Nutrition-Focused Physical Findings: lbm 6/24  ?? Labs: Reviewed;   ?? Meds: Reviewed; lasix, propofol, levophed, precedex  ?? Wounds:  (femoral puncture)       CURRENT NUTRITION THERAPIES  Diet NPO     PO Intake: NPO   PO Supplement Intake:NPO  Additional Sources of Calories/IVF:propofol @ 25.1ml53m=663kcal     ANTHROPOMETRICS  Current Height: 5' 11"  (180.3 cm)  Current Weight: (!) 308 lb 10.3 oz (140 kg)    Admission weight: (!) 315 lb (142.9 kg)  Ideal Body Weight (IBW): 155 lbs  (70 kg)    Usual Bodyweight   UTA  Weight Changes wt fluctuates noting diuretics & CHF      BMI: 43.1    Wt Readings from Last 50 Encounters:   02/15/21 (!) 308 lb 10.3 oz (140 kg)   02/01/21 (!) 336 lb (152.4 kg)   01/29/21 (!) 331 lb (150.1 kg)   12/30/20 (!) 320 lb (145.2 kg)     COMPARATIVE STANDARDS  Energy (kcal):  1540-1960     Protein (g):  84-105       Fluid (ml/day):  1540-1960    The patient will still be monitored per nutrition standards of care.  Consult dietitian if nutrition interventions essential to patient care is needed.     PariLouretta Parma, [place], [place]  [employer]:  [phone] 1915[employer]:  [phone]

## 2021-02-15 NOTE — Progress Notes (Signed)
Cardiology Consult Service  Daily Progress Note        Admit Date:  02/09/2021  Primary cardiologist: Dr Harrison Mons / Dr. Clarise Cruz    Reason for Consultation/Chief Complaint: AHF, PAFRVR    Subjective:     Molly [name] is a 60 y.o. female with a past medical history of morbid obesity (BMI 43), non-Hodgkin's lymphoma status postchemotherapy 2006- 2007, SLE, RA.  Patient denies any previous cardiac history.  Patient reports she had MUGA test prior to chemotherapy and during treatment, last MUGA test was 2 years after her last chemotherapy and cardiac function was reportedly normal.  ??  Patient presented to the emergency room on 6/21 with progressively worse shortness of breath, lower extremity edema, weight gain, fatigue over the last 6 weeks.  She admits to occasional fluttering in her chest which is very mild.  At the emergency room she was afebrile, tachycardic in AF RVR/atrial flutter RVR, hypertensive, did not require any supplemental oxygen.  Electrolytes normal, proBNP 4700, troponin x1 negative, CBC normal, TSH normal, chest x-ray unremarkable, CTA chest negative for PE, significant biventricular dilatation consistent with cardiomyopathy.  ??  ECG 02/09/2021: Atrial flutter/coarse A. fib with RVR 138 bpm.  ??  Patient was admitted for acute heart failure and was placed on Lasix drip 10 mg an hour after receiving Lasix 60 mg IV x1.  Today I's and O's -3 L, weight down 2 pounds, creatinine stable 1.9, K3.4, magnesium 1.9.  Telemetry was personally reviewed, reveals A. fib with RVR 110-120 bpm.  ??  Echo 02/05/2021: LVD 6.8 cm, LVEF less than 20%, diastolic grade 3, increased LVEDP, severe RV dilatation and dysfunction, moderate MR/TR, RVSP 40 (15).  Images were personally reviewed.  ??  S/p atrial flutter ablation 6/24.  Patient could not be extubated after the procedure due to hypoxia and therefore was transferred intubated to the ICU.    Interval history:  Patient remains intubated with weaning sedation.  Telemetry  was personally reviewed, patient is now in sinus rhythm 80 bpm after receiving digoxin 500 mics IV x1 yesterday.  She is now off norepinephrine drip with borderline low BP.  Creatinine improving at 1.5 from 1.8.  I's and O's -2.2 L, -1.2 L.    Objective:     Medications:  ??? sodium chloride flush  5-40 mL IntraVENous 2 times per day   ??? [Held by provider] apixaban  5 mg Oral BID   ??? [Held by provider] spironolactone  50 mg Oral Daily   ??? [Held by provider] metoprolol tartrate  25 mg Oral BID   ??? [Held by provider] DULoxetine  60 mg Oral Daily   ??? [Held by provider] hydroxychloroquine  400 mg Oral Daily       IV drips:  ??? heparin (PORCINE) Infusion 15 Units/kg/hr (02/15/21 1314)   ??? furosemide (LASIX) 1mg /ml infusion 10 mg/hr (02/15/21 0826)   ??? amiodarone 0.5 mg/min (02/15/21 0405)   ??? sodium chloride     ??? dextrose     ??? dexmedetomidine (PRECEDEX) IV infusion Stopped (02/15/21 1210)   ??? norepinephrine 2 mcg/min (02/14/21 1630)   ??? propofol Stopped (02/15/21 0940)   ??? fentaNYL Stopped (02/15/21 1015)       PRN:  heparin (porcine), heparin (porcine), phenol, sodium chloride flush, sodium chloride, acetaminophen, glucose, dextrose bolus **OR** dextrose bolus, glucagon (rDNA), dextrose, hydrOXYzine HCl, albuterol, ondansetron **OR** ondansetron, polyethylene glycol    Vitals:    02/15/21 1010 02/15/21 1012 02/15/21 1013 02/15/21 1200   BP:  105/73   Pulse: 89 88 94 99   Resp: 15 17 21 19    Temp:    97.9 ??F (36.6 ??C)   TempSrc:    Temporal   SpO2: 96% 94% 95% 98%   Weight:       Height:           Intake/Output Summary (Last 24 hours) at 02/15/2021 1420  Last data filed at 02/15/2021 1157  Gross per 24 hour   Intake --   Output 4500 ml   Net -4500 ml     I/O last 3 completed shifts:  In: 3065 [I.V.:3065]  Out: 7275 [Urine:7275]  Wt Readings from Last 3 Encounters:   02/15/21 (!) 308 lb 10.3 oz (140 kg)   02/01/21 (!) 336 lb (152.4 kg)   01/29/21 (!) 331 lb (150.1 kg)       Admit Wt: Weight: (!) 315 lb (142.9 kg)   Todays  Wt: Weight: (!) 308 lb 10.3 oz (140 kg)    TELEMETRY personally reviewed:  NSR    Physical Exam:         General Appearance:  Sedated, intubated.    Head:  Normocephalic, without obvious abnormality, atraumatic   Eyes:  PERRL, conjunctiva/corneas clear EOM intact  Ears normal   Throat no lesions       Nose: Nares normal, no drainage or sinus tenderness   Throat: Lips, mucosa, and tongue normal   Neck: Supple, symmetrical, trachea midline, no adenopathy, thyroid: not enlarged, symmetric, no tenderness/mass/nodules, no carotid bruit.        Lungs:   Normal respiratory rate, lungs with ronchi bilaterally.    Chest Wall:  No tenderness or deformity   Heart:  Regular rhythm, rate is controlled, S1, S2 normal, there is no murmur, there is no rub or gallop, cannot assess jvd, 2+ bilateral lower extremity edema   Abdomen:   Soft, non-tender, bowel sounds active all four quadrants,  no masses, no organomegaly       Extremities: Extremities normal, atraumatic, no cyanosis.    Pulses: 2+ and symmetric   Skin: Skin color, texture, turgor normal, no rashes or lesions   Pysch: Normal mood and affect   Neurologic: Normal gross motor and sensory exam.  Cranial nerves intact       Labs:   Recent Labs     02/13/21  0449 02/13/21  0827 02/14/21  0443 02/14/21  0443 02/14/21  1729 02/14/21  1842 02/15/21  0749   NA  --    < > 134*   < > 143 135* 138   K  --    < > 4.4   < > 2.0* 3.5 3.7   BUN  --    < > 14   < > 30* 47* 40*   CREATININE  --    < > 0.7   < > 0.9 1.8* 1.5*   CL  --    < > 93*   < > 116* 94* 95*   CO2  --    < > 33*   < > 17* 26 30   GLUCOSE  --    < > 85   < > 71 120* 105*   CALCIUM  --    < > 8.6   < > 4.1* 8.0* 7.8*   MG 1.70*  --  2.00  --   --   --   --     < > = values in this interval not  displayed.     Recent Labs     02/14/21  0443 02/14/21  0443 02/15/21  0422 02/15/21  0422 02/15/21  1117   WBC 11.5*  --  11.1*  --  12.5*   HGB 12.1  --  13.0  --  13.7   HCT 36.4  --  40.5  --  41.9   PLT 230  --  226  --  220    MCV 89.4   < > 90.3   < > 89.9    < > = values in this interval not displayed.     No results for input(s): CHOLTOT, TRIG, HDL, CHOLHDL, LDL in the last 72 hours.    Invalid input(s): LIPIDCOMM, VLDCHOL  Recent Labs     02/15/21  1117   INR 1.23*     No results for input(s): CKTOTAL, CKMB, CKMBINDEX, TROPONINI in the last 72 hours.  No results for input(s): BNP in the last 72 hours.  No results for input(s): NTPROBNP in the last 72 hours.  No results for input(s): TSH in the last 72 hours.    Imaging:       Assessment & Plan:     1. ??Acute and new HFrEF. ??It is of unknown etiology, could be ischemic (severe CAD) versus nonischemic (PAF RVR, less likely??postchemotherapy, viral). ??Patient is volume overloaded and hypotensive requiring pressor support.   2. ??Non-Hodgkin's lymphoma status post chemotherapy, now in remission  3. ??SLE  4. ??Rheumatoid arthritis  5. ??PAFL RVR. ??Patient appears to be in atrial flutter/coarse atrial fibrillation with RVR. ??It is of unknown duration and she has not been on any anticoagulation. ??She was minimally symptomatic (minimal palpitations). Patient was started on anticoagulation and EP consulted; patient had AFL ablation on 6/24, remained in sinus post procedure.  6.  SVT 6/26, now in sinus after dig bolus on amio drip.   ??  ??  ??  -Continue with Lasix 10 mg/h  - Continue with amiodarone gtt; will not change to p.o. amiodarone until patient able to tolerate low-dose Lopressor and rhythm stable sinus on telemetry x 24 hours.   -BP allows, please resume blood pressure 12.5-25 mg p.o. twice daily  - Strict I's and O's every shift and standing weights if possible, low-salt diet, daily BMP with reflex to Mg (correct lytes for goals K >4.0 and Mg > 2.0) and wean supplemental oxygen to off (or down to baseline supplemental oxygen requirements) for sats greater than 92-94%.  - Will start heparin gtt for PAFL s/p ablation.   - EP following.               Due to the high probability of  clinically significant life threating deterioration of the patient's condition that required my urgent intervention, a total critical care time 40 minutes was used. This time excludes any time that may have been spent performing procedures. This includes but not limited to vital sign monitoring, telemetry monitoring, continuous pulse oximety, IV medication, clinical response to the IV medications, documentation time, consultation time, interpretation of lab data, review of nursing notes and old record review.       I have personally reviewed the reports and images of labs, radiological studies, cardiac studies including ECG's and telemetry, current and old medical records. The note was completed using EMR and Dragon dictation system. Every effort was made to ensure accuracy; however, inadvertent computerized transcription errors may be present.    All questions and concerns were addressed to the  patient/family. Alternatives to my treatment were discussed.     Thank you for allowing to [place] to participate in the care or [facility]. Please call our service with questions.    Laurette Schimke, MD, [facility], FHFSA  [facility]  [address]  [address] [place] [address]  Ph: [phone]  Fax: [phone]

## 2021-02-15 NOTE — Progress Notes (Signed)
Electrophysiology - PROGRESS NOTE    Admit Date: 02/09/2021     Chief Complaint: AF/AFL     Interval History:   Patient seen and examined and notes reviewed. Patient is being followed for AF/AFL.  Patient had presented with progressively worsening shortness of breath ongoing for the past 6 weeks.  She also noted a 40 pound weight gain.  She ended up seeing her PCP who ordered an echo that showed an EF of less than 20%.  She was then referred to the ED for further evaluation due to ongoing symptoms.  She was noted to be in atrial flutter with a poorly controlled ventricular rate.  She was started on aggressive IV diuresis.  She had gone to the EP lab on 02/12/2021 and was noted to have a drop in her saturation.  Anesthesia recommended elective intubation for the procedure.  Post procedure she was unable to be extubated and was transferred to ICU.  She had a chest x-ray that showed increased bronchopulmonary markings prominent upper lobe veins.  She was restarted on her Lasix drip.  She was placed on a milrinone drip however her blood pressure remained low and this was discontinued.  She was placed on pressors for her BP.  She did develop A. fib and was placed on an amiodarone drip.  She currently is in normal sinus rhythm with occasional PVCs and triplets.  She remains intubated and sedated.  Family is at the bedside.  Her heart rate is controlled.  Plans are to extubate her today.    In: 3065 [I.V.:3065]  Out: 4900    Wt Readings from Last 2 Encounters:   02/15/21 (!) 308 lb 10.3 oz (140 kg)   02/01/21 (!) 336 lb (152.4 kg)       Data:   Scheduled Meds:   Scheduled Meds:  ??? potassium chloride  20 mEq IntraVENous Q1H   ??? sodium chloride flush  5-40 mL IntraVENous 2 times per day   ??? [Held by provider] apixaban  5 mg Oral BID   ??? [Held by provider] spironolactone  50 mg Oral Daily   ??? [Held by provider] metoprolol tartrate  25 mg Oral BID   ??? [Held by provider]  DULoxetine  60 mg Oral Daily   ??? [Held by provider] hydroxychloroquine  400 mg Oral Daily     Continuous Infusions:  ??? furosemide (LASIX) 1mg /ml infusion 10 mg/hr (02/15/21 0826)   ??? amiodarone 0.5 mg/min (02/15/21 0405)   ??? sodium chloride     ??? dextrose     ??? dexmedetomidine (PRECEDEX) IV infusion 0.5 mcg/kg/hr (02/15/21 0745)   ??? norepinephrine 2 mcg/min (02/14/21 1630)   ??? propofol 30 mcg/kg/min (02/15/21 02/17/21)   ??? fentaNYL 50 mcg/hr (02/14/21 1253)     PRN Meds:.sodium chloride flush, sodium chloride, acetaminophen, glucose, dextrose bolus **OR** dextrose bolus, glucagon (rDNA), dextrose, hydrOXYzine HCl, albuterol, ondansetron **OR** ondansetron, polyethylene glycol  Continuous Infusions:  ??? furosemide (LASIX) 1mg /ml infusion 10 mg/hr (02/15/21 0826)   ??? amiodarone 0.5 mg/min (02/15/21 0405)   ??? sodium chloride     ??? dextrose     ??? dexmedetomidine (PRECEDEX) IV infusion 0.5 mcg/kg/hr (02/15/21 0745)   ??? norepinephrine 2 mcg/min (02/14/21 1630)   ??? propofol 30 mcg/kg/min (02/15/21 02/16/21)   ??? fentaNYL 50 mcg/hr (02/14/21 1253)       Intake/Output Summary (Last 24 hours) at 02/15/2021 1043  Last data filed at 02/15/2021 0554  Gross per 24 hour   Intake 3065 ml  Output 4200 ml   Net -1135 ml       CBC:   Lab Results   Component Value Date    WBC 11.1 02/15/2021    HGB 13.0 02/15/2021    PLT 226 02/15/2021     BMP:  Lab Results   Component Value Date    NA 138 02/15/2021    K 3.7 02/15/2021    K 3.7 02/09/2021    CL 95 02/15/2021    CO2 30 02/15/2021    BUN 40 02/15/2021    CREATININE 1.5 02/15/2021    GLUCOSE 105 02/15/2021     INR:   Lab Results   Component Value Date    INR 1.27 02/10/2021    INR 1.04 10/25/2016    INR 1.07 09/20/2016        CARDIAC LABS  ENZYMES:No results for input(s): CKMB, CKMBINDEX, TROPONINI in the last 72 hours.    Invalid input(s): CKTOTAL;3  FASTING LIPID PANEL:  Lab Results   Component Value Date    HDL 33 02/10/2021    LDLCALC 72 02/10/2021    TRIG 82 02/10/2021     LIVER PROFILE:  Lab  Results   Component Value Date    AST 86 02/12/2021    AST 61 01/29/2021    ALT 79 02/12/2021    ALT 78 01/29/2021       -----------------------------------------------------------------  Telemetry: Personally reviewed  NSR, occ PVC, PAC, HR ~100    Objective:   Vitals: BP 98/67    Pulse 94    Temp 97.9 ??F (36.6 ??C) (Temporal)    Resp 21    Ht 5\' 11"  (1.803 m)    Wt (!) 308 lb 10.3 oz (140 kg)    SpO2 95%    BMI 43.05 kg/m??   General appearance: intubated and sedated   Eyes: Conjunctiva and pupils normal and reactive  Skin: Skin color, texture, turgor normal. No rashes or ecchymosis.  Neck: no JVD, supple, symmetrical, trachea midline   Lungs: , no accessory muscle use, no respiratory distress  Heart: RRR  Abdomen: soft, non-tender; bowel sounds normal  Extremities: No edema, DP +  Psychiatric: normal insight and affect    Patient Active Problem List:     History of non-Hodgkin's lymphoma     Impaired glucose tolerance     HLD (hyperlipidemia)     Vitamin D deficiency     Morbid obesity with BMI of 40.0-44.9, adult (HCC)     Systemic lupus erythematosus (HCC)     High risk medication use     Status post total left knee replacement     Seasonal allergies     Acute bacterial sinusitis     Shortness of breath     Acute on chronic congestive heart failure (HCC)     CHF (congestive heart failure), NYHA class I, acute on chronic, combined (HCC)     Atrial flutter with rapid ventricular response (HCC)     Dilated cardiomyopathy (HCC)     Acute respiratory failure with hypoxia (HCC)     Lactic acidosis        Assessment & Plan:      1. AF/AFL  2. On amio gtt  3. CMP - EF <20%  4. HFrEF  5. Hypoxic resp failure    60 y/o woman with a h/o morbid obesity, NHL of the L groin (2006), s/p chemo (2006-2007), SLE, RA, who p/w ongoing SOB, cough, BLE edema for approx prior 30 days,  was hypotensive, BNP 4700, found to have an EF<20% per echo, grade III DD, found to be in AFL with a HR of 127 bpm, CT neg for PE, poss elevated R sided  pressures, developed hypoxia on 02/10/2021 that resolved with NRB, s/p RFA of AFL (02/12/2021), became hypoxic post procedure and was unable to be intubated, developed AF, placed on an amio gtt, currenly remains intubated, on precedex, lasix gtt and levophed    CHA2DS2-VASc 2. TSH 1.6 (02/10/2021).     AF  - In NSR  - S/p RFA of AFL w/ pAF noted  - On amio gtt  - Received dig x 1 02/14/2021  - Keep K+ > 4.0 and Mg > 2.0  - Reviewed recent labs  - Placed on heparin gtt - reviewed with Dr. Harrison Mons  - Echo - EF < 20%, sev LV dilatation, grade III DD, RV systolic function sev reduced    CMP  - EF <20%  - NYHA class II/III  - QRS 104  - Will add BB when BP improves post extubation  - Cr improving      Ferdinand Lango CNP  [facility]    I  have spent 40 minutes in care of the patient including direct face to face time, chart preparation, reviewing diagnostic testing, other provider notes and coordinating patient care.

## 2021-02-15 NOTE — Progress Notes (Signed)
Pt was found to have self extubated by RN Amy. Pt had been seen by this RT within the last 10 minutes. Pt also has multiple family members at bedside. Pt in no distress at this time and was placed on 4 lpm.

## 2021-02-15 NOTE — Progress Notes (Signed)
ICU Progress Note    Admit Date: 02/09/2021    CC Vent support     Interval history:  -NAEO  -Down titrating Levo, @2 .   -HR WNL s/p Dig  -Remains Afebrile   -FiO2 40 & PEEP 5 - ABG yesterday WNL   -BMP pending this AM     Medications:     Scheduled Meds:  ??? sodium chloride flush  5-40 mL IntraVENous 2 times per day   ??? [Held by provider] apixaban  5 mg Oral BID   ??? [Held by provider] spironolactone  50 mg Oral Daily   ??? [Held by provider] metoprolol tartrate  25 mg Oral BID   ??? [Held by provider] DULoxetine  60 mg Oral Daily   ??? [Held by provider] hydroxychloroquine  400 mg Oral Daily     Continuous Infusions:  ??? furosemide (LASIX) 1mg /ml infusion 10 mg/hr (02/14/21 2254)   ??? amiodarone 0.5 mg/min (02/15/21 0405)   ??? sodium chloride     ??? dextrose     ??? dexmedetomidine (PRECEDEX) IV infusion 0.5 mcg/kg/hr (02/15/21 0745)   ??? norepinephrine 2 mcg/min (02/14/21 1630)   ??? propofol 30 mcg/kg/min (02/15/21 02/16/21)   ??? fentaNYL 50 mcg/hr (02/14/21 1253)     PRN Meds:sodium chloride flush, sodium chloride, acetaminophen, glucose, dextrose bolus **OR** dextrose bolus, glucagon (rDNA), dextrose, hydrOXYzine HCl, albuterol, ondansetron **OR** ondansetron, polyethylene glycol    Objective:   Vitals:   T-max:  Patient Vitals for the past 8 hrs:   BP Temp Temp src Pulse Resp SpO2 Weight   02/15/21 0554 -- -- -- -- -- -- (!) 308 lb 10.3 oz (140 kg)   02/15/21 0545 -- -- -- 77 23 -- --   02/15/21 0530 -- -- -- 78 16 -- --   02/15/21 0515 -- -- -- 76 17 -- --   02/15/21 0500 -- -- -- 77 14 -- --   02/15/21 0445 -- -- -- 79 13 -- --   02/15/21 0430 -- -- -- 78 15 -- --   02/15/21 0416 -- -- -- 80 12 97 % --   02/15/21 0415 -- -- -- 78 15 -- --   02/15/21 0406 -- 97.9 ??F (36.6 ??C) Temporal -- -- -- --   02/15/21 0400 98/67 -- -- 77 14 -- --   02/15/21 0345 -- -- -- 85 16 -- --   02/15/21 0330 -- -- -- 77 13 -- --   02/15/21 0315 -- -- -- 77 15 -- --   02/15/21 0300 -- -- -- 77 14 -- --   02/15/21 0245 -- -- -- 78 13 -- --   02/15/21  0230 -- -- -- 76 13 -- --   02/15/21 0215 -- -- -- 77 14 -- --   02/15/21 0200 -- -- -- 77 12 -- --   02/15/21 0145 -- -- -- 86 15 -- --   02/15/21 0130 -- -- -- 76 13 -- --   02/15/21 0115 -- -- -- 78 14 -- --   02/15/21 0100 -- -- -- 77 15 -- --   02/15/21 0057 -- -- -- 78 17 97 % --   02/15/21 0045 -- -- -- 78 23 -- --   02/15/21 0030 -- -- -- 75 11 -- --   02/15/21 0015 -- -- -- 76 11 -- --   02/15/21 0001 -- 97.6 ??F (36.4 ??C) Temporal -- -- -- --   02/15/21 0000 85/71 -- -- 76 14 -- --  Intake/Output Summary (Last 24 hours) at 02/15/2021 0751  Last data filed at 02/15/2021 0554  Gross per 24 hour   Intake 3065 ml   Output 4900 ml   Net -1835 ml       Physical Exam  Constitutional:       General: She is not in acute distress.     Comments: Intubated and sedated   HENT:      Head: Normocephalic and atraumatic.      Mouth/Throat:      Mouth: Mucous membranes are moist.      Pharynx: Oropharynx is clear.   Eyes:      Conjunctiva/sclera: Conjunctivae normal.      Pupils: Pupils are equal, round, and reactive to light.   Cardiovascular:      Rate and Rhythm: Normal rate and regular rhythm.      Pulses: Normal pulses.      Heart sounds: Normal heart sounds. No murmur heard.  No friction rub. No gallop.    Pulmonary:      Effort: Pulmonary effort is normal.      Breath sounds: Normal breath sounds. No wheezing, rhonchi or rales.   Abdominal:      General: Bowel sounds are normal.      Palpations: Abdomen is soft.      Tenderness: There is no abdominal tenderness.   Musculoskeletal:         General: Normal range of motion.      Cervical back: Normal range of motion and neck supple.      Right lower leg: No edema.      Left lower leg: No edema.   Skin:     General: Skin is warm and dry.      Capillary Refill: Capillary refill takes less than 2 seconds.   Neurological:      Comments: Intubated and sedated   Psychiatric:      Comments: Intubated and sedated           LABS:    CBC:   Recent Labs     02/13/21  0449  02/14/21  0443 02/15/21  0422   WBC 15.5* 11.5* 11.1*   HGB 12.0 12.1 13.0   HCT 36.6 36.4 40.5   PLT 249 230 226   MCV 89.8 89.4 90.3     Renal:    Recent Labs     02/13/21  0449 02/13/21  0827 02/14/21  0443 02/14/21  1729 02/14/21  1842   NA  --    < > 134* 143 135*   K  --    < > 4.4 2.0* 3.5   CL  --    < > 93* 116* 94*   CO2  --    < > 33* 17* 26   BUN  --    < > 14 30* 47*   CREATININE  --    < > 0.7 0.9 1.8*   GLUCOSE  --    < > 85 71 120*   CALCIUM  --    < > 8.6 4.1* 8.0*   MG 1.70*  --  2.00  --   --    PHOS  --   --   --  2.4* 4.4   ANIONGAP  --    < > 8 10 15     < > = values in this interval not displayed.     Hepatic:   Recent Labs     02/12/21  1523 02/14/21  1729 02/14/21  1842   AST 86*  --   --    ALT 79*  --   --    BILITOT 1.9*  --   --    PROT 6.6  --   --    LABALBU 4.7 1.7* 3.5   ALKPHOS 138*  --   --      Troponin:   No results for input(s): TROPONINI in the last 72 hours.  BNP: No results for input(s): BNP in the last 72 hours.  Lipids: No results for input(s): CHOL, HDL in the last 72 hours.    Invalid input(s): LDLCALCU, TRIGLYCERIDE  ABGs:    Recent Labs     02/12/21  1524 02/13/21  0800 02/14/21  1129   PHART 7.312* 7.481* 7.441   PCO2ART 39.4 34.4* 41.7   PO2ART 551.4* 113.0* 102.0   HCO3ART 19.9* 26 28   BEART -6* 2.4 3.8*   O2SATART 100 99 99   TCO2ART 21 27 30        INR:   No results for input(s): INR in the last 72 hours.  Lactate:   Recent Labs     02/12/21  1446 02/12/21  1524   LACTATE 7.22* 6.85*     Cultures:  -----------------------------------------------------------------  RAD:   XR CHEST PORTABLE   Final Result      1. Endotracheal tube in stable position. Right IJ central venous catheter placed with the tip in the distal SVC in satisfactory position   2. Stable cardiac enlargement with no acute segmental consolidation               XR CHEST PORTABLE   Final Result   Impression:       Tip of endotracheal tube is 5 cm the carina.      Lungs are clear. No pneumothorax.       CT CHEST PULMONARY EMBOLISM W CONTRAST   Final Result      1. No evidence of any pulmonary embolism.   2. Four-chamber cardiac enlargement with reflux of contrast into the hepatic veins. This can be seen with elevated right heart pressures.   3. No acute pulmonary process.      XR CHEST PORTABLE   Final Result      Stable cardiac enlargement      No acute consolidation                  Assessment/Plan:   60 yo female with a PMH of NHL, SLE who presented to the ED with chief complaints of cough and shortness of breath for the past 6 weeks that got worse recently in the past few days  ??  Acute Exacerbation of New-Onset HFrEF   Cardiogenic shock  AKI  Recent ECHO on 6/17 with severe dilation of LV, EF of <20%, severe diffuse hypokinesis and grade 3 diastolic dysfunction.  -Lasix gtt @ 10  -Holding Toprol XL ER 25 mg PO daily  -Holding Aldactone 50 mg daily, gtt equivalent   -requires ischemic w/u, consider when euvolemic??  -Cards c/s - appreciate recs  -s/p AFL ablation  -On levo  -sedated and intubated on 3 sedatives [Fentanyl, propofol, Precedex]  -UOP 4.9L, Cr pending     Acute Respiratory Failure 2/2 acute HFrEF s/p a flutter ablation   -mechanical ventilation and sedation  -FiO2 of 40% & PEEP 5  -Will attempt SBT today     New-Onset Atrial Flutter s/p ablation   Patient endorses SOB however no  chest pain or palpitations. She states she feels anxious. Tachycardic on exam.   Arrived to ED in Atrial Flutter based on EKG obtained at admission. Given Metoprolol 2.5 mg once on admission. ??  -ablation w/ EP 6/24  -Cards??and EP??c/s - reccs appreciated  ??  Anxiety  Patient denies any history of. Appears very anxious this admission. Consider related to Atrial flutter and its ensuing symptoms.  -currently sedated   ??  Chronic Medical Conditions:  SLE - holding Plaquenil 400 mg PO daily, Cymbalta 60 mg PO daily as NPO  OA - holding Relafen    Code Status: FULL  FEN: NPO  PPX: Eliquis (Held)  DISPO: ICU    This patient has  been staffed and discussed with Dr. Jonah Blue, MD, PGY-1  02/15/21  7:51 AM   Patient examined, findings as discussed with Dr. Dimas Aguas.  Agree with assessment and plan.  Management of critical illness required for decompensated heart failure and associated hypoxic respiratory failure.  She has had significant diuresis.  On spontaneous breathing trial today, she demonstrated adequate ventilatory effort and maintained gas exchange.  She  successfully extubated.  On post extubation examination, she has no respiratory insufficiency, oxygenating well with low flow O2, exhibits good cough.  Continuing treatment of heart failure  Family updated regarding progress and plan.  Time spent in critical care 45 minutes

## 2021-02-15 NOTE — Progress Notes (Signed)
Pt family came into hallway saying patient was trying to pull out ET tube. Upon entering room ET tube had been fully removed and husband was standing at bedside. Oral care provided and nasal cannula placed. Patient has strong cough and is sating 95%. RT called. Pt currently is not responding verbally except saying "help me", she is not making direct eye contact but is looking towards voice. ICU resident to bedside to evaluate.

## 2021-02-15 NOTE — Progress Notes (Signed)
At 2000 pt complains of SOB. Afib with rate 100-110 on monitor. BP 124/72 (89).  Satting 98% on 2LNC. Chest xray obtained. 5 mg metoprolol given. Pt now normal sinus to sinus tach on monitor. Pt receiving mag and potassium replacements. See MAR. Frequent productive cough with green sputum. ICU residents aware.     Pt now very restless. Pulling nasal cannula off and reaching for central line. Frequent education and reorientation provided. Bilateral soft wrist restraints applied for patient safety. 5mg  Zyprexa given. Will cont close monitoring.

## 2021-02-15 NOTE — Other (Signed)
RT Nebulizer Bronchodilator Protocol Note    There is a bronchodilator order in the chart from a provider indicating to follow the RT Bronchodilator Protocol and there is an ???Initiate RT Bronchodilator Protocol??? order as well (see protocol at bottom of note).    CXR Findings:  XR CHEST PORTABLE    Result Date: 02/15/2021  Mild left basilar airspace disease.      The findings from the last RT Protocol Assessment were as follows:  Smoking: None or smoker <15 pack years  Respiratory Pattern: Regular pattern and RR 12-20 bpm  Breath Sounds: Slightly diminished and/or crackles  Cough: Strong, spontaneous, non-productive  Indication for Bronchodilator Therapy: None  Bronchodilator Assessment Score: 2  Patient ordered on 3% sodium chloride and physician asked to give with Duoneb. Continue with Q4HWA  Aerosolized bronchodilator medication orders have been revised according to the RT Nebulizer Bronchodilator Protocol below.    Respiratory Therapist to perform RT Therapy Protocol Assessment initially then follow the protocol.  Repeat RT Therapy Protocol Assessment PRN for score 0-3 or on second treatment, BID, and PRN for scores above 3.    No Indications - adjust the frequency to every 6 hours PRN wheezing or bronchospasm, if no treatments needed after 48 hours then discontinue using Per Protocol order mode.     If indication present, adjust the RT bronchodilator orders based on the Bronchodilator Assessment Score as indicated below.  If a patient is on this medication at home then do not decrease Frequency below that used at home.    0-3 - enter or revise RT bronchodilator order(s) to equivalent RT Bronchodilator order with Frequency of every 4 hours PRN for wheezing or increased work of breathing using Per Protocol order mode.       4-6 - enter or revise RT Bronchodilator order(s) to two equivalent RT bronchodilator orders with one order with BID Frequency and one order with Frequency of every 4 hours PRN wheezing or  increased work of breathing using Per Protocol order mode.         7-10 - enter or revise RT Bronchodilator order(s) to two equivalent RT bronchodilator orders with one order with TID Frequency and one order with Frequency of every 4 hours PRN wheezing or increased work of breathing using Per Protocol order mode.       11-13 - enter or revise RT Bronchodilator order(s) to one equivalent RT bronchodilator order with QID Frequency and an Albuterol order with Frequency of every 4 hours PRN wheezing or increased work of breathing using Per Protocol order mode.      Greater than 13 - enter or revise RT Bronchodilator order(s) to one equivalent RT bronchodilator order with every 4 hours Frequency and an Albuterol order with Frequency of every 2 hours PRN wheezing or increased work of breathing using Per Protocol order mode.     RT to enter RT Home Evaluation for COPD & MDI Assessment order using Per Protocol order mode.    Electronically signed by Delphia Grates, RCP on 02/15/2021 at 9:29 PM

## 2021-02-16 ENCOUNTER — Inpatient Hospital Stay: Admit: 2021-02-16 | Payer: MEDICAID | Primary: Gerontology

## 2021-02-16 ENCOUNTER — Encounter: Attending: Cardiovascular Disease | Primary: Gerontology

## 2021-02-16 LAB — BASIC METABOLIC PANEL
Anion Gap: 15 (ref 3–16)
Anion Gap: 15 (ref 3–16)
Anion Gap: 15 (ref 3–16)
BUN: 26 mg/dL — ABNORMAL HIGH (ref 7–20)
BUN: 31 mg/dL — ABNORMAL HIGH (ref 7–20)
BUN: 35 mg/dL — ABNORMAL HIGH (ref 7–20)
CO2: 29 mmol/L (ref 21–32)
CO2: 29 mmol/L (ref 21–32)
CO2: 32 mmol/L (ref 21–32)
Calcium: 8.2 mg/dL — ABNORMAL LOW (ref 8.3–10.6)
Calcium: 8.5 mg/dL (ref 8.3–10.6)
Calcium: 9.2 mg/dL (ref 8.3–10.6)
Chloride: 90 mmol/L — ABNORMAL LOW (ref 99–110)
Chloride: 90 mmol/L — ABNORMAL LOW (ref 99–110)
Chloride: 91 mmol/L — ABNORMAL LOW (ref 99–110)
Creatinine: 1.2 mg/dL — ABNORMAL HIGH (ref 0.6–1.1)
Creatinine: 1.3 mg/dL — ABNORMAL HIGH (ref 0.6–1.1)
Creatinine: 1.5 mg/dL — ABNORMAL HIGH (ref 0.6–1.1)
GFR African American: 43 — AB (ref >60)
GFR African American: 51 — AB (ref >60)
GFR African American: 56 — AB (ref >60)
GFR Non-African American: 36 — AB (ref >60)
GFR Non-African American: 42 — AB (ref >60)
GFR Non-African American: 46 — AB (ref >60)
Glucose: 115 mg/dL — ABNORMAL HIGH (ref 70–99)
Glucose: 126 mg/dL — ABNORMAL HIGH (ref 70–99)
Glucose: 130 mg/dL — ABNORMAL HIGH (ref 70–99)
Potassium: 3.4 mmol/L — ABNORMAL LOW (ref 3.5–5.1)
Potassium: 3.7 mmol/L (ref 3.5–5.1)
Potassium: 4.4 mmol/L (ref 3.5–5.1)
Sodium: 134 mmol/L — ABNORMAL LOW (ref 136–145)
Sodium: 135 mmol/L — ABNORMAL LOW (ref 136–145)
Sodium: 137 mmol/L (ref 136–145)

## 2021-02-16 LAB — CBC WITH AUTO DIFFERENTIAL
Basophils %: 0.8 %
Basophils Absolute: 0.1 10*3/uL (ref 0.0–0.2)
Eosinophils %: 0.3 %
Eosinophils Absolute: 0 10*3/uL (ref 0.0–0.6)
Hematocrit: 43.4 % (ref 36.0–48.0)
Hemoglobin: 14.2 g/dL (ref 12.0–16.0)
Lymphocytes %: 12.9 %
Lymphocytes Absolute: 1.7 10*3/uL (ref 1.0–5.1)
MCH: 29.5 pg (ref 26.0–34.0)
MCHC: 32.8 g/dL (ref 31.0–36.0)
MCV: 90.1 fL (ref 80.0–100.0)
MPV: 8.2 fL (ref 5.0–10.5)
Monocytes %: 18.9 %
Monocytes Absolute: 2.5 10*3/uL — ABNORMAL HIGH (ref 0.0–1.3)
Neutrophils %: 67.1 %
Neutrophils Absolute: 8.7 10*3/uL — ABNORMAL HIGH (ref 1.7–7.7)
Platelets: 210 10*3/uL (ref 135–450)
RBC: 4.82 M/uL (ref 4.00–5.20)
RDW: 15.4 % (ref 12.4–15.4)
WBC: 13 10*3/uL — ABNORMAL HIGH (ref 4.0–11.0)

## 2021-02-16 LAB — ANTI-XA, UNFRACTIONATED HEPARIN
Anti-XA Unfrac Heparin: 0.6 IU/mL (ref 0.30–0.70)
Anti-XA Unfrac Heparin: 0.78 IU/mL — ABNORMAL HIGH (ref 0.30–0.70)
Anti-XA Unfrac Heparin: 1.04 IU/mL (ref 0.30–0.70)

## 2021-02-16 LAB — MAGNESIUM
Magnesium: 1.6 mg/dL — ABNORMAL LOW (ref 1.80–2.40)
Magnesium: 2.5 mg/dL — ABNORMAL HIGH (ref 1.80–2.40)

## 2021-02-16 MED ORDER — IPRATROPIUM-ALBUTEROL 0.5-2.5 (3) MG/3ML IN SOLN
RESPIRATORY_TRACT | Status: DC
Start: 2021-02-16 — End: 2021-02-16
  Administered 2021-02-16: 13:00:00 1 via RESPIRATORY_TRACT

## 2021-02-16 MED ORDER — IPRATROPIUM-ALBUTEROL 0.5-2.5 (3) MG/3ML IN SOLN
RESPIRATORY_TRACT | Status: DC | PRN
Start: 2021-02-16 — End: 2021-02-24

## 2021-02-16 MED ORDER — POTASSIUM CHLORIDE 20 MEQ/50ML IV SOLN
2050 MEQ/50ML | INTRAVENOUS | Status: DC
Start: 2021-02-16 — End: 2021-02-15

## 2021-02-16 MED ORDER — MELATONIN 5 MG PO TBDP
5 MG | Freq: Every evening | ORAL | Status: DC
Start: 2021-02-16 — End: 2021-02-24
  Administered 2021-02-16 – 2021-02-24 (×8): 5 mg via ORAL

## 2021-02-16 MED ORDER — MAGNESIUM SULFATE 2000 MG/50 ML IVPB PREMIX
2 GM/50ML | Freq: Once | INTRAVENOUS | Status: AC
Start: 2021-02-16 — End: 2021-02-15
  Administered 2021-02-16: 02:00:00 2000 mg via INTRAVENOUS

## 2021-02-16 MED ORDER — METOPROLOL TARTRATE 5 MG/5ML IV SOLN
55 MG/ML | INTRAVENOUS | Status: AC
Start: 2021-02-16 — End: 2021-02-15
  Administered 2021-02-16: 5 via INTRAVENOUS

## 2021-02-16 MED ORDER — DIGOXIN 0.25 MG/ML IJ SOLN
0.25 MG/ML | Freq: Once | INTRAMUSCULAR | Status: AC
Start: 2021-02-16 — End: 2021-02-16
  Administered 2021-02-16: 19:00:00 125 ug via INTRAVENOUS

## 2021-02-16 MED ORDER — POTASSIUM CHLORIDE 20 MEQ/50ML IV SOLN
20 MEQ/50ML | INTRAVENOUS | Status: AC
Start: 2021-02-16 — End: 2021-02-16
  Administered 2021-02-16 (×3): 20 meq via INTRAVENOUS

## 2021-02-16 MED ORDER — SODIUM CHLORIDE 3 % IN NEBU
3 % | RESPIRATORY_TRACT | Status: DC
Start: 2021-02-16 — End: 2021-02-15
  Administered 2021-02-16: 01:00:00 4 mL via RESPIRATORY_TRACT

## 2021-02-16 MED ORDER — POTASSIUM CHLORIDE 20 MEQ/50ML IV SOLN
20 MEQ/50ML | INTRAVENOUS | Status: DC
Start: 2021-02-16 — End: 2021-02-15

## 2021-02-16 MED ORDER — OLANZAPINE 10 MG IM SOLR
10 MG | Freq: Once | INTRAMUSCULAR | Status: AC
Start: 2021-02-16 — End: 2021-02-15
  Administered 2021-02-16: 03:00:00 5 mg via INTRAMUSCULAR

## 2021-02-16 MED ORDER — MAGNESIUM SULFATE 2000 MG/50 ML IVPB PREMIX
2 GM/50ML | Freq: Once | INTRAVENOUS | Status: AC
Start: 2021-02-16 — End: 2021-02-16
  Administered 2021-02-16: 04:00:00 2000 mg via INTRAVENOUS

## 2021-02-16 MED ORDER — SODIUM CHLORIDE 3 % IN NEBU
3 % | RESPIRATORY_TRACT | Status: DC
Start: 2021-02-16 — End: 2021-02-16
  Administered 2021-02-16: 13:00:00 4 mL via RESPIRATORY_TRACT

## 2021-02-16 MED ORDER — METOPROLOL TARTRATE 5 MG/5ML IV SOLN
55 MG/ML | Freq: Once | INTRAVENOUS | Status: DC
Start: 2021-02-16 — End: 2021-02-15

## 2021-02-16 MED ORDER — POTASSIUM CHLORIDE 20 MEQ/50ML IV SOLN
20 MEQ/50ML | INTRAVENOUS | Status: AC
Start: 2021-02-16 — End: 2021-02-15
  Administered 2021-02-16: 02:00:00 20 via INTRAVENOUS

## 2021-02-16 MED ORDER — SODIUM CHLORIDE 3 % IN NEBU
3 % | Freq: Four times a day (QID) | RESPIRATORY_TRACT | Status: DC | PRN
Start: 2021-02-16 — End: 2021-02-24

## 2021-02-16 MED FILL — MAGNESIUM SULFATE 2 GM/50ML IV SOLN: 2 GM/50ML | INTRAVENOUS | Qty: 50

## 2021-02-16 MED FILL — ELIQUIS 5 MG PO TABS: 5 mg | ORAL | Qty: 1

## 2021-02-16 MED FILL — POTASSIUM CHLORIDE 20 MEQ/50ML IV SOLN: 20 MEQ/50ML | INTRAVENOUS | Qty: 50

## 2021-02-16 MED FILL — AMIODARONE HCL 150 MG/3ML IV SOLN: 150 MG/3ML | INTRAVENOUS | Qty: 9

## 2021-02-16 MED FILL — SODIUM CHLORIDE 3 % IN NEBU: 3 % | RESPIRATORY_TRACT | Qty: 15

## 2021-02-16 MED FILL — OLANZAPINE 10 MG IM SOLR: 10 mg | INTRAMUSCULAR | Qty: 10

## 2021-02-16 MED FILL — FUROSEMIDE 10 MG/ML IJ SOLN: 10 mg/mL | INTRAMUSCULAR | Qty: 10

## 2021-02-16 MED FILL — IPRATROPIUM-ALBUTEROL 0.5-2.5 (3) MG/3ML IN SOLN: 0.5-2.5 (3) MG/3ML | RESPIRATORY_TRACT | Qty: 3

## 2021-02-16 MED FILL — HEPARIN SOD (PORCINE) IN D5W 100 UNIT/ML IV SOLN: 100 [IU]/mL | INTRAVENOUS | Qty: 250

## 2021-02-16 MED FILL — DIGOXIN 0.25 MG/ML IJ SOLN: 0.25 mg/mL | INTRAMUSCULAR | Qty: 2

## 2021-02-16 MED FILL — METOPROLOL TARTRATE 25 MG PO TABS: 25 mg | ORAL | Qty: 1

## 2021-02-16 MED FILL — ALBUTEROL SULFATE (2.5 MG/3ML) 0.083% IN NEBU: RESPIRATORY_TRACT | Qty: 3

## 2021-02-16 MED FILL — METOPROLOL TARTRATE 5 MG/5ML IV SOLN: 5 mg/mL | INTRAVENOUS | Qty: 5

## 2021-02-16 MED FILL — MELATONIN 5 MG PO TBDP: 5 mg | ORAL | Qty: 1

## 2021-02-16 NOTE — Progress Notes (Signed)
Speech Language Pathology  Facility/Department: [facility] ICU   CLINICAL BEDSIDE SWALLOW EVALUATION  Treatment     NAME: Molly [name]  DOB: [birth year]  MRN: [id number]    ADMISSION DATE: 02/09/2021  ADMITTING DIAGNOSIS: has History of non-Hodgkin's lymphoma; Impaired glucose tolerance; HLD (hyperlipidemia); Vitamin D deficiency; Morbid obesity with BMI of 40.0-44.9, adult (HCC); Systemic lupus erythematosus (HCC); High risk medication use; Status post total left knee replacement; Seasonal allergies; Acute bacterial sinusitis; Shortness of breath; Acute on chronic congestive heart failure (HCC); CHF (congestive heart failure), NYHA class I, acute on chronic, combined (HCC); Atrial flutter with rapid ventricular response (HCC); Dilated cardiomyopathy (HCC); Acute respiratory failure with hypoxia (HCC); Lactic acidosis; and Paroxysmal atrial fibrillation (HCC) on their problem list.  ONSET DATE: 02/09/21    Recent Chest Xray 02/15/21  Mild left basilar airspace disease.    Date of Eval: 02/16/2021  Evaluating Therapist: Frances Furbish, SLP    Current Diet level:  Current Diet : NPO      Primary Complaint  Patient Complaint: pt is without complaints    Pain:  Denied pain     Reason for Referral  Molly [name] was referred for a bedside swallow evaluation to assess the efficiency of her swallow function, identify signs and symptoms of aspiration and make recommendations regarding safe dietary consistencies, effective compensatory strategies, and safe eating environment.    ??  History of Present Illness   ??  Molly [name] is a 60 y.o. female with a PMH as described below who presents to the ED today with a chief complaint of: Shortness of breath, cough, and lower extremity swelling.  Patient carries a relatively new diagnosis of CHF with an ejection fraction of 20% on echo shown below that was performed earlier this month.  She is on Lasix 40 mg daily.  She does not know what her dry weight is but feels very swollen in her  legs and notes orthopnea, inability to lay flat, shortness of breath with exertion, and cough with occasional mucus-like sputum production.  She called her primary care doctor and was directed to the emergency room today.  She denies any significant acute change over the last few days but states that it has been getting worse for the past 30 days finally to the point where she is unable to tolerate it.  She denies any abdominal pain, nausea, vomiting, diarrhea, fever, chills, or dysuria.  ??  The patientstates that there were no other associated signs and symptoms or modifying factors.    Impression  Pt intubated 6/24 and self extubated 6/27. Pt alert and oriented but with weak, mostly aphonic voice. pt was assessed with trials with ice chips, water by cup and straw and applesauce. ROM of oral structures was [facility].  Pt had no difficulty closing lips around spoon or drawing liquid up a straw.  Pt had no difficulty with mastication with ice chips.  There was no anterior spillage or oral residue noted with any consistency. Pt had difficulty with initiating a dry swallow but had no difficulty with PO trials. Laryngeal movement noted with all trials. Pt noted to produce a strong reactive cough following first trial of water when consumed successive swallows of water by cup.  Provided pt with additional trials of water by cup and straw through out the session, but no other incidences of coughing or throat clearing noted, even when consumed 3 oz of water continuously. No s/s aspiration with 3 trials of applesauce. Given impaired vocal  quality and recent extubation, recommend instrumental assessment prior to PO diet.     Dysphagia Diagnosis: Suspected needs further assessment  Dysphagia Outcome Severity Scale: Level 5: Mild dysphagia- Distant supervision. May need one diet consistency restricted     Treatment Plan  Requires SLP Intervention: Yes     D/C Recommendations: To be determined       Recommended Diet and  Intervention   diet recommendation-  TBD after MBS  MBS scheduled for this date      Recommended Form of Meds:  (TBD following MBS)  Recommendations: Modified barium swallow study  Therapeutic Interventions:  (TBD following MBS)    Compensatory Swallowing Strategies  Compensatory Swallowing Strategies :  (TBD following MBS)    Treatment/Goals  1-The patient will tolerate instrumental swallowing procedure    2- The pt/caregiver will demonstrate understanding of swallowing recommendations and concerns.  6/28-   The pt and family member were educated to purpose of the visit, anatomy and physiology of the swallow, impact intubation can have on swallowing function, role vocal cords play in airway protectionconcerns for aspiration, recommendation for modified barium swallow and a description of the procedure. Both stated comprehension and pt was in agreement with MBS. con't goal      General  Chart Reviewed: Yes  Behavior/Cognition: Alert;Cooperative;Pleasant mood  Respiratory Status: O2 via nasual cannula  Communication Observation: Functional  Follows Directions: Simple  Dentition: Adequate  Patient Positioning: Upright in bed  Baseline Vocal Quality: Dysphonic  Volitional Cough: Weak  Prior Dysphagia History: no history of dysphagia           Vision/Hearing  Vision  Vision: Within Functional Limits  Hearing  Hearing: Within functional limits      Prognosis  Individuals consulted  Consulted and agree with results and recommendations: Patient;RN;Family member  RN Name: Morrie Sheldon    Education  Patient Education: Role of SLP  Patient Education Response: Primary school teacher Devices in place: Yes  Type of devices: Call light within reach       Therapy Time  SLP Individual Minutes  Time In: 0932  Time Out: 0958  Minutes: 26              Pt's goal: to eat       Plan:  Continue goals per POC  Recommended diet: TBD after MBS  MBS scheduled for today   Total treatment time:26  Pt's discharge plan:to home   Discharge  Plan: To be determined closer to discharge  Discussed with RN, Morrie Sheldon   Needs within reach.       Frances Furbish, MA, [facility]- SLP  [phone]  Pg # [phone]  This document will serve as a discharge summary if pt discharge before next treatment   session

## 2021-02-16 NOTE — Progress Notes (Signed)
Electrophysiology - PROGRESS NOTE    Admit Date: 02/09/2021     Chief Complaint: AF/AFL     Interval History:   Patient seen and examined and notes reviewed. Patient is being followed for AF/AFL.  Patient had presented with progressively worsening shortness of breath ongoing for the past 6 weeks.  She also noted a 40 pound weight gain.  She ended up seeing her PCP who ordered an echo that showed an EF of less than 20%.  She was then referred to the ED for further evaluation due to ongoing symptoms.  She was noted to be in atrial flutter with a poorly controlled ventricular rate.  She was started on aggressive IV diuresis.  She had gone to the EP lab on 02/12/2021 and was noted to have a drop in her saturation.  Anesthesia recommended elective intubation for the procedure.  Post procedure she was unable to be extubated and was transferred to ICU.  CXR showed increased bronchopulmonary markings prominent upper lobe veins.  She was restarted on her Lasix drip.  She was placed on a milrinone drip however her blood pressure remained low and this was discontinued over the weekend.  She was placed on pressors for her BP, now off.  She did develop A. fib and was placed on an amiodarone drip. Extubated yesterday. Awake and alert. In AF with HR in the low 100's. Remains on amio. IV BB x1 overnight.     In: 1700 [I.V.:1700]  Out: 7550    Wt Readings from Last 2 Encounters:   02/16/21 287 lb 11.2 oz (130.5 kg)   02/01/21 (!) 336 lb (152.4 kg)       Data:   Scheduled Meds:   Scheduled Meds:  ??? ipratropium-albuterol  1 ampule Inhalation Q4H WA   ??? sodium chloride (Inhalant)  4 mL Nebulization Q4H WA   ??? sodium chloride flush  5-40 mL IntraVENous 2 times per day   ??? [Held by provider] apixaban  5 mg Oral BID   ??? [Held by provider] spironolactone  50 mg Oral Daily   ??? [Held by provider] metoprolol tartrate  25 mg Oral BID   ??? [Held by provider] DULoxetine  60 mg Oral Daily   ???  [Held by provider] hydroxychloroquine  400 mg Oral Daily     Continuous Infusions:  ??? heparin (PORCINE) Infusion 12 Units/kg/hr (02/16/21 0241)   ??? furosemide (LASIX) 1mg /ml infusion 10 mg/hr (02/16/21 0233)   ??? amiodarone 0.5 mg/min (02/15/21 2000)   ??? sodium chloride     ??? dextrose     ??? norepinephrine Stopped (02/15/21 0945)     PRN Meds:.heparin (porcine), heparin (porcine), phenol, sodium chloride flush, sodium chloride, acetaminophen, glucose, dextrose bolus **OR** dextrose bolus, glucagon (rDNA), dextrose, hydrOXYzine HCl, albuterol, ondansetron **OR** ondansetron, polyethylene glycol  Continuous Infusions:  ??? heparin (PORCINE) Infusion 12 Units/kg/hr (02/16/21 0241)   ??? furosemide (LASIX) 1mg /ml infusion 10 mg/hr (02/16/21 0233)   ??? amiodarone 0.5 mg/min (02/15/21 2000)   ??? sodium chloride     ??? dextrose     ??? norepinephrine Stopped (02/15/21 0945)       Intake/Output Summary (Last 24 hours) at 02/16/2021 0829  Last data filed at 02/16/2021 0600  Gross per 24 hour   Intake 1700 ml   Output 7550 ml   Net -5850 ml       CBC:   Lab Results   Component Value Date    WBC 13.0 02/16/2021    HGB 14.2 02/16/2021  PLT 210 02/16/2021     BMP:  Lab Results   Component Value Date    NA 135 02/16/2021    K 4.4 02/16/2021    K 3.7 02/09/2021    CL 91 02/16/2021    CO2 29 02/16/2021    BUN 31 02/16/2021    CREATININE 1.3 02/16/2021    GLUCOSE 115 02/16/2021     INR:   Lab Results   Component Value Date    INR 1.23 02/15/2021    INR 1.27 02/10/2021    INR 1.04 10/25/2016        CARDIAC LABS  ENZYMES:No results for input(s): CKMB, CKMBINDEX, TROPONINI in the last 72 hours.    Invalid input(s): CKTOTAL;3  FASTING LIPID PANEL:  Lab Results   Component Value Date    HDL 33 02/10/2021    LDLCALC 72 02/10/2021    TRIG 82 02/10/2021     LIVER PROFILE:  Lab Results   Component Value Date    AST 86 02/12/2021    AST 61 01/29/2021    ALT 79 02/12/2021    ALT 78 01/29/2021        -----------------------------------------------------------------  Telemetry: Personally reviewed  AF/RVR - HR in the low 100's    Objective:   Vitals: BP 114/77    Pulse (!) 113    Temp 98.4 ??F (36.9 ??C) (Temporal)    Resp (!) 9    Ht 5\' 11"  (1.803 m)    Wt 287 lb 11.2 oz (130.5 kg)    SpO2 96%    BMI 40.13 kg/m??   General appearance: intubated and sedated   Eyes: Conjunctiva and pupils normal and reactive  Skin: Skin color, texture, turgor normal. No rashes or ecchymosis.  Neck: no JVD, supple, symmetrical, trachea midline   Lungs: , no accessory muscle use, no respiratory distress  Heart: RRR  Abdomen: soft, non-tender; bowel sounds normal  Extremities: No edema, DP +  Psychiatric: normal insight and affect    Patient Active Problem List:     History of non-Hodgkin's lymphoma     Impaired glucose tolerance     HLD (hyperlipidemia)     Vitamin D deficiency     Morbid obesity with BMI of 40.0-44.9, adult (HCC)     Systemic lupus erythematosus (HCC)     High risk medication use     Status post total left knee replacement     Seasonal allergies     Acute bacterial sinusitis     Shortness of breath     Acute on chronic congestive heart failure (HCC)     CHF (congestive heart failure), NYHA class I, acute on chronic, combined (HCC)     Atrial flutter with rapid ventricular response (HCC)     Dilated cardiomyopathy (HCC)     Acute respiratory failure with hypoxia (HCC)     Lactic acidosis        Assessment & Plan:      1. AF/AFL  2. On amio gtt  3. CMP - EF <20%  4. HFrEF  5. Hypoxic resp failure    60 y/o woman with a h/o morbid obesity, NHL of the L groin (2006), s/p chemo (2006-2007), SLE, RA, who p/w ongoing SOB, cough, BLE edema for approx prior 30 days, was hypotensive, BNP 4700, found to have an EF<20% per echo, grade III DD, found to be in AFL with a HR of 127 bpm, CT neg for PE, poss elevated R sided pressures, developed hypoxia on  02/10/2021 that resolved with NRB, s/p RFA of AFL (02/12/2021), became  hypoxic post procedure and was unable to be intubated, developed AF, placed on an amio gtt, currenly remains intubated, on precedex, lasix gtt and levophed, now off pressors.  CHA2DS2-VASc 2. TSH 1.6 (02/10/2021).     AF  - In AF with HR not well controlled  - S/p RFA of AFL w/ pAF noted  - On amio gtt  - Received dig x 1 (02/14/2021)  - Was given metoprolol 5 mg IV x 1 overnight  - Keep K+ > 4.0 and Mg > 2.0  - Reviewed recent labs  - On heparin gtt   - OAC on hold  - Echo - EF < 20%, sev LV dilatation, grade III DD, RV systolic function sev reduced  - Will discuss plan of care with Dr. Larwance Sachs    CMP  - EF <20%  - NYHA class II/III  - QRS 104  - BB on hold - will restart when BP improves post extubation  - Cr improving - now 1.3  - Ischemic w/u when   - Dr. Harrison Mons following    Ferdinand Lango CNP  [facility]    I  have spent 40 minutes in care of the patient including direct face to face time, chart preparation, reviewing diagnostic testing, other provider notes and coordinating patient care.

## 2021-02-16 NOTE — Progress Notes (Signed)
Hospitalist Progress Note      PCP: Lindell Spar, APRN - CNP    Date of Admission: 02/09/2021    Chief Complaint: Shortness of breath     Hospital Course: see H&P     Subjective: seen and examined at bedside.  Patient was extubated yesterday.  On nasal cannula.  Off levo.  On amnio/Lasix and heparin drip.  Was agitated overnight requiring Zyprexa.  Patient aspirated with thin liquids and speech evaluation.  Dysphagia diet with thickened liquids recommended per speech.    Patient feeling tired.  Denies any other complaints.  Niece at bedside.     Medications:  Reviewed    Infusion Medications   ??? heparin (PORCINE) Infusion 11 Units/kg/hr (02/16/21 1041)   ??? furosemide (LASIX) 1mg /ml infusion 10 mg/hr (02/16/21 1249)   ??? amiodarone 0.5 mg/min (02/16/21 1247)   ??? sodium chloride     ??? dextrose     ??? norepinephrine Stopped (02/15/21 0945)     Scheduled Medications   ??? melatonin  5 mg Oral Nightly   ??? sodium chloride flush  5-40 mL IntraVENous 2 times per day   ??? [Held by provider] apixaban  5 mg Oral BID   ??? [Held by provider] spironolactone  50 mg Oral Daily   ??? [Held by provider] metoprolol tartrate  25 mg Oral BID   ??? [Held by provider] DULoxetine  60 mg Oral Daily   ??? [Held by provider] hydroxychloroquine  400 mg Oral Daily     PRN Meds: ipratropium-albuterol, sodium chloride (Inhalant), heparin (porcine), heparin (porcine), phenol, sodium chloride flush, sodium chloride, acetaminophen, glucose, dextrose bolus **OR** dextrose bolus, glucagon (rDNA), dextrose, hydrOXYzine HCl, albuterol, ondansetron **OR** ondansetron, polyethylene glycol      Intake/Output Summary (Last 24 hours) at 02/16/2021 1401  Last data filed at 02/16/2021 0600  Gross per 24 hour   Intake 1700 ml   Output 6050 ml   Net -4350 ml       Physical Exam Performed:    BP 123/89    Pulse (!) 104    Temp 97.9 ??F (36.6 ??C) (Temporal)    Resp 12    Ht 5\' 11"  (1.803 m)    Wt 287 lb 11.2 oz (130.5 kg)    SpO2 99%    BMI 40.13 kg/m??     General  appearance: No apparent distress  HEENT: Pupils equal, round, and reactive to light. Conjunctivae/corneas clear.  Neck: Supple, with full range of motion. No jugular venous distention. Trachea midline.  Respiratory:  Normal respiratory effort. Clear to auscultation, bilaterally without Rales/Wheezes/Rhonchi.  Cardiovascular: Tachycardic with normal rhythm with normal S1/S2 without murmurs, rubs or gallops.  Abdomen: Soft, non-tender, non-distended with normal bowel sounds.  Musculoskeletal: No clubbing, cyanosis or edema bilaterally.  Full range of motion without deformity.  Skin: Skin color, texture, turgor normal.  No rashes or lesions.  Neurologic:  Neurovascularly intact without any focal sensory/motor deficits. Cranial nerves: II-XII intact, grossly non-focal.  Psychiatric:sedated  Capillary Refill: Brisk,3 seconds, normal   Peripheral Pulses: +2 palpable, equal bilaterally       Labs:   Recent Labs     02/15/21  0422 02/15/21  1117 02/16/21  0404   WBC 11.1* 12.5* 13.0*   HGB 13.0 13.7 14.2   HCT 40.5 41.9 43.4   PLT 226 220 210     Recent Labs     02/14/21  1729 02/14/21  1729 02/14/21  1842 02/14/21  1842 02/15/21  0749 02/15/21  1956 02/16/21  0404   NA 143   < > 135*   < > 138 134* 135*   K 2.0*   < > 3.5   < > 3.7 3.4* 4.4   CL 116*   < > 94*   < > 95* 90* 91*   CO2 17*   < > 26   < > 30 29 29    BUN 30*   < > 47*   < > 40* 35* 31*   CREATININE 0.9   < > 1.8*   < > 1.5* 1.5* 1.3*   CALCIUM 4.1*   < > 8.0*   < > 7.8* 8.2* 8.5   PHOS 2.4*  --  4.4  --   --   --   --     < > = values in this interval not displayed.     No results for input(s): AST, ALT, BILIDIR, BILITOT, ALKPHOS in the last 72 hours.  Recent Labs     02/15/21  1117   INR 1.23*     No results for input(s): CKTOTAL, TROPONINI in the last 72 hours.    Urinalysis:      Lab Results   Component Value Date    NITRU Negative 02/09/2021    WBCUA 3-5 02/09/2021    BACTERIA RARE 04/19/2017    RBCUA 3-4 02/09/2021    BLOODU Negative 02/09/2021     SPECGRAV 1.015 02/09/2021    GLUCOSEU Negative 02/09/2021       Radiology:  FL MODIFIED BARIUM SWALLOW W VIDEO   Final Result      Aspiration with thin liquids      Please see speech pathology assessment for dietary recommendations.      XR CHEST PORTABLE   Final Result      Mild left basilar airspace disease.      XR CHEST PORTABLE   Final Result      1. Endotracheal tube in stable position. Right IJ central venous catheter placed with the tip in the distal SVC in satisfactory position   2. Stable cardiac enlargement with no acute segmental consolidation               XR CHEST PORTABLE   Final Result   Impression:       Tip of endotracheal tube is 5 cm the carina.      Lungs are clear. No pneumothorax.      CT CHEST PULMONARY EMBOLISM W CONTRAST   Final Result      1. No evidence of any pulmonary embolism.   2. Four-chamber cardiac enlargement with reflux of contrast into the hepatic veins. This can be seen with elevated right heart pressures.   3. No acute pulmonary process.      XR CHEST PORTABLE   Final Result      Stable cardiac enlargement      No acute consolidation                Assessment/Plan:    Active Hospital Problems    Diagnosis    ??? Paroxysmal atrial fibrillation (HCC) [I48.0]      Priority: Medium   ??? Atrial flutter with rapid ventricular response (HCC) [I48.92]      Priority: Medium   ??? Dilated cardiomyopathy (HCC) [I42.0]      Priority: Medium   ??? Acute respiratory failure with hypoxia (HCC) [J96.01]      Priority: Medium   ??? Lactic acidosis [E87.2]  Priority: Medium   ??? CHF (congestive heart failure), NYHA class I, acute on chronic, combined (HCC) [I50.43]      Priority: Medium   ??? Shortness of breath [R06.02]      Priority: Medium     60 yo female with a PMH of NHL, SLE who presented to the ED with chief complaints of cough and shortness of breath for the past 6 weeks that got worse recently in the past few days  ??  Acute Exacerbation of New-Onset HFrEF   Cardiogenic shock  Recent ECHO on  6/17 with severe dilation of LV, EF of <20%, severe diffuse hypokinesis and grade 3 diastolic dysfunction.  - Off Milrinone since 6/25. Off Levophed  - On Lasix gtt;   - Holding Toprol XL ER 25 mg PO daily  - Requires ischemic w/u, consider when euvolemic??  - Cards following - appreciate recs    Acute hypoxic respiratory failure 2/2 acute HFrEF s/p a flutter ablation (6/24)  - S/p extubation 02/15/2021.    - Off sedation  - On 2 L nasal cannula   - Monitor sats, wean oxygen as tolerated to keep sats more than 90%  ??  New-Onset Atrial Flutter??s/p ablation??(6/24)??  -??S/p ablation w/ EP 6/24  - On Amiodarone/heparin gtt  - Holding Toprol XL ER 25 mg PO daily  - Cards??and EP??c/s - reccs appreciated    AKI  - Likely 2/2 hypoperfusion due to cardiogenic shock  - Avoid nephrotoxic medications  - Monitor creatinine, creatinine 1.3 today    Chronic Medical Conditions:  SLE - Holding Plaquenil 400 mg PO daily, Cymbalta 60 mg PO daily as NPO  OA - Holding Relafen  Morbid obesity BMI 40, complicates overall care  Non-Hodgkin's lymphoma status postchemo 2006-2007    DVT Prophylaxis: Eliquis  Diet: ADULT DIET; Dysphagia - Soft and Bite Sized; Mildly Thick (Nectar)  Code Status: Full Code    PT/OT Eval Status: will order once stable    Dispo -ICU    Oneal Grout, MD

## 2021-02-16 NOTE — Plan of Care (Signed)
Problem: Discharge Planning  Goal: Discharge to home or other facility with appropriate resources  Outcome: Progressing     Problem: Chronic Conditions and Co-morbidities  Goal: Patient's chronic conditions and co-morbidity symptoms are monitored and maintained or improved  Outcome: Progressing     Problem: Respiratory - Adult  Goal: Achieves optimal ventilation and oxygenation  Outcome: Progressing     Problem: Cardiovascular - Adult  Goal: Maintains optimal cardiac output and hemodynamic stability  Outcome: Progressing  Goal: Absence of cardiac dysrhythmias or at baseline  Outcome: Progressing     Problem: Metabolic/Fluid and Electrolytes - Adult  Goal: Electrolytes maintained within normal limits  Outcome: Progressing  Goal: Hemodynamic stability and optimal renal function maintained  Outcome: Progressing     Problem: Skin/Tissue Integrity - Adult  Goal: Skin integrity remains intact  Outcome: Progressing  Flowsheets (Taken 02/16/2021 1011)  Skin Integrity Remains Intact:   Assess vascular access sites hourly   Every 4-6 hours minimum: Change oxygen saturation probe site   Monitor for areas of redness and/or skin breakdown     Problem: Musculoskeletal - Adult  Goal: Return mobility to safest level of function  Outcome: Progressing     Problem: Hematologic - Adult  Goal: Maintains hematologic stability  Outcome: Progressing     Problem: Safety - Adult  Goal: Free from fall injury  Outcome: Progressing  Flowsheets (Taken 02/16/2021 1011)  Free From Fall Injury: Instruct family/caregiver on patient safety     Problem: ABCDS Injury Assessment  Goal: Absence of physical injury  Outcome: Progressing  Flowsheets (Taken 02/16/2021 1011)  Absence of Physical Injury: Implement safety measures based on patient assessment     Problem: Safety - Medical Restraint  Goal: Remains free of injury from restraints (Restraint for Interference with Medical Device)  Description: INTERVENTIONS:  1. Determine that other, less restrictive  measures have been tried or would not be effective before applying the restraint  2. Evaluate the patient's condition at the time of restraint application  3. Inform patient/family regarding the reason for restraint  4. Q2H: Monitor safety, psychosocial status, comfort, nutrition and hydration  Outcome: Progressing     Problem: Pain  Goal: Verbalizes/displays adequate comfort level or baseline comfort level  Outcome: Progressing  Flowsheets (Taken 02/16/2021 1600)  Verbalizes/displays adequate comfort level or baseline comfort level:   Encourage patient to monitor pain and request assistance   Assess pain using appropriate pain scale   Implement non-pharmacological measures as appropriate and evaluate response     Problem: Skin/Tissue Integrity  Goal: Absence of new skin breakdown  Description: 1.  Monitor for areas of redness and/or skin breakdown  2.  Assess vascular access sites hourly  3.  Every 4-6 hours minimum:  Change oxygen saturation probe site  4.  Every 4-6 hours:  If on nasal continuous positive airway pressure, respiratory therapy assess nares and determine need for appliance change or resting period.  Outcome: Progressing     Problem: Nutrition Deficit:  Goal: Optimize nutritional status  Outcome: Progressing     Problem: SLP Adult - Impaired Swallowing  Goal: By Discharge: Advance to least restrictive diet without signs or symptoms of aspiration for planned discharge setting.  See evaluation for individualized goals.  02/16/2021 1008 by Frances Furbish, SLP  Note: Intervention: Speech Evaluation/treatment  SLP completed evaluation. Please refer to notes in EMR.

## 2021-02-16 NOTE — Progress Notes (Signed)
Pt's sister in law updated over telephone. All questions answered.

## 2021-02-16 NOTE — Progress Notes (Signed)
ICU Progress Note    Admit Date: 02/09/2021    CC Vent support     Interval history:  -NAEO  -Had episode of Afib, responded to 1x dose of lopressor  -Off sedation, agitated ON and given Zyprexa  -Mild SOB, CXR WNL - Duonebs and hypertonic saline  -Off levo and sedation  -BP stable  -Remains Afebrile   -Satting well on 1L NC    Medications:     Scheduled Meds:  ??? ipratropium-albuterol  1 ampule Inhalation Q4H WA   ??? sodium chloride (Inhalant)  4 mL Nebulization Q4H WA   ??? sodium chloride flush  5-40 mL IntraVENous 2 times per day   ??? [Held by provider] apixaban  5 mg Oral BID   ??? [Held by provider] spironolactone  50 mg Oral Daily   ??? [Held by provider] metoprolol tartrate  25 mg Oral BID   ??? [Held by provider] DULoxetine  60 mg Oral Daily   ??? [Held by provider] hydroxychloroquine  400 mg Oral Daily     Continuous Infusions:  ??? heparin (PORCINE) Infusion 12 Units/kg/hr (02/16/21 0241)   ??? furosemide (LASIX) 1mg /ml infusion 10 mg/hr (02/16/21 0233)   ??? amiodarone 0.5 mg/min (02/15/21 2000)   ??? sodium chloride     ??? dextrose     ??? norepinephrine Stopped (02/15/21 0945)     PRN Meds:heparin (porcine), heparin (porcine), phenol, sodium chloride flush, sodium chloride, acetaminophen, glucose, dextrose bolus **OR** dextrose bolus, glucagon (rDNA), dextrose, hydrOXYzine HCl, albuterol, ondansetron **OR** ondansetron, polyethylene glycol    Objective:   Vitals:   T-max:  Patient Vitals for the past 8 hrs:   BP Temp Temp src Pulse Resp SpO2 Weight   02/16/21 0700 114/77 -- -- (!) 113 (!) 9 96 % --   02/16/21 0600 124/68 -- -- (!) 115 10 (!) 88 % 287 lb 11.2 oz (130.5 kg)   02/16/21 0500 117/79 -- -- (!) 112 (!) 9 95 % --   02/16/21 0400 115/79 98.4 ??F (36.9 ??C) Temporal 89 14 97 % --   02/16/21 0300 116/77 -- -- 89 14 97 % --   02/16/21 0200 110/80 -- -- (!) 111 11 95 % --   02/16/21 0100 108/76 -- -- (!) 108 15 96 % --       Intake/Output Summary (Last 24 hours) at 02/16/2021 0841  Last data filed at 02/16/2021 0600  Gross per  24 hour   Intake 1700 ml   Output 7550 ml   Net -5850 ml       Physical Exam  Constitutional:       General: She is not in acute distress.     Appearance: Normal appearance. She is obese.   HENT:      Head: Normocephalic and atraumatic.      Mouth/Throat:      Mouth: Mucous membranes are moist.      Pharynx: Oropharynx is clear.   Eyes:      Conjunctiva/sclera: Conjunctivae normal.      Pupils: Pupils are equal, round, and reactive to light.   Cardiovascular:      Rate and Rhythm: Tachycardia present.      Pulses: Normal pulses.      Heart sounds: Normal heart sounds. No murmur heard.  No friction rub. No gallop.    Pulmonary:      Effort: Pulmonary effort is normal.      Breath sounds: Normal breath sounds. No wheezing, rhonchi or rales.   Abdominal:  General: Bowel sounds are normal.      Palpations: Abdomen is soft.      Tenderness: There is no abdominal tenderness.   Musculoskeletal:         General: Normal range of motion.      Cervical back: Normal range of motion and neck supple.      Right lower leg: No edema.      Left lower leg: No edema.   Skin:     General: Skin is warm and dry.      Capillary Refill: Capillary refill takes less than 2 seconds.   Neurological:      General: No focal deficit present.      Mental Status: She is alert and oriented to person, place, and time.      Comments: Intubated and sedated   Psychiatric:      Comments: Patient appears very anxious and verbalizing a fixated belief that she is going to die.            LABS:    CBC:   Recent Labs     02/15/21  0422 02/15/21  1117 02/16/21  0404   WBC 11.1* 12.5* 13.0*   HGB 13.0 13.7 14.2   HCT 40.5 41.9 43.4   PLT 226 220 210   MCV 90.3 89.9 90.1     Renal:    Recent Labs     02/14/21  0443 02/14/21  0443 02/14/21  1729 02/14/21  1729 02/14/21  1842 02/14/21  1842 02/15/21  0749 02/15/21  1907 02/15/21  1956 02/16/21  0404   NA 134*   < > 143   < > 135*   < > 138  --  134* 135*   K 4.4   < > 2.0*   < > 3.5   < > 3.7  --  3.4* 4.4    CL 93*   < > 116*   < > 94*   < > 95*  --  90* 91*   CO2 33*   < > 17*   < > 26   < > 30  --  29 29   BUN 14   < > 30*   < > 47*   < > 40*  --  35* 31*   CREATININE 0.7   < > 0.9   < > 1.8*   < > 1.5*  --  1.5* 1.3*   GLUCOSE 85   < > 71   < > 120*   < > 105*  --  126* 115*   CALCIUM 8.6   < > 4.1*   < > 8.0*   < > 7.8*  --  8.2* 8.5   MG 2.00  --   --   --   --   --   --  1.60*  --  2.50*   PHOS  --   --  2.4*  --  4.4  --   --   --   --   --    ANIONGAP 8   < > 10   < > 15   < > 13  --  15 15    < > = values in this interval not displayed.     Hepatic:   Recent Labs     02/14/21  1729 02/14/21  1842   LABALBU 1.7* 3.5     Troponin:   No results for input(s): TROPONINI in the last 72 hours.  BNP:  No results for input(s): BNP in the last 72 hours.  Lipids: No results for input(s): CHOL, HDL in the last 72 hours.    Invalid input(s): LDLCALCU, TRIGLYCERIDE  ABGs:    Recent Labs     02/14/21  1129 02/15/21  1119   PHART 7.441 7.478*   PCO2ART 41.7 44.9   PO2ART 102.0 124.0*   HCO3ART 28 33*   BEART 3.8* 8.2*   O2SATART 99 99   TCO2ART 30 35       INR:   Recent Labs     02/15/21  1117   INR 1.23*     Lactate:   No results for input(s): LACTATE in the last 72 hours.  Cultures:  -----------------------------------------------------------------  RAD:   XR CHEST PORTABLE   Final Result      Mild left basilar airspace disease.      XR CHEST PORTABLE   Final Result      1. Endotracheal tube in stable position. Right IJ central venous catheter placed with the tip in the distal SVC in satisfactory position   2. Stable cardiac enlargement with no acute segmental consolidation               XR CHEST PORTABLE   Final Result   Impression:       Tip of endotracheal tube is 5 cm the carina.      Lungs are clear. No pneumothorax.      CT CHEST PULMONARY EMBOLISM W CONTRAST   Final Result      1. No evidence of any pulmonary embolism.   2. Four-chamber cardiac enlargement with reflux of contrast into the hepatic veins. This can be  seen with elevated right heart pressures.   3. No acute pulmonary process.      XR CHEST PORTABLE   Final Result      Stable cardiac enlargement      No acute consolidation                  Assessment/Plan:   60 yo female with a PMH of NHL, SLE who presented to the ED with chief complaints of cough and shortness of breath for the past 6 weeks that got worse recently in the past few days  ??  Acute Exacerbation of New-Onset HFrEF   Cardiogenic shock  AKI  Recent ECHO on 6/17 with severe dilation of LV, EF of <20%, severe diffuse hypokinesis and grade 3 diastolic dysfunction.  -Lasix gtt @ 10  -Holding Toprol XL ER 25 mg PO daily  -Holding Aldactone 50 mg daily, gtt equivalent   -requires ischemic w/u, consider when euvolemic??  -Cards c/s - appreciate recs  -s/p AFL ablation  -Off levo  -UOP 7.5L, Cr continues to downtrend    New-Onset Atrial Flutter s/p ablation   Patient endorses SOB however no chest pain or palpitations. She states she feels anxious. Tachycardic on exam.   Arrived to ED in Atrial Flutter based on EKG obtained at admission. Given Metoprolol 2.5 mg once on admission. ??  -ablation w/ EP 6/24  -Cards??and EP??c/s - reccs appreciated  ??  Anxiety  Patient denies any history of. Appears very anxious this admission. Consider related to Atrial flutter and its ensuing symptoms.  -Patient tolerated Zyprexa overnight    Acute Respiratory Failure 2/2 acute HFrEF s/p a flutter ablation (Resolved)  ??  Chronic Medical Conditions:  SLE - holding Plaquenil 400 mg PO daily, Cymbalta 60 mg PO daily as NPO  OA -  holding Relafen    Code Status: FULL  FEN: NPO (SLP ordered)   PPX: Hep gtt  DISPO: ICU, pending transfer     This patient has been staffed and discussed with Dr. Jonah Blue, MD, PGY-1  02/16/21  8:41 AM     Patient examined, findings as discussed with Dr. Dimas Aguas.  Agree with assessment and plan.  Respiratory failure resolved.  Does not have significant oxygen requirement.  Continuing to  treat heart failure.

## 2021-02-16 NOTE — Procedures (Signed)
INSTRUMENTAL SWALLOW REPORT  MODIFIED BARIUM SWALLOW    NAME: Molly [name]   DOB: 09-28-1960  MRN: [id number]       Date of Eval: 02/16/2021     Ordering Physician: Caprice Red  Radiologist: Laurance Flatten     Referring Diagnosis(es): Referring Diagnosis: SOB    Past Medical History:  has a past medical history of Amenorrhea, Cancer ([place]), Chronic combined systolic and diastolic CHF, NYHA class 2 ([place]), History of non-Hodgkin's lymphoma, and Systemic lupus.  Past Surgical History:  has a past surgical history that includes LEEP; knee surgery (Left); Knee Arthroplasty (Left, 10/25/2016); and joint replacement.           Type of Study: Initial MBS       Recent Chest Xray 02/15/21  Mild left basilar airspace disease    Patient Complaints/Reason for Referral:  Molly [name] was referred for a MBS to assess the efficiency of his/her swallow function, assess for aspiration, and to make recommendations regarding safe dietary consistencies, effective compensatory strategies, and safe eating environment.  Patient complaints: pt without complaints    Onset of problem:   Date of Onset: 02/09/21      Behavior/Cognition/Vision/Hearing:  Behavior/Cognition: Alert;Cooperative;Pleasant mood  Vision: Within Functional Limits  Hearing: Within functional limits    Impressions:  Pt presents with mild oral/pharyngeal phase dysphagia  Oral-pt had no difficulty with A-P transport of the bolus with any consistency.  pt functional mastication with solids but there is a concern for fatigue given that pt is deconditioned. Pt with intermittent premature spillage over the base of the tongue with thin by cup  Pharyngeal- suspect impaired subglottic pressure given weak vocal quality along with impaired tongue base retraction and reduced hyolaryngeal excursion which resulted in aspiration of thin by cup with a cough reflex with first trial.  Cough was strong but not adequate enough to clear the material from the airway. With subsequent trials with thin  by cup and straw, pt with deep penetration to the cords with spontaneous clearing. Attempted bolus hold and chin tuck strategies but this did not aid in airway protection. With trials of nectar by cup and straw, shallow/flash penetration with 100% spontaneous clearing was noted. With trials with pudding and cracker, there was minimal residue remaining in the valleculae after the swallow which was cleared with a liquid wash. Although there was only one instance of aspiration with thin liquids, there were multiple instances of deep penetration placing pt at risk for aspiration. Given pt's aphonia and recent extubation, recommend dysphagia III/soft and bite sized diet with nectar/mildly thick liquids at this time. Will continue to assess for diet upgrade in the days to come.     Treatment Dx and ICD 10: 13.12   Patient Position: Lateral       Consistencies Administered: Regular;Pureed;Thin straw;Thin cup;Mildly Thick cup;Mildly Thick straw    Dysphagia Outcome Severity Scale: Level 5: Mild dysphagia- Distant supervision. May need one diet consistency restricted  Penetration-Aspiration Scale (PAS): 7 - Material enters the airway, passes below the vocal folds, and is not ejected from the trachea despite effort    Recommended Diet:  Solid consistency: Soft and Bite-Sized  Liquid consistency: Mildly Thick  Liquid administration via: Cup;Straw    Medication administration: Meds in puree    Safe Swallow Protocol:     Compensatory Swallowing Strategies : Eat/Feed slowly;Upright as possible for all oral intake;Alternate solids and liquids;Effortful swallow;Small bites/sips    Recommendations/Treatment  Requires SLP Intervention: Yes   Diet recommendation- Dysphagia  III/soft and bite sized with nectar/mildly thick liquids  Medication in puree or with nectar/mildly thick liquids      D/C Recommendations: To be determined  Postural Changes and/or Swallow Maneuvers: Upright      Recommended Exercises:    Therapeutic Interventions:  Diet tolerance monitoring;Patient/Family education  Education: Images and recommendations were reviewed with pt following this exam.   Patient Education: Results of MBS  Patient Education Response: Visual merchandiser Devices in place: Yes (staff present to monitor for safety)  Type of devices: Call light within reach  Restraints Initially in Place: No      Goals:    1-The patient will tolerate instrumental swallowing procedure  6/28- goal met  ??  2- The pt/caregiver will demonstrate understanding of swallowing recommendations and concerns.  6/28-   The pt and family member were educated to purpose of the visit, anatomy and physiology of the swallow, impact intubation can have on swallowing function, role vocal cords play in airway protectionconcerns for aspiration, recommendation for modified barium swallow and a description of the procedure. Both stated comprehension and pt was in agreement with MBS. con't goal  6/2- second session- the pt and RN were educated to the results of the MBS via review of the video from the study as well as diet recommendations, rational for nectar thick liquids, swallowing strategies and plan to continue to assess for possible diet advancement. Both stated comprehension. con't goal     New goal  3-The patient will tolerate least restrictive diet without s/s aspiration or respiratory decline.       Esophageal Phase  Esophageal Screen: WFL        Pain      Pain Level: 0      Plan:  Continue goals per POC  Recommended diet: Dysphagia III/soft and bite sized with nectar/mildly thick liquids-Make NPO if s/s aspiration emerge and alert SLP  Total treatment time:17  Pt's discharge plan:to home   Discharge Plan: To be determined closer to discharge  Discussed with RN, Caryl Pina   Needs within reach.       Madilyn Fireman, [place], [facility]- SLP  [phone]  Pg # [phone]  This document will serve as a discharge summary if pt discharge before next treatment   session        Therapy  Time:   Individual Concurrent Group Co-treatment   Time In 1109         Time Out 1126         Minutes 17

## 2021-02-16 NOTE — Plan of Care (Signed)
Intervention: Speech Evaluation/treatment  SLP completed evaluation. Please refer to notes in EMR.

## 2021-02-16 NOTE — Other (Signed)
RT Inhaler-Nebulizer Bronchodilator Protocol Note    There is a bronchodilator order in the chart from a provider indicating to follow the RT Bronchodilator Protocol and there is an ???Initiate RT Inhaler-Nebulizer Bronchodilator Protocol??? order as well (see protocol at bottom of note).    CXR Findings:  XR CHEST PORTABLE    Result Date: 02/15/2021  Mild left basilar airspace disease.      The findings from the last RT Protocol Assessment were as follows:   History Pulmonary Disease: None or smoker <15 pack years  Respiratory Pattern: Regular pattern and RR 12-20 bpm  Breath Sounds: Clear breath sounds  Cough: Strong, spontaneous, non-productive  Indication for Bronchodilator Therapy: Decreased or absent breath sounds  Bronchodilator Assessment Score: 0    Aerosolized bronchodilator medication orders have been revised according to the RT Inhaler-Nebulizer Bronchodilator Protocol below.    Respiratory Therapist to perform RT Therapy Protocol Assessment initially then follow the protocol.  Repeat RT Therapy Protocol Assessment PRN for score 0-3 or on second treatment, BID, and PRN for scores above 3.    No Indications - adjust the frequency to every 6 hours PRN wheezing or bronchospasm, if no treatments needed after 48 hours then discontinue using Per Protocol order mode.     If indication present, adjust the RT bronchodilator orders based on the Bronchodilator Assessment Score as indicated below.  Use Inhaler orders unless patient has one or more of the following: on home nebulizer, not able to hold breath for 10 seconds, is not alert and oriented, cannot activate and use MDI correctly, or respiratory rate 25 breaths per minute or more, then use the equivalent nebulizer order(s) with same Frequency and PRN reasons based on the score.  If a patient is on this medication at home then do not decrease Frequency below that used at home.    0-3 - enter or revise RT bronchodilator order(s) to equivalent RT Bronchodilator order  with Frequency of every 4 hours PRN for wheezing or increased work of breathing using Per Protocol order mode.        4-6 - enter or revise RT Bronchodilator order(s) to two equivalent RT bronchodilator orders with one order with BID Frequency and one order with Frequency of every 4 hours PRN wheezing or increased work of breathing using Per Protocol order mode.        7-10 - enter or revise RT Bronchodilator order(s) to two equivalent RT bronchodilator orders with one order with TID Frequency and one order with Frequency of every 4 hours PRN wheezing or increased work of breathing using Per Protocol order mode.       11-13 - enter or revise RT Bronchodilator order(s) to one equivalent RT bronchodilator order with QID Frequency and an Albuterol order with Frequency of every 4 hours PRN wheezing or increased work of breathing using Per Protocol order mode.      Greater than 13 - enter or revise RT Bronchodilator order(s) to one equivalent RT bronchodilator order with every 4 hours Frequency and an Albuterol order with Frequency of every 2 hours PRN wheezing or increased work of breathing using Per Protocol order mode.     RT to enter RT Home Evaluation for COPD & MDI Assessment order using Per Protocol order mode.    Electronically signed by Fredirick Maudlin, RCP on 02/16/2021 at 9:07 AM

## 2021-02-16 NOTE — Progress Notes (Deleted)
[facility]  Advanced CHF/Pulmonary Hypertension   Cardiac Evaluation      Lambert Keto  Date of Birth:  February 10, 1961    Date of Visit:  02/12/21    ***    History of Present Illness:  Molly [name] is a 60 y.o. female who presents from referral from PCP Lamona Curl, CNP for consultation and management of congestive heart failure. She has a past medical history of CHF, atrial flutter, cardiomyopathy, hyperlipidemia,  non- hodgkin's lymphoma s/p chemotherapy, systemic lupus, and rheumatoid arthritis. She saw her PCP and had complaints of shortness of breath. She had a chest xray 01/29/2021 that showed cardiomeagly. Her Pro-BNP 01/29/2021 was 7,646. She had an echo 02/05/2021 that demonstrated an LVEF <20% with grade III diastolic dysfunction, moderate MR & TR. She was sent from PCP office to [facility] ED 02/09/2021. She was found to be in atrial flutter on admission. She underwent atrial flutter RFA  ***   Today, 02/16/2021, **    Allergies   Allergen Reactions   ??? Sulfa Antibiotics Rash     No current facility-administered medications for this visit.     No current outpatient medications on file.     Facility-Administered Medications Ordered in Other Visits   Medication Dose Route Frequency Provider Last Rate Last Admin   ??? lactated ringers infusion   IntraVENous Continuous PRN Damita Dunnings, APRN - CRNA   New Bag at 02/12/21 1143   ??? phenylephrine (NEO-SYNEPHRINE) injection   IntraVENous PRN Damita Dunnings, APRN - CRNA   100 mcg at 02/12/21 1241   ??? sodium chloride flush 0.9 % injection 5-40 mL  5-40 mL IntraVENous 2 times per day Altamese Dilling, DO       ??? sodium chloride flush 0.9 % injection 5-40 mL  5-40 mL IntraVENous PRN Altamese Dilling, DO       ??? 0.9 % sodium chloride infusion   IntraVENous PRN Altamese Dilling, DO       ??? ondansetron [facility]) injection 4 mg  4 mg IntraVENous Once PRN Altamese Dilling, DO       ??? HYDROmorphone (DILAUDID) injection 0.25 mg  0.25  mg IntraVENous Q5 Min PRN Altamese Dilling, DO       ??? HYDROmorphone (DILAUDID) injection 0.5 mg  0.5 mg IntraVENous Q5 Min PRN Altamese Dilling, DO       ??? labetalol (NORMODYNE;TRANDATE) injection 10 mg  10 mg IntraVENous Q15 Min PRN Altamese Dilling, DO        Or   ??? hydrALAZINE (APRESOLINE) injection 10 mg  10 mg IntraVENous Q15 Min PRN Altamese Dilling, DO       ??? prochlorperazine (COMPAZINE) injection 5 mg  5 mg IntraVENous Once PRN Altamese Dilling, DO       ??? oxyCODONE (ROXICODONE) immediate release tablet 5 mg  5 mg Oral PRN Altamese Dilling, DO        Or   ??? oxyCODONE (ROXICODONE) immediate release tablet 10 mg  10 mg Oral PRN Altamese Dilling, DO       ??? propofol injection   IntraVENous PRN Damita Dunnings, APRN - CRNA   100 mg at 02/12/21 1207   ??? lidocaine 2 % injection   IntraVENous PRN Damita Dunnings, APRN - CRNA   50 mg at 02/12/21 1207   ??? rocuronium (ZEMURON) injection   IntraVENous PRN Damita Dunnings, APRN - CRNA   5 mg at 02/12/21 1207   ???  succinylcholine chloride (ANECTINE) injection   IntraVENous PRN Damita Dunnings, APRN - CRNA   200 mg at 02/12/21 1208   ??? heparin (porcine) injection   IntraVENous PRN Damita Dunnings, APRN - CRNA   5,000 Units at 02/12/21 1220   ??? apixaban (ELIQUIS) tablet 5 mg  5 mg Oral BID Lenna Gilford, MD   5 mg at 02/11/21 2120   ??? spironolactone (ALDACTONE) tablet 50 mg  50 mg Oral Daily Laurette Schimke, MD   50 mg at 02/12/21 0900   ??? metoprolol tartrate (LOPRESSOR) tablet 25 mg  25 mg Oral BID Laurette Schimke, MD   25 mg at 02/12/21 0900   ??? melatonin disintegrating tablet 5 mg  5 mg Oral Nightly Margarita Sermons, MD   5 mg at 02/11/21 2120   ??? hydrOXYzine HCl (ATARAX) tablet 10 mg  10 mg Oral TID PRN Barbee Cough, MD   10 mg at 02/11/21 2120   ??? albuterol (PROVENTIL) nebulizer solution 2.5 mg  2.5 mg Nebulization Q4H PRN Barbee Cough, MD       ??? DULoxetine (CYMBALTA) extended release capsule 60 mg  60 mg Oral Daily Barbee Cough, MD   60 mg at 02/11/21 2120   ??? hydroxychloroquine (PLAQUENIL) tablet 400 mg  400 mg Oral Daily Barbee Cough, MD   400 mg at 02/11/21 2120   ??? sodium chloride flush 0.9 % injection 5-40 mL  5-40 mL IntraVENous 2 times per day Barbee Cough, MD   10 mL at 02/11/21 2135   ??? sodium chloride flush 0.9 % injection 5-40 mL  5-40 mL IntraVENous PRN Barbee Cough, MD   10 mL at 02/11/21 0647   ??? 0.9 % sodium chloride infusion   IntraVENous PRN Barbee Cough, MD       ??? ondansetron (ZOFRAN-ODT) disintegrating tablet 4 mg  4 mg Oral Q8H PRN Barbee Cough, MD        Or   ??? ondansetron (ZOFRAN) injection 4 mg  4 mg IntraVENous Q6H PRN Barbee Cough, MD       ??? polyethylene glycol (GLYCOLAX) packet 17 g  17 g Oral Daily PRN Barbee Cough, MD       ??? acetaminophen (TYLENOL) tablet 650 mg  650 mg Oral Q6H PRN Barbee Cough, MD        Or   ??? acetaminophen (TYLENOL) suppository 650 mg  650 mg Rectal Q6H PRN Barbee Cough, MD       ??? [Held by provider] furosemide (LASIX) 100 mg in dextrose 5 % 100 mL infusion  10 mg/hr IntraVENous Continuous Barbee Cough, MD   Paused at 02/10/21 2334       Past Medical History:   Diagnosis Date   ??? Amenorrhea    ??? Cancer (HCC)    ??? History of non-Hodgkin's lymphoma 2006    treated with radiation and chemo   ??? Obesity    ??? Systemic lupus     followed by Dr. Harley Hallmark     Past Surgical History:   Procedure Laterality Date   ??? JOINT REPLACEMENT     ??? KNEE ARTHROPLASTY Left 10/25/2016    left total knee replacement using computer navigation [facility])   ??? KNEE SURGERY Left     ACL and meniscus repair   ??? LEEP      in 1990's     Family History   Problem Relation Age of Onset   ??? Dementia Mother    ??? Thyroid Disease Mother    ???  Other Father         paraplegic after farming accident   ??? High Cholesterol Sister    ??? Cancer Sister 66        Breast CA   ??? Thyroid Disease Sister    ??? High Cholesterol Brother    ??? High Cholesterol Brother    ??? Rheum Arthritis Neg Hx    ??? Osteoarthritis Neg Hx    ??? Asthma Neg Hx     ??? Breast Cancer Neg Hx    ??? Diabetes Neg Hx    ??? Heart Failure Neg Hx    ??? Hypertension Neg Hx    ??? Migraines Neg Hx    ??? Ovarian Cancer Neg Hx    ??? Rashes/Skin Problems Neg Hx    ??? Seizures Neg Hx    ??? Stroke Neg Hx      Social History     Socioeconomic History   ??? Marital status: Married     Spouse name: Not on file   ??? Number of children: Not on file   ??? Years of education: Not on file   ??? Highest education level: Not on file   Occupational History   ??? Not on file   Tobacco Use   ??? Smoking status: Former Smoker     Packs/day: 0.50     Years: 18.00     Pack years: 9.00     Types: Cigarettes     Quit date: 2008     Years since quitting: 14.4   ??? Smokeless tobacco: Never Used   Vaping Use   ??? Vaping Use: Never used   Substance and Sexual Activity   ??? Alcohol use: Yes     Alcohol/week: 1.0 standard drink     Types: 1 Shots of liquor per week     Comment: 2x/week   ??? Drug use: No   ??? Sexual activity: Not Currently   Other Topics Concern   ??? Not on file   Social History Narrative   ??? Not on file     Social Determinants of Health     Financial Resource Strain:    ??? Difficulty of Paying Living Expenses: Not on file   Food Insecurity:    ??? Worried About Running Out of Food in the Last Year: Not on file   ??? Ran Out of Food in the Last Year: Not on file   Transportation Needs:    ??? Lack of Transportation (Medical): Not on file   ??? Lack of Transportation (Non-Medical): Not on file   Physical Activity:    ??? Days of Exercise per Week: Not on file   ??? Minutes of Exercise per Session: Not on file   Stress:    ??? Feeling of Stress : Not on file   Social Connections:    ??? Frequency of Communication with Friends and Family: Not on file   ??? Frequency of Social Gatherings with Friends and Family: Not on file   ??? Attends Religious Services: Not on file   ??? Active Member of Clubs or Organizations: Not on file   ??? Attends Banker Meetings: Not on file   ??? Marital Status: Not on file   Intimate Partner Violence:    ??? Fear  of Current or Ex-Partner: Not on file   ??? Emotionally Abused: Not on file   ??? Physically Abused: Not on file   ??? Sexually Abused: Not on file   Housing Stability:    ???  Unable to Pay for Housing in the Last Year: Not on file   ??? Number of Places Lived in the Last Year: Not on file   ??? Unstable Housing in the Last Year: Not on file       Review of Systems:   ?? Constitutional: there has been no unanticipated weight loss. There's been no change in energy level, sleep pattern, or activity level.     ?? Eyes: No visual changes or diplopia. No scleral icterus.  ?? ENT: No Headaches, hearing loss or vertigo. No mouth sores or sore throat.  ?? Cardiovascular: Reviewed in HPI  ?? Respiratory: No cough or wheezing, no sputum production. No hematemesis.    ?? Gastrointestinal: No abdominal pain, appetite loss, blood in stools. No change in bowel or bladder habits.  ?? Genitourinary: No dysuria, trouble voiding, or hematuria.  ?? Musculoskeletal:  No gait disturbance, weakness or joint complaints.  ?? Integumentary: No rash or pruritis.  ?? Neurological: No headache, diplopia, change in muscle strength, numbness or tingling. No change in gait, balance, coordination, mood, affect, memory, mentation, behavior.  ?? Psychiatric: No anxiety, no depression.  ?? Endocrine: No malaise, fatigue or temperature intolerance. No excessive thirst, fluid intake, or urination. No tremor.  ?? Hematologic/Lymphatic: No abnormal bruising or bleeding, blood clots or swollen lymph nodes.  ?? Allergic/Immunologic: No nasal congestion or hives.    Physical Examination:    ***  Constitutional and General Appearance:   WD/WN in NAD  HEENT:  NC/AT  PERLA  No problems with hearing  Skin:  Warm, dry  Respiratory:  ?? Normal excursion and expansion without use of accessory muscles  ?? Resp Auscultation: Normal breath sounds without dullness  Cardiovascular:  ?? The apical impulses not displaced  ?? Heart tones are crisp and normal  ?? Cervical veins are not engorged  ?? The  carotid upstroke is normal in amplitude and contour without delay or bruit  ?? JVP less than 8 cm H2O  RRR with nl S1 and S2 without m,r,g  ?? Peripheral pulses are symmetrical and full  ?? There is no clubbing, cyanosis of the extremities.  ?? No edema  ?? Femoral Arteries: 2+ and equal  ?? Pedal Pulses: 2+ and equal   Neck:  ?? No thyromegaly  Abdomen:  ?? No masses or tenderness  ?? Liver/Spleen: No Abnormalities Noted  Neurological/Psychiatric:  ?? Alert and oriented in all spheres  ?? Moves all extremities well  ?? Exhibits normal gait balance and coordination  ?? No abnormalities of mood, affect, memory, mentation, or behavior are noted      Echo 02/05/2021:   Summary   Left ventricular cavity size is severely dilated.   Normal left ventricular wall thickness.   Ejection fraction is visually estimated to be <20%.   There is severe diffuse hypokinesis.   Global Longitiudinal Strain= -5.0%   Grade III diastolic dysfunction with elevated LV filling pressures.   Moderate thickening of leaflets of mitral valve.   moderate mitral regurgitation.   Mildly dilated right ventricle.   Right ventricular systolic function is severely reduced.   The tricuspid valve was not well visualized.   moderate tricuspid regurgitation.   The right atrium is moderately dilated.   IVC size is dilated (>2.1 cm) and collapses < 50% with respiration   consistent with elevated RA pressure (15 mmHg).   Estimated pulmonary artery systolic pressure is mildly elevated at 40 mmHg   assuming a right atrial pressure of 15 mmHg.  Assessment:    ***    Plan:  1. ***      ***    ***    ***      QUALITY MEASURES  1. Tobacco Cessation Counseling: {YES/NO:19732}  2. Retake of BP if >140/90:   {YES/NO:19732}  3. Documentation to PCP/referring for new patient:  Sent to PCP at close of office visit  4. CAD patient on anti-platelet: {YES/NO:19732}  5. CAD patient on STATIN therapy:  {YES/NO:19732}  6. Patient with CHF and aFib on anticoagulation:  {YES/NO:19732}          I appreciate the opportunity of cooperating in the care of this patient.    Henrene Pastor, M.D., [facility]

## 2021-02-16 NOTE — Progress Notes (Signed)
Cardiology Consult Service  Daily Progress Note        Admit Date:  02/09/2021  Primary cardiologist: Dr Harrison Mons / Dr. Clarise Cruz    Reason for Consultation/Chief Complaint: AHF, PAFRVR    Subjective:     Molly [name] is a 60 y.o. female with a past medical history of morbid obesity (BMI 43), non-Hodgkin's lymphoma status postchemotherapy 2006- 2007, SLE, RA.  Patient denies any previous cardiac history.  Patient reports she had MUGA test prior to chemotherapy and during treatment, last MUGA test was 2 years after her last chemotherapy and cardiac function was reportedly normal.  ??  Patient presented to the emergency room on 6/21 with progressively worse shortness of breath, lower extremity edema, weight gain, fatigue over the last 6 weeks.  She admits to occasional fluttering in her chest which is very mild.  At the emergency room she was afebrile, tachycardic in AF RVR/atrial flutter RVR, hypertensive, did not require any supplemental oxygen.  Electrolytes normal, proBNP 4700, troponin x1 negative, CBC normal, TSH normal, chest x-ray unremarkable, CTA chest negative for PE, significant biventricular dilatation consistent with cardiomyopathy.  ??  ECG 02/09/2021: Atrial flutter/coarse A. fib with RVR 138 bpm.  ??  Patient was admitted for acute heart failure and was placed on Lasix drip 10 mg an hour after receiving Lasix 60 mg IV x1.  Today I's and O's -3 L, weight down 2 pounds, creatinine stable 1.9, K3.4, magnesium 1.9.  Telemetry was personally reviewed, reveals A. fib with RVR 110-120 bpm.  ??  Echo 02/05/2021: LVD 6.8 cm, LVEF less than 20%, diastolic grade 3, increased LVEDP, severe RV dilatation and dysfunction, moderate MR/TR, RVSP 40 (15).  Images were personally reviewed.  ??  S/p atrial flutter ablation 6/24.  Patient could not be extubated after the procedure due to hypoxia and therefore was transferred intubated to the ICU.    Interval history:  He was extubated on 6/27.  She is now on 1 L of supplemental  oxygen.  Creatinine improving at 1.3 from 1.5.  N.p.o. for modified barium swallow, she is now receiving dysphagia diet.    Objective:     Medications:  ??? melatonin  5 mg Oral Nightly   ??? digoxin  125 mcg IntraVENous Once   ??? sodium chloride flush  5-40 mL IntraVENous 2 times per day   ??? apixaban  5 mg Oral BID   ??? spironolactone  50 mg Oral Daily   ??? metoprolol tartrate  25 mg Oral BID   ??? [Held by provider] DULoxetine  60 mg Oral Daily   ??? [Held by provider] hydroxychloroquine  400 mg Oral Daily       IV drips:  ??? furosemide (LASIX) 1mg /ml infusion 10 mg/hr (02/16/21 1249)   ??? amiodarone 0.5 mg/min (02/16/21 1247)   ??? sodium chloride     ??? dextrose     ??? norepinephrine Stopped (02/15/21 0945)       PRN:  ipratropium-albuterol, sodium chloride (Inhalant), heparin (porcine), heparin (porcine), phenol, sodium chloride flush, sodium chloride, acetaminophen, glucose, dextrose bolus **OR** dextrose bolus, glucagon (rDNA), dextrose, hydrOXYzine HCl, albuterol, ondansetron **OR** ondansetron, polyethylene glycol    Vitals:    02/16/21 1000 02/16/21 1100 02/16/21 1200 02/16/21 1300   BP: (!) 126/93 (!) 131/104 122/83 123/89   Pulse: (!) 123 (!) 115 (!) 112 (!) 104   Resp: 14 11 (!) 8 12   Temp:   97.9 ??F (36.6 ??C)    TempSrc:   Temporal  SpO2:   99%    Weight:       Height:           Intake/Output Summary (Last 24 hours) at 02/16/2021 1511  Last data filed at 02/16/2021 0600  Gross per 24 hour   Intake 1700 ml   Output 6050 ml   Net -4350 ml     I/O last 3 completed shifts:  In: 1700 [I.V.:1700]  Out: 9150 [Urine:9150]  Wt Readings from Last 3 Encounters:   02/16/21 287 lb 11.2 oz (130.5 kg)   02/01/21 (!) 336 lb (152.4 kg)   01/29/21 (!) 331 lb (150.1 kg)       Admit Wt: Weight: (!) 315 lb (142.9 kg)   Todays Wt: Weight: 287 lb 11.2 oz (130.5 kg)    TELEMETRY personally reviewed:  NSR    Physical Exam:         General Appearance:  Sedated, intubated.    Head:  Normocephalic, without obvious abnormality, atraumatic   Eyes:   PERRL, conjunctiva/corneas clear EOM intact  Ears normal   Throat no lesions       Nose: Nares normal, no drainage or sinus tenderness   Throat: Lips, mucosa, and tongue normal   Neck: Supple, symmetrical, trachea midline, no adenopathy, thyroid: not enlarged, symmetric, no tenderness/mass/nodules, no carotid bruit.        Lungs:   Normal respiratory rate, lungs with ronchi bilaterally.    Chest Wall:  No tenderness or deformity   Heart:  Regular rhythm, rate is controlled, S1, S2 normal, there is no murmur, there is no rub or gallop, cannot assess jvd, 2+ bilateral lower extremity edema   Abdomen:   Soft, non-tender, bowel sounds active all four quadrants,  no masses, no organomegaly       Extremities: Extremities normal, atraumatic, no cyanosis.    Pulses: 2+ and symmetric   Skin: Skin color, texture, turgor normal, no rashes or lesions   Pysch: Normal mood and affect   Neurologic: Normal gross motor and sensory exam.  Cranial nerves intact       Labs:   Recent Labs     02/14/21  0443 02/14/21  1729 02/15/21  0749 02/15/21  1907 02/15/21  1956 02/16/21  0404   NA 134*   < > 138  --  134* 135*   K 4.4   < > 3.7  --  3.4* 4.4   BUN 14   < > 40*  --  35* 31*   CREATININE 0.7   < > 1.5*  --  1.5* 1.3*   CL 93*   < > 95*  --  90* 91*   CO2 33*   < > 30  --  29 29   GLUCOSE 85   < > 105*  --  126* 115*   CALCIUM 8.6   < > 7.8*  --  8.2* 8.5   MG 2.00  --   --  1.60*  --  2.50*    < > = values in this interval not displayed.     Recent Labs     02/15/21  0422 02/15/21  0422 02/15/21  1117 02/15/21  1117 02/16/21  0404   WBC 11.1*  --  12.5*  --  13.0*   HGB 13.0  --  13.7  --  14.2   HCT 40.5  --  41.9  --  43.4   PLT 226  --  220  --  210  MCV 90.3   < > 89.9   < > 90.1    < > = values in this interval not displayed.     No results for input(s): CHOLTOT, TRIG, HDL, CHOLHDL, LDL in the last 72 hours.    Invalid input(s): LIPIDCOMM, VLDCHOL  Recent Labs     02/15/21  1117   INR 1.23*     No results for input(s):  CKTOTAL, CKMB, CKMBINDEX, TROPONINI in the last 72 hours.  No results for input(s): BNP in the last 72 hours.  No results for input(s): NTPROBNP in the last 72 hours.  No results for input(s): TSH in the last 72 hours.    Imaging:       Assessment & Plan:     1. ??Acute and new HFrEF. ??It is of unknown etiology, could be ischemic (severe CAD) versus nonischemic (PAF RVR, less likely??postchemotherapy, viral). ??Patient is volume overloaded and hypotensive requiring pressor support.   2. ??Non-Hodgkin's lymphoma status post chemotherapy, now in remission  3. ??SLE  4. ??Rheumatoid arthritis  5. ??PAFL RVR. ??Patient appears to be in atrial flutter/coarse atrial fibrillation with RVR. ??It is of unknown duration and she has not been on any anticoagulation. ??She was minimally symptomatic (minimal palpitations). Patient was started on anticoagulation and EP consulted; patient had AFL ablation on 6/24, remained in sinus post procedure.  6.  SVT 6/26, now in sinus after dig bolus on amio drip.   ??  ??  ??  - Increase Lasix 20 mg/h, resume spiro 50 daily  - Continue with amiodarone gtt; will not change to p.o. amiodarone until patient able to tolerate low-dose Lopressor and rhythm stable sinus on telemetry x 24 hours.   - Will resume lopressor 25 bid  - Will give one dose of dig 125 mcg IV now.   - Strict I's and O's every shift and standing weights if possible, low-salt diet, daily BMP with reflex to Mg (correct lytes for goals K >4.0 and Mg > 2.0) and wean supplemental oxygen to off (or down to baseline supplemental oxygen requirements) for sats greater than 92-94%.  - Patient was on heparin gtt for PAFL s/p ablation; will change to eliquis 5 bid.   - EP following.               Due to the high probability of clinically significant life threating deterioration of the patient's condition that required my urgent intervention, a total critical care time 40 minutes was used. This time excludes any time that may have been spent  performing procedures. This includes but not limited to vital sign monitoring, telemetry monitoring, continuous pulse oximety, IV medication, clinical response to the IV medications, documentation time, consultation time, interpretation of lab data, review of nursing notes and old record review.       I have personally reviewed the reports and images of labs, radiological studies, cardiac studies including ECG's and telemetry, current and old medical records. The note was completed using EMR and Dragon dictation system. Every effort was made to ensure accuracy; however, inadvertent computerized transcription errors may be present.    All questions and concerns were addressed to the patient/family. Alternatives to my treatment were discussed.     Thank you for allowing to [place] to participate in the care or Tamberly A Lamay. Please call our service with questions.    Laurette Schimke, MD, [facility], FHFSA  [facility]  [name]  [address]  [place] [address]  Ph: [phone]  Fax: [phone]

## 2021-02-16 NOTE — Plan of Care (Signed)
Problem: Discharge Planning  Goal: Discharge to home or other facility with appropriate resources  Outcome: Progressing  Flowsheets  Taken 02/16/2021 0000  Discharge to home or other facility with appropriate resources: Arrange for needed discharge resources and transportation as appropriate  Taken 02/15/2021 2000  Discharge to home or other facility with appropriate resources: Arrange for needed discharge resources and transportation as appropriate     Problem: Chronic Conditions and Co-morbidities  Goal: Patient's chronic conditions and co-morbidity symptoms are monitored and maintained or improved  Outcome: Progressing  Flowsheets  Taken 02/16/2021 0000  Care Plan - Patient's Chronic Conditions and Co-Morbidity Symptoms are Monitored and Maintained or Improved: Monitor and assess patient's chronic conditions and comorbid symptoms for stability, deterioration, or improvement  Taken 02/15/2021 2000  Care Plan - Patient's Chronic Conditions and Co-Morbidity Symptoms are Monitored and Maintained or Improved: Monitor and assess patient's chronic conditions and comorbid symptoms for stability, deterioration, or improvement     Problem: Respiratory - Adult  Goal: Achieves optimal ventilation and oxygenation  Outcome: Progressing     Problem: Cardiovascular - Adult  Goal: Maintains optimal cardiac output and hemodynamic stability  Outcome: Progressing  Flowsheets (Taken 02/16/2021 0000)  Maintains optimal cardiac output and hemodynamic stability: Monitor blood pressure and heart rate     Problem: Metabolic/Fluid and Electrolytes - Adult  Goal: Electrolytes maintained within normal limits  Outcome: Progressing  Flowsheets  Taken 02/16/2021 0000  Electrolytes maintained within normal limits: Monitor labs and assess patient for signs and symptoms of electrolyte imbalances  Taken 02/15/2021 2000  Electrolytes maintained within normal limits: Monitor labs and assess patient for signs and symptoms of electrolyte imbalances      Problem: Skin/Tissue Integrity - Adult  Goal: Skin integrity remains intact  Outcome: Progressing  Flowsheets (Taken 02/15/2021 2000)  Skin Integrity Remains Intact: Monitor for areas of redness and/or skin breakdown     Problem: Musculoskeletal - Adult  Goal: Return mobility to safest level of function  Outcome: Progressing     Problem: Hematologic - Adult  Goal: Maintains hematologic stability  Outcome: Progressing  Flowsheets  Taken 02/16/2021 0000  Maintains hematologic stability: Assess for signs and symptoms of bleeding or hemorrhage  Taken 02/15/2021 2000  Maintains hematologic stability: Assess for signs and symptoms of bleeding or hemorrhage     Problem: ABCDS Injury Assessment  Goal: Absence of physical injury  Outcome: Progressing     Problem: Safety - Adult  Goal: Free from fall injury  Outcome: Progressing     Problem: Safety - Medical Restraint  Goal: Remains free of injury from restraints (Restraint for Interference with Medical Device)  Description: INTERVENTIONS:  1. Determine that other, less restrictive measures have been tried or would not be effective before applying the restraint  2. Evaluate the patient's condition at the time of restraint application  3. Inform patient/family regarding the reason for restraint  4. Q2H: Monitor safety, psychosocial status, comfort, nutrition and hydration  Outcome: Progressing     Problem: Pain  Goal: Verbalizes/displays adequate comfort level or baseline comfort level  Outcome: Progressing

## 2021-02-17 LAB — CBC WITH AUTO DIFFERENTIAL
Basophils %: 0.7 %
Basophils Absolute: 0.1 10*3/uL (ref 0.0–0.2)
Eosinophils %: 0.5 %
Eosinophils Absolute: 0.1 10*3/uL (ref 0.0–0.6)
Hematocrit: 47.5 % (ref 36.0–48.0)
Hemoglobin: 15.7 g/dL (ref 12.0–16.0)
Lymphocytes %: 12.2 %
Lymphocytes Absolute: 1.7 10*3/uL (ref 1.0–5.1)
MCH: 29.6 pg (ref 26.0–34.0)
MCHC: 33 g/dL (ref 31.0–36.0)
MCV: 89.6 fL (ref 80.0–100.0)
MPV: 7.8 fL (ref 5.0–10.5)
Monocytes %: 21.9 %
Monocytes Absolute: 3 10*3/uL — ABNORMAL HIGH (ref 0.0–1.3)
Neutrophils %: 64.7 %
Neutrophils Absolute: 8.9 10*3/uL — ABNORMAL HIGH (ref 1.7–7.7)
Platelets: 239 10*3/uL (ref 135–450)
RBC: 5.3 M/uL — ABNORMAL HIGH (ref 4.00–5.20)
RDW: 15.5 % — ABNORMAL HIGH (ref 12.4–15.4)
WBC: 13.8 10*3/uL — ABNORMAL HIGH (ref 4.0–11.0)

## 2021-02-17 LAB — BASIC METABOLIC PANEL
Anion Gap: 17 — ABNORMAL HIGH (ref 3–16)
Anion Gap: 17 — ABNORMAL HIGH (ref 3–16)
BUN: 26 mg/dL — ABNORMAL HIGH (ref 7–20)
BUN: 31 mg/dL — ABNORMAL HIGH (ref 7–20)
CO2: 29 mmol/L (ref 21–32)
CO2: 31 mmol/L (ref 21–32)
Calcium: 9.4 mg/dL (ref 8.3–10.6)
Calcium: 9.9 mg/dL (ref 8.3–10.6)
Chloride: 91 mmol/L — ABNORMAL LOW (ref 99–110)
Chloride: 89 mmol/L — ABNORMAL LOW (ref 99–110)
Creatinine: 1.2 mg/dL — ABNORMAL HIGH (ref 0.6–1.1)
Creatinine: 1.4 mg/dL — ABNORMAL HIGH (ref 0.6–1.1)
GFR African American: 56 — AB (ref >60)
GFR African American: 47 — AB (ref >60)
GFR Non-African American: 46 — AB (ref >60)
GFR Non-African American: 39 — AB (ref >60)
Glucose: 116 mg/dL — ABNORMAL HIGH (ref 70–99)
Glucose: 134 mg/dL — ABNORMAL HIGH (ref 70–99)
Potassium: 4.3 mmol/L (ref 3.5–5.1)
Potassium: 3.8 mmol/L (ref 3.5–5.1)
Sodium: 137 mmol/L (ref 136–145)
Sodium: 137 mmol/L (ref 136–145)

## 2021-02-17 LAB — MAGNESIUM: Magnesium: 1.9 mg/dL (ref 1.80–2.40)

## 2021-02-17 MED ORDER — DIGOXIN 125 MCG PO TABS
125 MCG | Freq: Every day | ORAL | Status: DC
Start: 2021-02-17 — End: 2021-02-24
  Administered 2021-02-17 – 2021-02-24 (×8): 125 ug via ORAL

## 2021-02-17 MED ORDER — POTASSIUM CHLORIDE 20 MEQ/50ML IV SOLN
20 MEQ/50ML | INTRAVENOUS | Status: AC
Start: 2021-02-17 — End: 2021-02-16
  Administered 2021-02-17 (×3): 20 meq via INTRAVENOUS

## 2021-02-17 MED ORDER — AMIODARONE HCL 200 MG PO TABS
200 MG | Freq: Every day | ORAL | Status: DC
Start: 2021-02-17 — End: 2021-02-24

## 2021-02-17 MED ORDER — AMIODARONE HCL 200 MG PO TABS
200 MG | Freq: Two times a day (BID) | ORAL | Status: DC
Start: 2021-02-17 — End: 2021-02-24
  Administered 2021-02-17 – 2021-02-24 (×15): 200 mg via ORAL

## 2021-02-17 MED ORDER — DIPHENHYDRAMINE HCL 50 MG/ML IJ SOLN
50 MG/ML | Freq: Three times a day (TID) | INTRAMUSCULAR | Status: DC | PRN
Start: 2021-02-17 — End: 2021-02-24

## 2021-02-17 MED ORDER — QUETIAPINE FUMARATE 25 MG PO TABS
25 MG | Freq: Every evening | ORAL | Status: DC
Start: 2021-02-17 — End: 2021-02-17

## 2021-02-17 MED ORDER — DIPHENHYDRAMINE HCL 50 MG/ML IJ SOLN
50 MG/ML | Freq: Every evening | INTRAMUSCULAR | Status: DC | PRN
Start: 2021-02-17 — End: 2021-02-24
  Administered 2021-02-18: 02:00:00 25 mg via INTRAVENOUS

## 2021-02-17 MED ORDER — QUETIAPINE FUMARATE 25 MG PO TABS
25 MG | Freq: Every evening | ORAL | Status: DC
Start: 2021-02-17 — End: 2021-02-18
  Administered 2021-02-18: 25 mg via ORAL

## 2021-02-17 MED ORDER — ZOLPIDEM TARTRATE 5 MG PO TABS
5 MG | Freq: Once | ORAL | Status: AC
Start: 2021-02-17 — End: 2021-02-16
  Administered 2021-02-17: 04:00:00 5 mg via ORAL

## 2021-02-17 MED FILL — FUROSEMIDE 10 MG/ML IJ SOLN: 10 mg/mL | INTRAMUSCULAR | Qty: 10

## 2021-02-17 MED FILL — ONDANSETRON HCL 4 MG/2ML IJ SOLN: 4 MG/2ML | INTRAMUSCULAR | Qty: 2

## 2021-02-17 MED FILL — METOPROLOL TARTRATE 25 MG PO TABS: 25 mg | ORAL | Qty: 1

## 2021-02-17 MED FILL — SPIRONOLACTONE 50 MG PO TABS: 50 mg | ORAL | Qty: 1

## 2021-02-17 MED FILL — DIGOXIN 125 MCG PO TABS: 125 ug | ORAL | Qty: 1

## 2021-02-17 MED FILL — SEROQUEL 25 MG PO TABS: 25 mg | ORAL | Qty: 1

## 2021-02-17 MED FILL — AMIODARONE HCL 450 MG/9ML IV SOLN: 450 MG/9ML | INTRAVENOUS | Qty: 9

## 2021-02-17 MED FILL — ZOLPIDEM TARTRATE 5 MG PO TABS: 5 mg | ORAL | Qty: 1

## 2021-02-17 MED FILL — MELATONIN 5 MG PO TBDP: 5 mg | ORAL | Qty: 1

## 2021-02-17 MED FILL — AMIODARONE HCL 200 MG PO TABS: 200 mg | ORAL | Qty: 1

## 2021-02-17 MED FILL — HYDROXYZINE HCL 10 MG PO TABS: 10 mg | ORAL | Qty: 1

## 2021-02-17 MED FILL — POTASSIUM CHLORIDE 20 MEQ/50ML IV SOLN: 20 MEQ/50ML | INTRAVENOUS | Qty: 50

## 2021-02-17 MED FILL — ELIQUIS 5 MG PO TABS: 5 mg | ORAL | Qty: 1

## 2021-02-17 MED FILL — DULOXETINE HCL 60 MG PO CPEP: 60 mg | ORAL | Qty: 1

## 2021-02-17 NOTE — Plan of Care (Signed)
Problem: Discharge Planning  Goal: Discharge to home or other facility with appropriate resources  02/17/2021 0630 by Idamae Schuller, RN  Outcome: Progressing  Flowsheets  Taken 02/17/2021 0400  Discharge to home or other facility with appropriate resources: Arrange for needed discharge resources and transportation as appropriate  Taken 02/16/2021 2000  Discharge to home or other facility with appropriate resources: Arrange for needed discharge resources and transportation as appropriate     Problem: Chronic Conditions and Co-morbidities  Goal: Patient's chronic conditions and co-morbidity symptoms are monitored and maintained or improved  02/17/2021 0630 by Idamae Schuller, RN  Outcome: Progressing  Flowsheets  Taken 02/17/2021 0400  Care Plan - Patient's Chronic Conditions and Co-Morbidity Symptoms are Monitored and Maintained or Improved: Monitor and assess patient's chronic conditions and comorbid symptoms for stability, deterioration, or improvement  Taken 02/16/2021 2000  Care Plan - Patient's Chronic Conditions and Co-Morbidity Symptoms are Monitored and Maintained or Improved: Monitor and assess patient's chronic conditions and comorbid symptoms for stability, deterioration, or improvement     Problem: Respiratory - Adult  Goal: Achieves optimal ventilation and oxygenation  02/17/2021 0630 by Idamae Schuller, RN  Outcome: Progressing     Problem: Cardiovascular - Adult  Goal: Maintains optimal cardiac output and hemodynamic stability  02/17/2021 0630 by Idamae Schuller, RN  Outcome: Progressing  Flowsheets (Taken 02/17/2021 0400)  Maintains optimal cardiac output and hemodynamic stability: Monitor blood pressure and heart rate     Problem: Metabolic/Fluid and Electrolytes - Adult  Goal: Electrolytes maintained within normal limits  02/17/2021 0630 by Idamae Schuller, RN  Outcome: Progressing  Flowsheets  Taken 02/17/2021 0400  Electrolytes maintained within normal limits: Monitor labs and assess patient for signs and  symptoms of electrolyte imbalances  Taken 02/16/2021 2000  Electrolytes maintained within normal limits: Monitor labs and assess patient for signs and symptoms of electrolyte imbalances     Problem: Skin/Tissue Integrity - Adult  Goal: Skin integrity remains intact  02/17/2021 0630 by Idamae Schuller, RN  Outcome: Progressing  Flowsheets (Taken 02/16/2021 2000)  Skin Integrity Remains Intact: Assess vascular access sites hourly     Problem: Musculoskeletal - Adult  Goal: Return mobility to safest level of function  02/17/2021 0630 by Idamae Schuller, RN  Outcome: Progressing     Problem: Hematologic - Adult  Goal: Maintains hematologic stability  02/17/2021 0630 by Idamae Schuller, RN  Outcome: Progressing  Flowsheets  Taken 02/17/2021 0400  Maintains hematologic stability: Assess for signs and symptoms of bleeding or hemorrhage  Taken 02/16/2021 2000  Maintains hematologic stability: Assess for signs and symptoms of bleeding or hemorrhage     Problem: ABCDS Injury Assessment  Goal: Absence of physical injury  02/17/2021 0630 by Idamae Schuller, RN  Outcome: Progressing     Problem: Safety - Adult  Goal: Free from fall injury  02/17/2021 0630 by Idamae Schuller, RN  Outcome: Progressing     Problem: Pain  Goal: Verbalizes/displays adequate comfort level or baseline comfort level  02/17/2021 0630 by Idamae Schuller, RN  Outcome: Progressing

## 2021-02-17 NOTE — Care Coordination-Inpatient (Signed)
Case Management Assessment           Daily Note                 Date/ Time of Note: 02/17/2021 4:56 PM         Note completed by: Milford Cage, RN    Patient Name: Molly [name]  Date of Birth: 1961-03-12    Diagnosis:Shortness of breath [R06.02]  CHF (congestive heart failure), NYHA class I, acute on chronic, combined (HCC) [I50.43]  Congestive heart failure, unspecified HF chronicity, unspecified heart failure type (HCC) [I50.9]  Patient Admission Status: Inpatient    Date of Admission:02/09/2021  6:11 PM Length of Stay: 8 GLOS: GMLOS: 4.3    Current Plan of Care:     Transfer  From  ICU  To  [facility] today :    PT/OT Eval Status: Ordered  ??  Dispo -pending diuresis, transition off of amnio/Lasix drip, cardiology recs, PT OT  ??  ________________________________________________________________________________________  PT AM-PAC:   / 24 per last evaluation on: pending    OT AM-PAC:   / 24 per last evaluation on: pending    DME Needs for discharge: TBD   ________________________________________________________________________________________  Discharge Plan: Home with Home Health Care: VS  SNF      Tentative discharge date: tbd    Current barriers to discharge: Cardiology clearance  transition  Off Amio gtt to PO ,     Referrals completed: Not Applicable    Resources/ information provided: Not indicated at this time  ________________________________________________________________________________________  Case Management Notes:  Transfer  Out ot  ICU  :  To [facility]      The original plan was to return home. Will re-assess following PT/OT evaluations and recommendations  ??  ??Patient is from home with spouse, completely independent pta. ??CM will continue to follow for discharge needs      Molly [name] and her family were provided with choice of provider; she and her family are in agreement with the discharge plan.    Care Transition Patient: No    Milford Cage, RN  [facility]  Case Management  Department  Ph: [phone]

## 2021-02-17 NOTE — Progress Notes (Signed)
Electrophysiology - PROGRESS NOTE    Admit Date: 02/09/2021     Chief Complaint: AF/AFL     Interval History:   Patient seen and examined and notes reviewed. Patient is being followed for AF/AFL.  Patient had presented with progressively worsening shortness of breath ongoing for the past 6 weeks.  She also noted a 40 pound weight gain.  She ended up seeing her PCP who ordered an echo that showed an EF of less than 20%.  She was then referred to the ED for further evaluation due to ongoing symptoms.  She was noted to be in atrial flutter with a poorly controlled ventricular rate.  She was started on aggressive IV diuresis.  She had gone to the EP lab on 02/12/2021 and was noted to have a drop in her saturation.  Anesthesia recommended elective intubation for the procedure.  Post procedure she was unable to be extubated and was transferred to ICU.  CXR showed increased bronchopulmonary markings prominent upper lobe veins.  She was restarted on her Lasix drip.  She was placed on a milrinone drip however her blood pressure remained low and this was discontinued over the weekend.  She was placed on pressors for her BP, now off.  She did develop A. fib and was placed on an amiodarone drip. Extubated 6/27. Somnolent - did not sleep overnight per family.  Was in AF yesterday and converted to NSR around 1215 this AM.  Off heparin drip and on Eliquis.  Remains on amiodarone drip.    In: 1209 [I.V.:1209]  Out: 3775    Wt Readings from Last 2 Encounters:   02/17/21 282 lb 10.1 oz (128.2 kg)   02/01/21 (!) 336 lb (152.4 kg)       Data:   Scheduled Meds:   Scheduled Meds:  ??? QUEtiapine  25 mg Oral Nightly   ??? digoxin  125 mcg Oral Daily   ??? melatonin  5 mg Oral Nightly   ??? sodium chloride flush  5-40 mL IntraVENous 2 times per day   ??? apixaban  5 mg Oral BID   ??? spironolactone  50 mg Oral Daily   ??? metoprolol tartrate  25 mg Oral BID   ??? DULoxetine  60 mg Oral Daily   ??? [Held  by provider] hydroxychloroquine  400 mg Oral Daily     Continuous Infusions:  ??? furosemide (LASIX) 1mg /ml infusion 20 mg/hr (02/17/21 0943)   ??? amiodarone 0.5 mg/min (02/17/21 0256)   ??? sodium chloride     ??? dextrose     ??? norepinephrine Stopped (02/15/21 0945)     PRN Meds:.diphenhydrAMINE, diphenhydrAMINE, ipratropium-albuterol, sodium chloride (Inhalant), heparin (porcine), heparin (porcine), phenol, sodium chloride flush, sodium chloride, acetaminophen, glucose, dextrose bolus **OR** dextrose bolus, glucagon (rDNA), dextrose, hydrOXYzine HCl, albuterol, ondansetron **OR** ondansetron, polyethylene glycol  Continuous Infusions:  ??? furosemide (LASIX) 1mg /ml infusion 20 mg/hr (02/17/21 0943)   ??? amiodarone 0.5 mg/min (02/17/21 0256)   ??? sodium chloride     ??? dextrose     ??? norepinephrine Stopped (02/15/21 0945)       Intake/Output Summary (Last 24 hours) at 02/17/2021 1246  Last data filed at 02/17/2021 0555  Gross per 24 hour   Intake 1209 ml   Output 2825 ml   Net -1616 ml       CBC:   Lab Results   Component Value Date    WBC 13.8 02/17/2021    HGB 15.7 02/17/2021    PLT 239 02/17/2021  BMP:  Lab Results   Component Value Date    NA 137 02/17/2021    K 4.3 02/17/2021    K 3.7 02/09/2021    CL 91 02/17/2021    CO2 29 02/17/2021    BUN 26 02/17/2021    CREATININE 1.2 02/17/2021    GLUCOSE 116 02/17/2021     INR:   Lab Results   Component Value Date    INR 1.23 02/15/2021    INR 1.27 02/10/2021    INR 1.04 10/25/2016        CARDIAC LABS  ENZYMES:No results for input(s): CKMB, CKMBINDEX, TROPONINI in the last 72 hours.    Invalid input(s): CKTOTAL;3  FASTING LIPID PANEL:  Lab Results   Component Value Date    HDL 33 02/10/2021    LDLCALC 72 02/10/2021    TRIG 82 02/10/2021     LIVER PROFILE:  Lab Results   Component Value Date    AST 86 02/12/2021    AST 61 01/29/2021    ALT 79 02/12/2021    ALT 78 01/29/2021       -----------------------------------------------------------------  Telemetry: Personally reviewed   AF/RVR - HR in the low 100's    Objective:   Vitals: BP 122/84    Pulse 96    Temp 97.3 ??F (36.3 ??C) (Temporal)    Resp 18    Ht 5\' 11"  (1.803 m)    Wt 282 lb 10.1 oz (128.2 kg)    SpO2 95%    BMI 39.42 kg/m??   General appearance: intubated and sedated   Eyes: Conjunctiva and pupils normal and reactive  Skin: Skin color, texture, turgor normal. No rashes or ecchymosis.  Neck: no JVD, supple, symmetrical, trachea midline   Lungs: , no accessory muscle use, no respiratory distress  Heart: RRR  Abdomen: soft, non-tender; bowel sounds normal  Extremities: No edema, DP +  Psychiatric: normal insight and affect    Patient Active Problem List:     History of non-Hodgkin's lymphoma     Impaired glucose tolerance     HLD (hyperlipidemia)     Vitamin D deficiency     Morbid obesity with BMI of 40.0-44.9, adult (HCC)     Systemic lupus erythematosus (HCC)     High risk medication use     Status post total left knee replacement     Seasonal allergies     Acute bacterial sinusitis     Shortness of breath     Acute on chronic congestive heart failure (HCC)     CHF (congestive heart failure), NYHA class I, acute on chronic, combined (HCC)     Atrial flutter with rapid ventricular response (HCC)     Dilated cardiomyopathy (HCC)     Acute respiratory failure with hypoxia (HCC)     Lactic acidosis        Assessment & Plan:      1. AF/AFL  2. On amio/OAC  3. CMP - EF <20%  4. HFrEF  5. Hypoxic resp failure    60 y/o woman with a h/o morbid obesity, NHL of the L groin (2006), s/p chemo (2006-2007), SLE, RA, who p/w ongoing SOB, cough, BLE edema for approx prior 30 days, was hypotensive, BNP 4700, found to have an EF<20% per echo, grade III DD, found to be in AFL with a HR of 127 bpm, CT neg for PE, poss elevated R sided pressures, developed hypoxia on 02/10/2021 that resolved with NRB, s/p RFA of AFL (02/12/2021),  became hypoxic post procedure and was unable to be intubated, developed AF, placed on an amio gtt, extubated 02/15/2021, off  pressors  CHA2DS2-VASc 2. TSH 1.6 (02/10/2021).     AF  - In NSR - converted around midnight  - S/p RFA of AFL w/ pAF noted  - On amio gtt - will change to amiodarone 200 mg twice daily x2 weeks then 200 mg daily  - Received dig x 1 (02/14/2021)  - On metoprolol 25 mg BID - change to long acting on d/c  - Keep K+ > 4.0 and Mg > 2.0  - Reviewed recent labs  - On Eliquis 5 mg BID  - Echo - EF < 20%, sev LV dilatation, grade III DD, RV systolic function sev reduced  - Sleep study outptient    CMP  - EF <20%  - NYHA class II/III  - QRS 104  - BB on hold - will restart when BP improves post extubation  - Cr improving - now 1.2  - Ischemic w/u when   - Dr. Harrison Mons following    Ferdinand Lango CNP  [facility]    I  have spent 35 minutes in care of the patient including direct face to face time, chart preparation, reviewing diagnostic testing, other provider notes and coordinating patient care.

## 2021-02-17 NOTE — Progress Notes (Signed)
ICU Progress Note    Admit Date: 02/09/2021    CC Vent support     Interval history:  -NAEO  -HDS, NSR this AM  -Remains on Amio gtt, Lasix gtt increased to 20 per cardiology  -Remains Afebrile   -Satting well on 1L NC  -Patient continues to have difficulty w/ insomnia    Medications:     Scheduled Meds:  ??? melatonin  5 mg Oral Nightly   ??? sodium chloride flush  5-40 mL IntraVENous 2 times per day   ??? apixaban  5 mg Oral BID   ??? spironolactone  50 mg Oral Daily   ??? metoprolol tartrate  25 mg Oral BID   ??? [Held by provider] DULoxetine  60 mg Oral Daily   ??? [Held by provider] hydroxychloroquine  400 mg Oral Daily     Continuous Infusions:  ??? furosemide (LASIX) 1mg /ml infusion 20 mg/hr (02/17/21 0423)   ??? amiodarone 0.5 mg/min (02/17/21 0256)   ??? sodium chloride     ??? dextrose     ??? norepinephrine Stopped (02/15/21 0945)     PRN Meds:ipratropium-albuterol, sodium chloride (Inhalant), heparin (porcine), heparin (porcine), phenol, sodium chloride flush, sodium chloride, acetaminophen, glucose, dextrose bolus **OR** dextrose bolus, glucagon (rDNA), dextrose, hydrOXYzine HCl, albuterol, ondansetron **OR** ondansetron, polyethylene glycol    Objective:   Vitals:   T-max:  Patient Vitals for the past 8 hrs:   BP Temp Temp src Pulse Resp SpO2 Height Weight   02/17/21 0700 (!) 132/90 -- -- 97 18 -- -- --   02/17/21 0653 -- -- -- -- -- -- 5\' 11"  (1.803 m) 282 lb 10.1 oz (128.2 kg)   02/17/21 0600 (!) 147/76 -- -- 97 25 95 % -- --   02/17/21 0500 133/88 -- -- 94 12 93 % -- --   02/17/21 0400 124/83 97.5 ??F (36.4 ??C) Temporal 91 21 96 % -- --   02/17/21 0300 121/80 -- -- 94 15 95 % -- --   02/17/21 0200 121/87 -- -- 92 17 96 % -- --   02/17/21 0100 138/89 -- -- (!) 104 19 98 % -- --   02/17/21 0003 117/76 98.3 ??F (36.8 ??C) Temporal (!) 105 20 98 % -- --       Intake/Output Summary (Last 24 hours) at 02/17/2021 0746  Last data filed at 02/17/2021 0555  Gross per 24 hour   Intake 1209 ml   Output 3775 ml   Net -2566 ml       Physical  Exam  Constitutional:       General: She is not in acute distress.     Appearance: Normal appearance. She is obese.   HENT:      Head: Normocephalic and atraumatic.      Mouth/Throat:      Mouth: Mucous membranes are moist.      Pharynx: Oropharynx is clear.   Eyes:      Conjunctiva/sclera: Conjunctivae normal.      Pupils: Pupils are equal, round, and reactive to light.   Cardiovascular:      Rate and Rhythm: Tachycardia present.      Pulses: Normal pulses.      Heart sounds: Normal heart sounds. No murmur heard.  No friction rub. No gallop.    Pulmonary:      Effort: Pulmonary effort is normal.      Breath sounds: Normal breath sounds. No wheezing, rhonchi or rales.   Abdominal:      General:  Bowel sounds are normal.      Palpations: Abdomen is soft.      Tenderness: There is no abdominal tenderness.   Musculoskeletal:         General: Normal range of motion.      Cervical back: Normal range of motion and neck supple.      Right lower leg: No edema.      Left lower leg: No edema.   Skin:     General: Skin is warm and dry.      Capillary Refill: Capillary refill takes less than 2 seconds.   Neurological:      General: No focal deficit present.      Mental Status: She is alert and oriented to person, place, and time.   Psychiatric:      Comments: Patient appears very anxious and verbalizing a fixated belief that she is going to die.            LABS:    CBC:   Recent Labs     02/15/21  1117 02/16/21  0404 02/17/21  0349   WBC 12.5* 13.0* 13.8*   HGB 13.7 14.2 15.7   HCT 41.9 43.4 47.5   PLT 220 210 239   MCV 89.9 90.1 89.6     Renal:    Recent Labs     02/14/21  1729 02/14/21  1729 02/14/21  1842 02/15/21  0749 02/15/21  1907 02/15/21  1956 02/16/21  0404 02/16/21  1917 02/17/21  0349   NA 143   < > 135*   < >  --    < > 135* 137 137   K 2.0*   < > 3.5   < >  --    < > 4.4 3.7 4.3   CL 116*   < > 94*   < >  --    < > 91* 90* 91*   CO2 17*   < > 26   < >  --    < > 29 32 29   BUN 30*   < > 47*   < >  --    < > 31* 26*  26*   CREATININE 0.9   < > 1.8*   < >  --    < > 1.3* 1.2* 1.2*   GLUCOSE 71   < > 120*   < >  --    < > 115* 130* 116*   CALCIUM 4.1*   < > 8.0*   < >  --    < > 8.5 9.2 9.4   MG  --   --   --   --  1.60*  --  2.50*  --  1.90   PHOS 2.4*  --  4.4  --   --   --   --   --   --    ANIONGAP 10   < > 15   < >  --    < > 15 15 17*    < > = values in this interval not displayed.     Hepatic:   Recent Labs     02/14/21  1729 02/14/21  1842   LABALBU 1.7* 3.5     Troponin:   No results for input(s): TROPONINI in the last 72 hours.  BNP: No results for input(s): BNP in the last 72 hours.  Lipids: No results for input(s): CHOL, HDL in the last 72 hours.    Invalid input(s):  LDLCALCU, TRIGLYCERIDE  ABGs:    Recent Labs     02/14/21  1129 02/15/21  1119   PHART 7.441 7.478*   PCO2ART 41.7 44.9   PO2ART 102.0 124.0*   HCO3ART 28 33*   BEART 3.8* 8.2*   O2SATART 99 99   TCO2ART 30 35       INR:   Recent Labs     02/15/21  1117   INR 1.23*     Lactate:   No results for input(s): LACTATE in the last 72 hours.  Cultures:  -----------------------------------------------------------------  RAD:   FL MODIFIED BARIUM SWALLOW W VIDEO   Final Result      Aspiration with thin liquids      Please see speech pathology assessment for dietary recommendations.      XR CHEST PORTABLE   Final Result      Mild left basilar airspace disease.      XR CHEST PORTABLE   Final Result      1. Endotracheal tube in stable position. Right IJ central venous catheter placed with the tip in the distal SVC in satisfactory position   2. Stable cardiac enlargement with no acute segmental consolidation               XR CHEST PORTABLE   Final Result   Impression:       Tip of endotracheal tube is 5 cm the carina.      Lungs are clear. No pneumothorax.      CT CHEST PULMONARY EMBOLISM W CONTRAST   Final Result      1. No evidence of any pulmonary embolism.   2. Four-chamber cardiac enlargement with reflux of contrast into the hepatic veins. This can be seen with  elevated right heart pressures.   3. No acute pulmonary process.      XR CHEST PORTABLE   Final Result      Stable cardiac enlargement      No acute consolidation                  Assessment/Plan:   60 yo female with a PMH of NHL, SLE who presented to the ED with chief complaints of cough and shortness of breath for the past 6 weeks that got worse recently in the past few days  ??  Acute Exacerbation of New-Onset HFrEF   Cardiogenic shock  AKI  Recent ECHO on 6/17 with severe dilation of LV, EF of <20%, severe diffuse hypokinesis and grade 3 diastolic dysfunction.  -Lasix gtt @ 20  -Lopressor 25 mg PO BID  -Aldactone 50 mg PO QD  -requires ischemic w/u, consider when euvolemic??  -Cards c/s - appreciate recs  -s/p AFL ablation  -UOP 3.8L, Cr stable    New-Onset Atrial Flutter s/p ablation   Patient endorses SOB however no chest pain or palpitations. She states she feels anxious. Tachycardic on exam.   Arrived to ED in Atrial Flutter based on EKG obtained at admission. Given Metoprolol 2.5 mg once on admission. ??  -ablation w/ EP 6/24  -Eliquis & Lopressor 25 mg PO BID per above  -Cards??and EP??c/s - reccs appreciated  ??  Anxiety  Ongoing Insomnia   Patient denies any history of. Appears very anxious this admission. Consider related to Atrial flutter/ablation and its ensuing cardiopulmonary complications  -Home Cymbalta 60 mg Held as it cannot be crushed     Acute Respiratory Failure 2/2 acute HFrEF s/p a flutter ablation (Resolved)  ??  Chronic  Medical Conditions:  SLE - holding Plaquenil 400 mg PO daily (cannot be crushed)  OA - holding Relafen    Code Status: FULL  FEN:   PPX: Hep gtt  DISPO: ICU, pending transfer     This patient has been staffed and discussed with Dr. Jonah Blue, MD, PGY-1  02/17/21  7:46 AM   Patient examined, findings as discussed with Dr. Dimas Aguas.  Agree with assessment and plan.  Cardiac rhythm remains sinus.  Diuresis improving heart failure, with improving oxygenation  status.  Transfer from ICU, given her decreased level of monitoring requirements, and need for a quieter environment to allow better sleep hygiene

## 2021-02-17 NOTE — Progress Notes (Signed)
Hospitalist Progress Note      PCP: Lindell Spar, APRN - CNP    Date of Admission: 02/09/2021    Chief Complaint: Shortness of breath     Hospital Course: see H&P     Subjective: Patient appears better than yesterday but still having difficulty with insomnia.  Denies any complaints.  Following commands.     Medications:  Reviewed    Infusion Medications   ??? furosemide (LASIX) 1mg /ml infusion 20 mg/hr (02/17/21 0423)   ??? amiodarone 0.5 mg/min (02/17/21 0256)   ??? sodium chloride     ??? dextrose     ??? norepinephrine Stopped (02/15/21 0945)     Scheduled Medications   ??? melatonin  5 mg Oral Nightly   ??? sodium chloride flush  5-40 mL IntraVENous 2 times per day   ??? apixaban  5 mg Oral BID   ??? spironolactone  50 mg Oral Daily   ??? metoprolol tartrate  25 mg Oral BID   ??? [Held by provider] DULoxetine  60 mg Oral Daily   ??? [Held by provider] hydroxychloroquine  400 mg Oral Daily     PRN Meds: ipratropium-albuterol, sodium chloride (Inhalant), heparin (porcine), heparin (porcine), phenol, sodium chloride flush, sodium chloride, acetaminophen, glucose, dextrose bolus **OR** dextrose bolus, glucagon (rDNA), dextrose, hydrOXYzine HCl, albuterol, ondansetron **OR** ondansetron, polyethylene glycol      Intake/Output Summary (Last 24 hours) at 02/17/2021 0752  Last data filed at 02/17/2021 0555  Gross per 24 hour   Intake 1209 ml   Output 3775 ml   Net -2566 ml       Physical Exam Performed:    BP (!) 132/90    Pulse 97    Temp 97.5 ??F (36.4 ??C) (Temporal)    Resp 18    Ht 5\' 11"  (1.803 m)    Wt 282 lb 10.1 oz (128.2 kg)    SpO2 95%    BMI 39.42 kg/m??     General appearance: No apparent distress  HEENT: Pupils equal, round, and reactive to light. Conjunctivae/corneas clear.  Neck: Supple, with full range of motion. No jugular venous distention. Trachea midline.  Respiratory:  Normal respiratory effort. Clear to auscultation, bilaterally without Rales/Wheezes/Rhonchi.  Cardiovascular: Tachycardic with normal rhythm with  normal S1/S2 without murmurs, rubs or gallops.  Abdomen: Soft, non-tender, non-distended with normal bowel sounds.  Musculoskeletal: Bilateral lower extremity edema improving  Skin: Skin color, texture, turgor normal.  No rashes or lesions.  Neurologic:  Neurovascularly intact without any focal sensory/motor deficits. Cranial nerves: II-XII intact, grossly non-focal.  Psychiatric:sedated  Capillary Refill: Brisk,3 seconds, normal   Peripheral Pulses: +2 palpable, equal bilaterally       Labs:   Recent Labs     02/15/21  1117 02/16/21  0404 02/17/21  0349   WBC 12.5* 13.0* 13.8*   HGB 13.7 14.2 15.7   HCT 41.9 43.4 47.5   PLT 220 210 239     Recent Labs     02/14/21  1729 02/14/21  1729 02/14/21  1842 02/15/21  0749 02/16/21  0404 02/16/21  1917 02/17/21  0349   NA 143   < > 135*   < > 135* 137 137   K 2.0*   < > 3.5   < > 4.4 3.7 4.3   CL 116*   < > 94*   < > 91* 90* 91*   CO2 17*   < > 26   < > 29 32 29  BUN 30*   < > 47*   < > 31* 26* 26*   CREATININE 0.9   < > 1.8*   < > 1.3* 1.2* 1.2*   CALCIUM 4.1*   < > 8.0*   < > 8.5 9.2 9.4   PHOS 2.4*  --  4.4  --   --   --   --     < > = values in this interval not displayed.     No results for input(s): AST, ALT, BILIDIR, BILITOT, ALKPHOS in the last 72 hours.  Recent Labs     02/15/21  1117   INR 1.23*     No results for input(s): CKTOTAL, TROPONINI in the last 72 hours.    Urinalysis:      Lab Results   Component Value Date    NITRU Negative 02/09/2021    WBCUA 3-5 02/09/2021    BACTERIA RARE 04/19/2017    RBCUA 3-4 02/09/2021    BLOODU Negative 02/09/2021    SPECGRAV 1.015 02/09/2021    GLUCOSEU Negative 02/09/2021       Radiology:  FL MODIFIED BARIUM SWALLOW W VIDEO   Final Result      Aspiration with thin liquids      Please see speech pathology assessment for dietary recommendations.      XR CHEST PORTABLE   Final Result      Mild left basilar airspace disease.      XR CHEST PORTABLE   Final Result      1. Endotracheal tube in stable position. Right IJ central  venous catheter placed with the tip in the distal SVC in satisfactory position   2. Stable cardiac enlargement with no acute segmental consolidation               XR CHEST PORTABLE   Final Result   Impression:       Tip of endotracheal tube is 5 cm the carina.      Lungs are clear. No pneumothorax.      CT CHEST PULMONARY EMBOLISM W CONTRAST   Final Result      1. No evidence of any pulmonary embolism.   2. Four-chamber cardiac enlargement with reflux of contrast into the hepatic veins. This can be seen with elevated right heart pressures.   3. No acute pulmonary process.      XR CHEST PORTABLE   Final Result      Stable cardiac enlargement      No acute consolidation                Assessment/Plan:    Active Hospital Problems    Diagnosis    ??? Paroxysmal atrial fibrillation (HCC) [I48.0]      Priority: Medium   ??? Atrial flutter with rapid ventricular response (HCC) [I48.92]      Priority: Medium   ??? Dilated cardiomyopathy (HCC) [I42.0]      Priority: Medium   ??? Acute respiratory failure with hypoxia (HCC) [J96.01]      Priority: Medium   ??? Lactic acidosis [E87.2]      Priority: Medium   ??? CHF (congestive heart failure), NYHA class I, acute on chronic, combined (HCC) [I50.43]      Priority: Medium   ??? Shortness of breath [R06.02]      Priority: Medium     60 yo female with a PMH of NHL, SLE who presented to the ED with chief complaints of cough and shortness of breath for the  past 6 weeks that got worse recently in the past few days  ??  Acute Exacerbation of New-Onset HFrEF   Cardiogenic shock  Recent ECHO on 6/17 with severe dilation of LV, EF of <20%, severe diffuse hypokinesis and grade 3 diastolic dysfunction.  - Off Milrinone since 6/25. Off Levophed  - On Lasix gtt  - Requires ischemic w/u, consider when euvolemic??  - Cards following - appreciate recs    Acute hypoxic respiratory failure 2/2 acute HFrEF s/p a flutter ablation (6/24)  - S/p extubation 02/15/2021  - Off sedation  - Off oxygen now  -  Resolved  ??  New-Onset Atrial Flutter??s/p ablation??(6/24)??  -??S/p ablation w/ EP 6/24  - Status post heparin drip.  Transition to Eliquis twice daily 06/28  - Received a dose of digoxin 125 mcgX1 and lopressor resumed 06/28  - On Amiodarone gtt  - Cardiology following, appreciate recommendation    AKI  - Likely 2/2 hypoperfusion due to cardiogenic shock  - Avoid nephrotoxic medications  - Monitor creatinine, creatinine 1.2 today    Insomnia  - Patient has been unable to sleep even after doses of melatonin and Ambien  - Seroquel 25 mg nightly started    Anxiety  - Cymbalta 60 mg PO daily resumed    SLE   - Holding Plaquenil 400 mg PO daily    OA   - Holding Relafen    Morbid obesity BMI 40, complicates overall care    Non-Hodgkin's lymphoma status postchemo 2006-2007    DVT Prophylaxis: Eliquis  Diet: ADULT DIET; Dysphagia - Soft and Bite Sized; Mildly Thick (Nectar)  Code Status: Full Code    PT/OT Eval Status: Ordered    Dispo -pending diuresis, transition off of amnio/Lasix drip, cardiology recs, PT OT    Oneal Grout, MD

## 2021-02-17 NOTE — Progress Notes (Signed)
Cardiology Consult Service  Daily Progress Note        Admit Date:  02/09/2021  Primary cardiologist: Dr Harrison Mons / Dr. Clarise Cruz    Reason for Consultation/Chief Complaint: AHF, PAFRVR    Subjective:     Molly [name] is a 60 y.o. female with a past medical history of morbid obesity (BMI 43), non-Hodgkin's lymphoma status postchemotherapy 2006- 2007, SLE, RA.  Patient denies any previous cardiac history.  Patient reports she had MUGA test prior to chemotherapy and during treatment, last MUGA test was 2 years after her last chemotherapy and cardiac function was reportedly normal.  ??  Patient presented to the emergency room on 6/21 with progressively worse shortness of breath, lower extremity edema, weight gain, fatigue over the last 6 weeks.  She admits to occasional fluttering in her chest which is very mild.  At the emergency room she was afebrile, tachycardic in AF RVR/atrial flutter RVR, hypertensive, did not require any supplemental oxygen.  Electrolytes normal, proBNP 4700, troponin x1 negative, CBC normal, TSH normal, chest x-ray unremarkable, CTA chest negative for PE, significant biventricular dilatation consistent with cardiomyopathy.  ??  ECG 02/09/2021: Atrial flutter/coarse A. fib with RVR 138 bpm.  ??  Patient was admitted for acute heart failure and was placed on Lasix drip 10 mg an hour after receiving Lasix 60 mg IV x1.  Today I's and O's -3 L, weight down 2 pounds, creatinine stable 1.9, K3.4, magnesium 1.9.  Telemetry was personally reviewed, reveals A. fib with RVR 110-120 bpm.  ??  Echo 02/05/2021: LVD 6.8 cm, LVEF less than 20%, diastolic grade 3, increased LVEDP, severe RV dilatation and dysfunction, moderate MR/TR, RVSP 40 (15).  Images were personally reviewed.  ??  S/p atrial flutter ablation 6/24.  Patient could not be extubated after the procedure due to hypoxia and therefore was transferred intubated to the ICU.    Home diuretics: Lasix 40 mg daily, spironolactone 25 mg daily and  Entresto    Interval history:  He was extubated on 6/27.  There were no overnight events.  Creatinine stable at 1.2, she is now off supplemental oxygen.  She is currently on dysphagia diet, I's and O's -2.1 L, weight down 5 pounds.    Objective:     Medications:  ??? QUEtiapine  25 mg Oral Nightly   ??? digoxin  125 mcg Oral Daily   ??? amiodarone  200 mg Oral BID   ??? [START ON 03/03/2021] amiodarone  200 mg Oral Daily   ??? melatonin  5 mg Oral Nightly   ??? sodium chloride flush  5-40 mL IntraVENous 2 times per day   ??? apixaban  5 mg Oral BID   ??? spironolactone  50 mg Oral Daily   ??? metoprolol tartrate  25 mg Oral BID   ??? DULoxetine  60 mg Oral Daily   ??? [Held by provider] hydroxychloroquine  400 mg Oral Daily       IV drips:  ??? furosemide (LASIX) 1mg /ml infusion 20 mg/hr (02/17/21 0943)   ??? sodium chloride     ??? dextrose     ??? norepinephrine Stopped (02/15/21 0945)       PRN:  diphenhydrAMINE, diphenhydrAMINE, ipratropium-albuterol, sodium chloride (Inhalant), heparin (porcine), heparin (porcine), phenol, sodium chloride flush, sodium chloride, acetaminophen, glucose, dextrose bolus **OR** dextrose bolus, glucagon (rDNA), dextrose, hydrOXYzine HCl, albuterol, ondansetron **OR** ondansetron, polyethylene glycol    Vitals:    02/17/21 1000 02/17/21 1100 02/17/21 1200 02/17/21 1529   BP: 02/19/21)  126/101  122/84 101/74   Pulse: 95 92 96 95   Resp: 21 18 18 15    Temp:    98 ??F (36.7 ??C)   TempSrc:    Oral   SpO2:  95%  90%   Weight:       Height:           Intake/Output Summary (Last 24 hours) at 02/17/2021 1703  Last data filed at 02/17/2021 0555  Gross per 24 hour   Intake 1209 ml   Output 2825 ml   Net -1616 ml     I/O last 3 completed shifts:  In: 2909 [I.V.:2909]  Out: 7625 [Urine:7625]  Wt Readings from Last 3 Encounters:   02/17/21 282 lb 10.1 oz (128.2 kg)   02/01/21 (!) 336 lb (152.4 kg)   01/29/21 (!) 331 lb (150.1 kg)       Admit Wt: Weight: (!) 315 lb (142.9 kg)   Todays Wt: Weight: 282 lb 10.1 oz (128.2 kg)    TELEMETRY  personally reviewed:  AF changed to NSR at 12 midnight.     Physical Exam:         General Appearance:  Sedated, intubated.    Head:  Normocephalic, without obvious abnormality, atraumatic   Eyes:  PERRL, conjunctiva/corneas clear EOM intact  Ears normal   Throat no lesions       Nose: Nares normal, no drainage or sinus tenderness   Throat: Lips, mucosa, and tongue normal   Neck: Supple, symmetrical, trachea midline, no adenopathy, thyroid: not enlarged, symmetric, no tenderness/mass/nodules, no carotid bruit.        Lungs:   Normal respiratory rate, lungs with ronchi bilaterally.    Chest Wall:  No tenderness or deformity   Heart:  Regular rhythm, rate is controlled, S1, S2 normal, there is no murmur, there is no rub or gallop, cannot assess jvd, 1+ bilateral lower extremity edema   Abdomen:   Soft, non-tender, bowel sounds active all four quadrants,  no masses, no organomegaly       Extremities: Extremities normal, atraumatic, no cyanosis.    Pulses: 2+ and symmetric   Skin: Skin color, texture, turgor normal, no rashes or lesions   Pysch: Normal mood and affect   Neurologic: Normal gross motor and sensory exam.  Cranial nerves intact       Labs:   Recent Labs     02/15/21  1907 02/15/21  1956 02/16/21  0404 02/16/21  1917 02/17/21  0349   NA  --    < > 135* 137 137   K  --    < > 4.4 3.7 4.3   BUN  --    < > 31* 26* 26*   CREATININE  --    < > 1.3* 1.2* 1.2*   CL  --    < > 91* 90* 91*   CO2  --    < > 29 32 29   GLUCOSE  --    < > 115* 130* 116*   CALCIUM  --    < > 8.5 9.2 9.4   MG 1.60*  --  2.50*  --  1.90    < > = values in this interval not displayed.     Recent Labs     02/15/21  1117 02/15/21  1117 02/16/21  0404 02/16/21  0404 02/17/21  0349   WBC 12.5*  --  13.0*  --  13.8*   HGB 13.7  --  14.2  --  15.7   HCT 41.9  --  43.4  --  47.5   PLT 220  --  210  --  239   MCV 89.9   < > 90.1   < > 89.6    < > = values in this interval not displayed.     No results for input(s): CHOLTOT, TRIG, HDL, CHOLHDL, LDL  in the last 72 hours.    Invalid input(s): LIPIDCOMM, VLDCHOL  Recent Labs     02/15/21  1117   INR 1.23*     No results for input(s): CKTOTAL, CKMB, CKMBINDEX, TROPONINI in the last 72 hours.  No results for input(s): BNP in the last 72 hours.  No results for input(s): NTPROBNP in the last 72 hours.  No results for input(s): TSH in the last 72 hours.    Imaging:       Assessment & Plan:     1. ??Acute and new HFrEF. ??It is of unknown etiology, could be ischemic (severe CAD) versus nonischemic (PAF RVR, less likely??postchemotherapy, viral). ??Patient is volume overloaded and hypotensive requiring pressor support.   2. ??Non-Hodgkin's lymphoma status post chemotherapy, now in remission  3. ??SLE  4. ??Rheumatoid arthritis  5. ??PAFL RVR. ??Patient appears to be in atrial flutter/coarse atrial fibrillation with RVR. ??It is of unknown duration and she has not been on any anticoagulation. ??She was minimally symptomatic (minimal palpitations). Patient was started on anticoagulation and EP consulted; patient had AFL ablation on 6/24, remained in sinus post procedure.  6.  SVT 6/26, now in sinus after dig bolus on amio drip.   ??  ??  ??  -  Cw Lasix 20 mg/h, spiro 50 daily; check a BMP at 6 pm; if creatinine worse, hold lasix gtt  - Amio gtt changed to amio 200 bid x 14 days then daily per EP.   - Cw lopressor 25 bid  - C/w dig 125 mcg daily  - Strict I's and O's every shift and standing weights if possible, low-salt diet, daily BMP with reflex to Mg (correct lytes for goals K >4.0 and Mg > 2.0) and wean supplemental oxygen to off (or down to baseline supplemental oxygen requirements) for sats greater than 92-94%.  - Cw eliquis 5 bid.   - EP following.               Due to the high probability of clinically significant life threating deterioration of the patient's condition that required my urgent intervention, a total critical care time 40 minutes was used. This time excludes any time that may have been spent performing  procedures. This includes but not limited to vital sign monitoring, telemetry monitoring, continuous pulse oximety, IV medication, clinical response to the IV medications, documentation time, consultation time, interpretation of lab data, review of nursing notes and old record review.       I have personally reviewed the reports and images of labs, radiological studies, cardiac studies including ECG's and telemetry, current and old medical records. The note was completed using EMR and Dragon dictation system. Every effort was made to ensure accuracy; however, inadvertent computerized transcription errors may be present.    All questions and concerns were addressed to the patient/family. Alternatives to my treatment were discussed.     Thank you for allowing to [place] to participate in the care or [facility] [place]. Please call our service with questions.    Laurette Schimke, MD, [facility], F[facility]SA  [facility]  [place]  [address]  [place]  [place] [address]  Ph: [phone]  Fax: [phone]

## 2021-02-17 NOTE — Progress Notes (Signed)
Comprehensive Nutrition Assessment    RECOMMENDATIONS:  1. PO Diet: Continue dysphagia soft & bite sized diet w/ mildly thick/nectar liquids per SLP  2. ONS: Start Magic Cup BID, Boost Pudding QD    NUTRITION ASSESSMENT:   Nutritional summary & status: Pt self-extubated on 6/27. Pt underwent an MBS yesterday & diet was advanced to a dysphagia soft & bite sized diet with nectar thickened liquids per SLP. No PO intakes have been documented yet. Pt working w/ SLP this AM. Noland Fordyce has been trending down throughout adm noting pt is on IV lasix. Will send ONS to help promote better PO intakes. Will continue to monitor nutritional status throughout adm   Admission/PMH: Admit for SOB, acute exacerbation of new onset HFrEF; PMH of non- hodgkin's lymphoma, Systemic lupus, rheumatoid arthritis       MALNUTRITION ASSESSMENT  Context of Malnutrition: Acute Illness   Malnutrition Status: At risk for malnutrition (Comment)  Findings of the 6 clinical characteristics of malnutrition (Minimum of 2 out of 6 clinical characteristics is required to make the diagnosis of moderate or severe Protein Calorie Malnutrition based on AND/ASPEN Guidelines):  Energy Intake: Less than/equal to 75% of estimated energy requirements    Energy Intake Time: Greater than or equal to 5 days      NUTRITION DIAGNOSIS   Inadequate oral intake related to swallowing difficulty as evidenced by swallow study results,intake 0-25%    NUTRITION INTERVENTION  Food and/or Nutrient Delivery:  Continue Current Diet,Start Oral Nutrition Supplement  Nutrition Education/Counseling:  No recommendation at this time   Goals:  pt will tolerate the most appropriate form of nutrition to meet >75% energy needs         Nutrition Monitoring and Evaluation:   Food/Nutrient Intake Outcomes:  Diet Advancement/Tolerance,Supplement Intake,Food and Nutrient Intake  Physical Signs/Symptoms Outcomes:  Biochemical Data,Weight,GI Status     OBJECTIVE DATA: Significant to nutrition  assessment  ?? Nutrition-Focused Physical Findings: lbm 6/24  ?? Labs: Reviewed; BG 116  ?? Meds: Reviewed; lasix  ?? Wounds:  (femoral puncture)       CURRENT NUTRITION THERAPIES  ADULT DIET; Dysphagia - Soft and Bite Sized; Mildly Thick (Nectar)     PO Intake: Unable to assess   PO Supplement Intake:None Ordered  Additional Sources of Calories/IVF:n/a     ANTHROPOMETRICS  Current Height: 5\' 11"  (180.3 cm)  Current Weight: 282 lb 10.1 oz (128.2 kg)    Admission weight: (!) 315 lb (142.9 kg)  Ideal Body Weight (IBW): 155 lbs  (70 kg)    Usual Bodyweight   UTA  Weight Changes wt fluctuates noting diuretics & CHF      BMI: 39.5    Wt Readings from Last 50 Encounters:   02/15/21 (!) 308 lb 10.3 oz (140 kg)   02/01/21 (!) 336 lb (152.4 kg)   01/29/21 (!) 331 lb (150.1 kg)   12/30/20 (!) 320 lb (145.2 kg)     COMPARATIVE STANDARDS  Energy (kcal):  1540-1960     Protein (g):  84-105       Fluid (ml/day):  1540-1960    The patient will still be monitored per nutrition standards of care.  Consult dietitian if nutrition interventions essential to patient care is needed.     03/01/21, MS, RD, LD  [place]:  [phone]  Office:  [phone]

## 2021-02-17 NOTE — Plan of Care (Signed)
Problem: Discharge Planning  Goal: Discharge to home or other facility with appropriate resources  02/17/2021 1849 by Mickey Farber, RN  Outcome: Progressing     Problem: Chronic Conditions and Co-morbidities  Goal: Patient's chronic conditions and co-morbidity symptoms are monitored and maintained or improved  02/17/2021 1849 by Mickey Farber, RN  Outcome: Progressing     Problem: Respiratory - Adult  Goal: Achieves optimal ventilation and oxygenation  02/17/2021 1849 by Mickey Farber, RN  Outcome: Progressing

## 2021-02-17 NOTE — Progress Notes (Signed)
Speech Language Pathology  Facility/Department: [facility]  Dysphagia Daily Treatment Note    NAME: Molly [name]  DOB: 1961-05-29  MRN: [id number]    Patient Diagnosis(es):   Patient Active Problem List    Diagnosis Date Noted   ??? Systemic lupus erythematosus ([place]) 09/22/2014   ??? Paroxysmal atrial fibrillation (HCC)    ??? Atrial flutter with rapid ventricular response ([place])    ??? Dilated cardiomyopathy ([place])    ??? Acute respiratory failure with hypoxia ([place])    ??? Lactic acidosis    ??? CHF (congestive heart failure), NYHA class I, acute on chronic, combined ([place]) 02/09/2021   ??? Acute on chronic congestive heart failure ([employer]) 02/01/2021   ??? Shortness of breath 01/29/2021   ??? Acute bacterial sinusitis 12/30/2020   ??? Seasonal allergies 05/08/2019   ??? 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 High risk medication use 01/25/2016   ??? Morbid obesity with BMI of 40.0-44.9, adult ([place]) 02/07/2014   ??? Vitamin D deficiency 09/19/2013   ??? Impaired glucose tolerance 02/06/2013   ??? HLD (hyperlipidemia) 02/06/2013   ??? History of non-Hodgkin's lymphoma      Allergies:   Allergies   Allergen Reactions   ??? Sulfa Antibiotics Rash     Recent Chest Xray 02/15/21  Mild left basilar airspace disease.    Previous MBS (02/16/21)    Chart reviewed.    Medical Diagnosis: Shortness of breath [R06.02]  CHF (congestive heart failure), NYHA class I, acute on chronic, combined ([place]) [I50.43]  Congestive heart failure, unspecified HF chronicity, unspecified heart failure type ([place]) [I50.9]   Treatment Diagnosis: Dysphagia    BSE Impression (02/16/21)-  Pt intubated 6/24 and self extubated 6/27. Pt alert and oriented but with weak, mostly aphonic voice. pt was assessed with trials with ice chips, water by cup and straw and applesauce. ROM of oral structures was [name].  Pt had no difficulty closing lips around spoon or drawing liquid up a straw.  Pt had no difficulty with mastication with ice chips.  There was no anterior spillage or oral  residue noted with any consistency. Pt had difficulty with initiating a dry swallow but had no difficulty with PO trials. Laryngeal movement noted with all trials. Pt noted to produce a strong reactive cough following first trial of water when consumed successive swallows of water by cup.  Provided pt with additional trials of water by cup and straw through out the session, but no other incidences of coughing or throat clearing noted, even when consumed 3 oz of water continuously. No s/s aspiration with 3 trials of applesauce. Given impaired vocal quality and recent extubation, recommend instrumental assessment prior to PO diet.      Dysphagia Diagnosis: Suspected needs further assessment    MBS results (02/16/21)-  Pt presents with mild oral/pharyngeal phase dysphagia  Oral-pt had no difficulty with A-P transport of the bolus with any consistency.  pt functional mastication with solids but there is a concern for fatigue given that pt is deconditioned. Pt with intermittent premature spillage over the base of the tongue with thin by cup  Pharyngeal- suspect impaired subglottic pressure given weak vocal quality along with impaired tongue base retraction and reduced hyolaryngeal excursion which resulted in aspiration of thin by cup with a cough reflex with first trial.  Cough was strong but not adequate enough to clear the material from the airway. With subsequent trials with thin by cup and straw, pt with deep penetration to the  cords with spontaneous clearing. Attempted bolus hold and chin tuck strategies but this did not aid in airway protection. With trials of nectar by cup and straw, shallow/flash penetration with 100% spontaneous clearing was noted. With trials with pudding and cracker, there was minimal residue remaining in the valleculae after the swallow which was cleared with a liquid wash. Although there was only one instance of aspiration with thin liquids, there were multiple instances of deep penetration  placing pt at risk for aspiration. Given pt's aphonia and recent extubation, recommend dysphagia III/soft and bite sized diet with nectar/mildly thick liquids at this time. Will continue to assess for diet upgrade in the days to come.     Pain: None expressed    Current Diet : ADULT DIET; Dysphagia - Soft and Bite Sized; Mildly Thick (Nectar)   Recommended Form of Meds:  (TBD following MBS)  Compensatory Swallowing Strategies : Eat/Feed slowly,Upright as possible for all oral intake,Alternate solids and liquids,Effortful swallow,Small bites/sips     Treatment:  Pt seen bedside to address the following goals:  Treatment/Goals  1-The patient will tolerate instrumental swallowing procedure  6/28- goal met  ??  2- The pt/caregiver will demonstrate understanding of swallowing recommendations and concerns.  6/28-??????The pt and family member were??educated to purpose of the visit, anatomy and physiology of the swallow,??impact intubation can have on swallowing function, role vocal cords play in airway protectionconcerns for aspiration,??recommendation for modified barium swallow and a description of the procedure. Both??stated comprehension and pt was in agreement with MBS.??con't goal  6/28- second session- the pt and RN were educated to the results of the MBS via review of the video from the study as well as diet recommendations, rational for nectar thick liquids, swallowing strategies and plan to continue to assess for possible diet advancement. Both stated comprehension. con't goal   6/29: Re-educated on results of MBS and diet level recommendations d/t risks associated with penetration/aspiration. Pt instructed on swallow strategies to utilize during all PO intake. Pt with adequate carry over, yet required cues for correct use. Cont.  ??  3-The patient will tolerate least restrictive diet without s/s aspiration or respiratory decline.   6/29: Pt seen this AM to trial current diet level. Pt assisted with oral care via  toothbrush and toothpaste prior to trials. Pt provided with cup sips nectar thick liquids and soft and bite sized solids. Pt readily self fed all trials. Pt instructed on taking small bites/sips and alternating solids/liquids. Pt with adequate carry over during trials given min cues. x1 thick, heavy sounding cough following completion of swallow of multiple cup sips nectar. No additional s/s penetration/aspiration. Pt provided with x10 ice chips to target effortful swallows. Pt completed with adequate effort x10. Recommend continue current diet level with use of strategies. Cont.     Patient/Family/Caregiver Education:  Discussed results from [place] completed yesterday (pt did not remember), reasons for current diet level, risks associated with aspiration, swallow treatment.    Compensatory Strategies:  Compensatory Swallowing Strategies : Eat/Feed slowly,Upright as possible for all oral intake,Alternate solids and liquids,Effortful swallow,Small bites/sips      Plan:  Continue dysphagia treatment with goals per plan of care.  Diet recommendations: Soft and Bite Sized Solids, Nectar Thick Liquids  -Meds with puree or Nectar thick liquids  -Feed A as needed  -Eat/Feed slowly  -Upright as possible for all oral intake  -Alternate solids and liquids  -Effortful swallow  -Small bites/sips  DC recommendation: TBD  Treatment: 15  D/W  nursing: Caryl Pina  Needs met prior to leaving room, call button in reach.    Electronically signed by:  Artelia Laroche, M.A., [place]  Speech-Language Pathologist  Pg. # [phone]    If patient is discharged prior to next treatment, this note will serve as the discharge summary

## 2021-02-18 ENCOUNTER — Inpatient Hospital Stay: Admit: 2021-02-18 | Payer: MEDICAID | Primary: Gerontology

## 2021-02-18 ENCOUNTER — Encounter: Attending: Nurse Practitioner | Primary: Gerontology

## 2021-02-18 LAB — CBC WITH AUTO DIFFERENTIAL
Basophils %: 0.6 %
Basophils Absolute: 0.1 10*3/uL (ref 0.0–0.2)
Eosinophils %: 0.7 %
Eosinophils Absolute: 0.1 10*3/uL (ref 0.0–0.6)
Hematocrit: 50.1 % — ABNORMAL HIGH (ref 36.0–48.0)
Hemoglobin: 16.4 g/dL — ABNORMAL HIGH (ref 12.0–16.0)
Lymphocytes %: 16.2 %
Lymphocytes Absolute: 2 10*3/uL (ref 1.0–5.1)
MCH: 29.1 pg (ref 26.0–34.0)
MCHC: 32.6 g/dL (ref 31.0–36.0)
MCV: 89.1 fL (ref 80.0–100.0)
MPV: 8.2 fL (ref 5.0–10.5)
Monocytes %: 17.3 %
Monocytes Absolute: 2.2 10*3/uL — ABNORMAL HIGH (ref 0.0–1.3)
Neutrophils %: 65.2 %
Neutrophils Absolute: 8.3 10*3/uL — ABNORMAL HIGH (ref 1.7–7.7)
Platelets: 288 10*3/uL (ref 135–450)
RBC: 5.63 M/uL — ABNORMAL HIGH (ref 4.00–5.20)
RDW: 15.6 % — ABNORMAL HIGH (ref 12.4–15.4)
WBC: 12.6 10*3/uL — ABNORMAL HIGH (ref 4.0–11.0)

## 2021-02-18 LAB — BASIC METABOLIC PANEL
Anion Gap: 18 — ABNORMAL HIGH (ref 3–16)
Anion Gap: 19 — ABNORMAL HIGH (ref 3–16)
BUN: 36 mg/dL — ABNORMAL HIGH (ref 7–20)
BUN: 42 mg/dL — ABNORMAL HIGH (ref 7–20)
CO2: 30 mmol/L (ref 21–32)
CO2: 29 mmol/L (ref 21–32)
Calcium: 9.7 mg/dL (ref 8.3–10.6)
Calcium: 9.9 mg/dL (ref 8.3–10.6)
Chloride: 89 mmol/L — ABNORMAL LOW (ref 99–110)
Chloride: 88 mmol/L — ABNORMAL LOW (ref 99–110)
Creatinine: 1.4 mg/dL — ABNORMAL HIGH (ref 0.6–1.1)
Creatinine: 1.6 mg/dL — ABNORMAL HIGH (ref 0.6–1.1)
GFR African American: 47 — AB (ref >60)
GFR African American: 40 — AB (ref >60)
GFR Non-African American: 39 — AB (ref >60)
GFR Non-African American: 33 — AB (ref >60)
Glucose: 119 mg/dL — ABNORMAL HIGH (ref 70–99)
Glucose: 128 mg/dL — ABNORMAL HIGH (ref 70–99)
Potassium: 4 mmol/L (ref 3.5–5.1)
Potassium: 3.8 mmol/L (ref 3.5–5.1)
Sodium: 137 mmol/L (ref 136–145)
Sodium: 136 mmol/L (ref 136–145)

## 2021-02-18 LAB — MAGNESIUM: Magnesium: 2 mg/dL (ref 1.80–2.40)

## 2021-02-18 MED ORDER — FUROSEMIDE 40 MG PO TABS
40 MG | Freq: Two times a day (BID) | ORAL | Status: DC
Start: 2021-02-18 — End: 2021-02-24
  Administered 2021-02-18: 21:00:00 40 mg via ORAL

## 2021-02-18 MED ORDER — METOPROLOL SUCCINATE ER 50 MG PO TB24
50 MG | Freq: Every day | ORAL | Status: DC
Start: 2021-02-18 — End: 2021-02-20
  Administered 2021-02-19: 13:00:00 50 mg via ORAL

## 2021-02-18 MED ORDER — LORAZEPAM 2 MG/ML IJ SOLN
2 MG/ML | Freq: Once | INTRAMUSCULAR | Status: AC | PRN
Start: 2021-02-18 — End: 2021-02-17

## 2021-02-18 MED ORDER — SPIRONOLACTONE 25 MG PO TABS
25 MG | Freq: Every day | ORAL | Status: DC
Start: 2021-02-18 — End: 2021-02-24
  Administered 2021-02-19: 13:00:00 25 mg via ORAL

## 2021-02-18 MED ORDER — SACUBITRIL-VALSARTAN 24-26 MG PO TABS
24-26 MG | Freq: Two times a day (BID) | ORAL | Status: DC
Start: 2021-02-18 — End: 2021-02-22
  Administered 2021-02-18 – 2021-02-19 (×2): 1 via ORAL

## 2021-02-18 MED ORDER — LORAZEPAM 2 MG/ML IJ SOLN
2 MG/ML | Freq: Once | INTRAMUSCULAR | Status: AC | PRN
Start: 2021-02-18 — End: 2021-02-18
  Administered 2021-02-18: 04:00:00 0.5 mg via INTRAVENOUS

## 2021-02-18 MED ORDER — QUETIAPINE FUMARATE 25 MG PO TABS
25 MG | Freq: Every evening | ORAL | Status: DC
Start: 2021-02-18 — End: 2021-02-24
  Administered 2021-02-19 – 2021-02-24 (×6): 12.5 mg via ORAL

## 2021-02-18 MED ORDER — METOPROLOL TARTRATE 25 MG PO TABS
25 MG | Freq: Two times a day (BID) | ORAL | Status: AC
Start: 2021-02-18 — End: 2021-02-18
  Administered 2021-02-19: 01:00:00 25 mg via ORAL

## 2021-02-18 MED FILL — LORAZEPAM 2 MG/ML IJ SOLN: 2 mg/mL | INTRAMUSCULAR | Qty: 1

## 2021-02-18 MED FILL — FUROSEMIDE 10 MG/ML IJ SOLN: 10 mg/mL | INTRAMUSCULAR | Qty: 10

## 2021-02-18 MED FILL — AMIODARONE HCL 200 MG PO TABS: 200 mg | ORAL | Qty: 1

## 2021-02-18 MED FILL — DIGOXIN 125 MCG PO TABS: 125 ug | ORAL | Qty: 1

## 2021-02-18 MED FILL — ELIQUIS 5 MG PO TABS: 5 mg | ORAL | Qty: 1

## 2021-02-18 MED FILL — ENTRESTO 24-26 MG PO TABS: 24-26 mg | ORAL | Qty: 1

## 2021-02-18 MED FILL — SPIRONOLACTONE 50 MG PO TABS: 50 mg | ORAL | Qty: 1

## 2021-02-18 MED FILL — METOPROLOL TARTRATE 25 MG PO TABS: 25 mg | ORAL | Qty: 1

## 2021-02-18 MED FILL — DIPHENHYDRAMINE HCL 50 MG/ML IJ SOLN: 50 mg/mL | INTRAMUSCULAR | Qty: 1

## 2021-02-18 MED FILL — FUROSEMIDE 40 MG PO TABS: 40 mg | ORAL | Qty: 1

## 2021-02-18 NOTE — Progress Notes (Addendum)
Hospitalist Progress Note      PCP: Lindell Spar, APRN - CNP    Date of Admission: 02/09/2021    Chief Complaint: Shortness of breath     Hospital Course: see H&P     Subjective: Patient resting.  Feeling better than before.  Denies any complaints.      Patient's husband tested positive for COVID yesterday.  Patient was visited by her husband earlier in the week.  COVID test ordered.     Medications:  Reviewed    Infusion Medications   ??? furosemide (LASIX) 1mg /ml infusion 20 mg/hr (02/18/21 02/20/21)   ??? sodium chloride     ??? dextrose     ??? norepinephrine Stopped (02/15/21 0945)     Scheduled Medications   ??? QUEtiapine  25 mg Oral Nightly   ??? digoxin  125 mcg Oral Daily   ??? amiodarone  200 mg Oral BID   ??? [START ON 03/03/2021] amiodarone  200 mg Oral Daily   ??? melatonin  5 mg Oral Nightly   ??? sodium chloride flush  5-40 mL IntraVENous 2 times per day   ??? apixaban  5 mg Oral BID   ??? spironolactone  50 mg Oral Daily   ??? metoprolol tartrate  25 mg Oral BID   ??? DULoxetine  60 mg Oral Daily   ??? [Held by provider] hydroxychloroquine  400 mg Oral Daily     PRN Meds: diphenhydrAMINE, diphenhydrAMINE, ipratropium-albuterol, sodium chloride (Inhalant), heparin (porcine), heparin (porcine), phenol, sodium chloride flush, sodium chloride, acetaminophen, glucose, dextrose bolus **OR** dextrose bolus, glucagon (rDNA), dextrose, hydrOXYzine HCl, albuterol, ondansetron **OR** ondansetron, polyethylene glycol      Intake/Output Summary (Last 24 hours) at 02/18/2021 0844  Last data filed at 02/18/2021 02/20/2021  Gross per 24 hour   Intake --   Output 1550 ml   Net -1550 ml       Physical Exam Performed:    BP 112/80    Pulse 100    Temp 97.6 ??F (36.4 ??C) (Oral)    Resp 18    Ht 5\' 11"  (1.803 m)    Wt 282 lb 3 oz (128 kg)    SpO2 (!) 87%    BMI 39.36 kg/m??     General appearance: No apparent distress  HEENT: Pupils equal, round, and reactive to light. Conjunctivae/corneas clear.  Neck: Supple, with full range of motion. No jugular  venous distention. Trachea midline.  Respiratory:  Normal respiratory effort. Clear to auscultation, bilaterally without Rales/Wheezes/Rhonchi.  Cardiovascular: Heart rate normal with normal rhythm with normal S1/S2 without murmurs, rubs or gallops.  Abdomen: Soft, non-tender, non-distended with normal bowel sounds.  Musculoskeletal: Bilateral lower extremity edema improving  Skin: Skin color, texture, turgor normal.  No rashes or lesions.  Neurologic:  Grossly non-focal.  Capillary Refill: Brisk,3 seconds, normal   Peripheral Pulses: +2 palpable, equal bilaterally       Labs:   Recent Labs     02/16/21  0404 02/17/21  0349 02/18/21  0658   WBC 13.0* 13.8* 12.6*   HGB 14.2 15.7 16.4*   HCT 43.4 47.5 50.1*   PLT 210 239 288     Recent Labs     02/17/21  0349 02/17/21  1833 02/18/21  0700   NA 137 137 137   K 4.3 3.8 4.0   CL 91* 89* 89*   CO2 29 31 30    BUN 26* 31* 36*   CREATININE 1.2* 1.4* 1.4*   CALCIUM 9.4  9.9 9.7     No results for input(s): AST, ALT, BILIDIR, BILITOT, ALKPHOS in the last 72 hours.  Recent Labs     02/15/21  1117   INR 1.23*     No results for input(s): CKTOTAL, TROPONINI in the last 72 hours.    Urinalysis:      Lab Results   Component Value Date/Time    NITRU Negative 02/09/2021 07:13 PM    WBCUA 3-5 02/09/2021 07:13 PM    BACTERIA RARE 04/19/2017 11:01 AM    RBCUA 3-4 02/09/2021 07:13 PM    BLOODU Negative 02/09/2021 07:13 PM    SPECGRAV 1.015 02/09/2021 07:13 PM    GLUCOSEU Negative 02/09/2021 07:13 PM       Radiology:  Marylene Buerger MODIFIED BARIUM SWALLOW W VIDEO   Final Result      Aspiration with thin liquids      Please see speech pathology assessment for dietary recommendations.      XR CHEST PORTABLE   Final Result      Mild left basilar airspace disease.      XR CHEST PORTABLE   Final Result      1. Endotracheal tube in stable position. Right IJ central venous catheter placed with the tip in the distal SVC in satisfactory position   2. Stable cardiac enlargement with no acute segmental  consolidation               XR CHEST PORTABLE   Final Result   Impression:       Tip of endotracheal tube is 5 cm the carina.      Lungs are clear. No pneumothorax.      CT CHEST PULMONARY EMBOLISM W CONTRAST   Final Result      1. No evidence of any pulmonary embolism.   2. Four-chamber cardiac enlargement with reflux of contrast into the hepatic veins. This can be seen with elevated right heart pressures.   3. No acute pulmonary process.      XR CHEST PORTABLE   Final Result      Stable cardiac enlargement      No acute consolidation                Assessment/Plan:    Active Hospital Problems    Diagnosis    ??? Paroxysmal atrial fibrillation (HCC) [I48.0]      Priority: Medium   ??? Atrial flutter with rapid ventricular response (HCC) [I48.92]      Priority: Medium   ??? Dilated cardiomyopathy (HCC) [I42.0]      Priority: Medium   ??? Acute respiratory failure with hypoxia (HCC) [J96.01]      Priority: Medium   ??? Lactic acidosis [E87.2]      Priority: Medium   ??? CHF (congestive heart failure), NYHA class I, acute on chronic, combined (HCC) [I50.43]      Priority: Medium   ??? Shortness of breath [R06.02]      Priority: Medium     60 yo female with a PMH of NHL, SLE who presented to the ED with chief complaints of cough and shortness of breath for the past 6 weeks that got worse recently in the past few days  ??  Acute Exacerbation of New-Onset HFrEF   Cardiogenic shock  Recent ECHO on 6/17 with severe dilation of LV, EF of <20%, severe diffuse hypokinesis and grade 3 diastolic dysfunction.  - Off Milrinone since 6/25. Off Levophed  - IV lasix transitioned to PO today  -  Enteresto resumed  - Requires ischemic w/u, consider when euvolemic??  - Cards following - appreciate recs    Acute hypoxic respiratory failure 2/2 acute HFrEF s/p a flutter ablation (6/24)  - S/p extubation 02/15/2021  - Off sedation  - Off oxygen  - Resolved  ??  New-Onset Atrial Flutter??s/p ablation??(6/24)??  -??S/p ablation w/ EP 6/24  - Status post heparin  drip.  Transition to Eliquis twice daily 06/28  - Received a dose of digoxin 125 mcgX1 and lopressor resumed 06/28  - Cont Po digoxin. Lopressor changed to toprol  - Off Amiodarone gtt and on Po amio now  - Cardiology following, appreciate recommendation    AKI  - Likely 2/2 hypoperfusion due to cardiogenic shock  - Avoid nephrotoxic medications  - Monitor creatinine, creatinine 1.4 today    Insomnia  - Patient has been unable to sleep even after doses of melatonin and Ambien  - Seroquel 25 mg nightly was started  - Pt sleepy today, seroquel reduced to 12.5 mg nightly    Anxiety  - Continue cymbalta 60 mg PO daily     SLE   - Holding Plaquenil 400 mg PO daily    OA   - Holding Relafen    COVID rule out  - Has been tested positive in 06/29 and has been visiting the patient  - COVID test ordered  - Droplet plus isolation    Morbid obesity BMI 40, complicates overall care    Non-Hodgkin's lymphoma status postchemo 2006-2007    DVT Prophylaxis: Eliquis  Diet: ADULT DIET; Dysphagia - Soft and Bite Sized; Mildly Thick (Nectar)  Code Status: Full Code    PT/OT Eval Status: Ordered    Dispo -pending hemodynamic stability, cardiology recs, PT OT, placement    Oneal Grout, MD

## 2021-02-18 NOTE — Progress Notes (Signed)
Patient covid PCR test collected at walked to lab

## 2021-02-18 NOTE — Progress Notes (Signed)
Speech Language Pathology  Facility/Department: [facility] TELEMETRY  Dysphagia Daily Treatment Note    NAME: Molly [name]  DOB: Oct 20, 1960  MRN: [id number]    Patient Diagnosis(es):   Patient Active Problem List    Diagnosis Date Noted   ??? Systemic lupus erythematosus ([place]) 09/22/2014   ??? Paroxysmal atrial fibrillation (HCC)    ??? Atrial flutter with rapid ventricular response ([place])    ??? Dilated cardiomyopathy ([place])    ??? Acute respiratory failure with hypoxia ([place])    ??? Lactic acidosis    ??? CHF (congestive heart failure), NYHA class I, acute on chronic, combined ([place]) 02/09/2021   ??? Acute on chronic congestive heart failure ([place]) 02/01/2021   ??? Shortness of breath 01/29/2021   ??? Acute bacterial sinusitis 12/30/2020   ??? Seasonal allergies 05/08/2019   ??? 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 High risk medication use 01/25/2016   ??? Morbid obesity with BMI of 40.0-44.9, adult ([place]) 02/07/2014   ??? Vitamin D deficiency 09/19/2013   ??? Impaired glucose tolerance 02/06/2013   ??? HLD (hyperlipidemia) 02/06/2013   ??? History of non-Hodgkin's lymphoma      Allergies:   Allergies   Allergen Reactions   ??? Sulfa Antibiotics Rash     Recent Chest Xray 02/15/21  Mild left basilar airspace disease.    Chart reviewed.    Medical Diagnosis: Shortness of breath [R06.02]  CHF (congestive heart failure), NYHA class I, acute on chronic, combined ([place]) [I50.43]  Congestive heart failure, unspecified HF chronicity, unspecified heart failure type ([place]) [I50.9]   Treatment Diagnosis: Dysphagia    BSE Impression (02/16/21)-  Pt intubated 6/24 and self extubated 6/27. Pt alert and oriented but with weak, mostly aphonic voice. pt was assessed with trials with ice chips, water by cup and straw and applesauce. ROM of oral structures was [facility].  Pt had no difficulty closing lips around spoon or drawing liquid up a straw.  Pt had no difficulty with mastication with ice chips.  There was no anterior spillage or oral residue noted  with any consistency. Pt had difficulty with initiating a dry swallow but had no difficulty with PO trials. Laryngeal movement noted with all trials. Pt noted to produce a strong reactive cough following first trial of water when consumed successive swallows of water by cup.  Provided pt with additional trials of water by cup and straw through out the session, but no other incidences of coughing or throat clearing noted, even when consumed 3 oz of water continuously. No s/s aspiration with 3 trials of applesauce. Given impaired vocal quality and recent extubation, recommend instrumental assessment prior to PO diet.   Dysphagia Diagnosis: Suspected needs further assessment    MBS results (02/16/21)-  Pt presents with mild oral/pharyngeal phase dysphagia  Oral-pt had no difficulty with A-P transport of the bolus with any consistency.  pt functional mastication with solids but there is a concern for fatigue given that pt is deconditioned. Pt with intermittent premature spillage over the base of the tongue with thin by cup  Pharyngeal- suspect impaired subglottic pressure given weak vocal quality along with impaired tongue base retraction and reduced hyolaryngeal excursion which resulted in aspiration of thin by cup with a cough reflex with first trial.  Cough was strong but not adequate enough to clear the material from the airway. With subsequent trials with thin by cup and straw, pt with deep penetration to the cords with spontaneous clearing. Attempted bolus hold  and chin tuck strategies but this did not aid in airway protection. With trials of nectar by cup and straw, shallow/flash penetration with 100% spontaneous clearing was noted. With trials with pudding and cracker, there was minimal residue remaining in the valleculae after the swallow which was cleared with a liquid wash. Although there was only one instance of aspiration with thin liquids, there were multiple instances of deep penetration placing pt at risk  for aspiration. Given pt's aphonia and recent extubation, recommend dysphagia III/soft and bite sized diet with nectar/mildly thick liquids at this time. Will continue to assess for diet upgrade in the days to come.     Pain: None expressed    Current Diet : ADULT DIET; Dysphagia - Soft and Bite Sized; Mildly Thick (Nectar)   Recommended Form of Meds:  (TBD following MBS)  Compensatory Swallowing Strategies : Eat/Feed slowly,Upright as possible for all oral intake,Alternate solids and liquids,Effortful swallow,Small bites/sips     Treatment:  Pt seen bedside to address the following goals:  Treatment/Goals  1-The patient will tolerate instrumental swallowing procedure  6/28- goal met  ??  2- The pt/caregiver will demonstrate understanding of swallowing recommendations and concerns.  6/28-??????The pt and family member were??educated to purpose of the visit, anatomy and physiology of the swallow,??impact intubation can have on swallowing function, role vocal cords play in airway protectionconcerns for aspiration,??recommendation for modified barium swallow and a description of the procedure. Both??stated comprehension and pt was in agreement with MBS.??con't goal  6/28- second session- the pt and RN were educated to the results of the MBS via review of the video from the study as well as diet recommendations, rational for nectar thick liquids, swallowing strategies and plan to continue to assess for possible diet advancement. Both stated comprehension. con't goal   6/29: Re-educated on results of MBS and diet level recommendations d/t risks associated with penetration/aspiration. Pt instructed on swallow strategies to utilize during all PO intake. Pt with adequate carry over, yet required cues for correct use. Cont.  6/30: Provided education to patient re: purpose of visit, swallow function, diet recommendations, recommendation for repeat MBS. Pt stated comprehension. Continue goal.  ??  3-The patient will tolerate least  restrictive diet without s/s aspiration or respiratory decline.   6/29: Pt seen this AM to trial current diet level. Pt assisted with oral care via toothbrush and toothpaste prior to trials. Pt provided with cup sips nectar thick liquids and soft and bite sized solids. Pt readily self fed all trials. Pt instructed on taking small bites/sips and alternating solids/liquids. Pt with adequate carry over during trials given min cues. x1 thick, heavy sounding cough following completion of swallow of multiple cup sips nectar. No additional s/s penetration/aspiration. Pt provided with x10 ice chips to target effortful swallows. Pt completed with adequate effort x10. Recommend continue current diet level with use of strategies. Cont.   6/30: Patient seen up in recliner on room air, eating breakfast items (nectar thick via cup, yogurt), and seen with whole meds in puree (administered by RN). Voice remains aphonic. Pt tolerated puree well with no overt s/s aspiration, however pt consistently demonstrating reactive cough with nectar via cup (of note was productive of sputum x1). Given consistent cough with specifically nectar thick liquids, recommend repeat MBS to check for possible change in swallow function.  Cont goal    Patient/Family/Caregiver Education:  See goal 2 above    Compensatory Strategies:  Compensatory Swallowing Strategies : Eat/Feed slowly,Upright as possible for all  oral intake,Alternate solids and liquids,Effortful swallow,Small bites/sips      Plan:  Continue dysphagia treatment with goals per plan of care.  Diet recommendations: TBD pending repeat MBS (planned for today)  DC recommendation: TBD  Treatment: 11  D/W nursing: Magda Paganini  Needs met prior to leaving room, call button in reach.    Electronically signed by:  Gust Brooms, [place], [place], [id number]  Pg. # [phone]      If patient is discharged prior to next treatment, this note will serve as the discharge summary

## 2021-02-18 NOTE — Progress Notes (Signed)
Patient was out for a study and therefore missed during rounds.  EMR was reviewed.  She remains off supplemental oxygen.  Creatinine slightly up at 1.4 from 1.2 on Lasix drip.  I's and O's -1.5 L.    Modified barium swallow 6/30 reveals aspiration with thin and nectar thick liquids.    Assessment & Plan:   ??  1. ??Acute and new HFrEF. ??It is of unknown etiology, could be ischemic (severe CAD) versus nonischemic (PAF RVR, less likely??postchemotherapy, viral). ??Patient is volume overloaded and hypotensive requiring pressor support.   2. ??Non-Hodgkin's lymphoma status post chemotherapy, now in remission  3. ??SLE  4. ??Rheumatoid arthritis  5. ??PAFL RVR. ??Patient appears to be in atrial flutter/coarse atrial fibrillation with RVR. ??It is of unknown duration and she has not been on any anticoagulation. ??She was minimally symptomatic (minimal palpitations). Patient was started on anticoagulation and EP consulted; patient had AFL ablation on 6/24, remained in sinus post procedure.  6.  SVT 6/26, now in sinus after dig bolus on amio drip.   ??  ??  ??  -  Will change lasix gtt to lasix 40 mg bid (was on 20 bid at home)  - Resume Entresto low dose.   - Amio gtt changed to amio 200 bid x 14 days then daily per EP.   - Change lopressor 25 bid Toprol 50 mg daily  - C/w dig 125 mcg daily  - Strict I's and O's every shift and standing weights if possible, low-salt diet, daily BMP with reflex to Mg (correct lytes for goals K >4.0 and Mg > 2.0) and wean supplemental oxygen to off (or down to baseline supplemental oxygen requirements) for sats greater than 92-94%.  - Cw eliquis 5 bid.   - EP following.     If patient's diet changes to NPO, then please change meds to IV as follows:     - dig 125 mcg daily iv, Lopressor 5 mg IV every 6 hours, Lasix 40 mg IV twice daily.  - Amio drip if recurrence of AFRVR.        I would like to thank you for providing me the opportunity to participate in the care of your patient. If you have any  questions, please do not hesitate to contact me.     Laurette Schimke, MD, [facility], FHFSA  [facility]  [address]  [address] [place] [address]  Ph: [phone]  Fax: [phone]    ??

## 2021-02-18 NOTE — Procedures (Signed)
INSTRUMENTAL SWALLOW REPORT  MODIFIED BARIUM SWALLOW    NAME: Molly [name]   DOB: [birth year]  MRN: [id number]       Date of Eval: 02/18/2021     Ordering Physician: Dr. Caprice Red  Radiologist: Dr. Hassell Done     Referring Diagnosis: Dysphagia    Past Medical History:  has a past medical history of Amenorrhea, Cancer ([place]), Chronic combined systolic and diastolic CHF, NYHA class 2 ([place]), History of non-Hodgkin's lymphoma, and Systemic lupus.  Past Surgical History:  has a past surgical history that includes LEEP; knee surgery (Left); Knee Arthroplasty (Left, 10/25/2016); and joint replacement.       Type of Study: Repeat MBS       Recent CXR: Date 02/15/2021     Impression   ??   Mild left basilar airspace disease.       Patient Complaints/Reason for Referral:  Molly [name] was referred for a MBS to assess the efficiency of his/her swallow function, assess for aspiration, and to make recommendations regarding safe dietary consistencies, effective compensatory strategies, and safe eating environment.       Onset of problem:   Date of Onset: 02/09/2021    General Comment  Comments: Per admitting H&P (02/09/2021): 'Molly [name] is a 60 yo female with a PMH of non- hodgkin's lymphoma, Systemic lupus, rheumatoid arthritis who presented to the ED with chief complaints of cough and shortness of breath for the past 6 weeks that got worse recently in the past few days. As per the patient about 6 weeks ago she was having shortness of breath while walking around with decrease in her levels of activity along with cough with white to clear sputum. It was also associated with weight gain of 46 pounds and lower extremity edema. Patient saw her PCP who recommended her top get an ECHO done, results of which came back last night showing an EF of 20%. Patient has been on lasix 40 mg daily and was started on enteresto and aldactone. She does state of having mild difficulty in lying flat in bed and has been using around 1 pillow  which is her baseline. Patient was feeling worse this morning, with a cardiology appointment not until next week she decided to come to the hospital for further evaluation.  In the ED patient was found to be afebrile with stable vitals, labs were remarkable of Na of 131, normal WBC count, Pro BNP of 4707. Chest Xray with stable cardiac enlargement and CTPE with no evidence of any PE with four chambercardiac enlargement with reflux of contrast into the hepatic veins. Patient was given a dose of lasix 60 mg IV and was admitted to the hospital for treatment of CHF with cardiology consultation.  '  Subjective  Subjective: Received pt awake, alert, seated up in bed on room air.    Behavior/Cognition/Vision/Hearing:  Behavior/Cognition: Alert;Cooperative;Pleasant mood    Treatment Dx and ICD 10: dysphagia  Lateral and upright    Impressions:  Oral phase: mild oral dysphagia characterized by slowed oral prep and A-P transit. Mild oral stasis.    Pharyngeal phase: moderate pharyngeal dysphagia characterized by issues with sensation, timing, and hyolaryngeal mechanics resulting in deep penetration and tracheal aspiration of thin liquids and mildly thick liquids (inconsistently silent); attempted cup vs straw vs chin tuck; strategies not effective. Improved coordination for moderately thick liquids and solid PO, with adequate timeliness of epiglottic retraction, laryngeal excursion, and airway protection, with no further aspiration or penetration visualized. Mild pharyngeal  stasis, clearing with subsequent swallows.    Dysphagia Outcome Severity Scale: Level 4: Mild moderate dysphagia- Intermittent supervision/cueing. One - two diet consistencies restricted  Penetration-Aspiration Scale (PAS): 8 - Material Enters the airway, passes below the vocal folds, and no effort is made to eject    Recommended Diet:  Solid consistency: Minced and Moist  Liquid consistency: Moderately Thick  Liquid administration via:  Cup;Straw    Medication administration: Meds in puree    Safe Swallow Protocol:  Compensatory Swallowing Strategies : Eat/Feed slowly;Upright as possible for all oral intake;Alternate solids and liquids;Effortful swallow;Small bites/sips    Recommendations/Treatment  Requires SLP Intervention: Yes        D/C Recommendations: To be determined       Recommended Exercises:    Therapeutic Interventions: Diet tolerance monitoring;Patient/Family education    Education: Images and recommendations were reviewed with the patient following this exam.   Patient Education: Results of MBS  Patient Education Response: Verbalizes understanding    Safety Devices  Safety Devices in place: Yes  Type of devices: Call light within reach      Goals:    1-The patient will tolerate instrumental swallowing procedure  6/28- goal met  ??  2- The pt/caregiver will demonstrate understanding of swallowing recommendations and concerns.  6/28-??????The pt and family member were??educated to purpose of the visit, anatomy and physiology of the swallow,??impact intubation can have on swallowing function, role vocal cords play in airway protectionconcerns for aspiration,??recommendation for modified barium swallow and a description of the procedure. Both??stated comprehension and pt was in agreement with MBS.??con't goal  6/28- second session- the pt and RN were educated to the results of the MBS via review of the video from the study as well as diet recommendations, rational for nectar thick liquids, swallowing strategies and plan to continue to assess for possible diet advancement. Both stated comprehension. con't goal   6/29: Re-educated on results of MBS and diet level recommendations d/t risks associated with penetration/aspiration. Pt instructed on swallow strategies to utilize during all PO intake. Pt with adequate carry over, yet required cues for correct use. Cont.  6/30: Provided education to patient re: purpose of visit, swallow function, diet  recommendations, recommendation for repeat MBS. Pt stated comprehension. Continue goal.  6/30: second session: educated pt to results of MBS, and trained as to strategies (including positioning, bolus size, rate of intake, avoidance of straws). Pt assented to comprehension, however question carryover; needs ongoing support.  Cont goal  ??  3-The patient will tolerate least restrictive diet without s/s aspiration or respiratory decline.??  6/29: Pt seen this AM to trial current diet level. Pt assisted with oral care via toothbrush and toothpaste prior to trials. Pt provided with cup sips nectar thick liquids and soft and bite sized solids. Pt readily self fed all trials. Pt instructed on taking small bites/sips and alternating solids/liquids. Pt with adequate carry over during trials given min cues. x1 thick, heavy sounding cough following completion of swallow of multiple cup sips nectar. No additional s/s penetration/aspiration. Pt provided with x10 ice chips to target effortful swallows. Pt completed with adequate effort x10. Recommend continue current diet level with use of strategies. Cont.   6/30: Patient seen up in recliner on room air, eating breakfast items (nectar thick via cup, yogurt), and seen with whole meds in puree (administered by RN). Voice remains aphonic. Pt tolerated puree well with no overt s/s aspiration, however pt consistently demonstrating reactive cough with nectar via cup (of  note was productive of sputum x1). Given consistent cough with specifically nectar thick liquids, recommend repeat MBS to check for possible change in swallow function.  Cont goal    Oral Preparation / Oral Phase  Slowed oral prep and A-P transit, oral stasis    Pharyngeal Phase  Sensation and timing delay, reduced hyolaryngeal mechanics, pharyngeal stasis, deep penetration and aspiration    Esophageal Phase  Esophageal Screen: WFL      Pain   Pain Level: 0      Therapy Time:   Individual Concurrent Group Co-treatment    Time In 1045         Time Out 1115         Minutes 30            Timed Code Treatment Minutes: 0 Minutes  Total Treatment Time: 30    Plan  Diet Recommendations: downgrade to dysphagia minced & moist / moderately thick (honey) liquids / meds in puree    Discharge Plan:  TBD  Discussed with RN, Magda Paganini.  Needs within reach.    Electronically Signed by:  Gust Brooms, M.A., [place]  Speech-Language Pathologist  Pager [phone]    This document will serve as a discharge summary if pt discharges before next treatment.

## 2021-02-18 NOTE — Progress Notes (Signed)
Patient returned from xray

## 2021-02-18 NOTE — Plan of Care (Signed)
Problem: Discharge Planning  Goal: Discharge to home or other facility with appropriate resources  02/17/2021 1849 by Mickey Farber, RN  Outcome: Progressing     Problem: Chronic Conditions and Co-morbidities  Goal: Patient's chronic conditions and co-morbidity symptoms are monitored and maintained or improved  02/17/2021 1849 by Mickey Farber, RN  Outcome: Progressing     Problem: Respiratory - Adult  Goal: Achieves optimal ventilation and oxygenation  02/17/2021 1849 by Mickey Farber, RN  Outcome: Progressing     Problem: Safety - Adult  Goal: Free from fall injury  Outcome: Progressing     Problem: Skin/Tissue Integrity - Adult  Goal: Skin integrity remains intact  Outcome: Progressing     Problem: Musculoskeletal - Adult  Goal: Return mobility to safest level of function  Outcome: Progressing

## 2021-02-18 NOTE — Progress Notes (Signed)
Physical Therapy  Facility/Department: [facility] TELE[facility]RY  Physical Therapy Initial Assessment/Treatment    Name: Molly [name]  DOB: 03-07-1961  MRN: [id number]  Date of Service: 02/18/2021    Discharge Recommendations: HOLLYNN GARNO scored a 11/24 on the AM-PAC short mobility form. Current research shows that an AM-PAC score of 17 or less is typically not associated with a discharge to the patient's home setting. Based on the patient's AM-PAC score and their current functional mobility deficits, it is recommended that the patient have 5-7 sessions per week of Physical Therapy at d/c to increase the patient's independence.  At this time, this patient demonstrates complex nursing, medical, and rehabilitative needs, and would benefit from intensive rehabilitation services upon discharge from the Inpatient setting.  This patient demonstrates the ability to participate in and benefit from an intensive therapy program with a coordinated interdisciplinary team approach to foster frequent, structured, and documented communication among disciplines, who will work together to establish, prioritize, and achieve treatment goals. Please see assessment section for further patient specific details.    If patient discharges prior to next session this note will serve as a discharge summary.  Please see below for the latest assessment towards goals.         PT Equipment Recommendations  Equipment Needed:  (defer)      Patient Diagnosis(es): The primary encounter diagnosis was Shortness of breath. Diagnoses of Congestive heart failure, unspecified HF chronicity, unspecified heart failure type (HCC), Acute on chronic combined systolic and diastolic congestive heart failure (HCC), CHF (congestive heart failure), NYHA class I, acute on chronic, combined (HCC), Atrial flutter with rapid ventricular response (HCC), Dilated cardiomyopathy (HCC), and Acute respiratory failure with hypoxia (HCC) were also pertinent to this visit.  Past  Medical History:  has a past medical history of Amenorrhea, Cancer (HCC), Chronic combined systolic and diastolic CHF, NYHA class 2 (HCC), History of non-Hodgkin's lymphoma, and Systemic lupus.  Past Surgical History:  has a past surgical history that includes LEEP; knee surgery (Left); Knee Arthroplasty (Left, 10/25/2016); and joint replacement.    Assessment   Body Structures, Functions, Activity Limitations Requiring Skilled Therapeutic Intervention: Decreased functional mobility ;Decreased strength;Decreased safe awareness;Decreased cognition;Decreased endurance;Decreased balance  Assessment: 60 yo adm with cough, SOB, LE edema & was placed on vent x 3 days after heart ablation.  Pt presents with fairly significant weakness overall.  Required 2 person A to stand with a walker today & heavily relied on walker to take few steps to the chair.  Able to follow most commands but slow to respond & cognition appears impaired.  Was independent PTA & should progress well with therapy.  Recommend ongoing IP PT- pt not safe at this time to mobilize on her own.  Treatment Diagnosis: impaired mobility  Therapy Prognosis: Good  Decision Making: Medium Complexity  Requires PT Follow-Up: Yes  Activity Tolerance  Activity Tolerance: Patient limited by fatigue;Patient limited by endurance  Activity Tolerance Comments: BP 104/92, O2 sats 93% on 2L once OOB>chair     Plan   Plan  Plan:  (2-5)  Current Treatment Recommendations: Strengthening,ROM,Balance training,Functional mobility training,Transfer training,Gait training,Patient/Caregiver education & training  Safety Devices  Type of Devices: Left in chair,Chair alarm in place,Nurse notified,Call light within reach  Restraints  Restraints Initially in Place: No     Restrictions  Position Activity Restriction  Other position/activity restrictions: up as tol prn     Subjective   General  Chart Reviewed: Yes  Additional Pertinent Hx: Adm  6/21- direct adm from PCP office for SOB, LE  edema, cough.  Pt has fairly new diagnosis of CHF with EF of 20%.  CT chest (-) for PE.  Hx of NHL, SLE,  RA.  Rapid response 6/22 due to difficulty breathing.  Cath lab 6/24 for ablation, had to be intubated due to resp distress & sent to ICU afterwards.  Self-extubated 6/27.  Family / Caregiver Present: No  Referring Practitioner: Oneal Grout, MD  Diagnosis: SOB  Follows Commands: Within Functional Limits (follows majority)  Subjective  Subjective: Found in bed, agreeable to therapy.  Speaks in a whisper.  Appears disoriented.         Social/Functional History  Social/Functional History  Lives With: Spouse (& 2 dogs)  Type of Home: House  Home Layout: One level  Home Access: Stairs to enter with rails  Entrance Stairs - Number of Steps: ? 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  Leisure & Hobbies: playing with dogs  Vision/Hearing  Vision  Vision: Within Functional Limits  Hearing  Hearing: Within functional limits    Cognition   Orientation  Orientation Level: Disoriented to time;Oriented to person (thought it was November; reports feeling groggy)     Objective               AROM RLE (degrees)  RLE AROM: WFL  AROM LLE (degrees)  LLE AROM : WFL  Strength RLE  Comment: 4/5 overall  Strength LLE  Comment: 4/5 overall           Bed mobility  Supine to Sit: Moderate assistance;Minimal assistance (Min/Mod A of 1 from flat bed with use of rail)  Transfers  Sit to Stand: 2 Person Assistance (MOD A of 2 from elev bed height; attempted initially from regular height bed & pt was unable to stand)  Stand to sit: Minimal Assistance (cues for hand placement)  Ambulation  Surface: level tile  Device: Rolling  Walker  Assistance: 2 Person assistance (MIN A of 2)  Quality of Gait: weak appearing gait, slow cadence; therapist blocking R knee initially due to pt appearing so weak but did not demo any buckling  Distance: 5 small steps bed>chair  Stairs/Curb  Stairs?: No (too weak to attempt today)     Balance  Sitting - Static: Good (pt propping self at side of bed with UEs)        Treatment included gait/transfer training, pt education.                                                      AM-PAC Score  AM-PAC Inpatient Mobility Raw Score : 11 (02/18/21 1102)  AM-PAC Inpatient T-Scale Score : 33.86 (02/18/21 1102)  Mobility Inpatient CMS 0-100% Score: 72.57 (02/18/21 1102)  Mobility Inpatient CMS G-Code Modifier : CL (02/18/21 1102)          Goals  Short Term Goals  Time Frame for Short term goals: dc  Short term goal 1: Bed Mobility SBA.  Short  term goal 2: Sit<>stand CGA.  Short term goal 3: Ambulate 56' with RW CGA.  Short term goal 4: Demo independence with LE HEP x 10 reps.  Patient Goals   Patient goals : wants to get stronger and go home       Education  Patient Education  Education Given To: Patient  Education Provided: Role of Therapy;Plan of Care  Education Provided Comments: use of call light  Education Method: Verbal  Education Outcome: Verbalized understanding;Continued education needed      Therapy Time   Individual Concurrent Group Co-treatment   Time In 0820         Time Out 0900         Minutes 40           Timed Code Treatment Minutes:   25    Total Treatment Minutes:  [address] [place] [address]

## 2021-02-18 NOTE — Progress Notes (Addendum)
Comprehensive Nutrition Assessment    Type and Reason for Visit:       Nutrition Recommendations/Plan:   1. Dysphagia minced and moist with moderately thick liquids per SLP  2. Magic cup bid  3. Will monitor nutritional adequacy, nutrition-related labs, weights, BMs, and clinical progress   4. May need to consider enteral nutrition if po intake does not improve in the next 48 hours     Malnutrition Assessment:  Malnutrition Status:  At risk for malnutrition (Comment) (02/15/21 1130)    Context:  Acute Illness       Nutrition Assessment:    Follow up:  Diet downgraded to Dysphagia minced and moist with honey thick liquids today this afternoon post MBS.  Mild oral and moderate dysphagia noted.  Po intake 25-50% meals, will add Magic cup bid to fortify meals further will additional calories and proteinn    Nutrition Related Findings:    BUN/Creat trending up; trace bilateral edema Wound Type:  (femoral puncture)       Current Nutrition Intake & Therapies:    Average Meal Intake: 26-50%,1-25%  Average Supplements Intake: None Ordered  ADULT DIET; Dysphagia - Minced and Moist; Moderately Thick (Honey)  ADULT ORAL NUTRITION SUPPLEMENT; Lunch, Dinner; Frozen Oral Supplement    Anthropometric Measures:  Height: _0  (180.3 cm)  Ideal Body Weight (IBW): 155 lbs (70 kg)       Current Body Weight: 282 lb 3 oz (128 kg),   IBW. Weight Source: Bed Scale  Current BMI (kg/m2): 39.4                          BMI Categories: Obese Class 2 (BMI 35.0 -39.9)    Estimated Daily Nutrient Needs:  Energy Requirements Based On: Kcal/kg  Weight Used for Energy Requirements: Current  Energy (kcal/day): 1540-1960  Weight Used for Protein Requirements: Current  Protein (g/day): 84-105  Method Used for Fluid Requirements: 1 ml/kcal  Fluid (ml/day): 1540-1960    Nutrition Diagnosis:   ?? Inadequate oral intake related to swallowing difficulty as evidenced by swallow study results,intake 0-25%      Nutrition Interventions:   Food and/or Nutrient  Delivery: Continue Current Diet,Start Oral Nutrition Supplement  Nutrition Education/Counseling: No recommendation at this time  Coordination of Nutrition Care: Continue to monitor while inpatient       Goals:  Previous Goal Met: Progressing toward Goal(s)  Goals: other (specify)  Specify Other Goals: pt will tolerate the most appropriate form of nutrition to meet >75% energy needs    Nutrition Monitoring and Evaluation:      Food/Nutrient Intake Outcomes: Diet Advancement/Tolerance,Supplement Intake,Food and Nutrient Intake  Physical Signs/Symptoms Outcomes: Biochemical Data,Weight,GI Status    Discharge Planning:    Too soon to determine     Antonino Nienhuis, [place], RD, LD  Contact: [phone]

## 2021-02-18 NOTE — Progress Notes (Signed)
Electrophysiology - PROGRESS NOTE    Admit Date: 02/09/2021     Chief Complaint: AF/AFL     Interval History:   Patient seen and examined and notes reviewed. Patient is being followed for AF/AFL.  Patient had presented with progressively worsening shortness of breath ongoing for the past 6 weeks.  She also noted a 40 pound weight gain.  She ended up seeing her PCP who ordered an echo that showed an EF of less than 20%.  She was then referred to the ED for further evaluation due to ongoing symptoms.  She was noted to be in atrial flutter with a poorly controlled ventricular rate.  She was started on aggressive IV diuresis.  She had gone to the EP lab on 02/12/2021 and was noted to have a drop in her saturation.  Anesthesia recommended elective intubation for the procedure.  Post procedure she was unable to be extubated and was transferred to ICU.  CXR showed increased bronchopulmonary markings prominent upper lobe veins.  She was restarted on her Lasix drip.  She was placed on a milrinone drip however her blood pressure remained low and this was discontinued over the weekend.  She was placed on pressors for her BP, now off.  She developed AF and was placed on an amiodarone drip. Extubated 6/27. Somnolent today. Changed to amio po yesterday. In NSR with short runs of AT overnight.     In: -   Out: 1550    Wt Readings from Last 2 Encounters:   02/18/21 282 lb 3 oz (128 kg)   02/01/21 (!) 336 lb (152.4 kg)       Data:   Scheduled Meds:   Scheduled Meds:  ??? QUEtiapine  25 mg Oral Nightly   ??? digoxin  125 mcg Oral Daily   ??? amiodarone  200 mg Oral BID   ??? [START ON 03/03/2021] amiodarone  200 mg Oral Daily   ??? melatonin  5 mg Oral Nightly   ??? sodium chloride flush  5-40 mL IntraVENous 2 times per day   ??? apixaban  5 mg Oral BID   ??? spironolactone  50 mg Oral Daily   ??? metoprolol tartrate  25 mg Oral BID   ??? DULoxetine  60 mg Oral Daily   ??? [Held by provider]  hydroxychloroquine  400 mg Oral Daily     Continuous Infusions:  ??? furosemide (LASIX) 1mg /ml infusion 20 mg/hr (02/17/21 0943)   ??? sodium chloride     ??? dextrose     ??? norepinephrine Stopped (02/15/21 0945)     PRN Meds:.diphenhydrAMINE, diphenhydrAMINE, ipratropium-albuterol, sodium chloride (Inhalant), heparin (porcine), heparin (porcine), phenol, sodium chloride flush, sodium chloride, acetaminophen, glucose, dextrose bolus **OR** dextrose bolus, glucagon (rDNA), dextrose, hydrOXYzine HCl, albuterol, ondansetron **OR** ondansetron, polyethylene glycol  Continuous Infusions:  ??? furosemide (LASIX) 1mg /ml infusion 20 mg/hr (02/17/21 0943)   ??? sodium chloride     ??? dextrose     ??? norepinephrine Stopped (02/15/21 0945)       Intake/Output Summary (Last 24 hours) at 02/18/2021 0808  Last data filed at 02/18/2021 0633  Gross per 24 hour   Intake --   Output 1550 ml   Net -1550 ml       CBC:   Lab Results   Component Value Date/Time    WBC 12.6 02/18/2021 06:58 AM    HGB 16.4 02/18/2021 06:58 AM    PLT 288 02/18/2021 06:58 AM     BMP:  Lab Results  Component Value Date/Time    NA 137 02/18/2021 07:00 AM    K 4.0 02/18/2021 07:00 AM    K 3.7 02/09/2021 06:40 PM    CL 89 02/18/2021 07:00 AM    CO2 30 02/18/2021 07:00 AM    BUN 36 02/18/2021 07:00 AM    CREATININE 1.4 02/18/2021 07:00 AM    GLUCOSE 119 02/18/2021 07:00 AM     INR:   Lab Results   Component Value Date/Time    INR 1.23 02/15/2021 11:17 AM    INR 1.27 02/10/2021 10:37 AM    INR 1.04 10/25/2016 11:37 AM        CARDIAC LABS  ENZYMES:No results for input(s): CKMB, CKMBINDEX, TROPONINI in the last 72 hours.    Invalid input(s): CKTOTAL;3  FASTING LIPID PANEL:  Lab Results   Component Value Date/Time    HDL 33 02/10/2021 05:22 AM    LDLCALC 72 02/10/2021 05:22 AM    TRIG 82 02/10/2021 05:22 AM     LIVER PROFILE:  Lab Results   Component Value Date/Time    AST 86 02/12/2021 03:23 PM    AST 61 01/29/2021 12:16 PM    ALT 79 02/12/2021 03:23 PM    ALT 78 01/29/2021  12:16 PM       -----------------------------------------------------------------  Telemetry: Personally reviewed  NSR with short runs of AT    Objective:   Vitals: BP 122/77    Pulse 95    Temp 98 ??F (36.7 ??C) (Oral)    Resp 18    Ht 5\' 11"  (1.803 m)    Wt 282 lb 3 oz (128 kg)    SpO2 91%    BMI 39.36 kg/m??   General appearance: somnolent  Eyes: Conjunctiva and pupils normal and reactive  Skin: Skin color, texture, turgor normal. No rashes or ecchymosis.  Neck: no JVD, supple, symmetrical, trachea midline   Lungs: , no accessory muscle use, no respiratory distress  Heart: RRR  Abdomen: soft, non-tender; bowel sounds normal  Extremities: No edema, DP +  Psychiatric: somnolent    Patient Active Problem List:     History of non-Hodgkin's lymphoma     Impaired glucose tolerance     HLD (hyperlipidemia)     Vitamin D deficiency     Morbid obesity with BMI of 40.0-44.9, adult (HCC)     Systemic lupus erythematosus (HCC)     High risk medication use     Status post total left knee replacement     Seasonal allergies     Acute bacterial sinusitis     Shortness of breath     Acute on chronic congestive heart failure (HCC)     CHF (congestive heart failure), NYHA class I, acute on chronic, combined (HCC)     Atrial flutter with rapid ventricular response (HCC)     Dilated cardiomyopathy (HCC)     Acute respiratory failure with hypoxia (HCC)     Lactic acidosis        Assessment & Plan:      1. AF/AFL  2. On amio/OAC  3. CMP - EF <20%  4. HFrEF  5. Hypoxic resp failure    60 y/o woman with a h/o morbid obesity, NHL of the L groin (2006), s/p chemo (2006-2007), SLE, RA, who p/w ongoing SOB, cough, BLE edema for approx prior 30 days, was hypotensive, BNP 4700, found to have an EF<20% per echo, grade III DD, found to be in AFL with a HR of 127  bpm, CT neg for PE, poss elevated R sided pressures, developed hypoxia on 02/10/2021 that resolved with NRB, s/p RFA of AFL (02/12/2021), became hypoxic post procedure and was unable to be  intubated, developed AF, placed on an amio gtt, extubated 02/15/2021, off pressors  CHA2DS2-VASc 2. TSH 1.6 (02/10/2021).     AF  - In NSR - short runs of AT  - S/p RFA of AFL w/ pAF noted  - On amio 200 mg twice daily x2 weeks then 200 mg daily  - Received dig x 1 (02/14/2021)  - On metoprolol 25 mg BID   - Keep K+ > 4.0 and Mg > 2.0  - Reviewed recent labs  - On Eliquis 5 mg BID  - Echo - EF < 20%, sev LV dilatation, grade III DD, RV systolic function sev reduced  - Sleep study outptient    CMP  - EF <20%  - NYHA class II/III  - QRS 104  - On metoprolol 25 mg BID - change to long acting on d/c  - Cr improving - now 1.4  - Ischemic w/u when improves   - Dr. Harrison Mons following    Ferdinand Lango CNP  [facility]    I  have spent 35 minutes in care of the patient including direct face to face time, chart preparation, reviewing diagnostic testing, other provider notes and coordinating patient care.

## 2021-02-18 NOTE — Care Coordination-Inpatient (Addendum)
COVID test  STILL pending  NEEDS to Be COLLECTED:    CM  attempted to call pt  As  She is  In R/O Covid Isolation:    CM  Spoke with RN who states  Pt  Is still having confusion and to  Contact  Her  Spouse:      CM  Will reach out to spouse  :  As  ARU is recommended  :    [facility]  Is nt able to accommodate Medicaid  And  [facility] is full till next week:  CM  was  Wanting ti see f  family /pt would be agreeable to  [name] .      CM  D/w  Arline Asp  RN liaison ;  And  Will d/w  Spouse / family ;    Will need  Pre cert  ;    Electronically signed by Milford Cage, RN on 02/18/2021 at 2:16 PM      ++++++++++++++++++++++++++++++++++++++++++++++        Case Management Assessment           Daily Note                 Date/ Time of Note: 02/18/2021 12:10 PM         Note completed by: Milford Cage, RN    Patient Name: Molly [name]  Date of Birth: 1961-02-02    Diagnosis:Shortness of breath [R06.02]  CHF (congestive heart failure), NYHA class I, acute on chronic, combined (HCC) [I50.43]  Congestive heart failure, unspecified HF chronicity, unspecified heart failure type (HCC) [I50.9]  Patient Admission Status: Inpatient    Date of Admission:02/09/2021  6:11 PM Length of Stay: 9 GLOS: GMLOS: 4.3    Current Plan of Care:     Transfer  From  ICU  To  [facility] [place] yesterday :    PT/OT Eval Status: Ordered  ??  Dispo -pending diuresis, transition off of amnio/Lasix drip, cardiology recs, PT OT  ________________________________________________________________________________________  PT AM-PAC: 11 / 24 per last evaluation on: 02/18/2021    OT AM-PAC: 12 / 24 per last evaluation on: 02/18/2021    DME Needs for discharge:   TBD  rec   ARU  Pending  Covid  R/o  :   ________________________________________________________________________________________  Discharge Plan: Home with Home Health Care: VS  SNF      Tentative discharge date: tbd    Current barriers to discharge: Cardiology clearance  transition  Off Amio gtt to PO ,      Referrals completed: Not Applicable    Resources/ information provided: Not indicated at this time  ________________________________________________________________________________________  Case Management Notes:    Transfer  Out ot  ICU  :  To [facility] [place]      The original plan was to return home.   Will re-assess following PT/OT evaluations and recommendations  Noted    Rec:  Placement  ;  Pending  Covid  testing ,  As  Our  ARU does not accept  Covid  Patients .    Cont to follow  .     ??  ??Patient is from home with spouse, completely independent pta. ??CM will continue to follow for discharge needs      Molly [name] and her family were provided with choice of provider; she and her family are in agreement with the discharge plan.    Care Transition Patient: No    Milford Cage, RN  [facility]  Case Management Department  Ph: [phone]

## 2021-02-18 NOTE — Progress Notes (Signed)
Patient off floor to xray for MBS

## 2021-02-18 NOTE — Progress Notes (Signed)
Occupational Therapy  Facility/Department: [facility] TELEMETRY  Occupational Therapy Initial Assessment and Treatment Note     Name: Molly [name]  DOB: [birth year]  MRN: [id number]  Date of Service: 02/18/2021    Discharge Recommendations:Molly [name] scored a 12/24 on the AM-PAC ADL Inpatient form. Current research shows that an AM-PAC score of 17 or less is typically not associated with a discharge to the patient's home setting. Based on the patient's AM-PAC score and their current ADL deficits, it is recommended that the patient have 5-7 sessions per week of Occupational Therapy at d/c to increase the patient's independence.  At this time, this patient demonstrates complex nursing, medical, and rehabilitative needs, and would benefit from intensive rehabilitation services upon discharge from the Inpatient setting.  This patient demonstrates the ability to participate in and benefit from an intensive therapy program with a coordinated interdisciplinary team approach to foster frequent, structured, and documented communication among disciplines, who will work together to establish, prioritize, and achieve treatment goals. Please see assessment section for further patient specific details.    If patient discharges prior to next session this note will serve as a discharge summary.  Please see below for the latest assessment towards goals.        OT Equipment Recommendations  Equipment Needed: No  Other: defer to next care facility       Patient Diagnosis(es): The primary encounter diagnosis was Shortness of breath. Diagnoses of Congestive heart failure, unspecified HF chronicity, unspecified heart failure type (HCC), Acute on chronic combined systolic and diastolic congestive heart failure (HCC), CHF (congestive heart failure), NYHA class I, acute on chronic, combined (HCC), Atrial flutter with rapid ventricular response (HCC), Dilated cardiomyopathy (HCC), and Acute respiratory failure with hypoxia (HCC) were also  pertinent to this visit.  Past Medical History:  has a past medical history of Amenorrhea, Cancer (HCC), Chronic combined systolic and diastolic CHF, NYHA class 2 (HCC), History of non-Hodgkin's lymphoma, and Systemic lupus.  Past Surgical History:  has a past surgical history that includes LEEP; knee surgery (Left); Knee Arthroplasty (Left, 10/25/2016); and joint replacement.    Treatment Diagnosis: impaired ADLs / functional transfers / decreased BUE functional use and impaired mentation 2/2 CHF      Assessment   Performance deficits / Impairments: Decreased functional mobility ;Decreased endurance;Decreased coordination;Decreased ADL status;Decreased strength;Decreased ROM;Decreased safe awareness;Decreased cognition;Decreased fine motor control;Decreased high-level IADLs;Decreased balance  Assessment: Pt from home with spouse. Pt is independent/ works full time at baseline. Pt demo functioning far below stated baseline level. Pt is limited by poor endurance / decreased processing / generalized weakness / deconditioning 2/2 prolonged hospital course.  Pt would benefit from ongoing inpt OT at d/c to maximize functional level. Will follow as inpt  Treatment Diagnosis: impaired ADLs / functional transfers / decreased BUE functional use and impaired mentation 2/2 CHF  Prognosis: Fair  Decision Making: Medium Complexity  REQUIRES OT FOLLOW-UP: Yes  Activity Tolerance  Activity Tolerance: Patient limited by fatigue;Patient Tolerated treatment well  Activity Tolerance Comments: Pt demo desat in bed laying flat--RN placed pt on 2L o2 - OT sats 90-93% throughout session.        Plan   Plan  Times per Week: 2-5x  Times per Day: Daily  Current Treatment Recommendations: Endurance training,Patient/Caregiver education & training,Safety education & training,Self-Care / ADL,Strengthening,Cognitive reorientation,Coordination training,Equipment evaluation, education, & procurement,Functional mobility training      Restrictions  Position Activity Restriction  Other position/activity restrictions: up as tol prn  Subjective   General  Chart Reviewed: Yes  Additional Pertinent Hx: Admit 6/21 with SOB and CHF. 6/27 CXray - Mild left basilar airspace disease. Pt intubated in ICU 2/2 atrial flutter ablation 6/24-6/27.      PMHX: Amenorrhea, Cancer (HCC), History of non-Hodgkin's lymphoma, Obesity, and Systemic lupus.She has a past surgical history that includes LEEP; knee surgery (Left); Knee Arthroplasty (Left, 10/25/2016); and joint replacement.  Family / Caregiver Present: No  Diagnosis: SOB  Subjective  Subjective: " I don't know ' pt found resting in bed -- pt appears disoriented and confused regarding recent events/ orientation.  Pt agreeable for OOB/OT eval and tx     Social/Functional History  Social/Functional History  Lives With: Spouse (& 2 dogs)  Type of Home: House  Home Layout: One level  Home Access: Stairs to enter with rails  Entrance Stairs - Number of Steps: ? 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 education.    Safety Devices  Type of Devices: Left in chair;Chair alarm in place;Nurse notified;Call light within reach     Toilet Transfers  Toilet - Technique: Stand step  Equipment Used: Standard bedside commode  Toilet Transfer: Minimal assistance;2 Person assistance;Moderate assistance  Toilet Transfers Comments: simulated with chair -- Pt needing mod v.cues for transfers 2/2 decreased mentation  AROM: Generally decreased, functional  Strength: Grossly decreased, non-functional (pt demo global  weakness and demo difficultly holding onto walker / utensils)  Coordination: Grossly decreased, non-functional (poor FMC ? GMC-- pt demo poor grasp with self feeding.)  Tone: Normal  Sensation: Intact  ADL  Feeding: Minimal assistance;Contact guard assistance;Verbal cueing;Setup;Increased time to complete  Feeding Skilled Clinical Factors: struggles to hold items and demo poor motor planning to mouth.  BUE weakness  Grooming: Minimal assistance;Increased time to complete;Verbal cueing;Setup  LE Dressing: Dependent/Total  LE Dressing Skilled Clinical Factors: with socks  Toileting: Dependent/Total  Toileting Skilled Clinical Factors: currenlty with foley / briefs     Activity Tolerance  Activity Tolerance Comments: BP 104/92, O2 sats 93% on 2L once OOB>chair     Transfers  Sit to stand: 2 Person assistance;Moderate assistance;Dependent/Total  Stand to sit: Moderate assistance;2 Person assistance;Dependent/Total  Transfer Comments: from raised bed pulling up on walker. Pt unable to stand from std height bed  Vision - Basic Assessment  Prior Vision: No visual deficits  Cognition  Overall Cognitive Status: Exceptions  Arousal/Alertness: Delayed responses to stimuli  Following Commands: Follows one step commands with repetition  Attention Span: Difficulty attending to directions  Memory: Decreased short term memory;Decreased recall of recent events;Decreased recall of precautions  Safety Judgement: Decreased awareness of need for safety;Decreased awareness of need for assistance  Insights: Not aware of deficits  Initiation: Requires cues for all  Sequencing: Requires cues for some  Cognition Comment: Significant impairment with processing and attending to task this date.  Orientation  Overall Orientation Status: Impaired  Orientation Level: Disoriented to time;Oriented to person     Education Given To: Patient  Education Provided: Role of Therapy;Plan of Dietitian  Training;Fall Prevention  Strategies;IADL Safety  Education Method: Demonstration;Verbal  Barriers to Learning: Cognition  Education Outcome: Continued education needed     AM-PAC Score  AM-PAC Inpatient Daily Activity Raw Score: 12 (02/18/21 1011)  AM-PAC Inpatient ADL T-Scale Score : 30.6 (58/09/98 1011)  ADL Inpatient CMS 0-100% Score: 66.57 (02/18/21 1011)  ADL Inpatient CMS G-Code Modifier : CL (02/18/21 1011)    Goals  Short Term Goals  Time Frame for Short term goals: at d/c  Short Term Goal 1: Stance x 5 mins with CGA for ADLs /IADLs  Short Term Goal 2: Grooming with setup and <3 v.cues for completion  Short Term Goal 3: Commode transfers with Min A -RTS prn  Short Term Goal 4: Feed self with setup  Short Term Goal 5: Oriented to environment 4/5 attempts.  Patient Goals   Patient goals : unable to state     Therapy Time   Individual Concurrent Group Co-treatment   Time In 0819         Time Out 0859         Minutes 40              Timed Code Treatment Minutes:   25 mins     Total Treatment Minutes:  40 mins       Dennison Mascot, OT

## 2021-02-19 ENCOUNTER — Ambulatory Visit: Payer: MEDICAID | Primary: Gerontology

## 2021-02-19 LAB — CBC WITH AUTO DIFFERENTIAL
Basophils %: 0.8 %
Basophils Absolute: 0.1 10*3/uL (ref 0.0–0.2)
Eosinophils %: 2.2 %
Eosinophils Absolute: 0.3 10*3/uL (ref 0.0–0.6)
Hematocrit: 49 % — ABNORMAL HIGH (ref 36.0–48.0)
Hemoglobin: 16.2 g/dL — ABNORMAL HIGH (ref 12.0–16.0)
Lymphocytes %: 19.5 %
Lymphocytes Absolute: 2.6 10*3/uL (ref 1.0–5.1)
MCH: 29.3 pg (ref 26.0–34.0)
MCHC: 33 g/dL (ref 31.0–36.0)
MCV: 88.6 fL (ref 80.0–100.0)
MPV: 7.9 fL (ref 5.0–10.5)
Monocytes %: 21.4 %
Monocytes Absolute: 2.9 10*3/uL — ABNORMAL HIGH (ref 0.0–1.3)
Neutrophils %: 56.1 %
Neutrophils Absolute: 7.5 10*3/uL (ref 1.7–7.7)
Platelets: 346 10*3/uL (ref 135–450)
RBC: 5.54 M/uL — ABNORMAL HIGH (ref 4.00–5.20)
RDW: 15.7 % — ABNORMAL HIGH (ref 12.4–15.4)
WBC: 13.4 10*3/uL — ABNORMAL HIGH (ref 4.0–11.0)

## 2021-02-19 LAB — BASIC METABOLIC PANEL
Anion Gap: 17 — ABNORMAL HIGH (ref 3–16)
BUN: 51 mg/dL — ABNORMAL HIGH (ref 7–20)
CO2: 30 mmol/L (ref 21–32)
Calcium: 9.8 mg/dL (ref 8.3–10.6)
Chloride: 86 mmol/L — ABNORMAL LOW (ref 99–110)
Creatinine: 2.4 mg/dL — ABNORMAL HIGH (ref 0.6–1.1)
GFR African American: 25 — AB (ref >60)
GFR Non-African American: 21 — AB (ref >60)
Glucose: 104 mg/dL — ABNORMAL HIGH (ref 70–99)
Potassium: 3.8 mmol/L (ref 3.5–5.1)
Sodium: 133 mmol/L — ABNORMAL LOW (ref 136–145)

## 2021-02-19 LAB — COVID-19, RAPID: SARS-CoV-2, NAAT: NOT DETECTED

## 2021-02-19 LAB — MAGNESIUM: Magnesium: 2.4 mg/dL (ref 1.80–2.40)

## 2021-02-19 MED ORDER — SODIUM CHLORIDE 0.9 % IV BOLUS
0.9 % | Freq: Once | INTRAVENOUS | Status: DC
Start: 2021-02-19 — End: 2021-02-20

## 2021-02-19 MED ORDER — SODIUM CHLORIDE 0.9 % IV BOLUS
0.9 % | Freq: Once | INTRAVENOUS | Status: AC
Start: 2021-02-19 — End: 2021-02-19
  Administered 2021-02-19: 20:00:00 500 mL via INTRAVENOUS

## 2021-02-19 MED ORDER — SODIUM CHLORIDE 0.9 % IV SOLN
0.9 % | INTRAVENOUS | Status: AC
Start: 2021-02-19 — End: 2021-02-19
  Administered 2021-02-19: 17:00:00 via INTRAVENOUS

## 2021-02-19 MED FILL — SPIRONOLACTONE 25 MG PO TABS: 25 mg | ORAL | Qty: 1

## 2021-02-19 MED FILL — METOPROLOL TARTRATE 25 MG PO TABS: 25 mg | ORAL | Qty: 1

## 2021-02-19 MED FILL — AMIODARONE HCL 200 MG PO TABS: 200 mg | ORAL | Qty: 1

## 2021-02-19 MED FILL — SEROQUEL 25 MG PO TABS: 25 mg | ORAL | Qty: 1

## 2021-02-19 MED FILL — ENTRESTO 24-26 MG PO TABS: 24-26 mg | ORAL | Qty: 1

## 2021-02-19 MED FILL — DIGOXIN 125 MCG PO TABS: 125 ug | ORAL | Qty: 1

## 2021-02-19 MED FILL — MELATONIN 5 MG PO TBDP: 5 mg | ORAL | Qty: 1

## 2021-02-19 MED FILL — ELIQUIS 5 MG PO TABS: 5 mg | ORAL | Qty: 1

## 2021-02-19 MED FILL — DULOXETINE HCL 60 MG PO CPEP: 60 mg | ORAL | Qty: 1

## 2021-02-19 MED FILL — FUROSEMIDE 40 MG PO TABS: 40 mg | ORAL | Qty: 1

## 2021-02-19 MED FILL — METOPROLOL SUCCINATE ER 50 MG PO TB24: 50 mg | ORAL | Qty: 1

## 2021-02-19 NOTE — Care Coordination-Inpatient (Addendum)
CM  Met with pt  SIL  , Rich Reining  Who is here  At  Pt  Bedside  Ans  Requesting  Update :    _ pt still In  Isolation Covid r/o :  CM  Had  RN Lila  Call lab to track test or see if it resulted  From  1400 yest , and they stated  The  Test  Did not go out to  [place] this am :    RN  Messaged  Dr Caprice Red to request  A  Rapid  Test  To  See  If we  Could  D/c  Isolation : reslts  Pending .    Patient  SIL  With  May questions  Re  Update  And  MD communication :  Pt  Condition .     CM  D/W her  Spouse  Is  Point of  Contact  :  And  She  Is  aware  But states  She  Also helps  Out with planning  B/c  Of her  Medical background.       CM  Sent  PS message  To  MD  To  Call  Her  With update  :    Patient  Approved  By Supervisor  To  Go into pt room (  Offered  N95 mask  But  States  She is not worried  As  She  Has  Already  Been  Around her  And her  Brother  .     CM  Answered  Questions  And  Provide  Cm  Contact  Number  For  Future  Follow up :     [facility] is  Able to accept  And  Will confirm  With  Spouse if that is their  First choice  For  placment  . As  Pt  Will need  [facility] pre cert  nad  Likely need  Updated  PTOT nots  B/c of holiday .       Dr Caprice Red  Returned  To  Patient  Room  To  Update.    Electronically signed by Carlye Grippe, RN on 02/19/2021 at 12:05 PM     +++++++++++++++++++++++++++++++++++++++++             Case Management Assessment           Daily Note                 Date/ Time of Note: 02/19/2021 10:17 AM         Note completed by: Carlye Grippe, RN    Patient Name: Molly [name]  Date of Birth: [birth year]    Diagnosis:Shortness of breath [R06.02]  CHF (congestive heart failure), NYHA class I, acute on chronic, combined ([place]) [I50.43]  Congestive heart failure, unspecified HF chronicity, unspecified heart failure type ([place]) [I50.9]  Patient Admission Status: Inpatient    Date of Admission:02/09/2021  6:11 PM Length of Stay: 10 GLOS: GMLOS: 4.3    Current Plan  of Care:     Transfer  From  ICU  To  [facility] yesterday :    Cardiology  following  :   ??  Dispo -pending diuresis, transition off of amnio/Lasix drip, cardiology recs, PT OT   ________________________________________________________________________________________  PT AM-PAC: 11 / 24 per last evaluation on: 02/18/2021    OT AM-PAC: 12 /  24 per last evaluation on: 02/18/2021    DME Needs for discharge:   TBD  rec   ARU  Pending  Covid  R/o  :   ________________________________________________________________________________________  Discharge Plan: Home with [place]: VS  SNF      Tentative discharge date: tbd    Current barriers to discharge: Cardiology clearance  transition  Off Amio gtt to PO ,     Referrals completed: Not Applicable    Resources/ information provided: Not indicated at this time  ________________________________________________________________________________________  Case Management Notes:    Transfer  Out ot  ICU  :  To [facility] [place]      The original plan was to return home.   Will re-assess following PT/OT evaluations and recommendations  Noted    Rec:  Placement:    Covid  Test still pending     -     Our  ARU does not accept  Covid  Patients or Medicaid  -      [facility]  Has  No beds  Til  Next  Week      -  [facility]  Able to accept   , [facility] [phone].  Spoke with pt spouse   Who was  Agreeable and  They  Will likely start  Pre cert today :    Per  MD  willstill  Need  A  Few  More  Days  In patient  ;.    Cont to follow  .     ??  ??Patient is from home with spouse, completely independent pta. ??CM will continue to follow for discharge needs      F[place] and her family were provided with choice of provider; she and her family are in agreement with the discharge plan.    Care Transition Patient: No    Carlye Grippe, RN  [facility]  Case Management Department  Ph: [phone]

## 2021-02-19 NOTE — Plan of Care (Signed)
Problem: Respiratory - Adult  Goal: Achieves optimal ventilation and oxygenation  Outcome: Progressing  Flowsheets (Taken 02/18/2021 1616 by Benedetto Coons, RN)  Achieves optimal ventilation and oxygenation: Assess for changes in respiratory status     Problem: Cardiovascular - Adult  Goal: Maintains optimal cardiac output and hemodynamic stability  Outcome: Progressing     Problem: Cardiovascular - Adult  Goal: Absence of cardiac dysrhythmias or at baseline  Outcome: Progressing

## 2021-02-19 NOTE — Progress Notes (Addendum)
Speech Language Pathology  Facility/Department: [facility] 6 SOUTH TELEMETRY  Dysphagia Daily Treatment Note    NAME: Molly [name]  DOB: 1961-07-20  MRN: [id number]    Patient Diagnosis(es):   Patient Active Problem List    Diagnosis Date Noted   ??? Systemic lupus erythematosus ([place]) 09/22/2014   ??? Atrial fibrillation and flutter ([place])    ??? Paroxysmal atrial fibrillation (HCC)    ??? Atrial flutter with rapid ventricular response ([place])    ??? Dilated cardiomyopathy ([place])    ??? Acute respiratory failure with hypoxia ([place])    ??? Lactic acidosis    ??? CHF (congestive heart failure), NYHA class I, acute on chronic, combined ([place]) 02/09/2021   ??? Acute on chronic congestive heart failure ([place]) 02/01/2021   ??? Shortness of breath 01/29/2021   ??? Acute bacterial sinusitis 12/30/2020   ??? Seasonal allergies 05/08/2019   ??? 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 High risk medication use 01/25/2016   ??? Morbid obesity with BMI of 40.0-44.9, adult ([place]) 02/07/2014   ??? Vitamin D deficiency 09/19/2013   ??? Impaired glucose tolerance 02/06/2013   ??? HLD (hyperlipidemia) 02/06/2013   ??? History of non-Hodgkin's lymphoma      Allergies:   Allergies   Allergen Reactions   ??? Sulfa Antibiotics Rash     Recent Chest Xray 02/15/21  Mild left basilar airspace disease.    Chart reviewed.    Medical Diagnosis: Shortness of breath [R06.02]  CHF (congestive heart failure), NYHA class I, acute on chronic, combined ([place]) [I50.43]  Congestive heart failure, unspecified HF chronicity, unspecified heart failure type ([place]) [I50.9]   Treatment Diagnosis: Dysphagia    BSE Impression (02/16/21)-  Pt intubated 6/24 and self extubated 6/27. Pt alert and oriented but with weak, mostly aphonic voice. pt was assessed with trials with ice chips, water by cup and straw and applesauce. ROM of oral structures was [facility].  Pt had no difficulty closing lips around spoon or drawing liquid up a straw.  Pt had no difficulty with mastication with ice chips.  There was  no anterior spillage or oral residue noted with any consistency. Pt had difficulty with initiating a dry swallow but had no difficulty with PO trials. Laryngeal movement noted with all trials. Pt noted to produce a strong reactive cough following first trial of water when consumed successive swallows of water by cup.  Provided pt with additional trials of water by cup and straw through out the session, but no other incidences of coughing or throat clearing noted, even when consumed 3 oz of water continuously. No s/s aspiration with 3 trials of applesauce. Given impaired vocal quality and recent extubation, recommend instrumental assessment prior to PO diet.   Dysphagia Diagnosis: Suspected needs further assessment    Initial MBS results (02/16/21)-  Pt presents with mild oral/pharyngeal phase dysphagia  Oral-pt had no difficulty with A-P transport of the bolus with any consistency.  pt functional mastication with solids but there is a concern for fatigue given that pt is deconditioned. Pt with intermittent premature spillage over the base of the tongue with thin by cup  Pharyngeal- suspect impaired subglottic pressure given weak vocal quality along with impaired tongue base retraction and reduced hyolaryngeal excursion which resulted in aspiration of thin by cup with a cough reflex with first trial.  Cough was strong but not adequate enough to clear the material from the airway. With subsequent trials with thin by cup and straw, pt with deep  penetration to the cords with spontaneous clearing. Attempted bolus hold and chin tuck strategies but this did not aid in airway protection. With trials of nectar by cup and straw, shallow/flash penetration with 100% spontaneous clearing was noted. With trials with pudding and cracker, there was minimal residue remaining in the valleculae after the swallow which was cleared with a liquid wash. Although there was only one instance of aspiration with thin liquids, there were  multiple instances of deep penetration placing pt at risk for aspiration. Given pt's aphonia and recent extubation, recommend dysphagia III/soft and bite sized diet with nectar/mildly thick liquids at this time. Will continue to assess for diet upgrade in the days to come.     Repeat MBS results (02/18/2021)  Oral phase: mild oral dysphagia characterized by slowed oral prep and A-P transit. Mild oral stasis.  ??  Pharyngeal phase: moderate pharyngeal dysphagia characterized by issues with sensation, timing, and hyolaryngeal mechanics resulting in deep penetration and tracheal aspiration of thin liquids and mildly thick liquids (inconsistently silent); attempted cup vs straw vs chin tuck; strategies not effective. Improved coordination for moderately thick liquids and solid PO, with adequate timeliness of epiglottic retraction, laryngeal excursion, and airway protection, with no further aspiration or penetration visualized. Mild pharyngeal stasis, clearing with subsequent swallows.    Pain: None expressed    Current Diet : ADULT ORAL NUTRITION SUPPLEMENT; Lunch, Dinner; Frozen Oral Supplement  ADULT DIET; Dysphagia - Soft and Bite Sized; Moderately Thick (Honey)   Recommended Form of Meds:  (TBD following MBS)  Compensatory Swallowing Strategies : Eat/Feed slowly,Upright as possible for all oral intake,Alternate solids and liquids,Effortful swallow,Small bites/sips     Treatment:  Pt seen bedside to address the following goals:  Treatment/Goals  1-The patient will tolerate instrumental swallowing procedure  6/28- goal met  ??  2- The pt/caregiver will demonstrate understanding of swallowing recommendations and concerns.  6/28-??????The pt and family member were??educated to purpose of the visit, anatomy and physiology of the swallow,??impact intubation can have on swallowing function, role vocal cords play in airway protectionconcerns for aspiration,??recommendation for modified barium swallow and a description of the  procedure. Both??stated comprehension and pt was in agreement with MBS.??con't goal  6/28- second session- the pt and RN were educated to the results of the MBS via review of the video from the study as well as diet recommendations, rational for nectar thick liquids, swallowing strategies and plan to continue to assess for possible diet advancement. Both stated comprehension. con't goal   6/29: Re-educated on results of MBS and diet level recommendations d/t risks associated with penetration/aspiration. Pt instructed on swallow strategies to utilize during all PO intake. Pt with adequate carry over, yet required cues for correct use. Cont.  6/30: Provided education to patient re: purpose of visit, swallow function, diet recommendations, recommendation for repeat MBS. Pt stated comprehension.  7/1: Educated pt re: role of SLP, purpose of visit, swallow function, prior MBSs, thickened liquid use/rationale, general aspiration precautions. Pt still somewhat confused, but appears to have improved comprehension from prior visit.  Continue goal.  ??  3-The patient will tolerate least restrictive diet without s/s aspiration or respiratory decline.   6/29: Pt seen this AM to trial current diet level. Pt assisted with oral care via toothbrush and toothpaste prior to trials. Pt provided with cup sips nectar thick liquids and soft and bite sized solids. Pt readily self fed all trials. Pt instructed on taking small bites/sips and alternating solids/liquids. Pt with adequate carry  over during trials given min cues. x1 thick, heavy sounding cough following completion of swallow of multiple cup sips nectar. No additional s/s penetration/aspiration. Pt provided with x10 ice chips to target effortful swallows. Pt completed with adequate effort x10. Recommend continue current diet level with use of strategies. Cont.   6/30: Patient seen up in recliner on room air, eating breakfast items (nectar thick via cup, yogurt), and seen with whole  meds in puree (administered by RN). Voice remains aphonic. Pt tolerated puree well with no overt s/s aspiration, however pt consistently demonstrating reactive cough with nectar via cup (of note was productive of sputum x1). Given consistent cough with specifically nectar thick liquids, recommend repeat MBS to check for possible change in swallow function.  7/1: Patient seated upright, edge of bed, on supplemental O2 via nc. Covid test still pending. Continues to have cough at baseline, prior to PO intake. As per repeat MBS results yesterday, pt now on Honey thick liquids, as she was found to be aspirating nectar thick liquids yesterday. This date, assessed tolerance honey thick and soft bite-sized. Pt continues to demonstrate improved tolerance of honey thick liquids, and improved tolerance of advanced solid (soft and bite sized), with positive oral acceptance, improved timeliness of oral prep, positive swallow movement, good oral clearance, no overt signs of aspiration or penetration. Advance back to soft and bite-sized, continue honey thick liquids.  Cont goal    Patient/Family/Caregiver Education:  See goal 2 above    Compensatory Strategies:  Compensatory Swallowing Strategies : Eat/Feed slowly,Upright as possible for all oral intake,Alternate solids and liquids,Effortful swallow,Small bites/sips      Plan:  Continue dysphagia treatment with goals per plan of care.  Diet recommendations:     - advance to Dysphagia Soft & Bite-Sized (from Minced [employer] Moist)  - continue Honey thick liquids      DC recommendation: TBD  Treatment: 10  D/W nursing: Lila; Dr. Caprice Red  Needs met prior to leaving room, call button in reach.    Electronically signed by:  Gust Brooms, [place], [place], [id number]  Pg. # [phone]      If patient is discharged prior to next treatment, this note will serve as the discharge summary

## 2021-02-19 NOTE — Progress Notes (Signed)
Occupational Therapy  Facility/Department: [facility] [address] [place]  Occupational Therapy Treatment Note     Name: Molly [name]  DOB: 1960/10/18  MRN: [id number]  Date of Service: 02/19/2021    Discharge Recommendations:Molly [name] scored a 13/24 on the AM-PAC ADL Inpatient form. Current research shows that an AM-PAC score of 17 or less is typically not associated with a discharge to the patient's home setting. Based on the patient's AM-PAC score and their current ADL deficits, it is recommended that the patient have 5-7 sessions per week of Occupational Therapy at d/c to increase the patient's independence.  At this time, this patient demonstrates complex nursing, medical, and rehabilitative needs, and would benefit from intensive rehabilitation services upon discharge from the Inpatient setting.  This patient demonstrates the ability to participate in and benefit from an intensive therapy program with a coordinated interdisciplinary team approach to foster frequent, structured, and documented communication among disciplines, who will work together to establish, prioritize, and achieve treatment goals. Please see assessment section for further patient specific details.    If patient discharges prior to next session this note will serve as a discharge summary.  Please see below for the latest assessment towards goals.        OT Equipment Recommendations  Equipment Needed: No  Other: defer to next care facility       Patient Diagnosis(es): The primary encounter diagnosis was Shortness of breath. Diagnoses of Congestive heart failure, unspecified HF chronicity, unspecified heart failure type ([place]), Acute on chronic combined systolic and diastolic congestive heart failure ([place]), CHF (congestive heart failure), NYHA class I, acute on chronic, combined ([place]), Atrial flutter with rapid ventricular response ([place]), Dilated cardiomyopathy ([place]), and Acute respiratory failure with hypoxia ([place]) were also pertinent to this  visit.  Past Medical History:  has a past medical history of Amenorrhea, Cancer ([place]), Chronic combined systolic and diastolic CHF, NYHA class 2 ([place]), History of non-Hodgkin's lymphoma, and Systemic lupus.  Past Surgical History:  has a past surgical history that includes LEEP; knee surgery (Left); Knee Arthroplasty (Left, 10/25/2016); and joint replacement.    Treatment Diagnosis: impaired ADLs / functional transfers / decreased BUE functional use and impaired mentation 2/2 CHF      Assessment   Performance deficits / Impairments: Decreased functional mobility ;Decreased endurance;Decreased coordination;Decreased ADL status;Decreased strength;Decreased ROM;Decreased safe awareness;Decreased cognition;Decreased fine motor control;Decreased high-level IADLs;Decreased balance  Assessment: Pt continues with ongoing deficits and significant limitations from independent baseline level. 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   Pt continues with  poor endurance / decreased processing / generalized weakness / deconditioning 2/2 prolonged hospital course.  Pt would benefit from ongoing inpt OT at d/c to maximize functional level. Will follow as inpt  Treatment Diagnosis: impaired ADLs / functional transfers / decreased BUE functional use and impaired mentation 2/2 CHF  REQUIRES OT FOLLOW-UP: Yes  Activity Tolerance  Activity Tolerance: Patient Tolerated treatment well;Patient limited by fatigue  Activity Tolerance Comments: Pt's o2 sat on RA 92-95% throughout session. Pt's BP difficult to obtain several readings in 80/50's- RN aware. Pt alert and awake during activity        Plan   Plan  Times per Week: 2-5x  Times per Day: Daily  Current Treatment Recommendations: Endurance training,Patient/Caregiver education & training,Safety education & training,Self-Care / ADL,Strengthening,Cognitive reorientation,Coordination training,Equipment evaluation, education, & procurement,Functional mobility training      Restrictions  Position Activity Restriction  Other position/activity restrictions: up as tol prn  Subjective   General  Chart Reviewed: Yes  Additional Pertinent Hx: Admit 6/21 with SOB and CHF. 6/27 CXray - Mild left basilar airspace disease. Pt intubated in ICU 2/2 atrial flutter ablation 6/24-6/27.      PMHX: Amenorrhea, Cancer ([place]), History of non-Hodgkin's lymphoma, Obesity, and Systemic lupus.She has a past surgical history that includes LEEP; knee surgery (Left); Knee Arthroplasty (Left, 10/25/2016); and joint replacement.  Family / Caregiver Present: No  Diagnosis: SOB  Subjective  Subjective: " I feel okay" pt found resting in bed - agreeable for OOB/OT tx.  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 education.    Safety Devices  Type of Devices: Left in chair;Chair alarm in place;Nurse notified;Call light within reach  Restraints  Restraints Initially in Place: No     Toilet Transfers  Toilet - Technique: Ambulating  Equipment Used: Extra wide bedside commode  Toilet Transfer: Minimal assistance;2 Person assistance;Dependent/Total  Toilet Transfers Comments: simulated in recliner /chair -- mod v.cues for hand placement /tech  AROM: Generally decreased, functional  Strength: Generally decreased,  functional (increased functional use -- remains far from baseline level)  Coordination: Generally decreased, functional  Tone: Normal  Sensation: Intact  ADL  Feeding: Contact guard assistance;Verbal cueing;Increased time to complete;Setup;Minimal assistance  Feeding Skilled Clinical Factors: increased BUE functional use - remains impaired / weak  Grooming: Minimal assistance;Increased time to complete;Verbal cueing;Setup  Toileting: Maximum assistance;Dependent/Total  Toileting Skilled Clinical Factors: currently with foley / briefs     Activity Tolerance  Activity Tolerance: Patient limited by fatigue;Patient limited by endurance  Activity Tolerance Comments: low BP with OOB:  72/54 in chair (pt converse, c/o slight dizziness).  Feet elevated.  Multiple repeated BPs taken- all around same number but machine not seeming accurate.  RN called & PT requested RN take manual BP.  RN felt it was the machine acting up & states ok for pt to stay in chair.  Bed mobility  Supine to Sit: Contact guard assistance;Minimal assistance (HOB at 40 and rails.  increased time / effort)  Scooting: Minimal assistance (with scooting into / out of chair)  Transfers  Sit to stand: Minimal assistance  Stand to sit: Minimal assistance  Transfer Comments: from raised bed. Pt unable to stand from std height bed.     Cognition  Overall Cognitive Status: Exceptions  Arousal/Alertness: Delayed responses to stimuli  Following Commands: Follows one step commands with increased time;Follows one step commands with repetition  Attention Span: Difficulty attending to directions  Memory: Decreased recall of recent events;Decreased short term memory  Safety Judgement: Decreased awareness of need for safety;Decreased awareness of need for assistance  Insights: Decreased awareness of deficits  Initiation: Requires cues for some  Sequencing: Requires  cues for some  Cognition Comment: Improved alertness - needing increased time /cues. Remains significantly  impaired from baseline.  Orientation  Overall Orientation Status: Impaired  Orientation Level: Disoriented to time;Oriented to person     Education Given To: Patient;Family  Education Provided: Role of Therapy;Plan of Museum/gallery exhibitions officer;Fall Prevention Strategies;IADL Safety  Barriers to Learning: Cognition  Education Outcome: Continued education needed    AM-PAC Score  AM-PAC Inpatient Daily Activity Raw Score: 13 (02/19/21 1459)  AM-PAC Inpatient ADL T-Scale Score : 32.03 (02/19/21 1459)  ADL Inpatient CMS 0-100% Score: 63.03 (02/19/21 1459)  ADL Inpatient CMS G-Code Modifier : CL (02/19/21 1459)    Goals  Short Term Goals  Time Frame for Short term goals: at d/c  Short Term Goal 1: Stance x 5 mins with CGA for ADLs /IADLs - not met  Short Term Goal 2: Grooming with setup and <3 v.cues for completion - not met  Short Term Goal 3: Commode transfers with Min A -RTS prn - not met  Short Term Goal 4: Feed self with setup- part met  Short Term Goal 5: Oriented to environment 4/5 attempts.- not met  Patient Goals   Patient goals : unable to state     Therapy Time   Individual Concurrent Group Co-treatment   Time In 1354         Time Out 1434         Minutes 40              Timed Code Treatment Minutes:   40 mins     Total Treatment Minutes:  40 mins       Loralyn [place], OT

## 2021-02-19 NOTE — Progress Notes (Signed)
Physical Therapy  Facility/Department: [facility], [place] 6 SOUTH TELEMETRY  Physical Therapy Treatment    Name: Molly [name]  DOB: 03-Aug-1961  MRN: [id number]  Date of Service: 02/19/2021    Discharge Recommendations: Molly [name] scored a 13/24 on the AM-PAC short mobility form. Current research shows that an AM-PAC score of 17 or less is typically not associated with a discharge to the patient's home setting. Based on the patient's AM-PAC score and their current functional mobility deficits, it is recommended that the patient have 5-7 sessions per week of Physical Therapy at d/c to increase the patient's independence.  At this time, this patient demonstrates complex nursing, medical, and rehabilitative needs, and would benefit from intensive rehabilitation services upon discharge from the Inpatient setting.  This patient demonstrates the ability to participate in and benefit from an intensive therapy program with a coordinated interdisciplinary team approach to foster frequent, structured, and documented communication among disciplines, who will work together to establish, prioritize, and achieve treatment goals. Please see assessment section for further patient specific details.    If patient discharges prior to next session this note will serve as a discharge summary.  Please see below for the latest assessment towards goals.         PT Equipment Recommendations  Equipment Needed:  (defer)      Patient Diagnosis(es): The primary encounter diagnosis was Shortness of breath. Diagnoses of Congestive heart failure, unspecified HF chronicity, unspecified heart failure type (HCC), Acute on chronic combined systolic and diastolic congestive heart failure (HCC), CHF (congestive heart failure), NYHA class I, acute on chronic, combined (HCC), Atrial flutter with rapid ventricular response (HCC), Dilated cardiomyopathy (HCC), and Acute respiratory failure with hypoxia (HCC) were also pertinent to this visit.  Past Medical History:  has  a past medical history of Amenorrhea, Cancer (HCC), Chronic combined systolic and diastolic CHF, NYHA class 2 (HCC), History of non-Hodgkin's lymphoma, and Systemic lupus.  Past Surgical History:  has a past surgical history that includes LEEP; knee surgery (Left); Knee Arthroplasty (Left, 10/25/2016); and joint replacement.    Assessment   Body Structures, Functions, Activity Limitations Requiring Skilled Therapeutic Intervention: Decreased functional mobility ;Decreased strength;Decreased safe awareness;Decreased cognition;Decreased endurance;Decreased balance  Assessment: Pt more alert today but cognition still appears impaired & responds slowly to commands. Slightly less A required for mobility today.  Low BP with OOB (questionable reading) & could not safely do any further ambulation.  RN aware of low BP & states pt ok to remain in chair.  Recommend ongoing IP PT at dc- feel pt could tolerate 3 hours a day.  Requires PT Follow-Up: Yes  Activity Tolerance  Activity Tolerance: Patient limited by fatigue;Patient limited by endurance  Activity Tolerance Comments: low BP with OOB:  72/54 in chair (pt conversive, c/o slight dizziness).  Feet elevated.  Multiple repeated BPs taken- all around same number but machine not seeming accurate.  RN called & PT requested RN take manual BP.  RN felt it was the machine acting up & states ok for pt to stay in chair.     Plan   Plan  Plan:  (2-5)  Current Treatment Recommendations: Strengthening,ROM,Balance training,Functional mobility training,Transfer training,Gait training,Patient/Caregiver education & training  Safety Devices  Type of Devices: Left in chair,Chair alarm in place,Nurse notified,Call light within reach  Restraints  Restraints Initially in Place: No     Restrictions  Position Activity Restriction  Other position/activity restrictions: up as tol prn     Subjective   General  Chart Reviewed: Yes  Additional Pertinent Hx: Adm 6/21- direct adm from PCP office for  SOB, LE edema, cough.  Pt has fairly new diagnosis of CHF with EF of 20%.  CT chest (-) for PE.  Hx of NHL, SLE,  RA.  Rapid response 6/22 due to difficulty breathing.  Cath lab 6/24 for ablation, had to be intubated due to resp distress & sent to ICU afterwards.  Self-extubated 6/27.  Family / Caregiver Present: Yes (sister-in-law)  Referring Practitioner: Oneal Grout, MD  Diagnosis: SOB  Follows Commands: Within Functional Limits  Subjective  Subjective: Found in bed, agreeable to therapy.  Doesn't fully remember therapy yesterday.                   Objective                            Bed mobility  Supine to Sit: Contact guard assistance;Minimal assistance (HOB elev, use of rail & moderate verbal cues; effortful & took extra time)  Scooting: Minimal assistance (to scoot hips towards EOB)  Transfers  Sit to Stand: 2 Person Assistance (MIN A of 2 from elevated bed height)  Stand to sit: Minimal Assistance (cues to reach back)  Ambulation  Surface: level tile  Device: Rolling Walker  Assistance: 2 Person assistance (MIN A of 2)  Quality of Gait: weak appearing gait, slow cadence; cues for stepping towards chair & occasional A with walker  Distance: 5 small steps bed>chair  Comments: No further gait due to lower BP.     Balance  Sitting - Static: Good (EOB sitting SBA)  A/AROM Exercises: 10 reps B APs, 3 reps HS, 5 reps LAQ indep B.                                                      AM-PAC Score  AM-PAC Inpatient Mobility Raw Score : 13 (02/19/21 1449)  AM-PAC Inpatient T-Scale Score : 36.74 (02/19/21 1449)  Mobility Inpatient CMS 0-100% Score: 64.91 (02/19/21 1449)  Mobility Inpatient CMS G-Code Modifier : CL (02/19/21 1449)          Goals- ongoing today  Short Term Goals  Time Frame for Short term goals: dc  Short term goal 1: Bed Mobility SBA.  Short term goal 2: Sit<>stand CGA.  Short term goal 3: Ambulate 76' with RW CGA.  Short term goal 4: Demo independence with LE HEP x 10 reps.  Patient Goals   Patient  goals : wants to get stronger and go home       Education  Patient Education  Education Given To: Patient  Education Provided: Role of Therapy;Plan of Care  Education Provided Comments: use of call light; not to get up without A  Education Method: Verbal  Education Outcome: Verbalized understanding;Continued education needed      Therapy Time   Individual Concurrent Group Co-treatment   Time In 1400         Time Out 1438         Minutes 38           Timed Code Treatment Minutes:   38    Total Treatment Minutes:  [address] [place] [address]

## 2021-02-19 NOTE — Progress Notes (Signed)
CC: AF/AFL  HPI:   Patient seen and examined and notes reviewed. Patient is being followed for AF/AFL.  Patient had presented with progressively worsening shortness of breath ongoing for the past 6 weeks.  She also noted a 40 pound weight gain.  She ended up seeing her PCP who ordered an echo that showed an EF of less than 20%.  She was then referred to the ED for further evaluation due to ongoing symptoms.  She was noted to be in atrial flutter with a poorly controlled ventricular rate.  She was started on aggressive IV diuresis.  She had gone to the EP lab on 02/12/2021 and was noted to have a drop in her saturation.  Anesthesia recommended elective intubation for the procedure.  Post procedure she was unable to be extubated and was transferred to ICU.  CXR showed increased bronchopulmonary markings prominent upper lobe veins.  She was restarted on her Lasix drip.  She was placed on a milrinone drip however her blood pressure remained low and this was discontinued over the weekend.  She was placed on pressors for her BP, now off.  She developed AF and was placed on an amiodarone drip. Extubated 6/27. Somnolent today. Changed to amio po yesterday. In NSR with short runs of AT overnight.       S: lethargic     Tele: Sinus 91    O:  Physical Exam:  BP (!) 91/58    Pulse 91    Temp 97.4 ??F (36.3 ??C) (Oral)    Resp 18    Ht 5\' 11"  (1.803 m)    Wt 290 lb 5.5 oz (131.7 kg)    SpO2 92%    BMI 40.49 kg/m??    General (appearance):  lethargic  Eyes: anicteric   Neck: soft, No JVD  Ears/Nose/Mouth/Thorat: No cyanosis  CV: RRR   Respiratory:  Normal effort  GI: soft, non-tender, non-distended  Skin: Warm, dry. No rashes  Neuro/Psych: lethargic  Ext:  No c/c. No edema  Pulses:      I.O's= -17 L     Weight  Admission: Weight: (!) 315 lb (142.9 kg)   Today: Weight: 290 lb 5.5 oz (131.7 kg)    CBC:   Recent Labs     02/17/21  0349 02/18/21  0658 02/19/21  0640   WBC 13.8* 12.6* 13.4*   HGB 15.7 16.4* 16.2*   HCT 47.5 50.1* 49.0*    MCV 89.6 89.1 88.6   PLT 239 288 346     BMP:   Recent Labs     02/18/21  0700 02/18/21  1615 02/19/21  0640   NA 137 136 133*   K 4.0 3.8 3.8   CL 89* 88* 86*   CO2 30 29 30    BUN 36* 42* 51*   CREATININE 1.4* 1.6* 2.4*     Mag:   Lab Results   Component Value Date/Time    MG 2.40 02/19/2021 06:40 AM     Pro-BNP:   Lab Results   Component Value Date/Time    PROBNP 1,857 02/15/2021 04:22 AM    PROBNP 6,744 02/12/2021 05:34 AM    PROBNP 4,707 02/09/2021 06:40 PM         Imaging:    02/09/2021 CTA PE     1. No evidence of any pulmonary embolism.   2. Four-chamber cardiac enlargement with reflux of contrast into the hepatic veins. This can be seen with elevated right heart pressures.   3.  No acute pulmonary process.     02/15/2021 CXR     Endotracheal tube removed. Right IJ central venous catheter tip in the distal SVC. Cardiomegaly is stable. Mild left basilar airspace disease. No significant pleural effusion. No pneumothorax.     02/05/2021 Echo     Left ventricular cavity size is severely dilated.   Normal left ventricular wall thickness.   Ejection fraction is visually estimated to be <20%.   There is severe diffuse hypokinesis.   Global Longitiudinal Strain= -5.0%   Grade III diastolic dysfunction with elevated LV filling pressures.   Moderate thickening of leaflets of mitral valve.   moderate mitral regurgitation.   Mildly dilated right ventricle.   Right ventricular systolic function is severely reduced.   The tricuspid valve was not well visualized.   moderate tricuspid regurgitation.   The right atrium is moderately dilated.   IVC size is dilated (>2.1 cm) and collapses < 50% with respiration   consistent with elevated RA pressure (15 mmHg).   Estimated pulmonary artery systolic pressure is mildly elevated at 40 mmHg   assuming a right atrial pressure of 15 mmHg.      Assessment:  60 y.o. with sob, cohg and edema   AF/AFL RVR  Acute HFrEF EF 20% grade III-DD  AKI    Plan:  -Keep K>4,  Mg>2.  -Amio  -Eliquis  -Digoxin 125 mcg daily   -Toprol 50   -Entresto on hold s/t soft BP and AKI  -spironolactone on hold d/t soft BP and AKI     Dr Harrison Mons managing CHF

## 2021-02-19 NOTE — Discharge Instructions (Addendum)
Continuity of Care Form    Patient Name: Molly [name]   DOB:  30-Jul-1961  MRN:  [id number]    Admit date:  02/09/2021  Discharge date:  ***    Code Status Order: Full Code   Advance Directives:      Admitting Physician:  Waymond Cera, MD  PCP: Lindell Spar, APRN - CNP    Discharging Nurse: [name] Unit/Room#: 6302/6302-01  Discharging Unit Phone Number: ***    Emergency Contact:   Extended Emergency Contact Information  Primary Emergency Contact: Dunsieth,Neal  Address: [address]           [place], [place] [address] [place]  Home Phone: [phone]  Mobile Phone: [phone]  Relation: Spouse  Secondary Emergency Contact: Theodosia Quay RN  Home Phone: [phone]  Relation: Other Relative    Past Surgical History:  Past Surgical History:   Procedure Laterality Date    JOINT REPLACEMENT      KNEE ARTHROPLASTY Left 10/25/2016    left total knee replacement using computer navigation ([employer])    KNEE SURGERY Left     ACL and meniscus repair    LEEP      in 1990's       Immunization History:   Immunization History   Administered Date(s) Administered    COVID-19, PFIZER PURPLE top, DILUTE for use, (age 47 y+), 82mcg/0.3mL 12/17/2019, 01/07/2020, 08/01/2020    Tdap (Boostrix, Adacel) 02/05/2013       Active Problems:  Patient Active Problem List   Diagnosis Code    History of non-Hodgkin's lymphoma Z85.72    Impaired glucose tolerance R73.02    HLD (hyperlipidemia) E78.5    Vitamin D deficiency E55.9    Morbid obesity with BMI of 40.0-44.9, adult (HCC) E66.01, Z68.41    Systemic lupus erythematosus (HCC) M32.9    High risk medication use Z79.899    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    Seasonal allergies J30.2    Acute bacterial sinusitis J01.90, B96.89    Shortness of breath R06.02    Acute on chronic congestive heart failure (HCC) I50.9    CHF (congestive heart failure), NYHA class I, acute on chronic, combined (HCC) I50.43    Atrial flutter with  rapid ventricular response (HCC) I48.92    Dilated cardiomyopathy (HCC) I42.0    Acute respiratory failure with hypoxia (HCC) J96.01    Lactic acidosis E87.2    Paroxysmal atrial fibrillation (HCC) I48.0       Isolation/Infection:   Isolation            Droplet Plus          Patient Infection Status       Infection Onset Added Last Indicated Last Indicated By Review Planned Expiration Resolved Resolved By    COVID-19 (Rule Out) 02/18/21 02/18/21 02/18/21 COVID-19 (Ordered) 02/26/21 03/04/21      Resolved    COVID-19 (Rule Out) 01/29/21 01/29/21 01/29/21 COVID-19 (Ordered)   01/31/21 Rule-Out Test Resulted            Nurse Assessment:  Last Vital Signs: BP (!) 91/58    Pulse 91    Temp 97.4 ??F (36.3 ??C) (Oral)    Resp 18    Ht 5\' 11"  (1.803 m)    Wt 290 lb 5.5 oz (131.7 kg)    SpO2 92%    BMI 40.49 kg/m??     Last documented pain score (0-10 scale): Pain Level: 0  Last Weight:   Wt Readings from Last 1 Encounters:   02/19/21 290 lb 5.5 oz (131.7 kg)       CASE MANAGEMENT/SOCIAL WORK SECTION    Inpatient Status Date: ***    Readmission Risk Assessment Score:  Readmission Risk              Risk of Unplanned Readmission:  22           Discharging to Facility/ Agency   Name:   Address:  Phone:  Fax:    Dialysis Facility (if applicable)   Name:  Address:  Dialysis Schedule:  Phone:  Fax:    Case Manager/Social Worker signature: {Esignature:304088025}    Heart Failure Instructions for Daily Management  Patient was treated for acute on chronic combined systolic and diastolic heart failure.  she  will require the following:    Please weigh daily on the same scale and approximately the same time of day.  Report weight gain of 3 pounds/day or 5 pounds/week to : Lindell Spar, APRN - CNP [phone] and [facility] [place] [phone].  Please use hospital discharge weight as baseline reference.   Please monitor for signs and symptoms of and report to MD:  Worsening Heart Failure: sudden weight  gain, shortness of breath, lower extremity or general edema/swelling, abdominal bloating/swelling, inability to lie flat, intolerance to usual activity, or cough (especially at night). Report these finding even if no increase in weight.  Dehydration:  having difficulty or a decrease in urination, dizziness, worsening fatigue, or new onset/worsening of generalized weakness.     Please continue a LOW SODIUM diet and LIMIT fluid intake to 48 - 64 ounces ( 1.5 - 2 liters) per day.     Call Lindell Spar, APRN - CNP [phone] and [facility] [place] [phone] with any questions or concerns.   Please continue heart failure education to patient and family/support system.  See After Visit Summary for hospital follow up appointment details.  Consider spiritual care referral for support and/or completion of advance directives .  Consider: [facility] telehealth program if patient agreeable and able to participate and palliative care consult for ongoing goals of care, end of life, and/or chronic disease management discussions.  Patient's primary cardiologist is Dr Harrison Mons.  Please draw BMP on 7/11 or 7/12 and fax results to [facility] so they can be reviewed at cardiology follow up on 7/13.       PHYSICIAN SECTION    Prognosis: {Prognosis:9380530915}    Condition at Discharge: {MH Patient Condition:304088024}    Rehab Potential (if transferring to Rehab): {Prognosis:9380530915}    Recommended Labs or Other Treatments After Discharge: ***    Physician Certification: I certify the above information and transfer of Shital A Cortopassi  is necessary for the continuing treatment of the diagnosis listed and that she requires {Admit to Appropriate Level of Care:20763} for {GREATER/LESS:304500278} 30 days.     Update Admission H&P: {CHP DME Changes in MWUXL:244010272}    PHYSICIAN SIGNATURE:  {Esignature:304088025}

## 2021-02-19 NOTE — Progress Notes (Signed)
Hospitalist Progress Note      PCP: Lindell Spar, APRN - CNP    Date of Admission: 02/09/2021    Chief Complaint: Shortness of breath     Hospital Course: see H&P     Subjective: Patient seems better than yesterday.  Sitting at the edge of the bed.  Denies chest pain or shortness of breath.  Denies any other complaints.    Patient's sister-in-law come(Jamayia) was visiting and had a lot of questions regarding patient care.  Discussed patient condition and answered questions at length.       Medications:  Reviewed    Infusion Medications   ??? sodium chloride     ??? dextrose       Scheduled Medications   ??? spironolactone  25 mg Oral Daily   ??? sacubitril-valsartan  1 tablet Oral BID   ??? furosemide  40 mg Oral BID   ??? metoprolol succinate  50 mg Oral Daily   ??? QUEtiapine  12.5 mg Oral Nightly   ??? digoxin  125 mcg Oral Daily   ??? amiodarone  200 mg Oral BID   ??? [START ON 03/03/2021] amiodarone  200 mg Oral Daily   ??? melatonin  5 mg Oral Nightly   ??? sodium chloride flush  5-40 mL IntraVENous 2 times per day   ??? apixaban  5 mg Oral BID   ??? DULoxetine  60 mg Oral Daily   ??? [Held by provider] hydroxychloroquine  400 mg Oral Daily     PRN Meds: diphenhydrAMINE, diphenhydrAMINE, ipratropium-albuterol, sodium chloride (Inhalant), heparin (porcine), heparin (porcine), phenol, sodium chloride flush, sodium chloride, acetaminophen, glucose, dextrose bolus **OR** dextrose bolus, glucagon (rDNA), dextrose, hydrOXYzine HCl, albuterol, ondansetron **OR** ondansetron, polyethylene glycol      Intake/Output Summary (Last 24 hours) at 02/19/2021 0813  Last data filed at 02/19/2021 0331  Gross per 24 hour   Intake 953 ml   Output 925 ml   Net 28 ml       Physical Exam Performed:    BP (!) 93/55    Pulse 90    Temp 98.1 ??F (36.7 ??C) (Axillary)    Resp 18    Ht 5\' 11"  (1.803 m)    Wt 290 lb 5.5 oz (131.7 kg)    SpO2 94%    BMI 40.49 kg/m??     General appearance: No apparent distress  HEENT: Pupils equal, round, and reactive to light.  Conjunctivae/corneas clear.  Neck: Supple, with full range of motion. No jugular venous distention. Trachea midline.  Respiratory:  Normal respiratory effort. Clear to auscultation, bilaterally without Rales/Wheezes/Rhonchi.  Cardiovascular: Heart rate normal with normal rhythm with normal S1/S2 without murmurs, rubs or gallops.  Abdomen: Soft, non-tender, non-distended with normal bowel sounds.  Musculoskeletal: Bilateral lower extremity edema improving  Skin: Skin color, texture, turgor normal.  No rashes or lesions.  Neurologic:  Grossly non-focal.  Capillary Refill: Brisk,3 seconds, normal   Peripheral Pulses: +2 palpable, equal bilaterally       Labs:   Recent Labs     02/17/21  0349 02/18/21  0658 02/19/21  0640   WBC 13.8* 12.6* 13.4*   HGB 15.7 16.4* 16.2*   HCT 47.5 50.1* 49.0*   PLT 239 288 346     Recent Labs     02/18/21  0700 02/18/21  1615 02/19/21  0640   NA 137 136 133*   K 4.0 3.8 3.8   CL 89* 88* 86*   CO2 30 29  30   BUN 36* 42* 51*   CREATININE 1.4* 1.6* 2.4*   CALCIUM 9.7 9.9 9.8     No results for input(s): AST, ALT, BILIDIR, BILITOT, ALKPHOS in the last 72 hours.  No results for input(s): INR in the last 72 hours.  No results for input(s): CKTOTAL, TROPONINI in the last 72 hours.    Urinalysis:      Lab Results   Component Value Date/Time    NITRU Negative 02/09/2021 07:13 PM    WBCUA 3-5 02/09/2021 07:13 PM    BACTERIA RARE 04/19/2017 11:01 AM    RBCUA 3-4 02/09/2021 07:13 PM    BLOODU Negative 02/09/2021 07:13 PM    SPECGRAV 1.015 02/09/2021 07:13 PM    GLUCOSEU Negative 02/09/2021 07:13 PM       Radiology:  Marylene Buerger MODIFIED BARIUM SWALLOW W VIDEO   Final Result      Aspiration with thin and nectar thick liquids. Please see separate same-day dictation for more dedicated speech pathology findings.            FL MODIFIED BARIUM SWALLOW W VIDEO   Final Result      Aspiration with thin liquids      Please see speech pathology assessment for dietary recommendations.      XR CHEST PORTABLE   Final  Result      Mild left basilar airspace disease.      XR CHEST PORTABLE   Final Result      1. Endotracheal tube in stable position. Right IJ central venous catheter placed with the tip in the distal SVC in satisfactory position   2. Stable cardiac enlargement with no acute segmental consolidation               XR CHEST PORTABLE   Final Result   Impression:       Tip of endotracheal tube is 5 cm the carina.      Lungs are clear. No pneumothorax.      CT CHEST PULMONARY EMBOLISM W CONTRAST   Final Result      1. No evidence of any pulmonary embolism.   2. Four-chamber cardiac enlargement with reflux of contrast into the hepatic veins. This can be seen with elevated right heart pressures.   3. No acute pulmonary process.      XR CHEST PORTABLE   Final Result      Stable cardiac enlargement      No acute consolidation                Assessment/Plan:    Active Hospital Problems    Diagnosis    ??? Paroxysmal atrial fibrillation (HCC) [I48.0]      Priority: Medium   ??? Atrial flutter with rapid ventricular response (HCC) [I48.92]      Priority: Medium   ??? Dilated cardiomyopathy (HCC) [I42.0]      Priority: Medium   ??? Acute respiratory failure with hypoxia (HCC) [J96.01]      Priority: Medium   ??? Lactic acidosis [E87.2]      Priority: Medium   ??? CHF (congestive heart failure), NYHA class I, acute on chronic, combined (HCC) [I50.43]      Priority: Medium   ??? Shortness of breath [R06.02]      Priority: Medium     60 yo female with a PMH of NHL, SLE who presented to the ED with chief complaints of cough and shortness of breath for the past 6 weeks that got worse recently  in the past few days  ??  Acute Exacerbation of New-Onset HFrEF   Cardiogenic shock  Recent ECHO on 6/17 with severe dilation of LV, EF of <20%, severe diffuse hypokinesis and grade 3 diastolic dysfunction.  - Off Milrinone since 6/25. Off Levophed  - IV lasix was transitioned to PO but held today d/t elevated creatinine  - Enteresto and aldactone also held d/t  elevated creatinine  - Requires ischemic w/u, consider when euvolemic??  - Cards following - appreciate recs    Acute hypoxic respiratory failure 2/2 acute HFrEF s/p a flutter ablation (6/24)  - S/p extubation 02/15/2021  - Off sedation  - Off oxygen  - Resolved  ??  New-Onset Atrial Flutter??s/p ablation??(6/24)??  -??S/p ablation w/ EP 6/24  - Status post heparin drip.  Transition to Eliquis twice daily 06/28  - Received a dose of digoxin 125 mcgX1 and lopressor resumed 06/28  - Cont Po digoxin. Lopressor changed to toprol  - Off Amiodarone gtt and on Po amio now  - Cardiology following, appreciate recommendation    AKI  - Likely 2/2 hypoperfusion due to cardiogenic shock  - Avoid nephrotoxic medications  - Monitor creatinine, creatinine 2.4 today  - Holding lasix, aldactone and enteresto  - Gentle IVF hydration started    Insomnia  - Patient has been unable to sleep even after doses of melatonin and Ambien  - Seroquel 25 mg nightly was started, reduced to 12.5 mg nightly  - Cont seroquel 12.5 mg nightly    Anxiety  - Continue cymbalta 60 mg PO daily     SLE   - Holding Plaquenil 400 mg PO daily    OA   - Holding Relafen    COVID rule out  - Has been tested positive in 06/29 and has been visiting the patient  - COVID test ordered, pending  - Droplet plus isolation    Morbid obesity BMI 40, complicates overall care    Non-Hodgkin's lymphoma status postchemo 2006-2007    DVT Prophylaxis: Eliquis  Diet: ADULT DIET; Dysphagia - Minced and Moist; Moderately Thick (Honey)  ADULT ORAL NUTRITION SUPPLEMENT; Lunch, Dinner; Frozen Oral Supplement  Code Status: Full Code    PT/OT Eval Status: Ordered    Dispo -pending hemodynamic stability, AKI improvement, cardiology recs, ARU placement    Oneal Grout, MD

## 2021-02-20 LAB — BASIC METABOLIC PANEL
Anion Gap: 11 (ref 3–16)
Anion Gap: 18 — ABNORMAL HIGH (ref 3–16)
BUN: 56 mg/dL — ABNORMAL HIGH (ref 7–20)
BUN: 63 mg/dL — ABNORMAL HIGH (ref 7–20)
CO2: 25 mmol/L (ref 21–32)
CO2: 28 mmol/L (ref 21–32)
Calcium: 9.1 mg/dL (ref 8.3–10.6)
Calcium: 9.2 mg/dL (ref 8.3–10.6)
Chloride: 85 mmol/L — ABNORMAL LOW (ref 99–110)
Chloride: 85 mmol/L — ABNORMAL LOW (ref 99–110)
Creatinine: 2.1 mg/dL — ABNORMAL HIGH (ref 0.6–1.1)
Creatinine: 2.8 mg/dL — ABNORMAL HIGH (ref 0.6–1.1)
GFR African American: 21 — AB (ref >60)
GFR African American: 29 — AB (ref >60)
GFR Non-African American: 17 — AB (ref >60)
GFR Non-African American: 24 — AB (ref >60)
Glucose: 119 mg/dL — ABNORMAL HIGH (ref 70–99)
Glucose: 97 mg/dL (ref 70–99)
Potassium: 3.8 mmol/L (ref 3.5–5.1)
Potassium: 4 mmol/L (ref 3.5–5.1)
Sodium: 124 mmol/L — ABNORMAL LOW (ref 136–145)
Sodium: 128 mmol/L — ABNORMAL LOW (ref 136–145)

## 2021-02-20 LAB — CBC WITH AUTO DIFFERENTIAL
Basophils %: 0.3 %
Basophils Absolute: 0 10*3/uL (ref 0.0–0.2)
Eosinophils %: 2.7 %
Eosinophils Absolute: 0.4 10*3/uL (ref 0.0–0.6)
Hematocrit: 46.1 % (ref 36.0–48.0)
Hemoglobin: 15 g/dL (ref 12.0–16.0)
Lymphocytes %: 18.2 %
Lymphocytes Absolute: 2.4 10*3/uL (ref 1.0–5.1)
MCH: 28.7 pg (ref 26.0–34.0)
MCHC: 32.5 g/dL (ref 31.0–36.0)
MCV: 88.4 fL (ref 80.0–100.0)
MPV: 7.8 fL (ref 5.0–10.5)
Monocytes %: 20.6 %
Monocytes Absolute: 2.7 10*3/uL — ABNORMAL HIGH (ref 0.0–1.3)
Neutrophils %: 58.2 %
Neutrophils Absolute: 7.5 10*3/uL (ref 1.7–7.7)
Platelets: 350 10*3/uL (ref 135–450)
RBC: 5.22 M/uL — ABNORMAL HIGH (ref 4.00–5.20)
RDW: 16 % — ABNORMAL HIGH (ref 12.4–15.4)
WBC: 12.9 10*3/uL — ABNORMAL HIGH (ref 4.0–11.0)

## 2021-02-20 LAB — MAGNESIUM: Magnesium: 2.1 mg/dL (ref 1.80–2.40)

## 2021-02-20 LAB — POCT GLUCOSE
POC Glucose: 90 mg/dl (ref 70–99)
POC Glucose: 105 mg/dl — ABNORMAL HIGH (ref 70–99)

## 2021-02-20 LAB — COVID-19: SARS-CoV-2, PCR: DETECTED — AB

## 2021-02-20 MED ORDER — SODIUM CHLORIDE 0.9 % IV SOLN
0.9 % | INTRAVENOUS | Status: AC
Start: 2021-02-20 — End: 2021-02-20
  Administered 2021-02-20: 13:00:00 via INTRAVENOUS

## 2021-02-20 MED ORDER — METOPROLOL SUCCINATE ER 25 MG PO TB24
25 MG | Freq: Every day | ORAL | Status: DC
Start: 2021-02-20 — End: 2021-02-24
  Administered 2021-02-21 – 2021-02-24 (×4): 25 mg via ORAL

## 2021-02-20 MED FILL — SEROQUEL 25 MG PO TABS: 25 mg | ORAL | Qty: 1

## 2021-02-20 MED FILL — ACETAMINOPHEN 325 MG PO TABS: 325 mg | ORAL | Qty: 2

## 2021-02-20 MED FILL — METOPROLOL SUCCINATE ER 50 MG PO TB24: 50 mg | ORAL | Qty: 1

## 2021-02-20 MED FILL — DIGOXIN 125 MCG PO TABS: 125 ug | ORAL | Qty: 1

## 2021-02-20 MED FILL — ELIQUIS 5 MG PO TABS: 5 mg | ORAL | Qty: 1

## 2021-02-20 MED FILL — MELATONIN 5 MG PO TBDP: 5 mg | ORAL | Qty: 1

## 2021-02-20 MED FILL — DULOXETINE HCL 60 MG PO CPEP: 60 mg | ORAL | Qty: 1

## 2021-02-20 MED FILL — AMIODARONE HCL 200 MG PO TABS: 200 mg | ORAL | Qty: 1

## 2021-02-20 NOTE — Progress Notes (Signed)
Cardiology Consult Service  Daily Progress Note        Admit Date:  02/09/2021  Primary cardiologist: Dr Harrison Mons / Dr. Clarise Cruz    Reason for Consultation/Chief Complaint: AHF, PAFRVR    Subjective:     Molly [name] is a 60 y.o. female with a past medical history of morbid obesity (BMI 43), non-Hodgkin's lymphoma status postchemotherapy 2006- 2007, SLE, RA.  Patient denies any previous cardiac history.  Patient reports she had MUGA test prior to chemotherapy and during treatment, last MUGA test was 2 years after her last chemotherapy and cardiac function was reportedly normal.  ??  Patient presented to the emergency room on 6/21 with progressively worse shortness of breath, lower extremity edema, weight gain, fatigue over the last 6 weeks.  She admits to occasional fluttering in her chest which is very mild.  At the emergency room she was afebrile, tachycardic in AF RVR/atrial flutter RVR, hypertensive, did not require any supplemental oxygen.  Electrolytes normal, proBNP 4700, troponin x1 negative, CBC normal, TSH normal, chest x-ray unremarkable, CTA chest negative for PE, significant biventricular dilatation consistent with cardiomyopathy.  ??  ECG 02/09/2021: Atrial flutter/coarse A. fib with RVR 138 bpm.  ??  Patient was admitted for acute heart failure and was placed on Lasix drip 10 mg an hour after receiving Lasix 60 mg IV x1.  Today I's and O's -3 L, weight down 2 pounds, creatinine stable 1.9, K3.4, magnesium 1.9.  Telemetry was personally reviewed, reveals A. fib with RVR 110-120 bpm.  ??  Echo 02/05/2021: LVD 6.8 cm, LVEF less than 20%, diastolic grade 3, increased LVEDP, severe RV dilatation and dysfunction, moderate MR/TR, RVSP 40 (15).  Images were personally reviewed.  ??  S/p atrial flutter ablation 6/24.  Patient could not be extubated after the procedure due to hypoxia and therefore was transferred intubated to the ICU.    Home diuretics: Lasix 40 mg daily, spironolactone 25 mg daily and  Entresto    Interval history:  Patient reports no complaints.  Yesterday afternoon BP very low, received normal saline 500 mL bolus.  BP continues to remain relatively low at 80/50s.  Patient remains asymptomatic, off supplemental oxygen.  Telemetry shows sinus rhythm 80s.  Labs are pending this morning.    Objective:     Medications:  ??? [START ON 02/21/2021] metoprolol succinate  25 mg Oral Daily   ??? [Held by provider] spironolactone  25 mg Oral Daily   ??? [Held by provider] sacubitril-valsartan  1 tablet Oral BID   ??? [Held by provider] furosemide  40 mg Oral BID   ??? QUEtiapine  12.5 mg Oral Nightly   ??? digoxin  125 mcg Oral Daily   ??? amiodarone  200 mg Oral BID   ??? [START ON 03/03/2021] amiodarone  200 mg Oral Daily   ??? melatonin  5 mg Oral Nightly   ??? sodium chloride flush  5-40 mL IntraVENous 2 times per day   ??? apixaban  5 mg Oral BID   ??? DULoxetine  60 mg Oral Daily   ??? [Held by provider] hydroxychloroquine  400 mg Oral Daily       IV drips:  ??? sodium chloride 100 mL/hr at 02/20/21 0927   ??? sodium chloride     ??? dextrose         PRN:  diphenhydrAMINE, diphenhydrAMINE, ipratropium-albuterol, sodium chloride (Inhalant), heparin (porcine), heparin (porcine), phenol, sodium chloride flush, sodium chloride, acetaminophen, glucose, dextrose bolus **OR** dextrose bolus, glucagon (rDNA),  dextrose, hydrOXYzine HCl, albuterol, ondansetron **OR** ondansetron, polyethylene glycol    Vitals:    02/19/21 2354 02/20/21 0231 02/20/21 0628 02/20/21 0844   BP: (!) 84/59 94/64 (!) 87/50 (!) 96/57   Pulse: 87 88 86 92   Resp: 16  16 17    Temp: 97.2 ??F (36.2 ??C)  97.4 ??F (36.3 ??C) 97.3 ??F (36.3 ??C)   TempSrc: Axillary  Oral Axillary   SpO2: 93% 92% 93% 97%   Weight:   285 lb 11.5 oz (129.6 kg)    Height:           Intake/Output Summary (Last 24 hours) at 02/20/2021 0941  Last data filed at 02/20/2021 04/23/2021  Gross per 24 hour   Intake 490 ml   Output 450 ml   Net 40 ml     I/O last 3 completed shifts:  In: 1205 [P.O.:1205]  Out: 625  [Urine:625]  Wt Readings from Last 3 Encounters:   02/20/21 285 lb 11.5 oz (129.6 kg)   02/01/21 (!) 336 lb (152.4 kg)   01/29/21 (!) 331 lb (150.1 kg)       Admit Wt: Weight: (!) 315 lb (142.9 kg)   Todays Wt: Weight: 285 lb 11.5 oz (129.6 kg)    TELEMETRY personally reviewed:  AF changed to NSR at 12 midnight.     Physical Exam:         General Appearance:  Sedated, intubated.    Head:  Normocephalic, without obvious abnormality, atraumatic   Eyes:  PERRL, conjunctiva/corneas clear EOM intact  Ears normal   Throat no lesions       Nose: Nares normal, no drainage or sinus tenderness   Throat: Lips, mucosa, and tongue normal   Neck: Supple, symmetrical, trachea midline, no adenopathy, thyroid: not enlarged, symmetric, no tenderness/mass/nodules, no carotid bruit.        Lungs:   Normal respiratory rate, lungs with ronchi bilaterally.    Chest Wall:  No tenderness or deformity   Heart:  Regular rhythm, rate is controlled, S1, S2 normal, there is no murmur, there is no rub or gallop, cannot assess jvd, 1+ bilateral lower extremity edema   Abdomen:   Soft, non-tender, bowel sounds active all four quadrants,  no masses, no organomegaly       Extremities: Extremities normal, atraumatic, no cyanosis.    Pulses: 2+ and symmetric   Skin: Skin color, texture, turgor normal, no rashes or lesions   Pysch: Normal mood and affect   Neurologic: Normal gross motor and sensory exam.  Cranial nerves intact       Labs:   Recent Labs     02/17/21  1833 02/18/21  0658 02/18/21  0700 02/18/21  1615 02/19/21  0640 02/20/21  0704   NA   < >  --  137 136 133*  --    K   < >  --  4.0 3.8 3.8  --    BUN   < >  --  36* 42* 51*  --    CREATININE   < >  --  1.4* 1.6* 2.4*  --    CL   < >  --  89* 88* 86*  --    CO2   < >  --  30 29 30   --    GLUCOSE   < >  --  119* 128* 104*  --    CALCIUM   < >  --  9.7 9.9 9.8  --  MG  --  2.00  --   --  2.40 2.10    < > = values in this interval not displayed.     Recent Labs     02/18/21  0658  02/18/21  0658 02/19/21  0640 02/19/21  0640 02/20/21  0704   WBC 12.6*  --  13.4*  --  12.9*   HGB 16.4*  --  16.2*  --  15.0   HCT 50.1*  --  49.0*  --  46.1   PLT 288  --  346  --  350   MCV 89.1   < > 88.6   < > 88.4    < > = values in this interval not displayed.     No results for input(s): CHOLTOT, TRIG, HDL, CHOLHDL, LDL in the last 72 hours.    Invalid input(s): LIPIDCOMM, VLDCHOL  No results for input(s): PTT, INR in the last 72 hours.    Invalid input(s): PT  No results for input(s): CKTOTAL, CKMB, CKMBINDEX, TROPONINI in the last 72 hours.  No results for input(s): BNP in the last 72 hours.  No results for input(s): NTPROBNP in the last 72 hours.  No results for input(s): TSH in the last 72 hours.    Imaging:       Assessment & Plan:     1. ??Acute and new HFrEF. ??It is of unknown etiology, could be ischemic (severe CAD) versus nonischemic (PAF RVR, less likely??postchemotherapy, viral). ??Patient is volume overloaded and hypotensive requiring pressor support.   2. ??Non-Hodgkin's lymphoma status post chemotherapy, now in remission  3. ??SLE  4. ??Rheumatoid arthritis  5. ??PAFL RVR. ??Patient appears to be in atrial flutter/coarse atrial fibrillation with RVR. ??It is of unknown duration and she has not been on any anticoagulation. ??She was minimally symptomatic (minimal palpitations). Patient was started on anticoagulation and EP consulted; patient had AFL ablation on 6/24, remained in sinus post procedure.  6.  SVT 6/26, now in sinus after dig bolus on amio drip.   ??  ??  ??  -  No diuretics today while off supplemental oxygen  -  Will give NS 100 ml/hr x 5 hours only.   -  Cw amio 200 bid x 14 days then daily per EP.   - Will decrease Toprol to 25 daily  - C/w dig 125 mcg daily  - Strict I's and O's every shift and standing weights if possible, low-salt diet, daily BMP with reflex to Mg (correct lytes for goals K >4.0 and Mg > 2.0) and wean supplemental oxygen to off (or down to baseline supplemental oxygen  requirements) for sats greater than 92-94%.  - Cw eliquis 5 bid.   - EP following.         I have spent 35 minutes of face to face time with the patient with more than 50% spent counseling and coordinating care.         I have personally reviewed the reports and images of labs, radiological studies, cardiac studies including ECG's and telemetry, current and old medical records. The note was completed using EMR and Dragon dictation system. Every effort was made to ensure accuracy; however, inadvertent computerized transcription errors may be present.    All questions and concerns were addressed to the patient/family. Alternatives to my treatment were discussed.     Thank you for allowing to [place] to participate in the care or Molly [name]. Please call our service with questions.  Hervey Ard, MD, [facility], [place]  [facility]  [address]  [place] [address]  Ph: [phone]  Fax: [phone]

## 2021-02-20 NOTE — Progress Notes (Signed)
Hospitalist Progress Note      PCP: Lindell Spar, APRN - CNP    Date of Admission: 02/09/2021    Chief Complaint: Shortness of breath     Hospital Course: see H&P     Subjective: Patient states that she is doing better.  Denies chest pain or shortness of breath.  Denies dizziness.    Family (sister, 2 nieces) at bedside.  Discussed patient condition and answered questions.     Medications:  Reviewed    Infusion Medications   ??? sodium chloride     ??? sodium chloride     ??? dextrose       Scheduled Medications   ??? [START ON 02/21/2021] metoprolol succinate  25 mg Oral Daily   ??? [Held by provider] spironolactone  25 mg Oral Daily   ??? [Held by provider] sacubitril-valsartan  1 tablet Oral BID   ??? [Held by provider] furosemide  40 mg Oral BID   ??? QUEtiapine  12.5 mg Oral Nightly   ??? digoxin  125 mcg Oral Daily   ??? amiodarone  200 mg Oral BID   ??? [START ON 03/03/2021] amiodarone  200 mg Oral Daily   ??? melatonin  5 mg Oral Nightly   ??? sodium chloride flush  5-40 mL IntraVENous 2 times per day   ??? apixaban  5 mg Oral BID   ??? DULoxetine  60 mg Oral Daily   ??? [Held by provider] hydroxychloroquine  400 mg Oral Daily     PRN Meds: diphenhydrAMINE, diphenhydrAMINE, ipratropium-albuterol, sodium chloride (Inhalant), heparin (porcine), heparin (porcine), phenol, sodium chloride flush, sodium chloride, acetaminophen, glucose, dextrose bolus **OR** dextrose bolus, glucagon (rDNA), dextrose, hydrOXYzine HCl, albuterol, ondansetron **OR** ondansetron, polyethylene glycol      Intake/Output Summary (Last 24 hours) at 02/20/2021 0907  Last data filed at 02/20/2021 5051  Gross per 24 hour   Intake 490 ml   Output 450 ml   Net 40 ml       Physical Exam Performed:    BP (!) 96/57    Pulse 92    Temp 97.3 ??F (36.3 ??C) (Axillary)    Resp 17    Ht 5\' 11"  (1.803 m)    Wt 285 lb 11.5 oz (129.6 kg)    SpO2 97%    BMI 39.85 kg/m??     General appearance: No apparent distress  HEENT: Pupils equal, round, and reactive to light.  Conjunctivae/corneas clear.  Neck: Supple, with full range of motion. No jugular venous distention. Trachea midline.  Respiratory:  Normal respiratory effort. Clear to auscultation, bilaterally without Rales/Wheezes/Rhonchi.  Cardiovascular: Heart rate normal with normal rhythm with normal S1/S2 without murmurs, rubs or gallops.  Abdomen: Soft, non-tender, non-distended with normal bowel sounds.  Musculoskeletal: Bilateral lower extremity edema improving  Skin: Skin color, texture, turgor normal.  No rashes or lesions.  Neurologic:  Grossly non-focal.  Capillary Refill: Brisk,3 seconds, normal   Peripheral Pulses: +2 palpable, equal bilaterally       Labs:   Recent Labs     02/18/21  0658 02/19/21  0640 02/20/21  0704   WBC 12.6* 13.4* 12.9*   HGB 16.4* 16.2* 15.0   HCT 50.1* 49.0* 46.1   PLT 288 346 350     Recent Labs     02/18/21  0700 02/18/21  1615 02/19/21  0640   NA 137 136 133*   K 4.0 3.8 3.8   CL 89* 88* 86*   CO2 30 29 30  BUN 36* 42* 51*   CREATININE 1.4* 1.6* 2.4*   CALCIUM 9.7 9.9 9.8     No results for input(s): AST, ALT, BILIDIR, BILITOT, ALKPHOS in the last 72 hours.  No results for input(s): INR in the last 72 hours.  No results for input(s): CKTOTAL, TROPONINI in the last 72 hours.    Urinalysis:      Lab Results   Component Value Date/Time    NITRU Negative 02/09/2021 07:13 PM    WBCUA 3-5 02/09/2021 07:13 PM    BACTERIA RARE 04/19/2017 11:01 AM    RBCUA 3-4 02/09/2021 07:13 PM    BLOODU Negative 02/09/2021 07:13 PM    SPECGRAV 1.015 02/09/2021 07:13 PM    GLUCOSEU Negative 02/09/2021 07:13 PM       Radiology:  Marylene Buerger MODIFIED BARIUM SWALLOW W VIDEO   Final Result      Aspiration with thin and nectar thick liquids. Please see separate same-day dictation for more dedicated speech pathology findings.            FL MODIFIED BARIUM SWALLOW W VIDEO   Final Result      Aspiration with thin liquids      Please see speech pathology assessment for dietary recommendations.      XR CHEST PORTABLE   Final  Result      Mild left basilar airspace disease.      XR CHEST PORTABLE   Final Result      1. Endotracheal tube in stable position. Right IJ central venous catheter placed with the tip in the distal SVC in satisfactory position   2. Stable cardiac enlargement with no acute segmental consolidation               XR CHEST PORTABLE   Final Result   Impression:       Tip of endotracheal tube is 5 cm the carina.      Lungs are clear. No pneumothorax.      CT CHEST PULMONARY EMBOLISM W CONTRAST   Final Result      1. No evidence of any pulmonary embolism.   2. Four-chamber cardiac enlargement with reflux of contrast into the hepatic veins. This can be seen with elevated right heart pressures.   3. No acute pulmonary process.      XR CHEST PORTABLE   Final Result      Stable cardiac enlargement      No acute consolidation                Assessment/Plan:    Active Hospital Problems    Diagnosis    ??? Atrial fibrillation and flutter (HCC) [I48.91, I48.92]      Priority: Medium   ??? Paroxysmal atrial fibrillation (HCC) [I48.0]      Priority: Medium   ??? Atrial flutter with rapid ventricular response (HCC) [I48.92]      Priority: Medium   ??? Dilated cardiomyopathy (HCC) [I42.0]      Priority: Medium   ??? Acute respiratory failure with hypoxia (HCC) [J96.01]      Priority: Medium   ??? Lactic acidosis [E87.2]      Priority: Medium   ??? CHF (congestive heart failure), NYHA class I, acute on chronic, combined (HCC) [I50.43]      Priority: Medium   ??? Shortness of breath [R06.02]      Priority: Medium     60 yo female with a PMH of NHL, SLE who presented to the ED with chief complaints of  cough and shortness of breath for the past 6 weeks that got worse recently in the past few days  ??  Acute Exacerbation of New-Onset HFrEF   Cardiogenic shock  Recent ECHO on 6/17 with severe dilation of LV, EF of <20%, severe diffuse hypokinesis and grade 3 diastolic dysfunction.  - Off Milrinone since 6/25. Off Levophed  - IV lasix was transitioned to PO  but held d/t elevated creatinine  - Enteresto and aldactone also held d/t elevated creatinine  - Requires ischemic w/u, consider when euvolemic??  - Cards following - appreciate recs    Acute hypoxic respiratory failure 2/2 acute HFrEF s/p a flutter ablation (6/24)  - S/p extubation 02/15/2021  - Off sedation  - Off oxygen  - Resolved  ??  New-Onset Atrial Flutter??s/p ablation??(6/24)??  -??S/p ablation w/ EP 6/24  - Status post heparin drip.  Transition to Eliquis twice daily 06/28  - Received a dose of digoxin 125 mcgX1 and lopressor resumed 06/28  - Cont Po digoxin. Lopressor changed to toprol.  Dose decreased to 25 mg daily due to hypotension  - Off Amiodarone gtt and on Po amio now  - Cardiology following, appreciate recommendation    AKI  - Likely 2/2 hypoperfusion due to cardiogenic shock  - Avoid nephrotoxic medications  - Monitor creatinine, creatinine 2.8 today  - Holding lasix, aldactone and enteresto  - Continue gentle IVF    Insomnia  - Patient has been unable to sleep even after doses of melatonin and Ambien  - Seroquel 25 mg nightly was started, reduced to 12.5 mg nightly  - Cont seroquel 12.5 mg nightly    Anxiety  - Continue cymbalta 60 mg PO daily     SLE   - Holding Plaquenil 400 mg PO daily    OA   - Holding Relafen    COVID ruled out  - Has been tested positive in 06/29 and has been visiting the patient  - COVID test negative  - Droplet plus isolation removed    Morbid obesity BMI 40, complicates overall care    Non-Hodgkin's lymphoma status postchemo 2006-2007    DVT Prophylaxis: Eliquis  Diet: ADULT ORAL NUTRITION SUPPLEMENT; Lunch, Dinner; Frozen Oral Supplement  ADULT DIET; Dysphagia - Soft and Bite Sized; Moderately Thick (Honey)  Code Status: Full Code    PT/OT Eval Status: Ordered    Dispo -pending hemodynamic stability, AKI improvement, cardiology recs, ARU placement    Oneal Grout, MD

## 2021-02-20 NOTE — Progress Notes (Signed)
Covid 19 PCR positive. Droplet plus isolation ordered. Pt informed of isolation precautions. Dr. Tami Ribas notified.

## 2021-02-20 NOTE — Plan of Care (Signed)
Problem: Respiratory - Adult  Goal: Achieves optimal ventilation and oxygenation  Outcome: Progressing  Note: Pt on 2L./min NC at the start of shift with SpO2 above 93%. Upon reassessment pt's oxygen was off, SPO2 was 92% on room air. Will continue to monitor.     Problem: Musculoskeletal - Adult  Goal: Return mobility to safest level of function  Outcome: Progressing  Note: Pt has bilateral weakness in legs, pt is a stand and pivot to the chair. Pt will likely need rehab at discharge.     Problem: Safety - Adult  Goal: Free from fall injury  Outcome: Adequate for Discharge  Note: Pt has remained free from falls during shift. Pt's bed is locked in lowest position with alarm on, call light, bedside table, and personal belongings within reach.      Problem: ABCDS Injury Assessment  Goal: Absence of physical injury  Outcome: Adequate for Discharge  Note: Pt has remained free from physical injury during shift. Will continue to monitor.     Problem: Safety - Medical Restraint  Goal: Remains free of injury from restraints (Restraint for Interference with Medical Device)  Description: INTERVENTIONS:  1. Determine that other, less restrictive measures have been tried or would not be effective before applying the restraint  2. Evaluate the patient's condition at the time of restraint application  3. Inform patient/family regarding the reason for restraint  4. Q2H: Monitor safety, psychosocial status, comfort, nutrition and hydration  Outcome: Adequate for Discharge  Note: Pt has remained out of restraints. Pt calm, and cooperative with staff.     Problem: Skin/Tissue Integrity  Goal: Absence of new skin breakdown  Description: 1.  Monitor for areas of redness and/or skin breakdown  2.  Assess vascular access sites hourly  3.  Every 4-6 hours minimum:  Change oxygen saturation probe site  4.  Every 4-6 hours:  If on nasal continuous positive airway pressure, respiratory therapy assess nares and determine need for appliance  change or resting period.  Outcome: Adequate for Discharge  Note: Pt has remained free from new skin breakdown during shift. Will continue to monitor.     Problem: Skin/Tissue Integrity - Adult  Goal: Skin integrity remains intact  Outcome: Adequate for Discharge  Note: Pt skin has remained intact during shift. Will continue to monitor.

## 2021-02-20 NOTE — Progress Notes (Signed)
Sodium dropped from 128 this AM to current sodium of 124. Notified on call hospitalist and asked if nephrology consult is needed.

## 2021-02-20 NOTE — Plan of Care (Signed)
Problem: Skin/Tissue Integrity - Adult  Goal: Skin integrity remains intact  02/20/2021 1647 by Madaline Brilliant, RN  Note: Pt at risk for skin breakdown. Encouraged to ambulate with assistance in room. Pt declined getting up to chair this shift. Pt repositioned every 2 hours and as needed. Heels raised off the bed. Will continue to monitor for any new s/s of breakdown       Problem: Safety - Adult  Goal: Free from fall injury  02/20/2021 1647 by Madaline Brilliant, RN  Note: Bed locked in lowest position. 3/4 Bed rails up. Call light within reach. Pt belongings within reach. Bedside table within reach. Pt instructed to use call light prior to getting up. PT/OT consulted. Will continue to monitor.

## 2021-02-21 LAB — BASIC METABOLIC PANEL
Anion Gap: 12 (ref 3–16)
BUN: 52 mg/dL — ABNORMAL HIGH (ref 7–20)
CO2: 28 mmol/L (ref 21–32)
Calcium: 9.1 mg/dL (ref 8.3–10.6)
Chloride: 89 mmol/L — ABNORMAL LOW (ref 99–110)
Creatinine: 1.7 mg/dL — ABNORMAL HIGH (ref 0.6–1.1)
GFR African American: 37 — AB (ref >60)
GFR Non-African American: 31 — AB (ref >60)
Glucose: 95 mg/dL (ref 70–99)
Potassium: 3.7 mmol/L (ref 3.5–5.1)
Sodium: 129 mmol/L — ABNORMAL LOW (ref 136–145)

## 2021-02-21 LAB — CBC WITH AUTO DIFFERENTIAL
Basophils %: 0.9 %
Basophils Absolute: 0.1 10*3/uL (ref 0.0–0.2)
Eosinophils %: 3 %
Eosinophils Absolute: 0.3 10*3/uL (ref 0.0–0.6)
Hematocrit: 44 % (ref 36.0–48.0)
Hemoglobin: 15 g/dL (ref 12.0–16.0)
Lymphocytes %: 20.3 %
Lymphocytes Absolute: 2 10*3/uL (ref 1.0–5.1)
MCH: 29.7 pg (ref 26.0–34.0)
MCHC: 34 g/dL (ref 31.0–36.0)
MCV: 87.4 fL (ref 80.0–100.0)
MPV: 7.6 fL (ref 5.0–10.5)
Monocytes %: 21.4 %
Monocytes Absolute: 2.1 10*3/uL — ABNORMAL HIGH (ref 0.0–1.3)
Neutrophils %: 54.4 %
Neutrophils Absolute: 5.5 10*3/uL (ref 1.7–7.7)
Platelets: 341 10*3/uL (ref 135–450)
RBC: 5.04 M/uL (ref 4.00–5.20)
RDW: 15.8 % — ABNORMAL HIGH (ref 12.4–15.4)
WBC: 10 10*3/uL (ref 4.0–11.0)

## 2021-02-21 LAB — MAGNESIUM: Magnesium: 2.1 mg/dL (ref 1.80–2.40)

## 2021-02-21 MED FILL — DULOXETINE HCL 60 MG PO CPEP: 60 mg | ORAL | Qty: 1

## 2021-02-21 MED FILL — DIGOXIN 125 MCG PO TABS: 125 ug | ORAL | Qty: 1

## 2021-02-21 MED FILL — MELATONIN 5 MG PO TBDP: 5 mg | ORAL | Qty: 1

## 2021-02-21 MED FILL — METOPROLOL SUCCINATE ER 25 MG PO TB24: 25 mg | ORAL | Qty: 1

## 2021-02-21 MED FILL — AMIODARONE HCL 200 MG PO TABS: 200 mg | ORAL | Qty: 1

## 2021-02-21 MED FILL — ACETAMINOPHEN 325 MG PO TABS: 325 mg | ORAL | Qty: 2

## 2021-02-21 MED FILL — SEROQUEL 25 MG PO TABS: 25 mg | ORAL | Qty: 1

## 2021-02-21 MED FILL — ELIQUIS 5 MG PO TABS: 5 mg | ORAL | Qty: 1

## 2021-02-21 MED FILL — HYDROXYZINE HCL 10 MG PO TABS: 10 mg | ORAL | Qty: 1

## 2021-02-21 NOTE — Progress Notes (Signed)
Foley removed around 1740 on 02/21/21. No complications. Will monitor for signs of urinary retention.

## 2021-02-21 NOTE — Progress Notes (Signed)
Hospitalist Progress Note      PCP: Lindell Spar, APRN - CNP    Date of Admission: 02/09/2021    Chief Complaint: Shortness of breath     Hospital Course: see H&P     Subjective: Patient states that she is doing better.  Has dry cough.  Wants to rest at night.  Denies chest pain or shortness of breath.  Denies dizziness.    Called husband-Neal, discussed patient condition and answered questions.     Medications:  Reviewed    Infusion Medications   ??? sodium chloride     ??? dextrose       Scheduled Medications   ??? metoprolol succinate  25 mg Oral Daily   ??? [Held by provider] spironolactone  25 mg Oral Daily   ??? [Held by provider] sacubitril-valsartan  1 tablet Oral BID   ??? [Held by provider] furosemide  40 mg Oral BID   ??? QUEtiapine  12.5 mg Oral Nightly   ??? digoxin  125 mcg Oral Daily   ??? amiodarone  200 mg Oral BID   ??? [START ON 03/03/2021] amiodarone  200 mg Oral Daily   ??? melatonin  5 mg Oral Nightly   ??? sodium chloride flush  5-40 mL IntraVENous 2 times per day   ??? apixaban  5 mg Oral BID   ??? DULoxetine  60 mg Oral Daily   ??? [Held by provider] hydroxychloroquine  400 mg Oral Daily     PRN Meds: diphenhydrAMINE, diphenhydrAMINE, ipratropium-albuterol, sodium chloride (Inhalant), heparin (porcine), heparin (porcine), phenol, sodium chloride flush, sodium chloride, acetaminophen, glucose, dextrose bolus **OR** dextrose bolus, glucagon (rDNA), dextrose, hydrOXYzine HCl, albuterol, ondansetron **OR** ondansetron, polyethylene glycol      Intake/Output Summary (Last 24 hours) at 02/21/2021 0911  Last data filed at 02/21/2021 0646  Gross per 24 hour   Intake 720 ml   Output 1900 ml   Net -1180 ml       Physical Exam Performed:    BP (!) 93/58    Pulse 87    Temp 97.6 ??F (36.4 ??C) (Oral)    Resp 20    Ht 5\' 11"  (1.803 m)    Wt 287 lb 14.7 oz (130.6 kg)    SpO2 96%    BMI 40.16 kg/m??     General appearance: No apparent distress  HEENT: Pupils equal, round, and reactive to light. Conjunctivae/corneas clear.  Neck:  Supple, with full range of motion. No jugular venous distention. Trachea midline.  Respiratory:  Normal respiratory effort. Clear to auscultation, bilaterally without Rales/Wheezes/Rhonchi.  Cardiovascular: Heart rate normal with normal rhythm with normal S1/S2 without murmurs, rubs or gallops.  Abdomen: Soft, non-tender, non-distended with normal bowel sounds.  Musculoskeletal: Bilateral lower extremity edema improved  Skin: Skin color, texture, turgor normal.  No rashes or lesions.  Neurologic:  Grossly non-focal.  Capillary Refill: Brisk,3 seconds, normal   Peripheral Pulses: +2 palpable, equal bilaterally       Labs:   Recent Labs     02/19/21  0640 02/20/21  0704 02/21/21  0705   WBC 13.4* 12.9* 10.0   HGB 16.2* 15.0 15.0   HCT 49.0* 46.1 44.0   PLT 346 350 341     Recent Labs     02/20/21  0704 02/20/21  1850 02/21/21  0705   NA 128* 124* 129*   K 4.0 3.8 3.7   CL 85* 85* 89*   CO2 25 28 28    BUN 63* 56*  52*   CREATININE 2.8* 2.1* 1.7*   CALCIUM 9.2 9.1 9.1     No results for input(s): AST, ALT, BILIDIR, BILITOT, ALKPHOS in the last 72 hours.  No results for input(s): INR in the last 72 hours.  No results for input(s): CKTOTAL, TROPONINI in the last 72 hours.    Urinalysis:      Lab Results   Component Value Date/Time    NITRU Negative 02/09/2021 07:13 PM    WBCUA 3-5 02/09/2021 07:13 PM    BACTERIA RARE 04/19/2017 11:01 AM    RBCUA 3-4 02/09/2021 07:13 PM    BLOODU Negative 02/09/2021 07:13 PM    SPECGRAV 1.015 02/09/2021 07:13 PM    GLUCOSEU Negative 02/09/2021 07:13 PM       Radiology:  Marylene Buerger MODIFIED BARIUM SWALLOW W VIDEO   Final Result      Aspiration with thin and nectar thick liquids. Please see separate same-day dictation for more dedicated speech pathology findings.            FL MODIFIED BARIUM SWALLOW W VIDEO   Final Result      Aspiration with thin liquids      Please see speech pathology assessment for dietary recommendations.      XR CHEST PORTABLE   Final Result      Mild left basilar airspace  disease.      XR CHEST PORTABLE   Final Result      1. Endotracheal tube in stable position. Right IJ central venous catheter placed with the tip in the distal SVC in satisfactory position   2. Stable cardiac enlargement with no acute segmental consolidation               XR CHEST PORTABLE   Final Result   Impression:       Tip of endotracheal tube is 5 cm the carina.      Lungs are clear. No pneumothorax.      CT CHEST PULMONARY EMBOLISM W CONTRAST   Final Result      1. No evidence of any pulmonary embolism.   2. Four-chamber cardiac enlargement with reflux of contrast into the hepatic veins. This can be seen with elevated right heart pressures.   3. No acute pulmonary process.      XR CHEST PORTABLE   Final Result      Stable cardiac enlargement      No acute consolidation                Assessment/Plan:    Active Hospital Problems    Diagnosis    ??? Atrial fibrillation and flutter (HCC) [I48.91, I48.92]      Priority: Medium   ??? Paroxysmal atrial fibrillation (HCC) [I48.0]      Priority: Medium   ??? Atrial flutter with rapid ventricular response (HCC) [I48.92]      Priority: Medium   ??? Dilated cardiomyopathy (HCC) [I42.0]      Priority: Medium   ??? Acute respiratory failure with hypoxia (HCC) [J96.01]      Priority: Medium   ??? Lactic acidosis [E87.2]      Priority: Medium   ??? CHF (congestive heart failure), NYHA class I, acute on chronic, combined (HCC) [I50.43]      Priority: Medium   ??? Shortness of breath [R06.02]      Priority: Medium     60 yo female with a PMH of NHL, SLE who presented to the ED with chief complaints of cough and shortness  of breath for the past 6 weeks that got worse recently in the past few days  ??  Acute Exacerbation of New-Onset HFrEF   Cardiogenic shock  Recent ECHO on 6/17 with severe dilation of LV, EF of <20%, severe diffuse hypokinesis and grade 3 diastolic dysfunction.  - Off Milrinone since 6/25. Off Levophed  - IV lasix was transitioned to PO but on hold d/t elevated creatinine  -  Enteresto and aldactone on hold d/t elevated creatinine  - Requires ischemic w/u, consider when euvolemic??  - Discussed with Dr. Harrison Mons, will monitor renal function for now and resume medications if creatinine improved and BP better    Acute hypoxic respiratory failure 2/2 acute HFrEF s/p a flutter ablation (6/24)  - S/p extubation 02/15/2021  - Off sedation  - Off oxygen  - Resolved  ??  New-Onset Atrial Flutter??s/p ablation??(6/24)??  -??S/p ablation w/ EP 6/24  - Status post heparin drip.  Transition to Eliquis twice daily 06/28  - Received a dose of digoxin 125 mcgX1 and lopressor resumed 06/28  - Cont Po digoxin.   - Lopressor changed to toprol.  Dose decreased to 25 mg daily due to hypotension. Cont toprol  - Off Amiodarone gtt and on Po amio now  - Cardiology following, appreciate recommendation    AKI  - Likely 2/2 hypoperfusion due to cardiogenic shock  - Avoid nephrotoxic medications  - Holding lasix, aldactone and enteresto  - S/p IVF  - Monitor creatinine, creatinine 1.7 today    Insomnia  - Patient has been unable to sleep even after doses of melatonin and Ambien  - Seroquel 25 mg nightly was started, reduced to 12.5 mg nightly  - Cont seroquel 12.5 mg nightly    Anxiety  - Continue cymbalta 60 mg PO daily     SLE   - Holding Plaquenil 400 mg PO daily    OA   - Holding Relafen    COVID positive  - Repeat COVID test was negative but PCR positive  - Continue droplet plus isolation removed  - Continue supportive care    Morbid obesity BMI 40, complicates overall care    Non-Hodgkin's lymphoma status postchemo 2006-2007    DVT Prophylaxis: Eliquis  Diet: ADULT ORAL NUTRITION SUPPLEMENT; Lunch, Dinner; Frozen Oral Supplement  ADULT DIET; Dysphagia - Soft and Bite Sized; Moderately Thick (Honey)  Code Status: Full Code    PT/OT Eval Status: Ordered    Dispo -pending hemodynamic stability, AKI improvement, cardiology recs, ARU placement    Oneal Grout, MD

## 2021-02-21 NOTE — Progress Notes (Signed)
Cardiology Consult Service  Daily Progress Note        Admit Date:  02/09/2021  Primary cardiologist: Dr Harrison Mons / Dr. Clarise Cruz    Reason for Consultation/Chief Complaint: AHF, PAFRVR    Subjective:     Molly [name] is a 60 y.o. female with a past medical history of morbid obesity (BMI 43), non-Hodgkin's lymphoma status postchemotherapy 2006- 2007, SLE, RA.  Patient denies any previous cardiac history.  Patient reports she had MUGA test prior to chemotherapy and during treatment, last MUGA test was 2 years after her last chemotherapy and cardiac function was reportedly normal.  ??  Patient presented to the emergency room on 6/21 with progressively worse shortness of breath, lower extremity edema, weight gain, fatigue over the last 6 weeks.  She admits to occasional fluttering in her chest which is very mild.  At the emergency room she was afebrile, tachycardic in AF RVR/atrial flutter RVR, hypertensive, did not require any supplemental oxygen.  Electrolytes normal, proBNP 4700, troponin x1 negative, CBC normal, TSH normal, chest x-ray unremarkable, CTA chest negative for PE, significant biventricular dilatation consistent with cardiomyopathy.  ??  ECG 02/09/2021: Atrial flutter/coarse A. fib with RVR 138 bpm.  ??  Patient was admitted for acute heart failure and was placed on Lasix drip 10 mg an hour after receiving Lasix 60 mg IV x1.  Today I's and O's -3 L, weight down 2 pounds, creatinine stable 1.9, K3.4, magnesium 1.9.  Telemetry was personally reviewed, reveals A. fib with RVR 110-120 bpm.  ??  Echo 02/05/2021: LVD 6.8 cm, LVEF less than 20%, diastolic grade 3, increased LVEDP, severe RV dilatation and dysfunction, moderate MR/TR, RVSP 40 (15).  Images were personally reviewed.  ??  S/p atrial flutter ablation 6/24.  Patient could not be extubated after the procedure due to hypoxia and therefore was transferred intubated to the ICU.    Home diuretics: Lasix 40 mg daily, spironolactone 25 mg daily and  Entresto    COVID-positive 02/18/2021    Interval history:  Patient reports no complaints except for cough and feeling tired.  I's and O's even, creatinine 1.7 from 2.1 from 2.8, sodium up at 129.  Baseline creatinine 1.2.    Objective:     Medications:  ??? metoprolol succinate  25 mg Oral Daily   ??? [Held by provider] spironolactone  25 mg Oral Daily   ??? [Held by provider] sacubitril-valsartan  1 tablet Oral BID   ??? [Held by provider] furosemide  40 mg Oral BID   ??? QUEtiapine  12.5 mg Oral Nightly   ??? digoxin  125 mcg Oral Daily   ??? amiodarone  200 mg Oral BID   ??? [START ON 03/03/2021] amiodarone  200 mg Oral Daily   ??? melatonin  5 mg Oral Nightly   ??? sodium chloride flush  5-40 mL IntraVENous 2 times per day   ??? apixaban  5 mg Oral BID   ??? DULoxetine  60 mg Oral Daily   ??? [Held by provider] hydroxychloroquine  400 mg Oral Daily       IV drips:  ??? sodium chloride     ??? dextrose         PRN:  diphenhydrAMINE, diphenhydrAMINE, ipratropium-albuterol, sodium chloride (Inhalant), heparin (porcine), heparin (porcine), phenol, sodium chloride flush, sodium chloride, acetaminophen, glucose, dextrose bolus **OR** dextrose bolus, glucagon (rDNA), dextrose, hydrOXYzine HCl, albuterol, ondansetron **OR** ondansetron, polyethylene glycol    Vitals:    02/21/21 0644 02/21/21 0651 02/21/21 1013 02/21/21  1259   BP:  (!) 93/58 111/72 98/63   Pulse:  87 87 88   Resp:  20 20 18    Temp:  97.6 ??F (36.4 ??C) 97.8 ??F (36.6 ??C) 97.9 ??F (36.6 ??C)   TempSrc:  Oral Oral Oral   SpO2:  96% 98% 97%   Weight: 287 lb 14.7 oz (130.6 kg)      Height:           Intake/Output Summary (Last 24 hours) at 02/21/2021 1506  Last data filed at 02/21/2021 1300  Gross per 24 hour   Intake 240 ml   Output 2500 ml   Net -2260 ml     I/O last 3 completed shifts:  In: 970 [P.O.:970]  Out: 2350 [Urine:2350]  Wt Readings from Last 3 Encounters:   02/21/21 287 lb 14.7 oz (130.6 kg)   02/01/21 (!) 336 lb (152.4 kg)   01/29/21 (!) 331 lb (150.1 kg)       Admit Wt: Weight: (!)  315 lb (142.9 kg)   Todays Wt: Weight: 287 lb 14.7 oz (130.6 kg)    TELEMETRY personally reviewed:  AF changed to NSR at 12 midnight.     Physical Exam:         General Appearance:  Sedated, intubated.    Head:  Normocephalic, without obvious abnormality, atraumatic   Eyes:  PERRL, conjunctiva/corneas clear EOM intact  Ears normal   Throat no lesions       Nose: Nares normal, no drainage or sinus tenderness   Throat: Lips, mucosa, and tongue normal   Neck: Supple, symmetrical, trachea midline, no adenopathy, thyroid: not enlarged, symmetric, no tenderness/mass/nodules, no carotid bruit.        Lungs:   Normal respiratory rate, lungs CTA bilaterally.    Chest Wall:  No tenderness or deformity   Heart:  Regular rhythm, rate is controlled, S1, S2 normal, there is no murmur, there is no rub or gallop, cannot assess jvd, with no bilateral lower extremity edema   Abdomen:   Soft, non-tender, bowel sounds active all four quadrants,  no masses, no organomegaly       Extremities: Extremities normal, atraumatic, no cyanosis.    Pulses: 2+ and symmetric   Skin: Skin color, texture, turgor normal, no rashes or lesions   Pysch: Normal mood and affect   Neurologic: Normal gross motor and sensory exam.  Cranial nerves intact       Labs:   Recent Labs     02/19/21  0640 02/19/21  0640 02/20/21  0704 02/20/21  1850 02/21/21  0705   NA 133*   < > 128* 124* 129*   K 3.8   < > 4.0 3.8 3.7   BUN 51*   < > 63* 56* 52*   CREATININE 2.4*   < > 2.8* 2.1* 1.7*   CL 86*   < > 85* 85* 89*   CO2 30   < > 25 28 28    GLUCOSE 104*   < > 97 119* 95   CALCIUM 9.8   < > 9.2 9.1 9.1   MG 2.40  --  2.10  --  2.10    < > = values in this interval not displayed.     Recent Labs     02/19/21  0640 02/19/21  0640 02/20/21  0704 02/20/21  0704 02/21/21  0705   WBC 13.4*  --  12.9*  --  10.0   HGB 16.2*  --  15.0  --  15.0   HCT 49.0*  --  46.1  --  44.0   PLT 346  --  350  --  341   MCV 88.6   < > 88.4   < > 87.4    < > = values in this interval not  displayed.     No results for input(s): CHOLTOT, TRIG, HDL, CHOLHDL, LDL in the last 72 hours.    Invalid input(s): LIPIDCOMM, VLDCHOL  No results for input(s): PTT, INR in the last 72 hours.    Invalid input(s): PT  No results for input(s): CKTOTAL, CKMB, CKMBINDEX, TROPONINI in the last 72 hours.  No results for input(s): BNP in the last 72 hours.  No results for input(s): NTPROBNP in the last 72 hours.  No results for input(s): TSH in the last 72 hours.    Imaging:       Assessment & Plan:     1. ??Acute and new HFrEF. ??It is of unknown etiology, could be ischemic (severe CAD) versus nonischemic (PAF RVR, less likely??postchemotherapy, viral). ??Patient is volume overloaded and hypotensive requiring pressor support.   2. ??Non-Hodgkin's lymphoma status post chemotherapy, now in remission  3. ??SLE  4. ??Rheumatoid arthritis  5. ??PAFL RVR. ??Patient appears to be in atrial flutter/coarse atrial fibrillation with RVR. ??It is of unknown duration and she has not been on any anticoagulation. ??She was minimally symptomatic (minimal palpitations). Patient was started on anticoagulation and EP consulted; patient had AFL ablation on 6/24, remained in sinus post procedure.  6.  SVT 6/26, now in sinus after dig bolus on amio drip.   ??  ??  ??  -  No diuretics today while off supplemental oxygen, no IV fluids either.   -  Cw amio 200 bid x 14 days then daily per EP.   -  Cw Toprol to 25 daily  - C/w dig 125 mcg daily  - Strict I's and O's every shift and standing weights if possible, low-salt diet, daily BMP with reflex to Mg (correct lytes for goals K >4.0 and Mg > 2.0) and wean supplemental oxygen to off (or down to baseline supplemental oxygen requirements) for sats greater than 92-94%.  - Cw eliquis 5 bid.   - EP following.     Patient may be discharged on the above meds when ok with primary team and follow up with me in 1 week with a BMP prior to visit. Please call me with any questions.           I have spent 35 minutes of face  to face time with the patient with more than 50% spent counseling and coordinating care.         I have personally reviewed the reports and images of labs, radiological studies, cardiac studies including ECG's and telemetry, current and old medical records. The note was completed using EMR and Dragon dictation system. Every effort was made to ensure accuracy; however, inadvertent computerized transcription errors may be present.    All questions and concerns were addressed to the patient/family. Alternatives to my treatment were discussed.     Thank you for allowing to [place] to participate in the care or Tavonna A Baumert. Please call our service with questions.    Laurette Schimke, MD, [facility], FHFSA  [facility]  [address]  [address] [place] [address]  Ph: [phone]  Fax: [phone]

## 2021-02-21 NOTE — Plan of Care (Signed)
Problem: Respiratory - Adult  Goal: Achieves optimal ventilation and oxygenation  Note: COVID positive. Pt has remained on room air throughout this shift with Spo2 >90%. Denies shortness of breath. Breathing unlabored. Encouraged to ambulate in room with assistance -- currently sitting up in chair. Lungs clear/diminished to auscultation. Faint crackles to left lower lobe noted.     Problem: Skin/Tissue Integrity - Adult  Goal: Skin integrity remains intact  Recent Flowsheet Documentation  Taken 02/21/2021 1529 by Madaline Brilliant, RN  Skin Integrity Remains Intact: Monitor for areas of redness and/or skin breakdown     Problem: Safety - Adult  Goal: Free from fall injury  02/21/2021 1548 by Madaline Brilliant, RN  Flowsheets (Taken 02/18/2021 1014 by Jeralene Huff, RN)  Free From Fall Injury:   Instruct family/caregiver on patient safety   Based on caregiver fall risk screen, instruct family/caregiver to ask for assistance with transferring infant if caregiver noted to have fall risk factors  Note: Alert and oriented x4. Calls appropriately for assistance. Ambulates with x1 person assistance and gait belt and walker. Bed locked in lowest position. 3/4 Bed rails up. Call light within reach. Pt belongings within reach. Bedside table within reach. Pt instructed to use call light prior to getting up. Will continue to monitor.

## 2021-02-21 NOTE — Progress Notes (Signed)
SLP paged and asked to re-evaluate as pt seems to be improving. Pt seen drinking non-thickened water, no coughing or signs of aspiration. Pt was educated that SLP will need to re-evaluate in case of silent aspiration before diet can be advanced. Verbalized understanding.

## 2021-02-21 NOTE — Plan of Care (Signed)
D: pt teary eyed and upset with hospital d/t covid positive and having to be in isolation. Allowed pt to verbalize feeling and concerns. Emotional support and reassurance given. Impulsive about returning to bed on own. Stressed for safety and low b/p not to be amb or returning to bed without assist.   A: Cont to monitor during hourly rounds    Problem: Chronic Conditions and Co-morbidities  Goal: Patient's chronic conditions and co-morbidity symptoms are monitored and maintained or improved  Recent Flowsheet Documentation  Taken 02/20/2021 2100 by Briscoe Burns, RN  Care Plan - Patient's Chronic Conditions and Co-Morbidity Symptoms are Monitored and Maintained or Improved: Monitor and assess patient's chronic conditions and comorbid symptoms for stability, deterioration, or improvement     Problem: Metabolic/Fluid and Electrolytes - Adult  Goal: Electrolytes maintained within normal limits  Recent Flowsheet Documentation  Taken 02/20/2021 2100 by Briscoe Burns, RN  Electrolytes maintained within normal limits:   Monitor labs and assess patient for signs and symptoms of electrolyte imbalances   Administer electrolyte replacement as ordered   Monitor response to electrolyte replacements, including repeat lab results as appropriate   Instruct patient on fluid and nutrition restrictions as appropriate     Problem: Skin/Tissue Integrity - Adult  Goal: Skin integrity remains intact  02/20/2021 1647 by Madaline Brilliant, RN  Note: Pt at risk for skin breakdown. Encouraged to ambulate with assistance in room. Pt declined getting up to chair this shift. Pt repositioned every 2 hours and as needed. Heels raised off the bed. Will continue to monitor for any new s/s of breakdown

## 2021-02-22 LAB — BASIC METABOLIC PANEL
Anion Gap: 10 (ref 3–16)
Anion Gap: 13 (ref 3–16)
Anion Gap: 14 (ref 3–16)
BUN: 29 mg/dL — ABNORMAL HIGH (ref 7–20)
BUN: 38 mg/dL — ABNORMAL HIGH (ref 7–20)
BUN: 43 mg/dL — ABNORMAL HIGH (ref 7–20)
CO2: 26 mmol/L (ref 21–32)
CO2: 27 mmol/L (ref 21–32)
CO2: 28 mmol/L (ref 21–32)
Calcium: 9 mg/dL (ref 8.3–10.6)
Calcium: 9 mg/dL (ref 8.3–10.6)
Calcium: 9 mg/dL (ref 8.3–10.6)
Chloride: 90 mmol/L — ABNORMAL LOW (ref 99–110)
Chloride: 90 mmol/L — ABNORMAL LOW (ref 99–110)
Chloride: 90 mmol/L — ABNORMAL LOW (ref 99–110)
Creatinine: 1.2 mg/dL — ABNORMAL HIGH (ref 0.6–1.1)
Creatinine: 1.3 mg/dL — ABNORMAL HIGH (ref 0.6–1.1)
Creatinine: 1.4 mg/dL — ABNORMAL HIGH (ref 0.6–1.1)
GFR African American: 47 — AB (ref >60)
GFR African American: 51 — AB (ref >60)
GFR African American: 56 — AB (ref >60)
GFR Non-African American: 39 — AB (ref >60)
GFR Non-African American: 42 — AB (ref >60)
GFR Non-African American: 46 — AB (ref >60)
Glucose: 88 mg/dL (ref 70–99)
Glucose: 91 mg/dL (ref 70–99)
Glucose: 98 mg/dL (ref 70–99)
Potassium: 3.6 mmol/L (ref 3.5–5.1)
Potassium: 3.8 mmol/L (ref 3.5–5.1)
Potassium: 3.8 mmol/L (ref 3.5–5.1)
Sodium: 128 mmol/L — ABNORMAL LOW (ref 136–145)
Sodium: 130 mmol/L — ABNORMAL LOW (ref 136–145)
Sodium: 130 mmol/L — ABNORMAL LOW (ref 136–145)

## 2021-02-22 LAB — CBC WITH AUTO DIFFERENTIAL
Basophils %: 0.6 %
Basophils Absolute: 0 10*3/uL (ref 0.0–0.2)
Eosinophils %: 2 %
Eosinophils Absolute: 0.1 10*3/uL (ref 0.0–0.6)
Hematocrit: 42.2 % (ref 36.0–48.0)
Hemoglobin: 13.9 g/dL (ref 12.0–16.0)
Lymphocytes %: 20.7 %
Lymphocytes Absolute: 1.5 10*3/uL (ref 1.0–5.1)
MCH: 29 pg (ref 26.0–34.0)
MCHC: 32.9 g/dL (ref 31.0–36.0)
MCV: 88.3 fL (ref 80.0–100.0)
MPV: 7.4 fL (ref 5.0–10.5)
Monocytes %: 26.7 %
Monocytes Absolute: 1.9 10*3/uL — ABNORMAL HIGH (ref 0.0–1.3)
Neutrophils %: 50 %
Neutrophils Absolute: 3.6 10*3/uL (ref 1.7–7.7)
Platelets: 360 10*3/uL (ref 135–450)
RBC: 4.78 M/uL (ref 4.00–5.20)
RDW: 16.1 % — ABNORMAL HIGH (ref 12.4–15.4)
WBC: 7.3 10*3/uL (ref 4.0–11.0)

## 2021-02-22 LAB — MAGNESIUM: Magnesium: 2 mg/dL (ref 1.80–2.40)

## 2021-02-22 MED ORDER — LOSARTAN POTASSIUM 25 MG PO TABS
25 MG | Freq: Every day | ORAL | Status: DC
Start: 2021-02-22 — End: 2021-02-24
  Administered 2021-02-22 – 2021-02-24 (×3): 12.5 mg via ORAL

## 2021-02-22 MED FILL — ELIQUIS 5 MG PO TABS: 5 mg | ORAL | Qty: 1

## 2021-02-22 MED FILL — AMIODARONE HCL 200 MG PO TABS: 200 mg | ORAL | Qty: 1

## 2021-02-22 MED FILL — DULOXETINE HCL 60 MG PO CPEP: 60 mg | ORAL | Qty: 1

## 2021-02-22 MED FILL — DIGOXIN 125 MCG PO TABS: 125 ug | ORAL | Qty: 1

## 2021-02-22 MED FILL — ACETAMINOPHEN 325 MG PO TABS: 325 mg | ORAL | Qty: 2

## 2021-02-22 MED FILL — METOPROLOL SUCCINATE ER 25 MG PO TB24: 25 mg | ORAL | Qty: 1

## 2021-02-22 MED FILL — MELATONIN 5 MG PO TBDP: 5 mg | ORAL | Qty: 1

## 2021-02-22 MED FILL — LOSARTAN POTASSIUM 25 MG PO TABS: 25 mg | ORAL | Qty: 1

## 2021-02-22 MED FILL — SEROQUEL 25 MG PO TABS: 25 mg | ORAL | Qty: 1

## 2021-02-22 NOTE — Progress Notes (Signed)
Patient has been ambulating well to the restroom. Patient stated that she had more energy all day but became tired in the afternoon. Adequate urine output throughout the day.

## 2021-02-22 NOTE — Progress Notes (Signed)
Hospitalist Progress Note      PCP: Lindell Spar, APRN - CNP    Date of Admission: 02/09/2021    Chief Complaint: Shortness of breath     Hospital Course: see H&P     Subjective:   Has a lot of dry cough but no other symptoms. No n/v/d.     Medications:  Reviewed    Infusion Medications   ??? sodium chloride     ??? dextrose       Scheduled Medications   ??? losartan  12.5 mg Oral Daily   ??? metoprolol succinate  25 mg Oral Daily   ??? [Held by provider] spironolactone  25 mg Oral Daily   ??? [Held by provider] furosemide  40 mg Oral BID   ??? QUEtiapine  12.5 mg Oral Nightly   ??? digoxin  125 mcg Oral Daily   ??? amiodarone  200 mg Oral BID   ??? [START ON 03/03/2021] amiodarone  200 mg Oral Daily   ??? melatonin  5 mg Oral Nightly   ??? sodium chloride flush  5-40 mL IntraVENous 2 times per day   ??? apixaban  5 mg Oral BID   ??? DULoxetine  60 mg Oral Daily   ??? [Held by provider] hydroxychloroquine  400 mg Oral Daily     PRN Meds: diphenhydrAMINE, diphenhydrAMINE, ipratropium-albuterol, sodium chloride (Inhalant), heparin (porcine), heparin (porcine), phenol, sodium chloride flush, sodium chloride, acetaminophen, glucose, dextrose bolus **OR** dextrose bolus, glucagon (rDNA), dextrose, hydrOXYzine HCl, albuterol, ondansetron **OR** ondansetron, polyethylene glycol      Intake/Output Summary (Last 24 hours) at 02/22/2021 0923  Last data filed at 02/22/2021 0830  Gross per 24 hour   Intake 240 ml   Output 2600 ml   Net -2360 ml       Physical Exam Performed:    BP (!) 94/55    Pulse 80    Temp 97.2 ??F (36.2 ??C) (Oral)    Resp 16    Ht 5\' 11"  (1.803 m)    Wt 287 lb 14.7 oz (130.6 kg)    SpO2 94%    BMI 40.16 kg/m??     General appearance: No apparent distress  HEENT: Pupils equal, round, and reactive to light. Conjunctivae/corneas clear.  Neck: Supple, with full range of motion. No jugular venous distention. Trachea midline.  Respiratory:  Normal respiratory effort. Clear to auscultation, bilaterally without  Rales/Wheezes/Rhonchi.  Cardiovascular: Heart rate normal with normal rhythm with normal S1/S2 without murmurs, rubs or gallops.  Abdomen: Soft, non-tender, non-distended with normal bowel sounds.  Musculoskeletal: Bilateral lower extremity edema improved  Skin: Skin color, texture, turgor normal.  No rashes or lesions.  Neurologic:  Grossly non-focal.  Capillary Refill: Brisk,3 seconds, normal   Peripheral Pulses: +2 palpable, equal bilaterally       Labs:   Recent Labs     02/20/21  0704 02/21/21  0705 02/22/21  0439   WBC 12.9* 10.0 7.3   HGB 15.0 15.0 13.9   HCT 46.1 44.0 42.2   PLT 350 341 360     Recent Labs     02/21/21  0705 02/21/21  1810 02/22/21  0439   NA 129* 130* 130*   K 3.7 3.8 3.6   CL 89* 90* 90*   CO2 28 26 27    BUN 52* 43* 38*   CREATININE 1.7* 1.4* 1.3*   CALCIUM 9.1 9.0 9.0     No results for input(s): AST, ALT, BILIDIR, BILITOT, ALKPHOS in the  last 72 hours.  No results for input(s): INR in the last 72 hours.  No results for input(s): CKTOTAL, TROPONINI in the last 72 hours.    Urinalysis:      Lab Results   Component Value Date/Time    NITRU Negative 02/09/2021 07:13 PM    WBCUA 3-5 02/09/2021 07:13 PM    BACTERIA RARE 04/19/2017 11:01 AM    RBCUA 3-4 02/09/2021 07:13 PM    BLOODU Negative 02/09/2021 07:13 PM    SPECGRAV 1.015 02/09/2021 07:13 PM    GLUCOSEU Negative 02/09/2021 07:13 PM       Radiology:  Marylene Buerger MODIFIED BARIUM SWALLOW W VIDEO   Final Result      Aspiration with thin and nectar thick liquids. Please see separate same-day dictation for more dedicated speech pathology findings.            FL MODIFIED BARIUM SWALLOW W VIDEO   Final Result      Aspiration with thin liquids      Please see speech pathology assessment for dietary recommendations.      XR CHEST PORTABLE   Final Result      Mild left basilar airspace disease.      XR CHEST PORTABLE   Final Result      1. Endotracheal tube in stable position. Right IJ central venous catheter placed with the tip in the distal SVC in  satisfactory position   2. Stable cardiac enlargement with no acute segmental consolidation               XR CHEST PORTABLE   Final Result   Impression:       Tip of endotracheal tube is 5 cm the carina.      Lungs are clear. No pneumothorax.      CT CHEST PULMONARY EMBOLISM W CONTRAST   Final Result      1. No evidence of any pulmonary embolism.   2. Four-chamber cardiac enlargement with reflux of contrast into the hepatic veins. This can be seen with elevated right heart pressures.   3. No acute pulmonary process.      XR CHEST PORTABLE   Final Result      Stable cardiac enlargement      No acute consolidation                Assessment/Plan:    Active Hospital Problems    Diagnosis    ??? Atrial fibrillation and flutter (HCC) [I48.91, I48.92]      Priority: Medium   ??? Paroxysmal atrial fibrillation (HCC) [I48.0]      Priority: Medium   ??? Atrial flutter with rapid ventricular response (HCC) [I48.92]      Priority: Medium   ??? Dilated cardiomyopathy (HCC) [I42.0]      Priority: Medium   ??? Acute respiratory failure with hypoxia (HCC) [J96.01]      Priority: Medium   ??? Lactic acidosis [E87.2]      Priority: Medium   ??? CHF (congestive heart failure), NYHA class I, acute on chronic, combined (HCC) [I50.43]      Priority: Medium   ??? Shortness of breath [R06.02]      Priority: Medium     60 yo female with a PMH of NHL, SLE who presented to the ED with chief complaints of cough and shortness of breath for the past 6 weeks that got worse recently in the past few days  ??  Acute Exacerbation of New-Onset HFrEF   Cardiogenic shock  Possibly ischemic vs nonischemic/viral/post-chemo   Recent ECHO on 6/17 with severe dilation of LV, EF of <20%, severe diffuse hypokinesis and grade 3 diastolic dysfunction.  - Off Milrinone since 6/25. Off Levophed  - IV lasix was transitioned to PO but on hold d/t elevated creatinine  - Enteresto and aldactone on hold d/t elevated creatinine  - Requires ischemic w/u, consider when euvolemic??  -  Discussed with Dr. Harrison MonsBlake, will monitor renal function for now and resume medications if creatinine improved and BP better    Acute hypoxic respiratory failure 2/2 acute HFrEF s/p a flutter ablation (6/24)  - S/p extubation 02/15/2021  - Off sedation  - Off oxygen  - Resolved  ??  New-Onset Atrial Flutter??s/p ablation??(6/24)??  -??S/p ablation w/ EP 6/24  - Status post heparin drip.  Transition to Eliquis twice daily 06/28  - Received a dose of digoxin 125 mcgX1 and lopressor resumed 06/28  - Cont Po digoxin.   - Lopressor changed to toprol.  Dose decreased to 25 mg daily due to hypotension. Cont toprol  - Off Amiodarone gtt and on Po amio now  - Cardiology following, appreciate recommendation    AKI  - Likely 2/2 hypoperfusion due to cardiogenic shock  - Avoid nephrotoxic medications  - Holding lasix, aldactone and enteresto  - S/p IVF  - Monitor creatinine, creatinine 1.3 today    Insomnia  - Patient has been unable to sleep even after doses of melatonin and Ambien  - Seroquel 25 mg nightly was started, reduced to 12.5 mg nightly  - Cont seroquel 12.5 mg nightly    Anxiety  - Continue cymbalta 60 mg PO daily     SLE   - Holding Plaquenil 400 mg PO daily    OA   - Holding Relafen    COVID positive  - Repeat COVID test was negative but PCR positive  - Continue supportive care    Morbid obesity BMI 40, complicates overall care    Non-Hodgkin's lymphoma status postchemo 2006-2007    DVT Prophylaxis: Eliquis  Diet: ADULT ORAL NUTRITION SUPPLEMENT; Lunch, Dinner; Frozen Oral Supplement  ADULT DIET; Dysphagia - Soft and Bite Sized; Moderately Thick (Honey)  Code Status: Full Code    PT/OT Eval Status: Ordered    Dispo -pending hemodynamic stability, AKI improvement, cardiology recs, ARU placement    Attilio Zeitler, DO

## 2021-02-22 NOTE — Plan of Care (Signed)
Problem: Safety - Adult  Goal: Free from fall injury  02/22/2021 1356 by Shon Baton  Outcome: Progressing   Patient call light placed in reach; bed in lowest position with alarm on; patient instructed to use the call light for any ambulation including but not limited to use of the restroom and moving to the chair; floor is cleared and adequate lighting in room; Patient educated on changing positions slowly due to fluctuating blood pressure; patient gait belt and walker are used with assist.     Problem: Nutrition Deficit:  Goal: Optimize nutritional status  Outcome: Progressing    Patient provided with nutritional options and assistance with choosing meal options from dining services; patient educated on importance of proper nutrition and healing process; patient family was able to drop off food from home; patient continues to finish meals; has progressed to chewing on ice cubes. Patient continues to display more energy and ease with ambulation.

## 2021-02-22 NOTE — Plan of Care (Signed)
Problem: Safety - Adult  Goal: Free from fall injury  Outcome: Progressing  Note: Call lights within reach, fall precautions in place at all times, pt calls for assistance going to bathroom.     Problem: Pain  Goal: Verbalizes/displays adequate comfort level or baseline comfort level  Outcome: Progressing  Flowsheets (Taken 02/22/2021 0619)  Verbalizes/displays adequate comfort level or baseline comfort level:   Encourage patient to monitor pain and request assistance   Assess pain using appropriate pain scale   Administer analgesics based on type and severity of pain and evaluate response  Note: Pt c/o pain and relieved with PRN meds and rest, verbalized understanding on other alternatives of pain relief.

## 2021-02-22 NOTE — Progress Notes (Signed)
Speech Language Pathology  Facility/Department: [facility] 6 SOUTH TELEMETRY  Dysphagia Daily Treatment Note    NAME: Molly [name]  DOB: [birth year]  MRN: [id number]    Patient Diagnosis(es):   Patient Active Problem List    Diagnosis Date Noted   ??? Systemic lupus erythematosus ([place]) 09/22/2014   ??? Atrial fibrillation and flutter ([place])    ??? Paroxysmal atrial fibrillation (HCC)    ??? Atrial flutter with rapid ventricular response ([place])    ??? Dilated cardiomyopathy ([place])    ??? Acute respiratory failure with hypoxia ([place])    ??? Lactic acidosis    ??? CHF (congestive heart failure), NYHA class I, acute on chronic, combined ([place]) 02/09/2021   ??? Acute on chronic congestive heart failure ([place]) 02/01/2021   ??? Shortness of breath 01/29/2021   ??? Acute bacterial sinusitis 12/30/2020   ??? Seasonal allergies 05/08/2019   ??? 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 High risk medication use 01/25/2016   ??? Morbid obesity with BMI of 40.0-44.9, adult ([place]) 02/07/2014   ??? Vitamin D deficiency 09/19/2013   ??? Impaired glucose tolerance 02/06/2013   ??? HLD (hyperlipidemia) 02/06/2013   ??? History of non-Hodgkin's lymphoma      Allergies:   Allergies   Allergen Reactions   ??? Sulfa Antibiotics Rash     Recent Chest Xray 02/15/21  Mild left basilar airspace disease.    Chart reviewed.    Medical Diagnosis: Shortness of breath [R06.02]  CHF (congestive heart failure), NYHA class I, acute on chronic, combined ([place]) [I50.43]  Congestive heart failure, unspecified HF chronicity, unspecified heart failure type ([place]) [I50.9]   Treatment Diagnosis: Dysphagia    BSE Impression (02/16/21)-  Pt intubated 6/24 and self extubated 6/27. Pt alert and oriented but with weak, mostly aphonic voice. pt was assessed with trials with ice chips, water by cup and straw and applesauce. ROM of oral structures was [name].  Pt had no difficulty closing lips around spoon or drawing liquid up a straw.  Pt had no difficulty with mastication with ice chips.  There was  no anterior spillage or oral residue noted with any consistency. Pt had difficulty with initiating a dry swallow but had no difficulty with PO trials. Laryngeal movement noted with all trials. Pt noted to produce a strong reactive cough following first trial of water when consumed successive swallows of water by cup.  Provided pt with additional trials of water by cup and straw through out the session, but no other incidences of coughing or throat clearing noted, even when consumed 3 oz of water continuously. No s/s aspiration with 3 trials of applesauce. Given impaired vocal quality and recent extubation, recommend instrumental assessment prior to PO diet.   Dysphagia Diagnosis: Suspected needs further assessment    Initial MBS results (02/16/21)-  Pt presents with mild oral/pharyngeal phase dysphagia  Oral-pt had no difficulty with A-P transport of the bolus with any consistency.  pt functional mastication with solids but there is a concern for fatigue given that pt is deconditioned. Pt with intermittent premature spillage over the base of the tongue with thin by cup  Pharyngeal- suspect impaired subglottic pressure given weak vocal quality along with impaired tongue base retraction and reduced hyolaryngeal excursion which resulted in aspiration of thin by cup with a cough reflex with first trial.  Cough was strong but not adequate enough to clear the material from the airway. With subsequent trials with thin by cup and straw, pt with deep  penetration to the cords with spontaneous clearing. Attempted bolus hold and chin tuck strategies but this did not aid in airway protection. With trials of nectar by cup and straw, shallow/flash penetration with 100% spontaneous clearing was noted. With trials with pudding and cracker, there was minimal residue remaining in the valleculae after the swallow which was cleared with a liquid wash. Although there was only one instance of aspiration with thin liquids, there were  multiple instances of deep penetration placing pt at risk for aspiration. Given pt's aphonia and recent extubation, recommend dysphagia III/soft and bite sized diet with nectar/mildly thick liquids at this time. Will continue to assess for diet upgrade in the days to come.     Repeat MBS results (02/18/2021)  Oral phase: mild oral dysphagia characterized by slowed oral prep and A-P transit. Mild oral stasis.  ??  Pharyngeal phase: moderate pharyngeal dysphagia characterized by issues with sensation, timing, and hyolaryngeal mechanics resulting in deep penetration and tracheal aspiration of thin liquids and mildly thick liquids (inconsistently silent); attempted cup vs straw vs chin tuck; strategies not effective. Improved coordination for moderately thick liquids and solid PO, with adequate timeliness of epiglottic retraction, laryngeal excursion, and airway protection, with no further aspiration or penetration visualized. Mild pharyngeal stasis, clearing with subsequent swallows.    Pain: None expressed    Current Diet : ADULT ORAL NUTRITION SUPPLEMENT; Lunch, Dinner; Frozen Oral Supplement  ADULT DIET; Dysphagia - Soft and Bite Sized; Moderately Thick (Honey)   Recommended Form of Meds:  (TBD following MBS)  Compensatory Swallowing Strategies : Eat/Feed slowly,Upright as possible for all oral intake,Alternate solids and liquids,Effortful swallow,Small bites/sips     Treatment:  Pt seen bedside to address the following goals:  Treatment/Goals  1-The patient will tolerate instrumental swallowing procedure  6/28- goal met  ??  2- The pt/caregiver will demonstrate understanding of swallowing recommendations and concerns.  6/28-??????The pt and family member were??educated to purpose of the visit, anatomy and physiology of the swallow,??impact intubation can have on swallowing function, role vocal cords play in airway protectionconcerns for aspiration,??recommendation for modified barium swallow and a description of the  procedure. Both??stated comprehension and pt was in agreement with MBS.??con't goal  6/28- second session- the pt and RN were educated to the results of the MBS via review of the video from the study as well as diet recommendations, rational for nectar thick liquids, swallowing strategies and plan to continue to assess for possible diet advancement. Both stated comprehension. con't goal   6/29: Re-educated on results of MBS and diet level recommendations d/t risks associated with penetration/aspiration. Pt instructed on swallow strategies to utilize during all PO intake. Pt with adequate carry over, yet required cues for correct use. Cont.  6/30: Provided education to patient re: purpose of visit, swallow function, diet recommendations, recommendation for repeat MBS. Pt stated comprehension.  7/1: Educated pt re: role of SLP, purpose of visit, swallow function, prior MBSs, thickened liquid use/rationale, general aspiration precautions. Pt still somewhat confused, but appears to have improved comprehension from prior visit.  7/4: ??Pt now on covid precautions. RN reports family had been brining in thin liquids for patient prior to [place] visitor restrictions. Pt also reports drinking 2 large water jugs of thin liquid previous date with minimal coughing (she attributes to phlegm). SLP provided education regarding results of MBS last week, including aspiration of both nectar / thin liquids, inconsistently silent. Pt able to verbalize understanding. Also educated pt on possible consequences of aspiration, need  for repeated imaging prior to diet upgrade and recommendation to continue honey thick liquid. Encouraged pt to complete effortful swallows and pt agreed on repeating MBS next date.   Continue goal.    3-The patient will tolerate least restrictive diet without s/s aspiration or respiratory decline.   6/29: Pt seen this AM to trial current diet level. Pt assisted with oral care via toothbrush and toothpaste prior to  trials. Pt provided with cup sips nectar thick liquids and soft and bite sized solids. Pt readily self fed all trials. Pt instructed on taking small bites/sips and alternating solids/liquids. Pt with adequate carry over during trials given min cues. x1 thick, heavy sounding cough following completion of swallow of multiple cup sips nectar. No additional s/s penetration/aspiration. Pt provided with x10 ice chips to target effortful swallows. Pt completed with adequate effort x10. Recommend continue current diet level with use of strategies. Cont.   6/30: Patient seen up in recliner on room air, eating breakfast items (nectar thick via cup, yogurt), and seen with whole meds in puree (administered by RN). Voice remains aphonic. Pt tolerated puree well with no overt s/s aspiration, however pt consistently demonstrating reactive cough with nectar via cup (of note was productive of sputum x1). Given consistent cough with specifically nectar thick liquids, recommend repeat MBS to check for possible change in swallow function.  7/1: Patient seated upright, edge of bed, on supplemental O2 via nc. Covid test still pending. Continues to have cough at baseline, prior to PO intake. As per repeat MBS results yesterday, pt now on Honey thick liquids, as she was found to be aspirating nectar thick liquids yesterday. This date, assessed tolerance honey thick and soft bite-sized. Pt continues to demonstrate improved tolerance of honey thick liquids, and improved tolerance of advanced solid (soft and bite sized), with positive oral acceptance, improved timeliness of oral prep, positive swallow movement, good oral clearance, no overt signs of aspiration or penetration. Advance back to soft and bite-sized, continue honey thick liquids.  7/4: Pt with improved cognitive status this date (confirmed by RN); Voice is mild-moderately dysphonic which is also improvement. Pt completed effortful swallows x20 with ice chips after oral care. SLP  encouraged pt to repeat swallow exercise later this date to improve laryngeal strength/ airway protection. She trialed thin liquid via cup with delayed throat clearing. Recommend continue current diet and repeated MBS.   Cont goal    Patient/Family/Caregiver Education:  See goal 2 above    Compensatory Strategies:  Compensatory Swallowing Strategies : Eat/Feed slowly,Upright as possible for all oral intake,Alternate solids and liquids,Effortful swallow,Small bites/sips      Plan:  Continue dysphagia treatment with goals per plan of care.  Diet recommendations:     - advance to Dysphagia Soft & Bite-Sized (from Minced & Moist)  - continue Honey thick liquids  - Repeat MBS next date if able      DC recommendation: TBD  Treatment: 15  D/W nursing: Morey Hummingbird.  Needs met prior to leaving room, call button in reach.    Electronically signed by:  Carolee Rota, [place], [place], [place]    Pg. # [phone]    If patient is discharged prior to next treatment, this note will serve as the discharge summary

## 2021-02-22 NOTE — Consults (Signed)
Nutrition Education    Consult received for CHF diet education. Pt is in droplet plus isolation for Covid-19. Diet education materials placed in d/c instructions at this time. Will review with pt as time/isolation status allows prior to d/c.     Neita Garnet, RD, LD  Contact Number: [phone]

## 2021-02-22 NOTE — Progress Notes (Signed)
Was reviewed.  There were no overnight events.  Telemetry continues to show normal sinus rhythm.  Creatinine improving at 1.3 (baseline 1.2).  Patient remains off supplemental oxygen.    Assessment & Plan:   ??  1. ??Acute and new HFrEF. ??It is of unknown etiology, could be ischemic (severe CAD) versus nonischemic (PAF RVR, less likely??postchemotherapy, viral). ??Patient is now euvolemic but hemo tenuous.   2. ??Non-Hodgkin's lymphoma status post chemotherapy, now in remission  3. ??SLE  4. ??Rheumatoid arthritis  5. ??PAFL RVR. ??Patient appears to be in atrial flutter/coarse atrial fibrillation with RVR. ??It is of unknown duration and she has not been on any anticoagulation. ??She was minimally symptomatic (minimal palpitations). Patient was started on anticoagulation and EP consulted; patient had AFL ablation on 6/24, remained in sinus post procedure.  6.  SVT 6/26, now in sinus after dig bolus on amio drip.   7. COVID infection  ??  ??  ??  -  No diuretics today while off supplemental oxygen, no IV fluids either. May consider resuming home diuretics: She was on Lasix 40 mg daily and spironolactone 25 mg daily soon (consider lasix 20-40 daily, spiro 12.5-25 mg daily)  -  Cw amio 200 bid x 14 days then 200 daily per EP.   -  Cw Toprol 25 daily  - C/w dig 125 mcg daily  -Start losartan 12.5 mg daily today with holding parameters (would not restart Entresto in view of low normal BP.  -We will consider starting SGLT2 inhibitors for patient's HFrEF later in the clinic.   - Strict I's and O's every shift and standing weights if possible, low-salt diet, daily BMP with reflex to Mg (correct lytes for goals K >4.0 and Mg > 2.0) and wean supplemental oxygen to off (or down to baseline supplemental oxygen requirements) for sats greater than 92-94%.  - Cw eliquis 5 bid.   - EP following.   - If LVEF remains < 35% after 90 days of GDMT, will discuss referral to EP for possible device placement. In the meantime, patient will need a  LifeVest (as outpatient).     ??

## 2021-02-22 NOTE — Discharge Instructions (Addendum)
Heart Failure Nutrition Therapy  This diet will help you feel better and support your heart by reducing symptoms of fluid retention, shortness of breath and swelling.   You should focus on:  Limiting sodium in your diet by reading labels and limiting foods high in sodium.  Limit your daily sodium intake to 2,000 mg per day.  Select foods with 140 mg of sodium or less per serving.  Foods with more than 300 mg of sodium per serving may not fit into a reduced-sodium meal plan.  Check serving sizes. If you eat more than 1 serving, you will get more sodium than the amount listed.   Limiting fluid in your diet.  Ask your doctor how much fluid you can have per day  Remember foods that are liquid at room temperature such as popsicles, soup, ice cream and Jell-O are fluids.   Checking your weight to make sure you're not retaining too much fluid.  Weigh yourself every morning. If you gain 3 or more pounds in one day or 5 pounds within 1 week, call your doctor.  Foods to choose and avoid:  Avoid processed foods. Eat more fresh foods.  Fresh and frozen fruits and vegetables are good choices.  Choose fresh meats. Avoid processed meats such as bacon, sausage and hot dogs.  Do not add salt to your food while cooking or at the table.  Try dry or fresh herbs, pepper, lemon juice, or a sodium-free seasoning blend such as Mrs. Dash to add flavor to food.   Do not use a salt substitute.  Use caution at restaurants  Restaurant foods are high in sodium. Ask for your food to be cooked without salt and request sauces and dressing to come ???on the side.???

## 2021-02-23 ENCOUNTER — Inpatient Hospital Stay: Admit: 2021-02-23 | Payer: MEDICAID | Primary: Gerontology

## 2021-02-23 LAB — BASIC METABOLIC PANEL
Anion Gap: 9 (ref 3–16)
BUN: 26 mg/dL — ABNORMAL HIGH (ref 7–20)
CO2: 29 mmol/L (ref 21–32)
Calcium: 9 mg/dL (ref 8.3–10.6)
Chloride: 94 mmol/L — ABNORMAL LOW (ref 99–110)
Creatinine: 1.1 mg/dL (ref 0.6–1.1)
GFR African American: >60 (ref >60)
GFR Non-African American: 51 — AB (ref >60)
Glucose: 92 mg/dL (ref 70–99)
Potassium: 3.6 mmol/L (ref 3.5–5.1)
Sodium: 132 mmol/L — ABNORMAL LOW (ref 136–145)

## 2021-02-23 LAB — CBC WITH AUTO DIFFERENTIAL
Basophils %: 0.8 %
Basophils Absolute: 0 10*3/uL (ref 0.0–0.2)
Eosinophils %: 2.3 %
Eosinophils Absolute: 0.1 10*3/uL (ref 0.0–0.6)
Hematocrit: 41.7 % (ref 36.0–48.0)
Hemoglobin: 14 g/dL (ref 12.0–16.0)
Lymphocytes %: 25.1 %
Lymphocytes Absolute: 1.6 10*3/uL (ref 1.0–5.1)
MCH: 29.4 pg (ref 26.0–34.0)
MCHC: 33.5 g/dL (ref 31.0–36.0)
MCV: 87.8 fL (ref 80.0–100.0)
MPV: 7.7 fL (ref 5.0–10.5)
Monocytes %: 30.3 %
Monocytes Absolute: 1.9 10*3/uL — ABNORMAL HIGH (ref 0.0–1.3)
Neutrophils %: 41.5 %
Neutrophils Absolute: 2.6 10*3/uL (ref 1.7–7.7)
Platelets: 354 10*3/uL (ref 135–450)
RBC: 4.76 M/uL (ref 4.00–5.20)
RDW: 15.8 % — ABNORMAL HIGH (ref 12.4–15.4)
WBC: 6.2 10*3/uL (ref 4.0–11.0)

## 2021-02-23 LAB — MAGNESIUM: Magnesium: 2.1 mg/dL (ref 1.80–2.40)

## 2021-02-23 MED FILL — AMIODARONE HCL 200 MG PO TABS: 200 mg | ORAL | Qty: 1

## 2021-02-23 MED FILL — ACETAMINOPHEN 325 MG PO TABS: 325 mg | ORAL | Qty: 2

## 2021-02-23 MED FILL — DIGOXIN 125 MCG PO TABS: 125 ug | ORAL | Qty: 1

## 2021-02-23 MED FILL — ELIQUIS 5 MG PO TABS: 5 mg | ORAL | Qty: 1

## 2021-02-23 MED FILL — SEROQUEL 25 MG PO TABS: 25 mg | ORAL | Qty: 1

## 2021-02-23 MED FILL — MELATONIN 5 MG PO TBDP: 5 mg | ORAL | Qty: 1

## 2021-02-23 MED FILL — LOSARTAN POTASSIUM 25 MG PO TABS: 25 mg | ORAL | Qty: 1

## 2021-02-23 MED FILL — METOPROLOL SUCCINATE ER 25 MG PO TB24: 25 mg | ORAL | Qty: 1

## 2021-02-23 MED FILL — DULOXETINE HCL 60 MG PO CPEP: 60 mg | ORAL | Qty: 1

## 2021-02-23 NOTE — Progress Notes (Signed)
Hospitalist Progress Note      PCP: Lindell Spar, APRN - CNP    Date of Admission: 02/09/2021    Chief Complaint: Shortness of breath     Hospital Course: see H&P     Subjective:   +Cough, no chest pain, sob, abdominal pain, n/v or diarrhea. Overall feels better. Would like to do more therapy.      Medications:  Reviewed    Infusion Medications   ??? sodium chloride     ??? dextrose       Scheduled Medications   ??? losartan  12.5 mg Oral Daily   ??? metoprolol succinate  25 mg Oral Daily   ??? [Held by provider] spironolactone  25 mg Oral Daily   ??? [Held by provider] furosemide  40 mg Oral BID   ??? QUEtiapine  12.5 mg Oral Nightly   ??? digoxin  125 mcg Oral Daily   ??? amiodarone  200 mg Oral BID   ??? [START ON 03/03/2021] amiodarone  200 mg Oral Daily   ??? melatonin  5 mg Oral Nightly   ??? sodium chloride flush  5-40 mL IntraVENous 2 times per day   ??? apixaban  5 mg Oral BID   ??? DULoxetine  60 mg Oral Daily   ??? [Held by provider] hydroxychloroquine  400 mg Oral Daily     PRN Meds: diphenhydrAMINE, diphenhydrAMINE, ipratropium-albuterol, sodium chloride (Inhalant), heparin (porcine), heparin (porcine), phenol, sodium chloride flush, sodium chloride, acetaminophen, glucose, dextrose bolus **OR** dextrose bolus, glucagon (rDNA), dextrose, hydrOXYzine HCl, albuterol, ondansetron **OR** ondansetron, polyethylene glycol      Intake/Output Summary (Last 24 hours) at 02/23/2021 1339  Last data filed at 02/23/2021 0600  Gross per 24 hour   Intake 480 ml   Output 1750 ml   Net -1270 ml       Physical Exam Performed:    BP 113/77    Pulse 86    Temp 97.2 ??F (36.2 ??C) (Oral)    Resp 18    Ht 5\' 11"  (1.803 m)    Wt 286 lb 11.2 oz (130 kg)    SpO2 95%    BMI 39.99 kg/m??     General appearance: No apparent distress  HEENT: Pupils equal, round, and reactive to light. Conjunctivae/corneas clear.  Neck: Supple, with full range of motion. No jugular venous distention. Trachea midline.  Respiratory:  Normal respiratory effort. Clear to  auscultation, bilaterally without Rales/Wheezes/Rhonchi.  Cardiovascular: Heart rate normal with normal rhythm with normal S1/S2 without murmurs, rubs or gallops.  Abdomen: Soft, non-tender, non-distended with normal bowel sounds.  Musculoskeletal: Bilateral lower extremity edema improved  Skin: Skin color, texture, turgor normal.  No rashes or lesions.  Neurologic:  Grossly non-focal.  Capillary Refill: Brisk,3 seconds, normal   Peripheral Pulses: +2 palpable, equal bilaterally       Labs:   Recent Labs     02/21/21  0705 02/22/21  0439 02/23/21  0735   WBC 10.0 7.3 6.2   HGB 15.0 13.9 14.0   HCT 44.0 42.2 41.7   PLT 341 360 354     Recent Labs     02/22/21  0439 02/22/21  1845 02/23/21  0735   NA 130* 128* 132*   K 3.6 3.8 3.6   CL 90* 90* 94*   CO2 27 28 29    BUN 38* 29* 26*   CREATININE 1.3* 1.2* 1.1   CALCIUM 9.0 9.0 9.0     No results for input(s):  AST, ALT, BILIDIR, BILITOT, ALKPHOS in the last 72 hours.  No results for input(s): INR in the last 72 hours.  No results for input(s): CKTOTAL, TROPONINI in the last 72 hours.    Urinalysis:      Lab Results   Component Value Date/Time    NITRU Negative 02/09/2021 07:13 PM    WBCUA 3-5 02/09/2021 07:13 PM    BACTERIA RARE 04/19/2017 11:01 AM    RBCUA 3-4 02/09/2021 07:13 PM    BLOODU Negative 02/09/2021 07:13 PM    SPECGRAV 1.015 02/09/2021 07:13 PM    GLUCOSEU Negative 02/09/2021 07:13 PM       Radiology:  Marylene Buerger MODIFIED BARIUM SWALLOW W VIDEO   Final Result      Laryngeal aspiration on thin liquids.      Please refer to the detailed report of the speech therapist.            FL MODIFIED BARIUM SWALLOW W VIDEO   Final Result      Aspiration with thin and nectar thick liquids. Please see separate same-day dictation for more dedicated speech pathology findings.            FL MODIFIED BARIUM SWALLOW W VIDEO   Final Result      Aspiration with thin liquids      Please see speech pathology assessment for dietary recommendations.      XR CHEST PORTABLE   Final Result       Mild left basilar airspace disease.      XR CHEST PORTABLE   Final Result      1. Endotracheal tube in stable position. Right IJ central venous catheter placed with the tip in the distal SVC in satisfactory position   2. Stable cardiac enlargement with no acute segmental consolidation               XR CHEST PORTABLE   Final Result   Impression:       Tip of endotracheal tube is 5 cm the carina.      Lungs are clear. No pneumothorax.      CT CHEST PULMONARY EMBOLISM W CONTRAST   Final Result      1. No evidence of any pulmonary embolism.   2. Four-chamber cardiac enlargement with reflux of contrast into the hepatic veins. This can be seen with elevated right heart pressures.   3. No acute pulmonary process.      XR CHEST PORTABLE   Final Result      Stable cardiac enlargement      No acute consolidation                Assessment/Plan:    Active Hospital Problems    Diagnosis    ??? Atrial fibrillation and flutter (HCC) [I48.91, I48.92]      Priority: Medium   ??? Paroxysmal atrial fibrillation (HCC) [I48.0]      Priority: Medium   ??? Atrial flutter with rapid ventricular response (HCC) [I48.92]      Priority: Medium   ??? Dilated cardiomyopathy (HCC) [I42.0]      Priority: Medium   ??? Acute respiratory failure with hypoxia (HCC) [J96.01]      Priority: Medium   ??? Lactic acidosis [E87.2]      Priority: Medium   ??? CHF (congestive heart failure), NYHA class I, acute on chronic, combined (HCC) [I50.43]      Priority: Medium   ??? Shortness of breath [R06.02]      Priority: Medium  60 yo female with a PMH of NHL, SLE who presented to the ED with chief complaints of cough and shortness of breath for the past 6 weeks that got worse recently in the past few days  ??  Acute Exacerbation of New-Onset HFrEF   Cardiogenic shock  Possibly ischemic vs nonischemic/viral/post-chemo   Recent ECHO on 6/17 with severe dilation of LV, EF of <20%, severe diffuse hypokinesis and grade 3 diastolic dysfunction.  - Off Milrinone since 6/25. Off  Levophed  - IV lasix was transitioned to PO but on hold d/t elevated creatinine  - Enteresto and aldactone on hold d/t elevated creatinine  - Further ischemic work-up per cardiology recs  - EP consulted  - LifeVest will be needed outpatient    Acute hypoxic respiratory failure 2/2 acute HFrEF s/p a flutter ablation (6/24)  - S/p extubation 02/15/2021  - Off sedation  - Off oxygen  - Resolved  ??  New-Onset Atrial Flutter??s/p ablation??(6/24)??  -??S/p ablation w/ EP 6/24  - Status post heparin drip.  Transition to Eliquis twice daily 06/28  - Received a dose of digoxin 125 mcgX1 and lopressor resumed 06/28  - Cont Po digoxin.   - Lopressor changed to toprol.  Dose decreased to 25 mg daily due to hypotension. Cont toprol  - Off Amiodarone gtt and on Po amio now  - Cardiology following, appreciate recommendations    AKI  - Likely 2/2 hypoperfusion due to cardiogenic shock  - Avoid nephrotoxic medications  - Holding lasix, aldactone and enteresto  - S/p IVF  - Monitor creatinine, creatinine 1.3 today    Insomnia - improved  - Patient has been unable to sleep even after doses of melatonin and Ambien  - Seroquel 25 mg nightly was started, reduced to 12.5 mg nightly  - Cont seroquel 12.5 mg nightly    Anxiety  - Continue cymbalta 60 mg PO daily     SLE   - Holding Plaquenil 400 mg PO daily    OA   - Holding Relafen    COVID positive  - Repeat COVID test was negative but PCR positive  - Continue supportive care    Morbid obesity BMI 40, complicates overall care    Non-Hodgkin's lymphoma status postchemo 2006-2007    DVT Prophylaxis: Eliquis  Diet: ADULT ORAL NUTRITION SUPPLEMENT; Lunch, Dinner; Frozen Oral Supplement  ADULT DIET; Regular; Moderately Thick (Honey)  Code Status: Full Code    PT/OT Eval Status: Ordered    Dispo - Cr improving, cardiology/EP recs, ARU placement. Needs updated PT/OT scores.    Rosey Bath, DO

## 2021-02-23 NOTE — Progress Notes (Signed)
Physical Therapy  Facility/Department: [facility]  Physical Therapy Treatment    Name: Molly [name]  DOB: [birth year]  MRN: [id number]  Date of Service: 02/23/2021    Discharge Recommendations:  Molly [name] scored a 16/24 on the AM-PAC short mobility form. Current research shows that an AM-PAC score of 17 or less is typically not associated with a discharge to the patient's home setting. Based on the patient's AM-PAC score and their current functional mobility deficits, it is recommended that the patient have 5-7 sessions per week of Physical Therapy at d/c to increase the patient's independence.  At this time, this patient demonstrates complex nursing, medical, and rehabilitative needs, and would benefit from intensive rehabilitation services upon discharge from the Inpatient setting.  This patient demonstrates the ability to participate in and benefit from an intensive therapy program with a coordinated interdisciplinary team approach to foster frequent, structured, and documented communication among disciplines, who will work together to establish, prioritize, and achieve treatment goals. Please see assessment section for further patient specific details.    If patient discharges prior to next session this note will serve as a discharge summary.  Please see below for the latest assessment towards goals.       PT Equipment Recommendations  Other: plan to continue to assess and likely defer recommendations to next level of care - if d/c home, PT recommends RW      Patient Diagnosis(es): The primary encounter diagnosis was Shortness of breath. Diagnoses of Congestive heart failure, unspecified HF chronicity, unspecified heart failure type ([place]), Acute on chronic combined systolic and diastolic congestive heart failure ([place]), CHF (congestive heart failure), NYHA class I, acute on chronic, combined ([place]), Atrial flutter with rapid ventricular response ([place]), Dilated cardiomyopathy ([place]), and Acute  respiratory failure with hypoxia ([place]) were also pertinent to this visit.  Past Medical History:  has a past medical history of Amenorrhea, Cancer ([place]), Chronic combined systolic and diastolic CHF, NYHA class 2 ([place]), History of non-Hodgkin's lymphoma, and Systemic lupus.  Past Surgical History:  has a past surgical history that includes LEEP; knee surgery (Left); Knee Arthroplasty (Left, 10/25/2016); and joint replacement.    Assessment   Body Structures, Functions, Activity Limitations Requiring Skilled Therapeutic Intervention: Decreased functional mobility ;Decreased strength;Decreased safe awareness;Decreased cognition;Decreased endurance;Decreased balance  Assessment: Patient demonstrates improving overall mobility and cognition, with more consistent mobility, now alert and oriented x 3 with cues for date/situation secondary to prolonged hospitalization.  Patient able to ambulate with RW and CGA up to 43f this session.  Patient will continue to benefit from additional skilled PT intervention to facilitate safe mobility and to optimize (I) to promote return to prior level of function.  Treatment Diagnosis: impaired functional mobility  Therapy Prognosis: Good  Barriers to Learning: none  Requires PT Follow-Up: Yes  Activity Tolerance  Activity Tolerance: Patient limited by fatigue;Patient limited by endurance     Plan   Plan  Plan:  (2-5)  Current Treatment Recommendations: Strengthening,ROM,Balance training,Functional mobility training,Transfer training,Gait training,Patient/Caregiver education & training,Endurance training,Home exercise program,Safety education & training,Equipment evaluation, education, & procurement,Therapeutic activities  Safety Devices  Type of Devices: Call light within reach,Chair alarm in place,Gait belt,Left in chair,Nurse notified  Restraints  Restraints Initially in Place: No     Restrictions  Restrictions/Precautions  Restrictions/Precautions: Fall Risk (high fall risk)  Position  Activity Restriction  Other position/activity restrictions: strict I&O, up as tolerated; diet - dysphagia soft and bite sized, moderately thick/honey liquids  Subjective   General  Follows Commands: Within Functional Limits  General Comment  Comments: Patient seated in recliner upon arrival - agreeable to PT.  Eager to participate and improve.  Subjective  Subjective: Patient denies pain, reports fatigue overall.  Patient endorses "light bulb turned on Saturday" referring her memory/cognition - endorses awareness of previous hallucinations.           Cognition   Orientation  Overall Orientation Status: Within Functional Limits  Orientation Level: Oriented to place;Oriented to situation;Oriented to person (cues for time and situation - prolonged hospitalization)  Cognition  Overall Cognitive Status: WFL  Following Commands: Follows all commands without difficulty  Attention Span: Appears intact  Memory: Decreased recall of recent events;Decreased short term memory  Safety Judgement: Good awareness of safety precautions  Insights: Fully aware of deficits  Initiation: Does not require cues  Sequencing: Does not require cues     Objective   Heart Rate: 82  Heart Rate Source: Monitor  BP: 112/61  BP Location: Right upper arm  BP Method: Automatic  Patient Position: Supine  MAP (Calculated): 78  Resp: 18  SpO2: 95 %  O2 Device: None (Room air)     Transfers  Sit to Stand: Contact guard assistance (to RW)  Stand to sit: Contact guard assistance (from RW)  Stand Pivot Transfers: Contact guard assistance (with RW; toilet transfer CGA with RW)  Ambulation  Surface: level tile  Device: Rolling Walker  Assistance: Contact guard assistance  Quality of Gait: slow cadence, decreased (B) step length, foot clearance, and heel strike; guarded gait; fatigues - requires seated rest in between trials  Distance: 95f and 36f    Balance  Posture: Fair (forward flexed, rounded shoulders)  Sitting - Static: Good  Sitting - Dynamic:  Good  Standing - Static: Fair  Standing - Dynamic: Fair;-  Comments: CGA with RW           AM-PAC Score  AM-PAC Inpatient Mobility Raw Score : 16 (02/23/21 1200)  AM-PAC Inpatient T-Scale Score : 40.78 (02/23/21 1200)  Mobility Inpatient CMS 0-100% Score: 54.16 (02/23/21 1200)  Mobility Inpatient CMS G-Code Modifier : CK (02/23/21 1200)          Goals  Short Term Goals  Time Frame for Short term goals: dc - all goals ongoing and updated as indicated  Short term goal 1: Bed Mobility SBA.  Short term goal 2: Sit<>stand CGA. - goal met 7/5; updated: Pt. will demonstrate sit <-> stand with SBA and LRAD  Short term goal 3: Ambulate 4068with RW CGA.  Short term goal 4: Demo independence with LE HEP x 10 reps.  Patient Goals   Patient goals : "get stronger, go to rehab, be able to go home"       Education  Patient Education  Education Given To: Patient  Education Provided: Role of Therapy;Plan of Care  Education Provided Comments: use of call light, PT recommendations  Education Method: Demonstration;Verbal  Barriers to Learning: None  Education Outcome: Verbalized understanding;Demonstrated understanding      Therapy Time   Individual Concurrent Group Co-treatment   Time In 1120         Time Out 1158         Minutes 38                  Timed Code Treatment Minutes: 38 minutes    Total Treatment Minutes: 38 Minutes    If patient discharges prior to next  treatment, this note will serve as discharge summary.    Perlie Gold PT, DPT [phone]

## 2021-02-23 NOTE — Progress Notes (Signed)
Was reviewed.  There were no overnight events.  Telemetry continues to show normal sinus rhythm.  Creatinine improving at 1.1 (baseline).  Patient remains off supplemental oxygen.  Sodium improving at 132, I's and O's -2 L, off diuretics.    Assessment & Plan:   ??  1. ??Acute and new HFrEF. ??It is of unknown etiology, could be ischemic (severe CAD) versus nonischemic (PAF RVR, less likely??postchemotherapy, viral). ??Patient is now euvolemic but hemo tenuous.   2. ??Non-Hodgkin's lymphoma status post chemotherapy, now in remission  3. ??SLE  4. ??Rheumatoid arthritis  5. ??PAFL RVR. ??Patient appears to be in atrial flutter/coarse atrial fibrillation with RVR. ??It is of unknown duration and she has not been on any anticoagulation. ??She was minimally symptomatic (minimal palpitations). Patient was started on anticoagulation and EP consulted; patient had AFL ablation on 6/24, remained in sinus post procedure.  6.  SVT 6/26, now in sinus after dig bolus on amio drip.   7. COVID infection  ??  ??  ??  -  No diuretics today while off supplemental oxygen, no IV fluids either. We may consider resuming home diuretics: She was on Lasix 40 mg daily and spironolactone 25 mg daily soon (consider lasix 20-40 daily, spiro 12.5-25 mg daily)  -  Cw amio 200 bid x 14 days then 200 daily per EP.   -  Cw Toprol 25 daily  - C/w dig 125 mcg daily  -Started losartan 12.5 mg daily today with holding parameters (would not restart Entresto in view of low normal BP.  -We will consider starting SGLT2 inhibitors for patient's HFrEF later in the clinic.   - Strict I's and O's every shift and standing weights if possible, low-salt diet, daily BMP with reflex to Mg (correct lytes for goals K >4.0 and Mg > 2.0) and wean supplemental oxygen to off (or down to baseline supplemental oxygen requirements) for sats greater than 92-94%.  - Cw eliquis 5 bid.   - EP following.   - If LVEF remains < 35% after 90 days of GDMT, will discuss referral to EP for possible  device placement. In the meantime, patient will need a LifeVest (as outpatient).     ??

## 2021-02-23 NOTE — Procedures (Signed)
INSTRUMENTAL SWALLOW REPORT  MODIFIED BARIUM SWALLOW    NAME: Molly [name]   DOB: 07-03-1961  MRN: [id number]       Date of Eval: 02/23/2021     Ordering Physician: Dr. Renard Hamper  Radiologist: Helmut Muster     Referring Diagnosis(es): Referring Diagnosis: Dysphagia    Past Medical History:  has a past medical history of Amenorrhea, Cancer ([place]), Chronic combined systolic and diastolic CHF, NYHA class 2 ([place]), History of non-Hodgkin's lymphoma, and Systemic lupus.  Past Surgical History:  has a past surgical history that includes LEEP; knee surgery (Left); Knee Arthroplasty (Left, 10/25/2016); and joint replacement.       Date of Prior Study: 02/16/21, 02/18/21  Type of Study: Repeat MBS  Results of MBS 02/18/21: Oral phase: mild oral dysphagia characterized by slowed oral prep and A-P transit. Mild oral stasis. Pharyngeal phase: moderate pharyngeal dysphagia characterized by issues with sensation, timing, and hyolaryngeal mechanics resulting in deep penetration and tracheal aspiration of thin liquids and mildly thick liquids (inconsistently silent); attempted cup vs straw vs chin tuck; strategies not effective. Improved coordination for moderately thick liquids and solid PO, with adequate timeliness of epiglottic retraction, laryngeal excursion, and airway protection, with no further aspiration or penetration visualized. Mild pharyngeal stasis, clearing with subsequent swallows.    Recent CXR 02/15/21:  Impression   ??   Mild left basilar airspace disease.       Patient Complaints/Reason for Referral:  Molly [name] was referred for a MBS to assess the efficiency of his/her swallow function, assess for aspiration, and to make recommendations regarding safe dietary consistencies, effective compensatory strategies, and safe eating environment.  Patient complaints: Dislikes thickened liquids    Onset of problem:   Date of Onset: 02/09/21    General Comment  Comments: Per admitting H&P (02/09/2021): 'Molly [name] is a  60 yo female with a PMH of non- hodgkin's lymphoma, Systemic lupus, rheumatoid arthritis who presented to the ED with chief complaints of cough and shortness of breath for the past 6 weeks that got worse recently in the past few days. As per the patient about 6 weeks ago she was having shortness of breath while walking around with decrease in her levels of activity along with cough with white to clear sputum. It was also associated with weight gain of 46 pounds and lower extremity edema. Patient saw her PCP who recommended her top get an ECHO done, results of which came back last night showing an EF of 20%. Patient has been on lasix 40 mg daily and was started on enteresto and aldactone. She does state of having mild difficulty in lying flat in bed and has been using around 1 pillow which is her baseline. Patient was feeling worse this morning, with a cardiology appointment not until next week she decided to come to the hospital for further evaluation.  In the ED patient was found to be afebrile with stable vitals, labs were remarkable of Na of 131, normal WBC count, Pro BNP of 4707. Chest Xray with stable cardiac enlargement and CTPE with no evidence of any PE with four chambercardiac enlargement with reflux of contrast into the hepatic veins. Patient was given a dose of lasix 60 mg IV and was admitted to the hospital for treatment of CHF with cardiology consultation.  '  Subjective  Subjective: Pt alert and sitting upright on gurney in radiology suite.    Behavior/Cognition/Vision/Hearing:  Behavior/Cognition: Alert;Cooperative;Pleasant mood  Vision: Within Functional Limits  Hearing:  Within functional limits    Impressions:  Treatment Dx and ICD 10: Oropharyngeal Dysphagia   Patient Position: Lateral and      Oral Phase: Pt with adequate labial seal without anterior loss of liquid. Appropriate bolus control with cued bolus hold. Noted premature spillage of other liquid trials into pharynx prior to swallow. Timely  and coordinated mastication of regular texture solid. No significant oral stasis.    Pharyngeal Phase: Swallow initiation delayed with bolus in pyriform sinus at times. Complete velar elevation. Decreased laryngeal elevation and hyoid excursion resulting in incomplete airway closure. Patient demonstrated trace-min amount of deep penetration of thin liquid via cup and straw which was aspirated during subsequent trials. Delayed coughing observed in response to aspiration. Chin tuck improved airway closure and patient demonstrated only trace flash penetration of thin liquids. Trace penetration of nectar thick liquids occurred with head in neutral position which did not clear with completion of the swallow. No penetration or aspiration of honey thick liquid, puree or regular texture solids. Reduced base of tongue retraction resulted in moderate amount of vallecular stasis, clearing with thin liquid rinse. Adequate pharyngeal stripping wave and PES opening.    Upper Esophageal function: Unremarkable    Consistencies Administered: Regular;Pureed;Thin straw;Thin cup;Mildly Thick cup;Thin teaspoon;Moderately Thick cup     Dysphagia Outcome Severity Scale: Level 5: Mild dysphagia- Distant supervision. May need one diet consistency restricted  Penetration-Aspiration Scale (PAS): 7 - Material enters the airway, passes below the vocal folds, and is not ejected from the trachea despite effort    Recommended Diet:  Solid consistency: Regular  Liquid consistency: Thin  Liquid administration via: Other (comment) (Cup or straw with chin tuck)   Medications whole with puree    Medication administration: PO with puree      Recommendations/Treatment  Requires SLP Intervention: Yes   D/C Recommendations: Ongoing speech therapy is recommended at next level of care;Ongoing speech therapy is recommended during this hospitalization  Postural Changes and/or Swallow Maneuvers: Chin tuck    Recommended Exercises:    Therapeutic Interventions:  Diet tolerance monitoring;Patient/Family education    Education: Images and recommendations were reviewed with Dr. Doyne Keel following this exam.   Patient Education: Results of MBS  Patient Education Response: Verbalizes understanding    Safety Devices  Safety Devices in place: Yes  Type of devices: Call light within reach  Restraints Initially in Place: No      Goals:      1-The patient will tolerate instrumental swallowing procedure  6/28- goal met  2- The pt/caregiver will demonstrate understanding of swallowing recommendations and concerns.  6/28-??????The pt and family member were??educated to purpose of the visit, anatomy and physiology of the swallow,??impact intubation can have on swallowing function, role vocal cords play in airway protectionconcerns for aspiration,??recommendation for modified barium swallow and a description of the procedure. Both??stated comprehension and pt was in agreement with MBS.??con't goal  6/28- second session- the pt and RN were educated to the results of the MBS via review of the video from the study as well as diet recommendations, rational for nectar thick liquids, swallowing strategies and plan to continue to assess for possible diet advancement. Both stated comprehension. con't goal   6/29: Re-educated on results of MBS and diet level recommendations d/t risks associated with penetration/aspiration. Pt instructed on swallow strategies to utilize during all PO intake. Pt with adequate carry over, yet required cues for correct use. Cont.  6/30: Provided education to patient re: purpose of visit, swallow function,  diet recommendations, recommendation for repeat MBS. Pt stated comprehension.  7/1: Educated pt re: role of SLP, purpose of visit, swallow function, prior MBSs, thickened liquid use/rationale, general aspiration precautions. Pt still somewhat confused, but appears to have improved comprehension from prior visit.  7/4: ??Pt now on covid precautions. RN reports family had been  brining in thin liquids for patient prior to [place] visitor restrictions. Pt also reports drinking 2 large water jugs of thin liquid previous date with minimal coughing (she attributes to phlegm). SLP provided education regarding results of MBS last week, including aspiration of both nectar / thin liquids, inconsistently silent. Pt able to verbalize understanding. Also educated pt on possible consequences of aspiration, need for repeated imaging prior to diet upgrade and recommendation to continue honey thick liquid. Encouraged pt to complete effortful swallows and pt agreed on repeating MBS next date.   7/5: Repeat MBS this date. Following MBS, pt educated on pharyngeal stasis clearing with liquid rinse, benefit of chin tuck to prevent aspiration and appropriate posture. Pt able to verbalize understanding of all information.   Continue goal.  ??  3-The patient will tolerate least restrictive diet without s/s aspiration or respiratory decline.??  6/29: Pt seen this AM to trial current diet level. Pt assisted with oral care via toothbrush and toothpaste prior to trials. Pt provided with cup sips nectar thick liquids and soft and bite sized solids. Pt readily self fed all trials. Pt instructed on taking small bites/sips and alternating solids/liquids. Pt with adequate carry over during trials given min cues. x1 thick, heavy sounding cough following completion of swallow of multiple cup sips nectar. No additional s/s penetration/aspiration. Pt provided with x10 ice chips to target effortful swallows. Pt completed with adequate effort x10. Recommend continue current diet level with use of strategies. Cont.   6/30: Patient seen up in recliner on room air, eating breakfast items (nectar thick via cup, yogurt), and seen with whole meds in puree (administered by RN). Voice remains aphonic. Pt tolerated puree well with no overt s/s aspiration, however pt consistently demonstrating reactive cough with nectar via cup (of note was  productive of sputum x1). Given consistent cough with specifically nectar thick liquids, recommend repeat MBS to check for possible change in swallow function.  7/1: Patient seated upright, edge of bed, on supplemental O2 via nc. Covid test still pending. Continues to have cough at baseline, prior to PO intake. As per repeat MBS results yesterday, pt now on Honey thick liquids, as she was found to be aspirating nectar thick liquids yesterday. This date, assessed tolerance honey thick and soft bite-sized. Pt continues to demonstrate improved tolerance of honey thick liquids, and improved tolerance of advanced solid (soft and bite sized), with positive oral acceptance, improved timeliness of oral prep, positive swallow movement, good oral clearance, no overt signs of aspiration or penetration. Advance back to soft and bite-sized, continue honey thick liquids.  7/4: Pt with improved cognitive status this date (confirmed by RN); Voice is mild-moderately dysphonic which is also improvement. Pt completed effortful swallows x20 with ice chips after oral care. SLP encouraged pt to repeat swallow exercise later this date to improve laryngeal strength/ airway protection. She trialed thin liquid via cup with delayed throat clearing. Recommend continue current diet and repeated MBS.   Cont goal      Esophageal Phase  Esophageal Screen: WNL    Pain      Pain Level: 5  Pain Type: Acute pain  Pain Location: Head  Therapy Time:   Individual Concurrent Group Co-treatment   Time In 1105         Time Out 1130         Minutes 25            Total Treatment Time: [place], [place], CCC-SLP, [id number] 02/23/2021, 1:42 PM

## 2021-02-23 NOTE — Plan of Care (Signed)
Problem: Discharge Planning  Goal: Discharge to home or other facility with appropriate resources  02/23/2021 1145 by Anselm Jungling, RN  Outcome: Progressing     Problem: Chronic Conditions and Co-morbidities  Goal: Patient's chronic conditions and co-morbidity symptoms are monitored and maintained or improved  02/23/2021 1145 by Anselm Jungling, RN  Outcome: Progressing

## 2021-02-23 NOTE — Progress Notes (Addendum)
Occupational Therapy  Daily Treatment Note  Patient Name: Molly [name]  MRN: [id number]    Chart Reviewed: Yes       Other position/activity restrictions: up as tol prn     Additional Pertinent Hx: Adm 6/21- direct adm from PCP office for SOB, LE edema, cough.  Pt has fairly new diagnosis of CHF with EF of 20%.  CT chest (-) for PE.  Hx of NHL, SLE,  RA.  Rapid response 6/22 due to difficulty breathing.  Cath lab 6/24 for ablation, had to be intubated due to resp distress & sent to ICU afterwards.  Self-extubated 6/27.      Diagnosis: SOB  Treatment Diagnosis: impaired ADLs / functional transfers / decreased BUE functional use and impaired mentation 2/2 CHF    Subjective: Pt semi supine in bed upon arrival, pleasant and agreeable to OT session.    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 Pt tolerated ~4 minutes in stance for grooming routine with SBA and no overt LOB.    ADLs    Grooming: Oral hygiene and washing face in stance at sink with SBA.  Footwear: Pt total A to don socks, pt unable to achieve figure 4 and reports difficulty with flexing at trunk, pt will benefit from sock aid to improve independence.      UE Exercises: In order to increase activity tolerance and BUE strength for improved ADL performance, pt completed x 10 chest throws using pillow while seated. Pt reported moderate exertion with activity.    Dynamic Standing balance:In stance with BUE support on RW pt completed x8 standing marches with CGA and no overt LOB, to grade up task pt was instructed to complete with unilateral UE support on RW, pt completed x 6 with CGA and slight instability when WSing to R but no overt LOB.     Activity Tolerance: Pt tolerated session well with minimal DOE, recovered with short seated rest breaks.     Patient Education: Pt educated on recommendation to receive further rehab prior to d/c home as well as shower chair and insertion of grab bars in shower for improved safety with bathing and energy conservation. Pt verbalized understanding.    Safety Devices in Place:Pt left in bedside recliner at end of session with chair alarm engaged, call light within reach  and all needs met.      Assessment: Pt demonstrating improved activity tolerance, functional mobility and standing balance with use of RW but reports being well below baseline as pt is highly independent. Pt eager to d/c to continue more intensive rehabilitation prior to d/c home. Pt largest limitation is her decreased activity tolerance but she demonstrates great motivation. Pt will benefit from ongoing skilled OT prior to d/c home.      Discharge Recommendations: Molly [name] scored a 16/24 on the AM-PAC ADL Inpatient form. Current research shows that an AM-PAC score of 17 or less is typically not associated with a discharge to the patient's home setting. Based on the patient's AM-PAC score and their current ADL deficits, it is  recommended that the patient have 5-7 sessions per week of Occupational Therapy at d/c to increase the patient's independence.  At this time, this patient demonstrates complex nursing, medical, and rehabilitative needs, and would benefit from intensive rehabilitation services upon discharge from the Inpatient setting.  This patient demonstrates the ability to participate in and benefit from an intensive therapy program with a coordinated interdisciplinary team approach to foster frequent, structured, and documented communication among disciplines, who will work together to establish, prioritize, and achieve treatment goals. Please see assessment section for further patient specific details.    If patient discharges prior to next session this note will serve as a discharge summary.  Please see below for the latest assessment towards goals.   Equipment Needs:        Goals:  Short Term Goals  Time Frame for Short term goals: at d/c  Short Term Goal 1: Stance x 5 mins with CGA for ADLs /IADLs - not met  Short Term Goal 2: Grooming with setup and <3 v.cues for completion - not met  Short Term Goal 3: Commode transfers with Min A -RTS prn -goal met 7/5  Short Term Goal 4: Feed self with setup- part met  Short Term Goal 5: Oriented to environment 4/5 attempts.-goal met 7/5           Therapy Time:   Individual Concurrent Group Co-treatment   Time In 1020         Time Out 1045         Minutes 25           Timed Code Treatment Minutes:  25    Total Treatment Minutes:  25    Plan:      Times per Week: 2-5x   Times per Day: Daily    If patient is discharged prior to next treatment, this note will serve as the discharge summary.      Precious Bard, OT

## 2021-02-23 NOTE — Progress Notes (Signed)
Electrophysiology - PROGRESS NOTE    Admit Date: 02/09/2021     Chief Complaint: AF/AFL/RVR     Interval History:   Patient seen and examined and notes reviewed. Patient is being followed for AF/AFL RVR. Pt remains in NSR on tele. No evidence of AF seen overnight. HR controlled. Remains on amio and digoxin. Pt denies palpitations, heart racing, dizziness, lightheadedness. No CP, SOB, PND, orthopnea, BLE edema. BP soft at times. Working with PT at bedside.    In: 730 [P.O.:720; I.V.:10]  Out: 2950    Wt Readings from Last 2 Encounters:   02/23/21 286 lb 11.2 oz (130 kg)   02/01/21 (!) 336 lb (152.4 kg)         Data:   Scheduled Meds:   Scheduled Meds:  ??? losartan  12.5 mg Oral Daily   ??? metoprolol succinate  25 mg Oral Daily   ??? [Held by provider] spironolactone  25 mg Oral Daily   ??? [Held by provider] furosemide  40 mg Oral BID   ??? QUEtiapine  12.5 mg Oral Nightly   ??? digoxin  125 mcg Oral Daily   ??? amiodarone  200 mg Oral BID   ??? [START ON 03/03/2021] amiodarone  200 mg Oral Daily   ??? melatonin  5 mg Oral Nightly   ??? sodium chloride flush  5-40 mL IntraVENous 2 times per day   ??? apixaban  5 mg Oral BID   ??? DULoxetine  60 mg Oral Daily   ??? [Held by provider] hydroxychloroquine  400 mg Oral Daily     Continuous Infusions:  ??? sodium chloride     ??? dextrose       PRN Meds:.diphenhydrAMINE, diphenhydrAMINE, ipratropium-albuterol, sodium chloride (Inhalant), heparin (porcine), heparin (porcine), phenol, sodium chloride flush, sodium chloride, acetaminophen, glucose, dextrose bolus **OR** dextrose bolus, glucagon (rDNA), dextrose, hydrOXYzine HCl, albuterol, ondansetron **OR** ondansetron, polyethylene glycol  Continuous Infusions:  ??? sodium chloride     ??? dextrose         Intake/Output Summary (Last 24 hours) at 02/23/2021 1158  Last data filed at 02/23/2021 0600  Gross per 24 hour   Intake 730 ml   Output 2250 ml   Net -1520 ml       CBC:   Lab Results   Component Value  Date/Time    WBC 6.2 02/23/2021 07:35 AM    HGB 14.0 02/23/2021 07:35 AM    PLT 354 02/23/2021 07:35 AM     BMP:  Lab Results   Component Value Date/Time    NA 132 02/23/2021 07:35 AM    K 3.6 02/23/2021 07:35 AM    K 3.7 02/09/2021 06:40 PM    CL 94 02/23/2021 07:35 AM    CO2 29 02/23/2021 07:35 AM    BUN 26 02/23/2021 07:35 AM    CREATININE 1.1 02/23/2021 07:35 AM    GLUCOSE 92 02/23/2021 07:35 AM     INR:   Lab Results   Component Value Date/Time    INR 1.23 02/15/2021 11:17 AM    INR 1.27 02/10/2021 10:37 AM    INR 1.04 10/25/2016 11:37 AM        CARDIAC LABS  ENZYMES:No results for input(s): CKMB, CKMBINDEX, TROPONINI in the last 72 hours.    Invalid input(s): CKTOTAL;3  FASTING LIPID PANEL:  Lab Results   Component Value Date/Time    HDL 33 02/10/2021 05:22 AM    LDLCALC 72 02/10/2021 05:22 AM    TRIG 82 02/10/2021 05:22  AM     LIVER PROFILE:  Lab Results   Component Value Date/Time    AST 86 02/12/2021 03:23 PM    AST 61 01/29/2021 12:16 PM    ALT 79 02/12/2021 03:23 PM    ALT 78 01/29/2021 12:16 PM       -----------------------------------------------------------------  Telemetry: Personally reviewed  NSR    Objective:   Vitals: BP 112/61    Pulse 82    Temp 98 ??F (36.7 ??C) (Oral)    Resp 18    Ht 5\' 11"  (1.803 m)    Wt 286 lb 11.2 oz (130 kg)    SpO2 95%    BMI 39.99 kg/m??   General appearance: alert, appears stated age and cooperative, No acute distress   Skin: Skin color, texture, turgor normal. No rashes or ecchymosis.  Neck: no JVD, supple, symmetrical, trachea midline   Lungs: , no accessory muscle use, no respiratory distress  Heart: RRR  Extremities: No edema, DP +  Psychiatric: normal insight and affect    Patient Active Problem List:     History of non-Hodgkin's lymphoma     Impaired glucose tolerance     HLD (hyperlipidemia)     Vitamin D deficiency     Morbid obesity with BMI of 40.0-44.9, adult (HCC)     Systemic lupus erythematosus (HCC)     High risk medication use     Status post total left  knee replacement     Seasonal allergies     Acute bacterial sinusitis     Shortness of breath     Acute on chronic congestive heart failure (HCC)     CHF (congestive heart failure), NYHA class I, acute on chronic, combined (HCC)     Atrial flutter with rapid ventricular response (HCC)     Dilated cardiomyopathy (HCC)     Acute respiratory failure with hypoxia (HCC)     Lactic acidosis     Paroxysmal atrial fibrillation (HCC)     Atrial fibrillation and flutter (HCC)        Assessment & Plan:    1. AFL /RVR  2. AF  3. CMP  4. Acute HFrEF  5. Covid +    Molly [name] Is a 60 y.o. woman w/ PMH non-hodgkins lymphoma, s/p radiation, lupus, COVID who p/w SOB, Echo showed newly reduced EF 20%, found to be in AFL with poorly controlled ventricular rate , s/p RFA of AFL (02/12/21, Dr. 02/14/21) became hypoxic post procedure and was unable to be intubated, developed AF, placed on amio gtt, extubated 02/15/21, noted to be covid + (02/18/21)    AF/AFL  - In NSR  - S/p RFA of AFL w/ pAF noted  - CHADSVASc at least 2  - On Eliquis 5 mg BID, no bleeding, continue  - On amio 200 mg BID x 2 weeks then 200 mg QD  - On Toprol 25 mg QD, dig 125 mcg QD    CMP  - EF <20%  - NYHA II/III  - QRS 104  - On Toprol 25 mg QD, losartan 12.5 mg QD  - spironolactone and lasix on hold d/t AKI  - Cr improved  - Dr. 02/20/21 following      Electronically signed by Harrison Mons, APRN - CNP on 02/23/21 at 11:58 AM EDT    [name]

## 2021-02-23 NOTE — Care Coordination-Inpatient (Signed)
Case Management Assessment           Daily Note                 Date/ Time of Note: 02/23/2021 10:54 AM         Note completed by: Milford Cage, RN    Patient Name: Molly [name]  Date of Birth: February 23, 1961    Diagnosis:Shortness of breath [R06.02]  CHF (congestive heart failure), NYHA class I, acute on chronic, combined (HCC) [I50.43]  Congestive heart failure, unspecified HF chronicity, unspecified heart failure type (HCC) [I50.9]  Patient Admission Status: Inpatient    Date of Admission:02/09/2021  6:11 PM Length of Stay: 14 GLOS: GMLOS: 4.3    Current Plan of Care:     Cardiology  following  :   ??PT/OT Eval Status: Ordered  ??  Dispo -pending hemodynamic stability, AKI improvement, cardiology recs, ARU placement  ________________________________________________________________________________________  PT AM-PAC: 13 / 24 per last evaluation on: 02/18/2021    OT AM-PAC: 13 / 24 per last evaluation on: 02/18/2021    DME Needs for discharge:    [facility]  Pre cert  To be  started  After  PTOT updates  Today to confirm  Still approp for  IPR:   ________________________________________________________________________________________  Discharge Plan:  [facility]  Following        Tentative discharge date: tbd    Current barriers to discharge:  Update  PT:  And  start  [place] pre cert .     Referrals completed: Not Applicable    Resources/ information provided: Not indicated at this time  ________________________________________________________________________________________  Case Management Notes:    Transfer  Out ot  ICU  :  To [address] [place]     Patient  Currently off  Unit  For  Testing :      The original plan was to return home.   Will need  Up dated  PT/OT evaluations and recommendations  Noted    Rec:  Placement:    -      [facility]  Following  , Molly Asp is  [facility] liaison [phone]-  [facility]  Able to accept .    Spoke with pt spouse / and  SIL    Who are  Agreeable and  They  Will  likely start  Pre cert today :      Cont to follow  .     ??  ??Patient is from home with spouse, completely independent pta. ??CM will continue to follow for discharge needs    Molly [name] and her family were provided with choice of provider; she and her family are in agreement with the discharge plan.    Care Transition Patient: No    Milford Cage, RN  [name]  Case Management Department  Ph: [phone]

## 2021-02-23 NOTE — Plan of Care (Addendum)
Problem: Respiratory - Adult  Goal: Achieves optimal ventilation and oxygenation  Outcome: Progressing  Flowsheets (Taken 02/23/2021 0244)  Achieves optimal ventilation and oxygenation:  ??? Assess for changes in respiratory status  ??? Assess for changes in mentation and behavior  ??? Position to facilitate oxygenation and minimize respiratory effort  Note: Pt Spo2 >92% on room air. Diminished breath sounds noted.     Problem: Musculoskeletal - Adult  Goal: Return mobility to safest level of function  Outcome: Progressing  Flowsheets (Taken 02/23/2021 0244)  Return Mobility to Safest Level of Function: Assess patient stability and activity tolerance for standing, transferring and ambulating with or without assistive devices  Note: Pt assist x1 with walker and gait belt. Tolerated well.     Problem: Confusion  Goal: Confusion, delirium, dementia, or psychosis is improved or at baseline  Description: INTERVENTIONS:  1. Assess for possible contributors to thought disturbance, including medications, impaired vision or hearing, underlying metabolic abnormalities, dehydration, psychiatric diagnoses, and notify attending LIP  2. Institute high risk fall precautions, as indicated  3. Provide frequent short contacts to provide reality reorientation, refocusing and direction  4. Decrease environmental stimuli, including noise as appropriate  5. Monitor and intervene to maintain adequate nutrition, hydration, elimination, sleep and activity  6. If unable to ensure safety without constant attention obtain sitter and review sitter guidelines with assigned personnel  7. Initiate Psychosocial CNS and Spiritual Care consult, as indicated  Outcome: Progressing  Flowsheets (Taken 02/23/2021 0244)  Effect of thought disturbance (confusion, delirium, dementia, or psychosis) are managed with adequate functional status:  ??? Assess for contributors to thought disturbance, including medications, impaired vision or hearing, underlying metabolic  abnormalities, dehydration, psychiatric diagnoses, notify LIP  ??? Institute high risk fall precautions, as indicated  ??? Provide frequent short contacts to provide reality reorientation, refocusing and direction  Note: Pt Aox4 during shift

## 2021-02-24 LAB — CBC WITH AUTO DIFFERENTIAL
Basophils %: 2 %
Basophils Absolute: 0.1 10*3/uL (ref 0.0–0.2)
Eosinophils %: 2 %
Eosinophils Absolute: 0.1 10*3/uL (ref 0.0–0.6)
Hematocrit: 43.8 % (ref 36.0–48.0)
Hemoglobin: 14.5 g/dL (ref 12.0–16.0)
Lymphocytes %: 34 %
Lymphocytes Absolute: 2.2 10*3/uL (ref 1.0–5.1)
MCH: 29.1 pg (ref 26.0–34.0)
MCHC: 33 g/dL (ref 31.0–36.0)
MCV: 88.2 fL (ref 80.0–100.0)
MPV: 7.3 fL (ref 5.0–10.5)
Monocytes %: 19 %
Monocytes Absolute: 1.2 10*3/uL (ref 0.0–1.3)
Neutrophils %: 43 %
Neutrophils Absolute: 2.8 10*3/uL (ref 1.7–7.7)
Platelets: 423 10*3/uL (ref 135–450)
RBC: 4.97 M/uL (ref 4.00–5.20)
RDW: 16 % — ABNORMAL HIGH (ref 12.4–15.4)
WBC: 6.5 10*3/uL (ref 4.0–11.0)

## 2021-02-24 LAB — POCT GLUCOSE
POC Glucose: 93 mg/dl (ref 70–99)
POC Glucose: 89 mg/dl (ref 70–99)
POC Glucose: 91 mg/dl (ref 70–99)

## 2021-02-24 LAB — BASIC METABOLIC PANEL
Anion Gap: 13 (ref 3–16)
BUN: 26 mg/dL — ABNORMAL HIGH (ref 7–20)
CO2: 25 mmol/L (ref 21–32)
Calcium: 9 mg/dL (ref 8.3–10.6)
Chloride: 94 mmol/L — ABNORMAL LOW (ref 99–110)
Creatinine: 1 mg/dL (ref 0.6–1.1)
GFR African American: >60 (ref >60)
GFR Non-African American: 57 — AB (ref >60)
Glucose: 108 mg/dL — ABNORMAL HIGH (ref 70–99)
Potassium: 3.9 mmol/L (ref 3.5–5.1)
Sodium: 132 mmol/L — ABNORMAL LOW (ref 136–145)

## 2021-02-24 LAB — MAGNESIUM: Magnesium: 2.3 mg/dL (ref 1.80–2.40)

## 2021-02-24 MED ORDER — MELATONIN 5 MG PO TBDP
5 MG | Freq: Every evening | ORAL | Status: AC
Start: 2021-02-24 — End: 2022-04-12

## 2021-02-24 MED ORDER — FUROSEMIDE 40 MG PO TABS
40 MG | Freq: Two times a day (BID) | ORAL | Status: DC
Start: 2021-02-24 — End: 2021-02-24
  Administered 2021-02-24: 16:00:00 40 mg via ORAL

## 2021-02-24 MED ORDER — DIGOXIN 125 MCG PO TABS
125 MCG | ORAL_TABLET | Freq: Every day | ORAL | 3 refills | Status: DC
Start: 2021-02-24 — End: 2021-03-15

## 2021-02-24 MED ORDER — AMIODARONE HCL 200 MG PO TABS
200 MG | ORAL_TABLET | ORAL | 0 refills | Status: DC
Start: 2021-02-24 — End: 2021-03-03

## 2021-02-24 MED ORDER — LOSARTAN POTASSIUM 25 MG PO TABS
25 MG | ORAL_TABLET | Freq: Every day | ORAL | 3 refills | Status: DC
Start: 2021-02-24 — End: 2021-04-05

## 2021-02-24 MED ORDER — METOPROLOL SUCCINATE ER 25 MG PO TB24
25 MG | ORAL_TABLET | Freq: Every day | ORAL | 3 refills | Status: DC
Start: 2021-02-24 — End: 2021-03-15

## 2021-02-24 MED ORDER — APIXABAN 5 MG PO TABS
5 MG | ORAL_TABLET | Freq: Two times a day (BID) | ORAL | 2 refills | Status: DC
Start: 2021-02-24 — End: 2021-05-20

## 2021-02-24 MED FILL — ELIQUIS 5 MG PO TABS: 5 mg | ORAL | Qty: 1

## 2021-02-24 MED FILL — DIGOXIN 125 MCG PO TABS: 125 ug | ORAL | Qty: 1

## 2021-02-24 MED FILL — AMIODARONE HCL 200 MG PO TABS: 200 mg | ORAL | Qty: 1

## 2021-02-24 MED FILL — LOSARTAN POTASSIUM 25 MG PO TABS: 25 mg | ORAL | Qty: 1

## 2021-02-24 MED FILL — METOPROLOL SUCCINATE ER 25 MG PO TB24: 25 mg | ORAL | Qty: 1

## 2021-02-24 MED FILL — DULOXETINE HCL 60 MG PO CPEP: 60 mg | ORAL | Qty: 1

## 2021-02-24 MED FILL — SEROQUEL 25 MG PO TABS: 25 mg | ORAL | Qty: 1

## 2021-02-24 MED FILL — MELATONIN 5 MG PO TBDP: 5 mg | ORAL | Qty: 1

## 2021-02-24 MED FILL — FUROSEMIDE 40 MG PO TABS: 40 mg | ORAL | Qty: 1

## 2021-02-24 NOTE — Care Coordination-Inpatient (Addendum)
Precert started yesterday afternoon for [facility] for [employer] started per [facility] (admissions at [facility]).  Arline Asp will call when they receive precert. Electronically signed by Everardo All, RN on 02/24/2021 at 11:07 AM     Addendum:  Patient no longer requires needs of inpatient rehab. Called [facility], Arline Asp, and cancelled rehab referral. Patient going home with home care, but [employer] insurance makes this case hard to place with home care. Patient, also, has [employer] to be placed before going home today. [facility] not in network with insurance. Called [facility].(left message), called [facility] (left message). Electronically signed by Everardo All, RN on 02/24/2021 at 2:04 PM     Addendum:Perefcserved Dr. Vedia Coffer for home care orders. Called Tamela Oddi, RN ([facility] [phone]) to let her know that patient is going home today and Jennette Kettle will be up shortly to educate patient. [employer] rep will be here at 4:00 pm today to fit LV on and educate patient. Called sister-in-law to let her know updates. Communicating with patient's [facility] nurse, Raynelle Fanning, RN. Electronically signed by Everardo All, RN on 02/24/2021 at 2:32 PM     Addendum:  Patient wants to go home without home care and has people (friends and family lined up for the next week to stay with her). Refusing home care at this time. Told her if she wants home care after she gets home, to call her PCP. She understands. Collins Scotland, patient's nurse and perfectserved Dr. Vedia Coffer this info. Electronically signed by Everardo All, RN on 02/24/2021 at 2:55 PM

## 2021-02-24 NOTE — Consults (Incomplete)
[facility]   HEART FAILURE PROGRAM      NAME:  Molly [name]  MEDICAL RECORD NUMBER:  [id number]  AGE: 60 y.o.   GENDER: female  DOB: 07-19-1961  TODAY'S DATE:  02/24/2021    Subjective:     VISIT TYPE: {Misc; evaluation/follow-up:17928::"follow-up"}     ADMITTING PHYSICIAN:  Waymond Cera, MD    PAST MEDICAL HISTORY        Diagnosis Date   ??? Amenorrhea    ??? Cancer (HCC)    ??? Chronic combined systolic and diastolic CHF, NYHA class 2 (HCC)    ??? History of non-Hodgkin's lymphoma 2006    treated with radiation and chemo   ??? Systemic lupus     followed by Dr. Harley Hallmark       SOCIAL HISTORY    Social History     Tobacco Use   ??? Smoking status: Former Smoker     Packs/day: 0.50     Years: 18.00     Pack years: 9.00     Types: Cigarettes     Quit date: 2008     Years since quitting: 14.5   ??? Smokeless tobacco: Never Used   Vaping Use   ??? Vaping Use: Never used   Substance Use Topics   ??? Alcohol use: Yes     Alcohol/week: 1.0 standard drink     Types: 1 Shots of liquor per week     Comment: 2x/week   ??? Drug use: No       ALLERGIES    Allergies   Allergen Reactions   ??? Sulfa Antibiotics Rash       MEDICATIONS  Scheduled Meds:  ??? furosemide  40 mg Oral BID   ??? losartan  12.5 mg Oral Daily   ??? metoprolol succinate  25 mg Oral Daily   ??? spironolactone  25 mg Oral Daily   ??? QUEtiapine  12.5 mg Oral Nightly   ??? digoxin  125 mcg Oral Daily   ??? amiodarone  200 mg Oral BID   ??? [START ON 03/03/2021] amiodarone  200 mg Oral Daily   ??? melatonin  5 mg Oral Nightly   ??? sodium chloride flush  5-40 mL IntraVENous 2 times per day   ??? apixaban  5 mg Oral BID   ??? DULoxetine  60 mg Oral Daily   ??? [Held by provider] hydroxychloroquine  400 mg Oral Daily       ADMIT DATE: 02/09/2021      Objective:     ADMISSION DIAGNOSIS:   Shortness of breath [R06.02]  CHF (congestive heart failure), NYHA class I, acute on chronic, combined (HCC) [I50.43]  Congestive heart failure, unspecified HF chronicity, unspecified heart  failure type (HCC) [I50.9]     BP 114/60    Pulse 88    Temp 97.8 ??F (36.6 ??C) (Oral)    Resp 18    Ht 5\' 11"  (1.803 m)    Wt 286 lb 12.8 oz (130.1 kg)    SpO2 94%    BMI 40.00 kg/m??     ADMIT:  Weight: (!) 315 lb (142.9 kg)    TODAY: Weight: 286 lb 12.8 oz (130.1 kg)    Wt Readings from Last 10 Encounters:   02/24/21 286 lb 12.8 oz (130.1 kg)   02/01/21 (!) 336 lb (152.4 kg)   01/29/21 (!) 331 lb (150.1 kg)   12/30/20 (!) 320 lb (145.2 kg)   07/01/19 (!) 315 lb (  142.9 kg)   05/02/17 294 lb (133.4 kg)   12/27/16 293 lb (132.9 kg)   11/07/16 294 lb 15.6 oz (133.8 kg)   10/25/16 295 lb (133.8 kg)   10/17/16 295 lb (133.8 kg)          Intake/Output Summary (Last 24 hours) at 02/24/2021 1811  Last data filed at 02/23/2021 2115  Gross per 24 hour   Intake ???   Output 400 ml   Net -400 ml       LABS  BMP:   Lab Results   Component Value Date/Time    NA 132 02/23/2021 10:26 PM    K 3.9 02/23/2021 10:26 PM    K 3.7 02/09/2021 06:40 PM    CL 94 02/23/2021 10:26 PM    CO2 25 02/23/2021 10:26 PM    BUN 26 02/23/2021 10:26 PM    LABALBU 3.5 02/14/2021 06:42 PM    CREATININE 1.0 02/23/2021 10:26 PM    CALCIUM 9.0 02/23/2021 10:26 PM    GFRAA >60 02/23/2021 10:26 PM    LABGLOM 57 02/23/2021 10:26 PM    GLUCOSE 108 02/23/2021 10:26 PM     CBC:   Recent Labs     02/22/21  0439 02/23/21  0735 02/24/21  0530   WBC 7.3 6.2 6.5   HGB 13.9 14.0 14.5   HCT 42.2 41.7 43.8   MCV 88.3 87.8 88.2   PLT 360 354 423     BNP: No results found for: BNP    ECHOCARDIOGRAM: ***    Assessment:     CONSULTS:   IP CONSULT TO HOSPITALIST  IP CONSULT TO HEART FAILURE NURSE/COORDINATOR  IP CONSULT TO DIETITIAN  IP CONSULT TO CARDIOLOGY  IP CONSULT TO SPIRITUAL SERVICES  IP CONSULT TO CARDIOLOGY  IP CONSULT TO CRITICAL CARE  IP CONSULT TO DIETITIAN    Patient has a CARDIOLOGY CONSULT: {yes no N/L:[id number]}      Patient taking an ACEI/ARB/ARNI:  {yes no:315493::"Yes"}       Patient taking a BETA BLOCKER:  {yes no:315493::"Yes"}    Patient taking ALDACTONE:   {yes no:315493::"Yes"}    Patient taking SGLT2:  {yes no:315493::"Yes"}    SCALE AVAILABLE:  {yes no:315493::"Yes"}     EDUCATION STATUS: {HH PATIENT CAREGIVER:109003} ***  []   Provided both written and verbal education on Heart Failure signs/symptoms.  []   Provided instructions on daily medications.   []   Provided instructions to monitor and record weight daily.  []   Provided instructions to call if weight increases 3 lbs in one day or 5 lbs in one week.  []   Received verbal acknowledgment/understanding of Heart Failure related causes.  []   Provided instructions on how to maintain a low sodium diet.   []   Provided recommendations on activity and exercise    []   Provided recommendations for smoking cessation programs   []   Other:        CURRENT DIET: ADULT ORAL NUTRITION SUPPLEMENT; Lunch, Dinner; Frozen Oral Supplement  ADULT DIET; Regular    EDUCATIONAL MATERIALS PROVIDED:    Titles and material given:  {Yes No ***  []   [facility] HF Guide : What is HF; Warning Signs of HF flare; Making Changes to Your Diet; Medicines to Help Your Heart  []   Weight Log; [facility] HF Zone Tool   []   How to Read a Food Label; Sample Daily Low Sodium Menu; Low Sodium Shopping Guide  []   Sodium Content in Foods  []   Fast Food Nutrition Guide   []   WOW ME 2000 phone app  []   When Smokers Quit   []   Sleep Disorders and Your Heart   []  CarePath education material: HF general info; HF zone tool; Low Sodium Diet, Fluid Restrictions  []   Other:        PATIENT/CAREGIVER TEACHING: ***   Level of patient/caregiver understanding able to:   []  Verbalize understanding   []  Demonstrate understanding       []  Teach back        []  Needs reinforcement     []   Other:      TEACHING TIME:  {NUMBERS; 1-100 BY 10:10269} minutes       Plan:       DISCHARGE PLAN:  Placement for patient upon discharge: {[phone]}   Hospice Care:  {no/yes:315556::"no"}  Code Status: Full Code  Discharge appointment  scheduled: {Yes No     RECOMMENDATIONS: ***  []   Encourage to call Heart Failure Resource Line with any questions or concerns.  []   Educate further Ms. Espana's on fluid restriction 48 oz- 64 oz during inpatient stay so she can understand how to measure intake at home.   []   Continue to educate on S/S of Heart Failure.  []   Emphasize daily weights, diet, and if changes, to call Heart Failure Resource Line  []   Cardiac Rehab referral:   []   Sleep Medicine referral:   []  Other:            Electronically signed by , RN, BSN CHFN  on 02/24/2021 at 6:11 PM

## 2021-02-24 NOTE — Other (Signed)
Utilization Reviews    ??    Heart Failure - Care Day 15 (02/23/2021) by May Liu, RN    ??  Review Status Review Entered   Completed 02/24/2021 17:12   ??  Criteria Review      Care Day: 15 Care Date: 02/23/2021 Level of Care: Telemetry    Guideline Day 2    Level Of Care    (X) Floor    02/24/2021 5:12 PM EDT by Verdie Mosher, May    ?? Telemetry    Clinical Status    (X) * Hemodynamic stability    02/24/2021 5:12 PM EDT by Verdie Mosher, May    ?? Temp 98.2  RR ?? 18  PR ?? 86  BP ?? 125/77  SpO2 ?? 93% ra    (X) * Mental status at baseline    02/24/2021 5:12 PM EDT by Verdie Mosher, May    ?? alert, appears stated age and cooperative, No acute distress    (X) * No evidence of myocardial ischemia    (X) * Cardiac rate and rhythm acceptable    02/24/2021 5:12 PM EDT by Verdie Mosher, May    ?? Heart: RRR    (X) * Oxygenation at baseline or improved    02/24/2021 5:12 PM EDT by Verdie Mosher, May    ?? SpO2 ?? 93% ra    (X) * Pulmonary edema absent or improved    02/24/2021 5:12 PM EDT by Verdie Mosher, May    ?? Lungs: , no accessory muscle use, no respiratory distress    Activity    ( ) Advance activity as tolerated    02/24/2021 5:12 PM EDT by Verdie Mosher, May    ?? Strict Bedrest    Routes    (X) Oral diet    02/24/2021 5:12 PM EDT by Verdie Mosher, May    ?? ADULT DIET; Regular    Interventions    (X) * Pulmonary catheter absent    (X) Weigh    02/24/2021 5:12 PM EDT by Verdie Mosher, May    ?? Weight 286 lb 11.2 oz (130 kg)    (X) Possible electrolytes    02/24/2021 5:12 PM EDT by Verdie Mosher, May    ?? Electrolytes monitoring  Sodium 132 L  Chloride 94 ??L    (X) Oxygen    02/24/2021 5:12 PM EDT by Verdie Mosher, May    ?? pt on RA    Medications    (X) Neprilysin inhibitor with ARB, or ACE inhibitor or ARB    02/24/2021 5:12 PM EDT by Verdie Mosher, May    ?? losartan (COZAAR) tablet 12.5 mg  Dose: 12.5 mg  Freq: DAILY Route: PO    (X) Beta-blocker    02/24/2021 5:12 PM EDT by Verdie Mosher, May    ?? metoprolol succinate (TOPROL XL) extended release tablet 25 mg  Dose: 25 mg  Freq: DAILY Route: PO    * Milestone   Additional Notes   DATE: 02/23/2021         Pertinent Updates:    Receiving anticoagulation w/ ELIQUIS tablet 5 mg poamiodarone Antiarrhythmic po med CORDARONE tablet 200 mg   Hyponatremia Na          Abnl/Pertinent Labs/Radiology/Diagnostic Studies:   FL MODIFIED BARIUM SWALLOW W VIDEO    Laryngeal aspiration on thin liquids.       Glucose ?? 108    BUN ?? ?? ??26    GFR Non-African American 51 >> 57         Physical Exam:   Musculoskeletal:  Bilateral lower extremity edema          MD Consults/Assessments & Plans:   ** Cardiology MD Notes   *PLAN   - ??No diuretics today while off supplemental oxygen,    - no IV fluids either.    - We may consider resuming home diuretics: - ??Cw amio 200 bid x 14 days then 200 daily per EP.    -??Cw??Toprol 25 daily   - C/w dig 125 mcg daily   - Started losartan 12.5 mg daily today    -We will consider starting SGLT2 inhibitors    -??Strict I's and O's every shift and standing weights   - Low-salt diet, daily BMP with reflex to Mg    - Wean supplemental oxygen to off (or down to baseline supplemental oxygen requirements) for sats greater than 92-94%.   - Cw eliquis 5 bid.          ** Internal Medicine MD Notes   *PLAN   - IV lasix was transitioned to PO but on hold d/t elevated creatinine   - Enteresto and aldactone on hold d/t elevated creatinine   - Further ischemic work-up per cardiology recs   - EP consulted         ** Electrophysiology Notes   *PLAN   - On Eliquis 5 mg BID, no bleeding, continue   - On amio 200 mg BID x 2 weeks then 200 mg QD   - On Toprol 25 mg QD, dig 125 mcg QD         Medications:   amiodarone (CORDARONE) tablet 200 mg   Dose: 200 mg   Freq: 2 TIMES DAILY Route: PO      apixaban (ELIQUIS) tablet 5 mg   Dose: 5 mg   Freq: 2 TIMES DAILY Route: PO      digoxin (LANOXIN) tablet 125 mcg   Dose: 125 mcg   Freq: DAILY Route: PO      DULoxetine (CYMBALTA) extended release capsule 60 mg   Dose: 60 mg   Freq: DAILY Route: PO      melatonin disintegrating tablet 5 mg   Dose: 5 mg   Freq: NIGHTLY Route: PO      QUEtiapine (SEROQUEL) tablet  12.5 mg   Dose: 12.5 mg   Freq: NIGHTLY Route: PO         Orders:   Cont above meds            ** PT NOTES   AM-PAC Inpatient T-Scale Score : 40.78          ** SLP ??NOTES   Repeat MBS this date. Pt educated on pharyngeal stasis clearing with liquid rinse, benefit of chin tuck to prevent aspiration and appropriate posture. Pt able to verbalize understanding of all information.    Continue goal.         ** CM Assessments or Notes:   Transfer ??Out ot ??ICU ??: ??To [facility]    The original plan was to return home. Will need ??Up dated ??PT/OT [facility] ??Able to accept. Pt's spouse ??to ?? Pre cert today :    Patient is from home with spouse, completely independent pta. ??Her family were provided with choice of provider; she and her family are in agreement with the discharge plan.      ??   ??    Heart Failure - Care Day 8 (02/16/2021) by Aaron Mose    ??  Review Status Review Entered  Completed 02/18/2021 09:31   ??  Criteria Review      Care Day: 8 Care Date: 02/16/2021 Level of Care: ICU    Guideline Day 2    Level Of Care    (X) Floor    02/18/2021 9:28 AM EDT by Aaron Mose    ?? ICU    Clinical Status    ( ) * Hemodynamic stability    02/18/2021 9:28 AM EDT by Aaron Mose    ?? Temp: 98.4 (36.9)  PR: 123  RR: 8  BP: 131/104  MAP: 113  SpO2: 88% 1LNC, 98% 2LNC    ( ) * Mental status at baseline    02/18/2021 9:28 AM EDT by Aaron Mose    ?? Psychiatric: sedated    (X) * No evidence of myocardial ischemia    02/18/2021 9:28 AM EDT by Aaron Mose    ?? none noted    ( ) * Cardiac rate and rhythm acceptable    02/18/2021 9:28 AM EDT by Aaron Mose    ?? Cardiovascular: Tachycardic with normal rhythm with normal S1/S2 without murmurs, rubs or gallops.    (X) * Oxygenation at baseline or improved    02/18/2021 9:28 AM EDT by Aaron Mose    ?? improving; extubated yesterday; on 1-2L nasal cannula    (X) * Pulmonary edema absent or improved    02/18/2021 9:28 AM EDT by Aaron Mose    ?? Respiratory: ??Normal respiratory  effort. Clear to auscultation, bilaterally without Rales/Wheezes/Rhonchi.    Activity    ( ) Advance activity as tolerated    02/18/2021 9:28 AM EDT by Aaron Mose    ?? Strict bedrest    Routes    ( ) Taper parenteral medications    02/18/2021 9:28 AM EDT by Aaron Mose    ?? still with IV meds:  Amiodarone 450mg  CONT IV  Digoxin once IV  KCl every hour IV x5  Heparin units CONT IV  PO meds:  Eliquis 5mg  BID PO x1  Melatonin 5mg  nightly PO  Zolpidem 5mg  once PO    (X) Oral diet    02/18/2021 9:28 AM EDT by    ?? Dysphagia - Soft and Bite Sized; Mildly Thick (Nectar)    Interventions    (X) * Pulmonary catheter absent    02/18/2021 9:28 AM EDT by    ?? absent    (X) Weigh    02/18/2021 9:28 AM EDT by Aaron Mose    ?? 287 lb 11.2 oz (130.5 kg)    (X) Possible electrolytes    02/18/2021 9:31 AM EDT by Aaron Mose    ?? Chloride: 90 (L)    02/18/2021 9:28 AM EDT by Aaron Mose    ?? Sodium: 135 (L)  Potassium: 4.4  Chloride: 91 (L)  Magnesium: 2.50 (H)  CALCIUM, SERUM: 8.5    (X) Oxygen    02/18/2021 9:28 AM EDT by Aaron Mose    ?? on 1-2LNC    Medications    (X) Diuretics    02/18/2021 9:28 AM EDT by Aaron Mose    ?? furosemide (LASIX) 100 mg in dextrose 5 % 100 mL infusion  Rate: 20 mL/hr Dose: 20 mg/hr  Freq: CONTINUOUS Route: IV    (X) Beta-blocker    02/18/2021 9:28 AM EDT by Aaron Mose    ?? metoprolol tartrate (LOPRESSOR) tablet 25 mg  Dose: 25 mg  Freq: 2 TIMES DAILY Route: PO  x1    * Milestone   Additional Notes   DATE: 02/16/2021      Pertinent Updates:   extubated yesterday; on nasal cannula; off Levo; on amnio/Lasix and heparin drip; agitated overnight requiring Zyprexa; aspirated with thin liquids and speech evaluation; dysphagia diet with thickened liquids recommended per speech.      Abnl/Pertinent Labs/Radiology/Diagnostic Studies:      02/16/2021 01:08   Anti-XA Unfrac Heparin: 1.04 (HH)      02/16/2021 09:16   Anti-XA Unfrac Heparin: 0.78 (H)       02/16/2021 04:04   BUN,BUNPL: 31 (H)   Creatinine: 1.3 (H)   GFR Non-African American: 42 (A)   GFR African American: 51 (A)   GLUCOSE, FASTING,GF: 115 (H)      02/16/2021 19:17   BUN,BUNPL: 26 (H)   Creatinine: 1.2 (H)   GFR Non-African American: 46 (A)   GFR African American: 56 (A)   GLUCOSE, FASTING,GF: 130 (H)         WBC: 13.0 (H)   Neutrophils Absolute: 8.7 (H)   Monocytes Absolute: 2.5 (H)      02/16/2021 11:20   FL MODIFIED BARIUM SWALLOW W VIDEO: Rpt   Impression   Aspiration with thin liquids   Please see speech pathology assessment for dietary recommendations.      Physical Exam:   Respiratory: ??Normal respiratory effort. Clear to auscultation, bilaterally without Rales/Wheezes/Rhonchi.   Cardiovascular: Tachycardic with normal rhythm with normal S1/S2 without murmurs, rubs or gallops.   Psychiatric:sedated      MD Consults/Assessments & Plans:   Cardio Notes 1:   Assessment & Plan:    ??   AF   - In AF with HR not well controlled   - S/p RFA of AFL w/ pAF noted   - On amio gtt   - Received dig x 1 (02/14/2021)   - Was given metoprolol 5 mg IV x 1 overnight   - Keep K+ > 4.0 and Mg > 2.0   - Reviewed recent labs   - On heparin gtt    - OAC on hold   - Echo - EF < 20%, sev LV dilatation, grade III DD, RV systolic function sev reduced   - Will discuss plan of care with Dr. Larwance SachsLaks   ??   CMP   - EF <20%   - NYHA class II/III   - QRS 104   - BB on hold - will restart when BP improves post extubation   - Cr improving - now 1.3   - Ischemic w/u when    - Dr. Harrison MonsBlake following      Cardio Notes 2:   Assessment & Plan:   ??   1. ??Acute and new HFrEF. ??It is of unknown etiology, could be ischemic (severe CAD) versus nonischemic (PAF RVR, less likely??postchemotherapy, viral). ??Patient is volume overloaded and hypotensive requiring pressor support.    2. ??Non-Hodgkin's lymphoma status post chemotherapy, now in remission   3. ??SLE   4. ??Rheumatoid arthritis   5. ??PAFL RVR. ??Patient appears to be in atrial flutter/coarse  atrial fibrillation with RVR. ??It is of unknown duration and she has not been on any anticoagulation. ??She was minimally symptomatic (minimal palpitations). Patient was started on anticoagulation and EP consulted; patient had AFL ablation on 6/24, remained in sinus post procedure.   6. ??SVT 6/26, now in sinus after dig bolus on amio drip.    ????   - Increase Lasix 20 mg/h, resume  spiro 50 daily   - Continue with amiodarone gtt; will not change to p.o. amiodarone until patient able to tolerate low-dose Lopressor and rhythm stable sinus on telemetry x 24 hours.    - Will resume lopressor 25 bid   - Will give one dose of dig 125 mcg IV now.    - Strict I's and O's every shift and standing weights if possible, low-salt diet, daily BMP with reflex to Mg (correct lytes for goals K >4.0 and Mg > 2.0) and wean supplemental oxygen to off (or down to baseline supplemental oxygen requirements) for sats greater than 92-94%.   - Patient was on heparin gtt for PAFL s/p ablation; will change to eliquis 5 bid.    - EP following.    ??   IM Notes:   Assessment/Plan:   ??   Acute Exacerbation of New-Onset HFrEF??   Cardiogenic shock   Recent ECHO on 6/17 with severe dilation of LV, EF of <20%, severe diffuse hypokinesis and grade 3 diastolic dysfunction.   - Off Milrinone since 6/25. Off Levophed   - On Lasix gtt;    -??Holding??Toprol XL ER 25 mg PO daily   - Requires ischemic w/u, consider when euvolemic??   - Cards following - appreciate recs   ??   Acute hypoxic respiratory failure 2/2 acute HFrEF s/p a flutter ablation (6/24)   - S/p extubation 02/15/2021. ??   - Off sedation   - On 2 L nasal cannula    - Monitor sats, wean oxygen as tolerated to keep sats more than 90%   ??   New-Onset Atrial Flutter??s/p ablation??(6/24)??   -??S/p ablation w/ EP 6/24   - On Amiodarone/heparin gtt   -??Holding??Toprol XL ER 25 mg PO daily   - Cards??and EP??c/s - reccs appreciated   ??   AKI   - Likely 2/2 hypoperfusion due to cardiogenic shock   - Avoid  nephrotoxic medications   - Monitor creatinine, creatinine 1.3 today   ??   Chronic Medical Conditions:   SLE -??Holding??Plaquenil 400 mg PO daily, Cymbalta 60 mg PO daily as NPO   OA - Holding Relafen   Morbid obesity BMI 40, complicates overall care   Non-Hodgkin's lymphoma status postchemo 2006-2007   ??   DVT Prophylaxis: Eliquis   Diet: ADULT DIET; Dysphagia - Soft and Bite Sized; Mildly Thick (Nectar)   Code Status: Full Code      Medications:   ipratropium-albuterol (DUONEB) nebulizer solution 1 ampule   Dose: 1 ampule   Freq: EVERY 4 HOURS WHILE AWAKE Route: IN      sodium chloride (Inhalant) 3 % nebulizer solution 4 mL   Dose: 4 mL   Freq: EVERY 4 HOURS WHILE AWAKE Route: NEBULIZATION      Orders:   Anti-Xa, Unfractionated Heparin    Magnesium    CBC with Auto Differential    BMP   FL MODIFIED BARIUM SWALLOW W VIDEO   Consult to Cardio and IM      PT/OT/SLP/CM Assessments or Notes:   SLP Notes:   MODIFIED BARIUM SWALLOW   Impressions:   Pt presents with mild oral/pharyngeal phase dysphagia   Oral-pt had no difficulty with A-P transport of the bolus with any consistency. ??pt functional mastication with solids but there is a concern for fatigue given that pt is deconditioned. Pt with intermittent premature spillage over the base of the tongue with thin by cup   Pharyngeal- suspect impaired subglottic pressure given weak vocal  quality along with impaired tongue base retraction and reduced hyolaryngeal excursion which resulted in aspiration of thin by cup with a cough reflex with first trial. ??Cough was strong but not adequate enough to clear the material from the airway. With subsequent trials with thin by cup and straw, pt with deep penetration to the cords with spontaneous clearing. Attempted bolus hold and chin tuck strategies but this did not aid in airway protection. With trials of nectar by cup and straw, shallow/flash penetration with 100% spontaneous clearing was noted. With trials with pudding and  cracker, there was minimal residue remaining in the valleculae after the swallow which was cleared with a liquid wash. Although there was only one instance of aspiration with thin liquids, there were multiple instances of deep penetration placing pt at risk for aspiration. Given pt's aphonia and recent extubation, recommend dysphagia III/soft and bite sized diet with nectar/mildly thick liquids at this time. Will continue to assess for diet upgrade in the days to come. ??      Recommended diet: Dysphagia III/soft and bite sized with nectar/mildly thick liquids-Make NPO if s/s aspiration emerge and alert SLP

## 2021-02-24 NOTE — Progress Notes (Addendum)
Hospitalist Progress Note      PCP: Lindell Spar, APRN - CNP    Date of Admission: 02/09/2021    Chief Complaint: Shortness of breath     Hospital Course: see H&P     Subjective:   Cough comes and goes, other than that feels pretty, wants to go home.     Medications:  Reviewed    Infusion Medications   ??? sodium chloride     ??? dextrose       Scheduled Medications   ??? furosemide  40 mg Oral BID   ??? losartan  12.5 mg Oral Daily   ??? metoprolol succinate  25 mg Oral Daily   ??? spironolactone  25 mg Oral Daily   ??? QUEtiapine  12.5 mg Oral Nightly   ??? digoxin  125 mcg Oral Daily   ??? amiodarone  200 mg Oral BID   ??? [START ON 03/03/2021] amiodarone  200 mg Oral Daily   ??? melatonin  5 mg Oral Nightly   ??? sodium chloride flush  5-40 mL IntraVENous 2 times per day   ??? apixaban  5 mg Oral BID   ??? DULoxetine  60 mg Oral Daily   ??? [Held by provider] hydroxychloroquine  400 mg Oral Daily     PRN Meds: diphenhydrAMINE, diphenhydrAMINE, ipratropium-albuterol, sodium chloride (Inhalant), heparin (porcine), heparin (porcine), phenol, sodium chloride flush, sodium chloride, acetaminophen, glucose, dextrose bolus **OR** dextrose bolus, glucagon (rDNA), dextrose, hydrOXYzine HCl, albuterol, ondansetron **OR** ondansetron, polyethylene glycol      Intake/Output Summary (Last 24 hours) at 02/24/2021 1323  Last data filed at 02/23/2021 2115  Gross per 24 hour   Intake 250 ml   Output 1050 ml   Net -800 ml       Physical Exam Performed:    BP 108/77    Pulse 82    Temp 97.8 ??F (36.6 ??C) (Oral)    Resp 20    Ht 5\' 11"  (1.803 m)    Wt 286 lb 12.8 oz (130.1 kg)    SpO2 99%    BMI 40.00 kg/m??     General appearance: No apparent distress  HEENT: Pupils equal, round, and reactive to light. Conjunctivae/corneas clear.  Neck: Supple, with full range of motion. No jugular venous distention. Trachea midline.  Respiratory:  Normal respiratory effort. Clear to auscultation, bilaterally without Rales/Wheezes/Rhonchi.  Cardiovascular: Heart rate  normal with normal rhythm with normal S1/S2 without murmurs, rubs or gallops.  Abdomen: Soft, non-tender, non-distended with normal bowel sounds.  Musculoskeletal: Bilateral lower extremity edema improved  Skin: Skin color, texture, turgor normal.  No rashes or lesions.  Neurologic:  Grossly non-focal.  Capillary Refill: Brisk,3 seconds, normal   Peripheral Pulses: +2 palpable, equal bilaterally       Labs:   Recent Labs     02/22/21  0439 02/23/21  0735 02/24/21  0530   WBC 7.3 6.2 6.5   HGB 13.9 14.0 14.5   HCT 42.2 41.7 43.8   PLT 360 354 423     Recent Labs     02/22/21  1845 02/23/21  0735 02/23/21  2226   NA 128* 132* 132*   K 3.8 3.6 3.9   CL 90* 94* 94*   CO2 28 29 25    BUN 29* 26* 26*   CREATININE 1.2* 1.1 1.0   CALCIUM 9.0 9.0 9.0     No results for input(s): AST, ALT, BILIDIR, BILITOT, ALKPHOS in the last 72 hours.  No results  for input(s): INR in the last 72 hours.  No results for input(s): CKTOTAL, TROPONINI in the last 72 hours.    Urinalysis:      Lab Results   Component Value Date/Time    NITRU Negative 02/09/2021 07:13 PM    WBCUA 3-5 02/09/2021 07:13 PM    BACTERIA RARE 04/19/2017 11:01 AM    RBCUA 3-4 02/09/2021 07:13 PM    BLOODU Negative 02/09/2021 07:13 PM    SPECGRAV 1.015 02/09/2021 07:13 PM    GLUCOSEU Negative 02/09/2021 07:13 PM       Radiology:  Marylene Buerger MODIFIED BARIUM SWALLOW W VIDEO   Final Result      Laryngeal aspiration on thin liquids.      Please refer to the detailed report of the speech therapist.            FL MODIFIED BARIUM SWALLOW W VIDEO   Final Result      Aspiration with thin and nectar thick liquids. Please see separate same-day dictation for more dedicated speech pathology findings.            FL MODIFIED BARIUM SWALLOW W VIDEO   Final Result      Aspiration with thin liquids      Please see speech pathology assessment for dietary recommendations.      XR CHEST PORTABLE   Final Result      Mild left basilar airspace disease.      XR CHEST PORTABLE   Final Result      1.  Endotracheal tube in stable position. Right IJ central venous catheter placed with the tip in the distal SVC in satisfactory position   2. Stable cardiac enlargement with no acute segmental consolidation               XR CHEST PORTABLE   Final Result   Impression:       Tip of endotracheal tube is 5 cm the carina.      Lungs are clear. No pneumothorax.      CT CHEST PULMONARY EMBOLISM W CONTRAST   Final Result      1. No evidence of any pulmonary embolism.   2. Four-chamber cardiac enlargement with reflux of contrast into the hepatic veins. This can be seen with elevated right heart pressures.   3. No acute pulmonary process.      XR CHEST PORTABLE   Final Result      Stable cardiac enlargement      No acute consolidation                Assessment/Plan:    Active Hospital Problems    Diagnosis    ??? Atrial fibrillation and flutter (HCC) [I48.91, I48.92]      Priority: Medium   ??? Paroxysmal atrial fibrillation (HCC) [I48.0]      Priority: Medium   ??? Atrial flutter with rapid ventricular response (HCC) [I48.92]      Priority: Medium   ??? Dilated cardiomyopathy (HCC) [I42.0]      Priority: Medium   ??? Acute respiratory failure with hypoxia (HCC) [J96.01]      Priority: Medium   ??? Lactic acidosis [E87.2]      Priority: Medium   ??? CHF (congestive heart failure), NYHA class I, acute on chronic, combined (HCC) [I50.43]      Priority: Medium   ??? Shortness of breath [R06.02]      Priority: Medium     60 yo female with a PMH of NHL, SLE  who presented to the ED with chief complaints of cough and shortness of breath for the past 6 weeks that got worse recently in the past few days  ??  Acute Exacerbation of New-Onset HFrEF   Cardiogenic shock  Possibly ischemic vs nonischemic/viral/post-chemo   Recent ECHO on 6/17 with severe dilation of LV, EF of <20%, severe diffuse hypokinesis and grade 3 diastolic dysfunction.  - Off Milrinone since 6/25. Off Levophed  - IV lasix was transitioned to PO but on hold d/t elevated creatinine  -  Enteresto and aldactone on hold d/t elevated creatinine  - Further assessment, ischemic work-up per cardiology    Acute hypoxic respiratory failure 2/2 acute HFrEF s/p a flutter ablation (6/24)  - S/p extubation 02/15/2021  - Off sedation  - Off oxygen  - Resolved  ??  New-Onset Atrial Flutter??s/p ablation??(6/24)??  -??S/p ablation w/ EP 6/24  - Status post heparin drip.  Transition to Eliquis twice daily 06/28  - Received a dose of digoxin 125 mcgX1 and lopressor resumed 06/28  - Cont Po digoxin.   - Lopressor changed to toprol.  Dose decreased to 25 mg daily due to hypotension. Cont toprol  - Off Amiodarone gtt and on Po amio now  - Cardiology following, appreciate recommendation    AKI  - Likely 2/2 hypoperfusion due to cardiogenic shock  - Avoid nephrotoxic medications  - Holding lasix, aldactone and enteresto  - S/p IVF  - Monitor creatinine, creatinine 1.3 today    Insomnia  - Patient has been unable to sleep even after doses of melatonin and Ambien  - Seroquel 25 mg nightly was started, reduced to 12.5 mg nightly  - Cont seroquel 12.5 mg nightly    Anxiety  - Continue cymbalta 60 mg PO daily     SLE   - Holding Plaquenil 400 mg PO daily    OA   - Holding Relafen    COVID positive  - Repeat COVID test was negative but PCR positive  - Continue supportive care    Morbid obesity BMI 40, complicates overall care    Non-Hodgkin's lymphoma status postchemo 2006-2007    DVT Prophylaxis: Eliquis  Diet: ADULT ORAL NUTRITION SUPPLEMENT; Lunch, Dinner; Frozen Oral Supplement  ADULT DIET; Regular  Code Status: Full Code    PT/OT Eval Status: Ordered    Dispo - Pt improving, needs fitting for life-vest which will arrange for today hopefully    Rosey Bath, DO

## 2021-02-24 NOTE — Discharge Instructions (Signed)
Please get your blood chemistry test done at a [facility] lab before your appointment with cardiology    Check your weight when you get home and then check it every day. If weight goes up more than 3 pounds contact the cardiology office.     You have been started on new blood pressure medications. If you are having any low blood pressures in the 90s (top number), contact the cardiology office for further instructions.     Check your blood pressures at home twice daily every day after at last 5 minutes at rest with feet on the ground and back supported. Bring your cuff to the doctors office to compare. Call your primary care or cardiologist tomorrow to make an appointment for hospital follow-up (or see them as already scheduled).         Extra Heart Failure sites:   Http://www.ksw-gtg.com/aha-heartfailure --- this is American Heart Association interactive Healthier Living with Heart Failure guidebook.  Please copy and paste link into search bar. Use your mouse to scroll through the pages.  Lots and lots of info / tips    WOW ME 2000 --- free smart phone app- (icon will look like a lopsided pink heart) --Use your phone to track sodium / fluid intake, symptoms, weight, etc.    [employer] - website-- [employer] is a dialysis company.  All dialysis patients follow a "renal" diet which IS low sodium!! This website offers free seasonal cookbooks.  Each quarter, they will release 25-30 new recipes with a breakdown of calories, sodium, glucose, etc.    SearchEngineCritic.be -- more free recipes        [facility]  Electrophysiology  Dr. Guadelupe Sabin  Ferdinand Lango CNP  Everlene Farrier CNP  Knox Royalty, RN  [phone]    Cardiac Catheter Ablation  Discharge Instructions      WHAT YOU SHOULD KNOW:    Your heart has a special electrical system built into it that controls your heart rhythm. Sometimes there is a problem with this electrical system in the heart muscle. This problem may cause an arrhythmia  (ah-RITH-mee-ah), or abnormal heart rhythm. If medicine does not correct the problem, or if you do not wish to take medicines long-term, you may need a cardiac ablation (ab-LAY-shun). An ablation may also be called a catheter ablation, or a radiofrequency ablation.     An ablation procedure is usually done at the same time as an electrophysiology study. This test is used to "map out" the electrical pathways in your heart that control your heart rhythm. This test helps your doctor find the exact spot where the ablation needs to be done. During an ablation, energy is sent through a special catheter to the area of your heart that has the electrical problem. This energy causes a tiny area of the heart muscle to scar, stopping the electrical problem and allowing your heart to beat regularly. Ask your caregiver for more information about your heart problem, and tests and treatments that may be done for it.     AFTER YOU LEAVE:     Home care:    Activity: After your ablation, rest for one to two days. If the ablation catheter was put in your groin, avoid using stairs for a few days. When you must use stairs, step up with the leg that was not used for the ablation. Straighten this leg to move the other leg up to the next step without putting stress on it.     Bathing and  catheter site care:      You may need to keep the catheter site clean and dry for at least 24 hours after your ablation. Ask your caregiver if you may shower 24 hours after your ablation. (You may need to wait up to a week to take a tub bath, swim, or soak in water.) Once it is OK to shower, gently wash the catheter site with soap and water. If the site is oozing or bleeding slightly, place a small bandage over it to protect your clothes. Change the bandage daily, and more often if it gets wet or dirty. Once the site has stopped oozing, you may leave it uncovered. Do not lift anything weighing over ten pounds for five days after the ablation.     It is  normal to have a bruise and soreness where the ablation catheter went in. Draw a line with a pen around the edges of the bruise. This will show you if the bruise starts to get bigger. Gravity may cause the bruise color to travel down your leg. If this happens, call your caregiver.      If the catheter site starts to bleed, use your hand to put pressure on the bandage. If you do not have a bandage, use a clean cloth to hold pressure over and just above the puncture site. It is better if someone else holds pressure for you. While holding pressure, call your caregiver. Hold the pressure for 30 minutes, even after the bleeding has stopped. After the bleeding has stopped, lie flat for at least an hour. If the bleeding does not stop within 15 minutes of holding pressure, go to the nearest hospital or clinic. Do not walk, and do not drive yourself. If you are bleeding a lot, dial 911 or 0 (operator) to call an ambulance.    Do not strain while having a bowel movement. If you are constipated, take an over the counter stool softener such as Metamucil or Citracel.      Food and liquids: You may eat your usual diet when you get home. Drink plenty of liquids. Most people should drink at least eight (8-ounce) glasses of water each day. Limit the amount of caffeine you drink such as coffee, tea, and soda. Do not drink alcohol for 24 hours after the test. Follow your caregiver's advice if you need to change the amount of liquids that you drink.     CONTACT A CAREGIVER IF:    You have a fever (increased body temperature).      The catheter site becomes red, or has pus or foul-smelling drainage coming from it. This may mean it is infected.     You have increasing pain at the catheter site. (It is normal to have some soreness, but this should get better, not worse.)     You have questions or concerns about your ablation, medicines, or health condition.     SEEK CARE IMMEDIATELY IF:    Your leg or arm becomes numb (loses  feeling), is very painful, or changes color.     The bruise at the catheter site starts to get bigger or the area has new swelling.     If you have any of the following, it is an emergency ! Call 911 or 0 (operator) to get to the nearest hospital or clinic. Do not drive yourself!     The ablation catheter site is bleeding a lot or you cannot stop the bleeding.  You have signs of a stroke (brain attack). Having new weakness or trouble moving one side of your face or body may be signs of a stroke. Other signs include new trouble thinking or speaking clearly, and new changes in vision.     Medicines:    Keep a list of your medicines: Keep a written list of the medicines you take, the amounts, and when and why you take them. Bring the list of your medicines or the pill bottles when you see your caregivers. Do not take any medicines, over-the-counter drugs, vitamins, herbs, or food supplements without first talking to caregivers.      Take your medicine as directed: Always take your medicine as directed by caregivers. Call your caregiver if you think your medicines are not helping or if you feel you are having side effects. Do not quit taking your medicines until you discuss it with your caregiver.      Aspirin or blood thinners: Your caregiver may want you to take aspirin or another blood thinner for 2 to 4 weeks after your ablation. This is to keep blood clots from forming in your heart. Do not take more or less medicine than caregivers say to take. If caregivers tell you to take aspirin, do not take acetaminophen or ibuprofen instead.     Over-the-counter pain medicine: You may use over-the-counter (OTC) pain medicines, such as ibuprofen or acetaminophen, for pain or soreness. These medicines are safe for most people to use. However, they can cause serious problems when they are not used correctly. People with certain medical conditions, or using certain other medicines are at a higher risk for problems.  Using too much, or using these medicines for longer than the label says can also cause problems. Follow directions on the label carefully. If you have questions, talk to your caregiver.      Ask your caregiver when to return for a follow-up visit. Keep all appointments. Write down any questions you may have. This way you will remember to ask these questions during your next visit.        FOLLOW-UP APPOINTMENTS    Follow-up with  NP in 1 month  [address] Phone: [phone].    If you are unable to make this appointment, please call to reschedule.

## 2021-02-24 NOTE — Progress Notes (Signed)
Electrophysiology - PROGRESS NOTE    Admit Date: 02/09/2021     Chief Complaint: AF/AFL/RVR     Interval History:   Patient seen and examined and notes reviewed. Patient is being followed for AF/AFL RVR. Pt remains in NSR on tele. No evidence of AF seen overnight. HR controlled. Remains on amio and digoxin. Pt denies palpitations, heart racing, dizziness, lightheadedness. No CP, SOB, PND, orthopnea, BLE edema. BP soft at times. She is wanting to know if she actually needs to go to rehab or if she can go home with assistance. States she has someone that can be with her 24/7.    In: 250 [P.O.:250]  Out: 1050    Wt Readings from Last 2 Encounters:   02/24/21 286 lb 12.8 oz (130.1 kg)   02/01/21 (!) 336 lb (152.4 kg)         Data:   Scheduled Meds:   Scheduled Meds:  ??? furosemide  40 mg Oral BID   ??? losartan  12.5 mg Oral Daily   ??? metoprolol succinate  25 mg Oral Daily   ??? spironolactone  25 mg Oral Daily   ??? QUEtiapine  12.5 mg Oral Nightly   ??? digoxin  125 mcg Oral Daily   ??? amiodarone  200 mg Oral BID   ??? [START ON 03/03/2021] amiodarone  200 mg Oral Daily   ??? melatonin  5 mg Oral Nightly   ??? sodium chloride flush  5-40 mL IntraVENous 2 times per day   ??? apixaban  5 mg Oral BID   ??? DULoxetine  60 mg Oral Daily   ??? [Held by provider] hydroxychloroquine  400 mg Oral Daily     Continuous Infusions:  ??? sodium chloride     ??? dextrose       PRN Meds:.diphenhydrAMINE, diphenhydrAMINE, ipratropium-albuterol, sodium chloride (Inhalant), heparin (porcine), heparin (porcine), phenol, sodium chloride flush, sodium chloride, acetaminophen, glucose, dextrose bolus **OR** dextrose bolus, glucagon (rDNA), dextrose, hydrOXYzine HCl, albuterol, ondansetron **OR** ondansetron, polyethylene glycol  Continuous Infusions:  ??? sodium chloride     ??? dextrose         Intake/Output Summary (Last 24 hours) at 02/24/2021 1104  Last data filed at 02/23/2021 2115  Gross per 24 hour   Intake 250 ml    Output 1050 ml   Net -800 ml       CBC:   Lab Results   Component Value Date/Time    WBC 6.5 02/24/2021 05:30 AM    HGB 14.5 02/24/2021 05:30 AM    PLT 423 02/24/2021 05:30 AM     BMP:  Lab Results   Component Value Date/Time    NA 132 02/23/2021 10:26 PM    K 3.9 02/23/2021 10:26 PM    K 3.7 02/09/2021 06:40 PM    CL 94 02/23/2021 10:26 PM    CO2 25 02/23/2021 10:26 PM    BUN 26 02/23/2021 10:26 PM    CREATININE 1.0 02/23/2021 10:26 PM    GLUCOSE 108 02/23/2021 10:26 PM     INR:   Lab Results   Component Value Date/Time    INR 1.23 02/15/2021 11:17 AM    INR 1.27 02/10/2021 10:37 AM    INR 1.04 10/25/2016 11:37 AM        CARDIAC LABS  ENZYMES:No results for input(s): CKMB, CKMBINDEX, TROPONINI in the last 72 hours.    Invalid input(s): CKTOTAL;3  FASTING LIPID PANEL:  Lab Results   Component Value Date/Time    HDL  33 02/10/2021 05:22 AM    LDLCALC 72 02/10/2021 05:22 AM    TRIG 82 02/10/2021 05:22 AM     LIVER PROFILE:  Lab Results   Component Value Date/Time    AST 86 02/12/2021 03:23 PM    AST 61 01/29/2021 12:16 PM    ALT 79 02/12/2021 03:23 PM    ALT 78 01/29/2021 12:16 PM       -----------------------------------------------------------------  Telemetry: Personally reviewed  NSR    Objective:   Vitals: BP 108/77    Pulse 82    Temp 97.8 ??F (36.6 ??C) (Oral)    Resp 20    Ht 5\' 11"  (1.803 m)    Wt 286 lb 12.8 oz (130.1 kg)    SpO2 99%    BMI 40.00 kg/m??   General appearance: alert, appears stated age and cooperative, No acute distress   Skin: Skin color, texture, turgor normal. No rashes or ecchymosis.  Neck: no JVD, supple, symmetrical, trachea midline   Lungs: , no accessory muscle use, no respiratory distress  Heart: RRR  Extremities: No edema, DP +  Psychiatric: normal insight and affect    Patient Active Problem List:     History of non-Hodgkin's lymphoma     Impaired glucose tolerance     HLD (hyperlipidemia)     Vitamin D deficiency     Morbid obesity with BMI of 40.0-44.9, adult (HCC)     Systemic  lupus erythematosus (HCC)     High risk medication use     Status post total left knee replacement     Seasonal allergies     Acute bacterial sinusitis     Shortness of breath     Acute on chronic congestive heart failure (HCC)     CHF (congestive heart failure), NYHA class I, acute on chronic, combined (HCC)     Atrial flutter with rapid ventricular response (HCC)     Dilated cardiomyopathy (HCC)     Acute respiratory failure with hypoxia (HCC)     Lactic acidosis     Paroxysmal atrial fibrillation (HCC)     Atrial fibrillation and flutter (HCC)        Assessment & Plan:    1. AFL /RVR  2. AF  3. CMP  4. Acute HFrEF  5. Covid +    Molly [name] Is a 60 y.o. woman w/ PMH non-hodgkins lymphoma, s/p radiation, lupus, COVID who p/w SOB, Echo showed newly reduced EF 20%, found to be in AFL with poorly controlled ventricular rate , s/p RFA of AFL (02/12/21, Dr. 02/14/21) became hypoxic post procedure and was unable to be intubated, developed AF, placed on amio gtt, extubated 02/15/21, noted to be covid + (02/18/21)    AF/AFL  - In NSR  - S/p RFA of AFL w/ pAF noted  - CHADSVASc at least 2  - On Eliquis 5 mg BID, no bleeding, continue  - On amio 200 mg BID x 2 weeks then 200 mg QD  - On Toprol 25 mg QD, dig 125 mcg QD    CMP  - EF <20%  - NYHA II/III  - QRS 104  - On Toprol 25 mg QD, losartan 12.5 mg QD  - spironolactone and lasix on hold d/t AKI  - Cr improved  - Dr. 02/20/21 following      Electronically signed by Harrison Mons, APRN - CNP    [facility]

## 2021-02-24 NOTE — Plan of Care (Signed)
Problem: Discharge Planning  Goal: Discharge to home or other facility with appropriate resources  Outcome: Progressing     Problem: Chronic Conditions and Co-morbidities  Goal: Patient's chronic conditions and co-morbidity symptoms are monitored and maintained or improved  Outcome: Progressing     Problem: Safety - Adult  Goal: Free from fall injury  Outcome: Progressing

## 2021-02-24 NOTE — Progress Notes (Signed)
Was reviewed.  There were no overnight events.  Telemetry continues to show normal sinus rhythm.  Creatinine improving at 1.1 (baseline).  Patient remains off supplemental oxygen.  Sodium improving at 132, I's and O's -2 L, off diuretics.    Assessment & Plan:   ??  1. ??Acute and new HFrEF. ??It is of unknown etiology, could be ischemic (severe CAD) versus nonischemic (PAF RVR, less likely??postchemotherapy, viral). ??Patient is now euvolemic but hemo tenuous.   2. ??Non-Hodgkin's lymphoma status post chemotherapy, now in remission  3. ??SLE  4. ??Rheumatoid arthritis  5. ??PAFL RVR. ??Patient appears to be in atrial flutter/coarse atrial fibrillation with RVR. ??It is of unknown duration and she has not been on any anticoagulation. ??She was minimally symptomatic (minimal palpitations). Patient was started on anticoagulation and EP consulted; patient had AFL ablation on 6/24, remained in sinus post procedure.  6.  SVT 6/26, now in sinus after dig bolus on amio drip.   7. COVID infection  ??  ??  ??  -  No diuretics today while off supplemental oxygen, no IV fluids either. We may consider resuming home diuretics: She was on Lasix 40 mg daily and spironolactone 25 mg daily  -  Cw amio 200 bid x 14 days then 200 daily per EP.   -  Cw Toprol 25 daily  - C/w dig 125 mcg daily  -Started losartan 12.5 mg daily today with holding parameters (would not restart Entresto in view of low normal BP.  -We will consider starting SGLT2 inhibitors for patient's HFrEF later in the clinic.   - Strict I's and O's every shift and standing weights if possible, low-salt diet, daily BMP with reflex to Mg (correct lytes for goals K >4.0 and Mg > 2.0) and wean supplemental oxygen to off (or down to baseline supplemental oxygen requirements) for sats greater than 92-94%.  - Cw eliquis 5 bid.   - EP following.   - If LVEF remains < 35% after 90 days of GDMT, will discuss referral to EP for possible device placement. In the meantime, patient will need a  LifeVest (as outpatient).     ??    Patient may be discharged home today on the above meds and follow up with me in 1 week with a BMP prior to visit. Please call me with any questions.         I would like to thank you for providing me the opportunity to participate in the care of your patient. If you have any questions, please do not hesitate to contact me.     Laurette Schimke, MD, [facility], FHFSA  [facility]  [address]  [address] [place] [address]  Ph: [phone]  Fax: [phone]

## 2021-02-24 NOTE — Progress Notes (Signed)
Speech Language Pathology  Facility/Department: [facility] SOUTH TELEMETRY  Dysphagia Daily Treatment Note    NAME: Molly [name]  DOB: 06-Jan-1961  MRN: [id number]    Patient Diagnosis(es):   Patient Active Problem List    Diagnosis Date Noted   ??? Systemic lupus erythematosus ([place]) 09/22/2014   ??? Atrial fibrillation and flutter ([place])    ??? Paroxysmal atrial fibrillation ([facility])    ??? Atrial flutter with rapid ventricular response ([place])    ??? Dilated cardiomyopathy ([place])    ??? Acute respiratory failure with hypoxia ([place])    ??? Lactic acidosis    ??? CHF (congestive heart failure), NYHA class I, acute on chronic, combined ([place]) 02/09/2021   ??? Acute on chronic congestive heart failure ([place]) 02/01/2021   ??? Shortness of breath 01/29/2021   ??? Acute bacterial sinusitis 12/30/2020   ??? Seasonal allergies 05/08/2019   ??? 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 High risk medication use 01/25/2016   ??? Morbid obesity with BMI of 40.0-44.9, adult ([place]) 02/07/2014   ??? Vitamin D deficiency 09/19/2013   ??? Impaired glucose tolerance 02/06/2013   ??? HLD (hyperlipidemia) 02/06/2013   ??? History of non-Hodgkin's lymphoma      Allergies:   Allergies   Allergen Reactions   ??? Sulfa Antibiotics Rash     Recent Chest Xray 02/15/21  Mild left basilar airspace disease.    Chart reviewed.    Medical Diagnosis: Shortness of breath [R06.02]  CHF (congestive heart failure), NYHA class I, acute on chronic, combined ([place]) [I50.43]  Congestive heart failure, unspecified HF chronicity, unspecified heart failure type ([place]) [I50.9]   Treatment Diagnosis: Dysphagia    BSE Impression (02/16/21)-  Pt intubated 6/24 and self extubated 6/27. Pt alert and oriented but with weak, mostly aphonic voice. pt was assessed with trials with ice chips, water by cup and straw and applesauce. ROM of oral structures was [facility]  Pt had no difficulty closing lips around spoon or drawing liquid up a straw.  Pt had no difficulty with mastication with ice chips.  There was  no anterior spillage or oral residue noted with any consistency. Pt had difficulty with initiating a dry swallow but had no difficulty with PO trials. Laryngeal movement noted with all trials. Pt noted to produce a strong reactive cough following first trial of water when consumed successive swallows of water by cup.  Provided pt with additional trials of water by cup and straw through out the session, but no other incidences of coughing or throat clearing noted, even when consumed 3 oz of water continuously. No s/s aspiration with 3 trials of applesauce. Given impaired vocal quality and recent extubation, recommend instrumental assessment prior to PO diet.   Dysphagia Diagnosis: Suspected needs further assessment    Initial MBS results (02/16/21)-  Pt presents with mild oral/pharyngeal phase dysphagia  Oral-pt had no difficulty with A-P transport of the bolus with any consistency.  pt functional mastication with solids but there is a concern for fatigue given that pt is deconditioned. Pt with intermittent premature spillage over the base of the tongue with thin by cup  Pharyngeal- suspect impaired subglottic pressure given weak vocal quality along with impaired tongue base retraction and reduced hyolaryngeal excursion which resulted in aspiration of thin by cup with a cough reflex with first trial.  Cough was strong but not adequate enough to clear the material from the airway. With subsequent trials with thin by cup and straw, pt with deep  penetration to the cords with spontaneous clearing. Attempted bolus hold and chin tuck strategies but this did not aid in airway protection. With trials of nectar by cup and straw, shallow/flash penetration with 100% spontaneous clearing was noted. With trials with pudding and cracker, there was minimal residue remaining in the valleculae after the swallow which was cleared with a liquid wash. Although there was only one instance of aspiration with thin liquids, there were  multiple instances of deep penetration placing pt at risk for aspiration. Given pt's aphonia and recent extubation, recommend dysphagia III/soft and bite sized diet with nectar/mildly thick liquids at this time. Will continue to assess for diet upgrade in the days to come.     Repeat MBS results (02/18/2021)  Oral phase: mild oral dysphagia characterized by slowed oral prep and A-P transit. Mild oral stasis.  ??  Pharyngeal phase: moderate pharyngeal dysphagia characterized by issues with sensation, timing, and hyolaryngeal mechanics resulting in deep penetration and tracheal aspiration of thin liquids and mildly thick liquids (inconsistently silent); attempted cup vs straw vs chin tuck; strategies not effective. Improved coordination for moderately thick liquids and solid PO, with adequate timeliness of epiglottic retraction, laryngeal excursion, and airway protection, with no further aspiration or penetration visualized. Mild pharyngeal stasis, clearing with subsequent swallows.      Most recent MBS: (02/24/2021)  Oral Phase: Pt with adequate labial seal without anterior loss of liquid. Appropriate bolus control with cued bolus hold. Noted premature spillage of other liquid trials into pharynx prior to swallow. Timely and coordinated mastication of regular texture solid. No significant oral stasis.  ??  Pharyngeal Phase: Swallow initiation delayed with bolus in pyriform sinus at times. Complete velar elevation. Decreased laryngeal elevation and hyoid excursion resulting in incomplete airway closure. Patient demonstrated trace-min amount of deep penetration of thin liquid via cup and straw which was aspirated during subsequent trials. Delayed coughing observed in response to aspiration. Chin tuck improved airway closure and patient demonstrated only trace flash penetration of thin liquids. Trace penetration of nectar thick liquids occurred with head in neutral position which did not clear with completion of the  swallow. No penetration or aspiration of honey thick liquid, puree or regular texture solids. Reduced base of tongue retraction resulted in moderate amount of vallecular stasis, clearing with thin liquid rinse. Adequate pharyngeal stripping wave and PES opening.  ??  Upper Esophageal function: Unremarkable    Pain: None expressed    Current Diet : ADULT ORAL NUTRITION SUPPLEMENT; Lunch, Dinner; Frozen Oral Supplement  ADULT DIET; Regular   Recommended Form of Meds:  (TBD following MBS)  Compensatory Swallowing Strategies : Eat/Feed slowly,Upright as possible for all oral intake,Alternate solids and liquids,Effortful swallow,Small bites/sips     Treatment:  Pt seen bedside to address the following goals:  Treatment/Goals  1-The patient will tolerate instrumental swallowing procedure  6/28- goal met  ??  2- The pt/caregiver will demonstrate understanding of swallowing recommendations and concerns.  6/28-??????The pt and family member were??educated to purpose of the visit, anatomy and physiology of the swallow,??impact intubation can have on swallowing function, role vocal cords play in airway protectionconcerns for aspiration,??recommendation for modified barium swallow and a description of the procedure. Both??stated comprehension and pt was in agreement with MBS.??con't goal  6/28- second session- the pt and RN were educated to the results of the MBS via review of the video from the study as well as diet recommendations, rational for nectar thick liquids, swallowing strategies and plan to continue to  assess for possible diet advancement. Both stated comprehension. con't goal   6/29: Re-educated on results of MBS and diet level recommendations d/t risks associated with penetration/aspiration. Pt instructed on swallow strategies to utilize during all PO intake. Pt with adequate carry over, yet required cues for correct use. Cont.  6/30: Provided education to patient re: purpose of visit, swallow function, diet recommendations,  recommendation for repeat MBS. Pt stated comprehension.  7/1: Educated pt re: role of SLP, purpose of visit, swallow function, prior MBSs, thickened liquid use/rationale, general aspiration precautions. Pt still somewhat confused, but appears to have improved comprehension from prior visit.  7/4: ??Pt now on covid precautions. RN reports family had been brining in thin liquids for patient prior to [place] visitor restrictions. Pt also reports drinking 2 large water jugs of thin liquid previous date with minimal coughing (she attributes to phlegm). SLP provided education regarding results of MBS last week, including aspiration of both nectar / thin liquids, inconsistently silent. Pt able to verbalize understanding. Also educated pt on possible consequences of aspiration, need for repeated imaging prior to diet upgrade and recommendation to continue honey thick liquid. Encouraged pt to complete effortful swallows and pt agreed on repeating MBS next date.   7/6: Reviewed chin tuck; pt required min-mod cues/reteaching for proper implementation. Afterwards, independently utilizing. Reviewed recent MBS, as well as recommendation for f/u MBS at next level of care, and possible ENT if voice continues to be hoarse. Pt stated good comprehension  Continue goal.    3-The patient will tolerate least restrictive diet without s/s aspiration or respiratory decline.   6/29: Pt seen this AM to trial current diet level. Pt assisted with oral care via toothbrush and toothpaste prior to trials. Pt provided with cup sips nectar thick liquids and soft and bite sized solids. Pt readily self fed all trials. Pt instructed on taking small bites/sips and alternating solids/liquids. Pt with adequate carry over during trials given min cues. x1 thick, heavy sounding cough following completion of swallow of multiple cup sips nectar. No additional s/s penetration/aspiration. Pt provided with x10 ice chips to target effortful swallows. Pt completed  with adequate effort x10. Recommend continue current diet level with use of strategies. Cont.   6/30: Patient seen up in recliner on room air, eating breakfast items (nectar thick via cup, yogurt), and seen with whole meds in puree (administered by RN). Voice remains aphonic. Pt tolerated puree well with no overt s/s aspiration, however pt consistently demonstrating reactive cough with nectar via cup (of note was productive of sputum x1). Given consistent cough with specifically nectar thick liquids, recommend repeat MBS to check for possible change in swallow function.  7/1: Patient seated upright, edge of bed, on supplemental O2 via nc. Covid test still pending. Continues to have cough at baseline, prior to PO intake. As per repeat MBS results yesterday, pt now on Honey thick liquids, as she was found to be aspirating nectar thick liquids yesterday. This date, assessed tolerance honey thick and soft bite-sized. Pt continues to demonstrate improved tolerance of honey thick liquids, and improved tolerance of advanced solid (soft and bite sized), with positive oral acceptance, improved timeliness of oral prep, positive swallow movement, good oral clearance, no overt signs of aspiration or penetration. Advance back to soft and bite-sized, continue honey thick liquids.  7/4: Pt with improved cognitive status this date (confirmed by RN); Voice is mild-moderately dysphonic which is also improvement. Pt completed effortful swallows x20 with ice chips after oral care.  SLP encouraged pt to repeat swallow exercise later this date to improve laryngeal strength/ airway protection. She trialed thin liquid via cup with delayed throat clearing. Recommend continue current diet and repeated MBS.   7/6: Per RN/chart review, pt tolerating current diet well, and has been implementing chin tuck. Received pt awake, alert, seated up in recliner. Vocal quality much improved from when last seen by this therapist (much stronger, only mild  dysphonia/hoarseness), and pt appears stronger/seems to be feeling improved. Assessed tolerance thins via straw with chin tuck (pt declined regular solid trial). Pt with positive oral acceptance timely swallow movement, good oral clearance, no consistent overt signs of aspiration.   Cont goal    Patient/Family/Caregiver Education:  See goal 2 above    Compensatory Strategies:  Compensatory Swallowing Strategies : Eat/Feed slowly,Upright as possible for all oral intake,Alternate solids and liquids,Effortful swallow,Small bites/sips      Plan:  Continue dysphagia treatment with goals per plan of care.  Diet recommendations:     - Regular solids / thin liquids + chin tuck / meds with puree    DC recommendation: TBD  Treatment: 10  D/W nursing: Almyra Free  Needs met prior to leaving room, call button in reach.    [address] [place], [place], [place]  Pg. # [phone]    If patient is discharged prior to next treatment, this note will serve as the discharge summary

## 2021-02-24 NOTE — Discharge Summary (Signed)
Hospital Medicine Discharge Summary    Patient: Molly [name]     Gender: female  DOB: Oct 04, 1960   Age: 60 y.o.  MRN: [id number]    Admitting Physician: Waymond Cera, MD  Discharge Physician: Rosey Bath, DO    Code Status: Full Code     Admit Date: 02/09/2021   Discharge Date:  02/24/2021     Disposition:  Home     Discharge Diagnoses:    Active Hospital Problems    Diagnosis Date Noted   ??? Atrial fibrillation and flutter (HCC) [I48.91, I48.92]      Priority: Medium   ??? Paroxysmal atrial fibrillation (HCC) [I48.0]      Priority: Medium   ??? Atrial flutter with rapid ventricular response (HCC) [I48.92]      Priority: Medium   ??? Dilated cardiomyopathy (HCC) [I42.0]      Priority: Medium   ??? Acute respiratory failure with hypoxia (HCC) [J96.01]      Priority: Medium   ??? Lactic acidosis [E87.2]      Priority: Medium   ??? CHF (congestive heart failure), NYHA class I, acute on chronic, combined (HCC) [I50.43] 02/09/2021     Priority: Medium   ??? Shortness of breath [R06.02] 01/29/2021     Priority: Medium         Outpatient to do list:     1) Follow-up appointments:    Primary care provider  Cardiology    Condition at Discharge: Stable    Hospital Course:     Acute Exacerbation of New-Onset HFrEF??  Cardiogenic shock  Possibly ischemic vs nonischemic/viral/post-chemo   Recent ECHO on 6/17 with severe dilation of LV, EF of <20%, severe diffuse hypokinesis and grade 3 diastolic dysfunction.  - Required ICU admission for inotropic support and diuresis  - Further assessment, ischemic work-up per cardiology  - She has close outpatient follow-up with cardiology arranged  - Life Vest placement today  ??  Acute hypoxic respiratory failure 2/2 acute HFrEF   - S/p extubation 02/15/2021  ??  New-Onset Atrial Flutter??s/p ablation??(6/24)??  -??S/p ablation w/ EP 6/24  - Status post heparin drip.  Transitioned to Eliquis twice daily   - Cont Toprol, amio, digoxin  - Cardiology following, appreciate recommendation  ??  AKI -  resolved  - Likely 2/2 hypoperfusion due to cardiogenic shock  ??  Insomnia  - Treated with Seroquel while in the hospital  ??  Anxiety  - Continue cymbalta 60 mg PO daily   ??  SLE   -??Plaquenil 400 mg PO daily held while inpatient  ??  OA   - Holding Relafen  ??  COVID positive  - Repeat COVID test was negative but PCR positive  - Continue supportive care  - Has some cough but minor symptoms  ??  Morbid obesity BMI 40, complicates overall care  ??  Non-Hodgkin's lymphoma status postchemo 2006-2007      Additional findings or notes to primary provider:  None at this time    Discharge Medications:   Current Discharge Medication List        Current Discharge Medication List        Current Discharge Medication List      CONTINUE these medications which have NOT CHANGED    Details   Multiple Vitamins-Minerals (EMERGEN-C IMMUNE PLUS PO) Take 1 packet by mouth daily      sacubitril-valsartan (ENTRESTO) 24-26 MG per tablet Take 1 tablet by mouth 2 times daily  Qty: 60 tablet, Refills:  3      furosemide (LASIX) 40 MG tablet Take 1 tablet by mouth daily  Qty: 60 tablet, Refills: 3      spironolactone (ALDACTONE) 25 MG tablet Take 1 tablet by mouth daily  Qty: 30 tablet, Refills: 3      albuterol sulfate HFA (VENTOLIN HFA) 108 (90 Base) MCG/ACT inhaler Inhale 2 puffs into the lungs 4 times daily as needed for Wheezing  Qty: 54 g, Refills: 1      hydroxychloroquine (PLAQUENIL) 200 MG tablet Take 400 mg by mouth daily      DULoxetine (CYMBALTA) 60 MG extended release capsule Take 1 capsule by mouth daily  Qty: 30 capsule, Refills: 5    Associated Diagnoses: Primary osteoarthritis of one knee, unspecified laterality      nabumetone (RELAFEN) 500 MG tablet Take 1 tablet by mouth 2 times daily  Qty: 60 tablet, Refills: 5    Associated Diagnoses: Primary osteoarthritis of left knee; Status post total left knee replacement      vitamin D (CHOLECALCIFEROL) 1000 UNIT TABS tablet Take 1,000 Units by mouth daily Twice a day            Current  Discharge Medication List          Discharge ROS:  A complete review of systems was asked and negative except for dry cough.     Discharge Exam:    BP (!) 155/94    Pulse 63    Temp 98.5 ??F (36.9 ??C) (Oral)    Resp 18    Ht 5\' 11"  (1.803 m)    Wt 286 lb 12.8 oz (130.1 kg)    SpO2 97%    BMI 40.00 kg/m??   General appearance:  NAD. Obese.   HEENT:   Normal cephalic, atraumatic, moist mucous membranes, no oropharyngeal erythema or exudate  Neck: Supple, trachea midline, no anterior cervical or SC LAD  Heart:: Normal s1/s2, RRR, no murmurs, gallops, or rubs. No leg edema  Lungs:  Clear to auscultation bilaterally, no wheeze, rales or rhonchi, no use of accessory musclesNormal respiratory effort. Clear to auscultation, bilaterally without Rales/Wheezes/Rhonchi.  Abdomen: Soft, non-tender, non-distended, bowel sounds present, no masses  Musculoskeletal:  No clubbing, no cyanosis, or edema  Skin: No lesion or masses  Neurologic:  Neurovascularly intact without any focal sensory/motor deficits. Cranial nerves: II-XII intact, grossly non-focal.  Psychiatric:  Alert and oriented, thought content appropriate    Labs: For convenience and continuity at follow-up the following most recent labs are provided:    Lab Results   Component Value Date/Time    WBC 6.5 02/24/2021 05:30 AM    HGB 14.5 02/24/2021 05:30 AM    HCT 43.8 02/24/2021 05:30 AM    MCV 88.2 02/24/2021 05:30 AM    PLT 423 02/24/2021 05:30 AM    NA 132 02/23/2021 10:26 PM    K 3.9 02/23/2021 10:26 PM    K 3.7 02/09/2021 06:40 PM    CL 94 02/23/2021 10:26 PM    CO2 25 02/23/2021 10:26 PM    BUN 26 02/23/2021 10:26 PM    CREATININE 1.0 02/23/2021 10:26 PM    CALCIUM 9.0 02/23/2021 10:26 PM    PHOS 4.4 02/14/2021 06:42 PM    ALKPHOS 138 02/12/2021 03:23 PM    ALT 79 02/12/2021 03:23 PM    AST 86 02/12/2021 03:23 PM    BILITOT 1.9 02/12/2021 03:23 PM    BILIDIR <0.2 04/19/2017 11:01 AM    LABALBU 3.5 02/14/2021  06:42 PM    LDLCALC 72 02/10/2021 05:22 AM    TRIG 82  02/10/2021 05:22 AM     Lab Results   Component Value Date    INR 1.23 (H) 02/15/2021    INR 1.27 (H) 02/10/2021    INR 1.04 10/25/2016       Radiology:  Echocardiogram complete    Result Date: 02/05/2021  Transthoracic Echocardiography Report (TTE)  Demographics   Patient Name      Rollen SoxMILLER Zainab A   Date of Study     02/05/2021          Gender              Female   Patient Number    [id number]          Date of Birth       [birth year]   Visit Number      [id number]           Age                 60 year(s)   Accession Number  [id number]          Room Number   Corporate ID      [id number]            Sonographer         Alvia Groveexter, Stephanie,                                                            RDCS   Ordering          Gettelfinger,       Interpreting        [facility]  Physician         Leotis ShamesLauren, CNP         Physician           Becky Augustaesai, Abhijit N,                                                            MD  Procedure Type of Study   TTE procedure:ECHOCARDIOGRAM COMPLETE 2D W DOPPLER W COLOR.  Procedure Date Date: 02/05/2021 Start: 10:29 AM [facility] Technical Quality: Adequate visualization Indications:Cardiomegaly and Dyspnea/SOB. Additional Indications:Non hodgkin's Lymphoma Lupus . Patient Status: Routine Height: 79 inches Weight: 336.01 pounds BSA: 2.84 m2 BMI: 37.85 kg/m2 Rhythm: Sinus tachycardia HR: 125 bpm BP: 115/93 mmHg  Conclusions   Summary  Left ventricular cavity size is severely dilated.  Normal left ventricular wall thickness.  Ejection fraction is visually estimated to be <20%.  There is severe diffuse hypokinesis.  Global Longitiudinal Strain= -5.0%  Grade III diastolic dysfunction with elevated LV filling pressures.  Moderate thickening of leaflets of mitral valve.  moderate mitral regurgitation.  Mildly dilated right ventricle.  Right ventricular systolic function is severely reduced.  The tricuspid valve was not well visualized.  moderate tricuspid regurgitation.   The right atrium is moderately dilated.  IVC size is dilated (>2.1 cm) and collapses < 50% with respiration  consistent with elevated RA pressure (15  mmHg).  Estimated pulmonary artery systolic pressure is mildly elevated at 40 mmHg  assuming a right atrial pressure of 15 mmHg.   Signature   ------------------------------------------------------------------  Electronically signed by Becky Augusta, MD (Interpreting  physician) on 02/05/2021 at 11:36 AM  ------------------------------------------------------------------   Findings   Left Ventricle  Left ventricular cavity size is severely dilated.  Normal left ventricular wall thickness.  Ejection fraction is visually estimated to be <20%.  There is severe diffuse hypokinesis.  Global Longitiudinal Strain= -5.0%  Grade III diastolic dysfunction with elevated LV filling pressures.   Mitral Valve  Moderate thickening of leaflets of mitral valve.  moderate mitral regurgitation.  No evidence of mitral stenosis.   Left Atrium  Left atrium is of normal size.   Aortic Valve  Aortic valve leaflets appear calcified.  Trivial aortic regurgitation.  No evidence of aortic valve stenosis.   Aorta  The aortic root is normal in size.  The ascending aorta is mildly dilated.   Right Ventricle  Mildly dilated right ventricle.  Right ventricular systolic function is severely reduced.  TAPSE= 1.2cm   Tricuspid Valve  The tricuspid valve was not well visualized.  moderate tricuspid regurgitation.  No evidence of tricuspid stenosis.   Right Atrium  The right atrium is moderately dilated.   Pulmonic Valve  The pulmonic valve is structurally normal.  Trivial-Mild pulmonic regurgitation present.  No evidence of pulmonic valve stenosis.   Pericardial Effusion  No pericardial effusion noted.  Anterior Fat Pad Noted.   Pleural Effusion  No pleural effusion.   Miscellaneous  IVC size is dilated (>2.1 cm) and collapses < 50% with respiration  consistent with elevated RA pressure (15 mmHg).   Estimated pulmonary artery systolic pressure is mildly elevated at 40 mmHg  assuming a right atrial pressure of 15 mmHg.  M-Mode/2D Measurements (cm)   LV Diastolic Dimension: 6.86 cm LV Systolic Dimension: 5.92 cm  LV Septum Diastolic: 1.01 cm  LV PW Diastolic: 0.84 cm        AO Root Dimension: 3.3 cm  RV Diastolic Dimension: 4.2 cm                                  LA Area: 27.5 cm2  LVOT: 2.5 cm                    LA volume/Index: 87.9 ml /31 ml/m2  Doppler Measurements   AV Peak Velocity: 84.8 cm/s    MV Peak E-Wave: 118 cm/s  AV Peak Gradient: 2.88 mmHg  AV Mean Gradient: 2 mmHg       MV Mean Gradient: 3 mmHg  LVOT Peak Velocity: 70.9 cm/s  MV Max PG: 68 mmHg  AV Area (Continuity):4.69 cm2  MV Vmax:412 cm/s                                 MV VTI:15.4 cm/s  TR Velocity:247 cm/s  TR Gradient:24.4 mmHg          MV Area (continuity): 3.41 cm2  Estimated RAP:15 mmHg          MV Deceleration Time: 108 msec  E' Septal Velocity: 5.82 cm/s  E' Lateral Velocity: 8.83 cm/s  PV Peak Velocity: 53.6 cm/s  PV Peak Gradient: 1.15 mmHg   Aortic Valve   Peak Velocity: 84.8 cm/s    Mean  Velocity: 62.7 cm/s  Peak Gradient: 2.88 mmHg    Mean Gradient: 2 mmHg  Area (continuity): 4.69 cm2  AV VTI: 11.2 cm  Aorta   Aortic Root: 3.3 cm  Ascending Aorta: 3.7 cm  LVOT Diameter: 2.5 cm      Echo Limited    Result Date: 02/20/2021  Transthoracic Echocardiography Report (TTE)  Demographics   Patient Name      TOWANA STENGLEIN A   Date of Study     02/12/2021          Gender              Female   Patient Number    [id number]          Date of Birth       1961-05-23   Visit Number      [id number]           Age                 42 year(s)   Accession Number  [id number]          Room Number         6302   Corporate ID      [id number]            Sonographer         Jodean Lima,                                                            RVT, RDCS   Ordering          Lakshmanadoss       Interpreting        [facility]  Physician         ,Glynis Smiles MD       Physician           Siri Cole,                                                            MD  Procedure Type of Study   TTE procedure:ECHOCARDIOGRAM LIMITED.  Procedure Date Date: 02/12/2021 Start: 01:59 PM Study Location: [facility] - Echo Lab Technical Quality: Limited visualization Indications:Pericardial effusion. Patient Status: Routine  Conclusions   Summary  Limited study. No pericardial effusion noted.   Signature   ------------------------------------------------------------------  Electronically signed by Siri Cole, MD (Interpreting  physician) on 02/20/2021 at 10:14 AM  ------------------------------------------------------------------   Findings   Pericardial Effusion  No pericardial effusion noted.      CT CHEST PULMONARY EMBOLISM W CONTRAST    Result Date: 02/09/2021   CT ANGIOGRAPHY OF THE CHEST: CLINICAL HISTORY: Tachycardia. Congestive heart failure. TECHNIQUE: Spiral CT images of the chest were obtained during the intravenous administration of 80 ml of Isovue-370 contrast. Maximum intensity projection (M.I.P.) reconstructions of the image data set were performed.  Up-to-date CT equipment and radiation dose reduction techniques were employed. COMPARISON: None. FINDINGS: There is no evidence of any pulmonary embolism. Thoracic aorta is without aneurysm. There is four-chamber cardiac enlargement. There is some reflux of contrast into the  hepatic veins which can be seen with elevated right heart pressures. There is atherosclerotic calcification of the coronary arteries. No pericardial effusion is identified. No axillary, mediastinal or hilar lymphadenopathy is identified. No consolidation is identified. Interstitial lung markings are normal. There is some respiratory motion artifact. Central airways are patent. No bronchiectasis is identified.     1. No evidence of any pulmonary embolism. 2. Four-chamber cardiac enlargement with reflux of contrast into the hepatic veins. This  can be seen with elevated right heart pressures. 3. No acute pulmonary process.        The patient was seen and examined on day of discharge and this discharge summary is in conjunction with any daily progress note from day of discharge.Time Spent on discharge is more than 30 minutes in the examination, evaluation, counseling and review of medications and discharge plan.          SignedRosey Bath, DO   02/24/2021      Thank you Lindell Spar, APRN - CNP for the opportunity to be involved in this patient's care. If you have any questions or concerns please feel free to contact me at perfectserve.

## 2021-02-24 NOTE — Progress Notes (Signed)
Physical Therapy  Facility/Department: [facility] TELEMETRY  Daily Treatment Note  NAME: Molly [name]  DOB: 1961/07/28  MRN: [id number]    Date of Service: 02/24/2021    Discharge Recommendations:  Molly [name] scored a 18/24 on the AM-PAC short mobility form. Current research shows that an AM-PAC score of 17 or less is typically not associated with a discharge to the patient's home setting. Based on the patient's AM-PAC score and their current functional mobility deficits, it is recommended that the patient have 5-7 sessions per week of Physical Therapy at d/c to increase the patient's independence.  At this time, this patient demonstrates complex nursing, medical, and rehabilitative needs, and would benefit from intensive rehabilitation services upon discharge from the Inpatient setting.  This patient demonstrates the ability to participate in and benefit from an intensive therapy program with a coordinated interdisciplinary team approach to foster frequent, structured, and documented communication among disciplines, who will work together to establish, prioritize, and achieve treatment goals. Please see assessment section for further patient specific details.    If patient discharges prior to next session this note will serve as a discharge summary.  Please see below for the latest assessment towards goals.     Patient would benefit from continued therapy after discharge   PT Equipment Recommendations  Other: plan to continue to assess and defer recommendations to next level of care - if d/c home, PT recommends RW    Patient Diagnosis(es): The primary encounter diagnosis was Shortness of breath. Diagnoses of Congestive heart failure, unspecified HF chronicity, unspecified heart failure type ([place]), Acute on chronic combined systolic and diastolic congestive heart failure ([place]), CHF (congestive heart failure), NYHA class I, acute on chronic, combined ([place]), Atrial flutter with rapid ventricular response ([place]),  Dilated cardiomyopathy ([place]), and Acute respiratory failure with hypoxia ([place]) were also pertinent to this visit.    Assessment   Assessment: Pt demonstrates improved endurance through increased distance ambulated. Increased instability noted without AD with increased distance ambulated. Pt would benefit from continued skilled inpatient PT in order to progress towards highly independent and mobile baseline. Of home pt would benefit from 24/7 and HHPT.  Activity Tolerance: Patient limited by fatigue;Patient limited by endurance  Other: plan to continue to assess and defer recommendations to next level of care - if d/c home, PT recommends RW     Plan    Plan  Plan:  (2-5)  Current Treatment Recommendations: Strengthening;ROM;Balance training;Functional mobility training;Transfer training;Gait training;Patient/Caregiver education & training;Endurance training;Home exercise program;Safety education & training;Equipment evaluation, education, & procurement;Therapeutic activities     Restrictions  Restrictions/Precautions  Restrictions/Precautions: Fall Risk (high fall risk)  Position Activity Restriction  Other position/activity restrictions: strict I&O, up as tolerated; diet - dysphagia soft and bite sized, moderately thick/honey liquids     Subjective    Subjective  Subjective: Pt supine in bed upon approach and agreeable to PT.  Pain: Denies pain     Objective   Bed Mobility Training  Bed Mobility Training: Yes  Supine to Sit: Supervision (supv, HOB elevated)  Balance  Sitting:  (indp seated EOB)  Standing:  (SBA for static standing balance and to wash up at sink with washcloth without UE support with exception of CGA for bending over to wash LEs)  Transfer Training  Transfer Training: Yes  Sit to Stand: Supervision (at EOB/recliner with and without RW)  Stand to Sit: Supervision (at EOB/recliner with and without RW)  Corporate treasurer: Yes  Gait  Overall Level of Assistance: Stand-by  assistance;Contact-guard assistance (SBA with RW, CGA without device)  Gait Abnormalities: Trunk sway increased (increased postural sway without device, increased forward flexion with RW (VC for upright posture))  Distance (ft): 90 Feet (90' x 2, once with RW, once without AD)  Assistive Device: Walker, rollator (and no AD)           Safety Devices  Type of Devices: Call light within reach;Chair alarm in place;Gait belt;Left in chair;Nurse notified       Goals  Short Term Goals  Time Frame for Short term goals: dc - all goals ongoing and updated as indicated  Short term goal 1: Bed Mobility SBA.  Short term goal 2: Sit<>stand CGA. - goal met 7/5; updated: Pt. will demonstrate sit <-> stand with SBA and LRAD  Short term goal 3: Ambulate 71' with RW CGA.  Short term goal 4: Demo independence with LE HEP x 10 reps.  Patient Goals   Patient goals : "get stronger, go to rehab, be able to go home"    Education  Patient Education  Education Given To: Patient  Education Provided: Plan of Catering manager  Education Provided Comments: use of call light, PT recommendations, pt recommendations  Education Method: Demonstration;Verbal  Barriers to Learning: None  Education Outcome: Verbalized understanding;Demonstrated understanding    Therapy Time   Individual Concurrent Group Co-treatment   Time In 0856         Time Out 0937         Minutes 41         Timed Code Treatment Minutes: [place], [place]

## 2021-02-24 NOTE — Progress Notes (Signed)
Triple lumen pulled, hemostasis achieved, and pressure dressing applied, pt leaving stable via wc with husband, leaving with all belongings including life vest and discharge packet

## 2021-02-25 ENCOUNTER — Encounter: Attending: Nurse Practitioner | Primary: Gerontology

## 2021-02-25 NOTE — Care Coordination-Inpatient (Signed)
[facility] Care Transitions Initial Follow Up Call    Call within 2 business days of discharge: Yes    Patient:  Molly [name]  Patient DOB:  1961/02/10  MRN:  [id number]   Reason for Admission:  Covid 19 monitoring   Discharge Date:  02/24/21  RARS: 9      Transitions of Care Initial Call    Was this an external facility discharge?  no    Discharge Facility: [facility]      Challenges to be reviewed by the provider   Additional needs identified to be addressed with provider:    no    AMB CC Provider Discharge Needs: none            CTC attempt to reach Pt regarding recent hospital discharge.  CTC left voice recording with call back number requesting a call back.    Follow up appointments:    Future Appointments   Date Time Provider Department Center   03/03/2021 10:45 AM Edgar Frisk, APRN - CNP [facility]       Manuella Blackson V. Santiago Bumpers BSN, RN  Care Transition Coordinator  Contact Number:  [phone]'

## 2021-02-26 NOTE — Care Coordination-Inpatient (Addendum)
[facility] Care Transitions Initial Follow Up Call    Call within 2 business days of discharge: Yes    Patient: Molly [name] Patient DOB: 1961-01-15   MRN: [id number]  Reason for Admission: Covid 19 Monitoring/CHF Discharge Date: 02/24/21 RARS: Readmission Risk Score: 9.3 ( )      Last Discharge [name]       Complaint Diagnosis Description Type Department Provider    02/09/21 Shortness of Breath Shortness of breath ... ED to Hosp-Admission (Discharged) (ADMITTED) [facility] Royer, DO; Enzo Montgomery Doerning...           Spoke with: na    Facility: [name]    2nd attempt to make contact with patient/caregiver for an initial transitions of care follow up call post discharge without success. Left a HIPPA compliant messag regarding intent of call and call back information.  Any previously scheduled hospital follow up appointments noted below.  No further outreach will be attempted.         Care Transitions 24 Hour Call    Care Transitions Interventions         Follow Up  Future Appointments   Date Time Provider Department Center   03/03/2021 10:45 AM Edgar Frisk, APRN - CNP [facility]       Leda Min, LPN

## 2021-02-27 NOTE — Progress Notes (Signed)
Attempted to reach patient for 72 hour HF hospital follow up.  Phone call was unanswered / no opportunity to leave message.   CTN attempted contact on 7/7 and 7/8 without success.   This call will serve as third attempt.   No further attempts will be made.

## 2021-02-27 NOTE — Progress Notes (Signed)
Attempted to reach patient for 72 hour HF hospital follow up.  Phone just rang -- no answer, no opportunity to leave VM.   CTN attempted contact on 7/7 and 7/8.    This call will serve as third attempt of contact.   No further attempts will be made.

## 2021-03-02 NOTE — Telephone Encounter (Signed)
Patient needs to follow up with Amil Amen for heart failure f/u post hospital. We will keep her appt with EP 7/26. Thanks!

## 2021-03-02 NOTE — Telephone Encounter (Signed)
Patient has had an a flutter ablation 06/24 and is scheduled 03/03/2021 with Amil Amen,    Her follow up with EPNP is scheduled 07/26    She was unsure if she should keep her appointment with   Amil Amen, but has a lot of questions    Please advise    [phone]

## 2021-03-03 ENCOUNTER — Ambulatory Visit: Admit: 2021-03-03 | Discharge: 2021-03-03 | Payer: MEDICAID | Attending: Nurse Practitioner | Primary: Gerontology

## 2021-03-03 DIAGNOSIS — R0602 Shortness of breath: Secondary | ICD-10-CM

## 2021-03-03 MED ORDER — AMIODARONE HCL 200 MG PO TABS
200 MG | ORAL_TABLET | Freq: Every day | ORAL | 5 refills | Status: DC
Start: 2021-03-03 — End: 2021-05-11

## 2021-03-03 NOTE — Progress Notes (Signed)
[facility]     Outpatient Follow Up Note  Dr Cira Rue MD,  [facility]   Edgar Frisk RN, APRN,CNP CVNP      CHIEF COMPLAINT / HPI: Molly [name] is 60 y.o. female who presents today with a history of  non-hodgkins lymphoma, last MUGA test was 2 years after her last chemotherapy and cardiac function was reportedly normal.s/p radiation, lupus, obesity COVID who p/w SOB, Echo showed newly reduced EF 20%, found to be in AFL with poorly controlled ventricular rate , s/p RFA of AFL (02/12/21, Dr. Larwance Sachs) became hypoxic post procedure and was unable to be intubated, developed AF, placed on amio gtt, extubated 02/15/21, noted to be covid + (02/18/21)  Echo 02/05/2021:??LVD 6.8 cm, LVEF less than 20%, diastolic grade 3, increased LVEDP, severe RV dilatation and dysfunction, moderate MR/TR, RVSP 40 (15). ??Images were personally reviewed.  ??  S/p atrial flutter ablation 6/24.  Patient could not be extubated after the procedure due to hypoxia and therefore was transferred intubated to the ICU.  ??  Interval history:  VSS wt stable appears near euvolemic Zoll vest on   complaint with meds   Discussed nutrition / sodium intake / fluid intake   Recommend activity as tolerated   On ace/BB/eliquis/ lasix / b/p low at home 90 systolic / will not add aldactone / SGLT2 at this time /recheck  labs first    Fu with Dr Harrison Mons in approx a month   Dr Kendra Opitz 03/16/2021 appt   Blood work in 3-4 weeks   TTE in 3 months     I spent a total of 35 minutes and greater than 50% of the time was spent counseling with patient  coordinating care regarding her diagnosis, reviewing labs, cardiac work up, treatments and plan of care.    Past Medical History:   Diagnosis Date   ??? Amenorrhea    ??? Cancer (HCC)    ??? Chronic combined systolic and diastolic CHF, NYHA class 2 (HCC)    ??? History of non-Hodgkin's lymphoma 2006    treated with radiation and chemo   ??? Systemic lupus     followed by Dr. Harley Hallmark     Social History:    Social  History     Tobacco Use   Smoking Status Former Smoker   ??? Packs/day: 0.50   ??? Years: 18.00   ??? Pack years: 9.00   ??? Types: Cigarettes   ??? Quit date: 2008   ??? Years since quitting: 14.5   Smokeless Tobacco Never Used     Current Medications:  Current Outpatient Medications   Medication Sig Dispense Refill   ??? amiodarone (CORDARONE) 200 MG tablet Take one 200 mg tablet twice daily through the evening of July 12th, then start taking one 200 mg tablet daily only. 13 tablet 0   ??? apixaban (ELIQUIS) 5 MG TABS tablet Take 1 tablet by mouth 2 times daily 60 tablet 2   ??? losartan (COZAAR) 25 MG tablet Take 0.5 tablets by mouth daily Do not take if your morning blood pressure is less than 90 mm Hg (top number) 30 tablet 3   ??? metoprolol succinate (TOPROL XL) 25 MG extended release tablet Take 1 tablet by mouth daily 30 tablet 3   ??? digoxin (LANOXIN) 125 MCG tablet Take 1 tablet by mouth daily 30 tablet 3   ??? melatonin 5 MG TBDP disintegrating tablet Take 1 tablet by mouth nightly     ??? furosemide (  LASIX) 40 MG tablet Take 1 tablet by mouth daily 60 tablet 3   ??? albuterol sulfate HFA (VENTOLIN HFA) 108 (90 Base) MCG/ACT inhaler Inhale 2 puffs into the lungs 4 times daily as needed for Wheezing 54 g 1   ??? DULoxetine (CYMBALTA) 60 MG extended release capsule Take 1 capsule by mouth daily 30 capsule 5     No current facility-administered medications for this visit.     REVIEW OF SYSTEMS:    CONSTITUTIONAL: No major weight gain or loss, night sweats, fever, fatigue, or weakness.  HEENT: No new vision difficulties or ringing in the ears.  RESPIRATORY: No new SOB, PND, orthopnea or cough.   CARDIOVASCULAR: See HPI  GI: No N/V/D, constipation, or abdominal pain. No black/tarry stools  GU: No urinary urgency, incontinence, or hematuria.  SKIN: No cyanosis or skin lesions.  MUSCULOSKELETAL: No new muscle or joint pain.  NEUROLOGICAL: No syncope or TIA-like symptoms.  PSYCHIATRIC: No anxiety, pain, insomnia or depression    Vitals:     03/03/21 1028   BP: 110/72   Pulse: 70   Weight: 289 lb (131.1 kg)   Height: 5\' 11"  (1.803 m)      VITALS:  BP 110/72    Pulse 70    Ht 5\' 11"  (1.803 m)    Wt 289 lb (131.1 kg)    BMI 40.31 kg/m??   CONSTITUTIONAL: Cooperative, no apparent distress, and appears well nourished / developed  NEUROLOGIC:  Awake and orientated to person, place, and time.  PSYCH: Calm affect.  SKIN: Warm and dry.  HEENT: Sclera non-icteric, normocephalic, neck supple.  RESPIRATORY:  No increased work of breathing and clear to auscultation, no crackles or wheezing.  CARDIOVASCULAR:  Regular rate and rhythm     GI:  Normal bowel sounds. Non-distended. Non-tender to palpation  EXT: No edema. No calf tenderness. Pulses are present bilaterally.    Wt Readings from Last 3 Encounters:   03/03/21 289 lb (131.1 kg)   02/24/21 286 lb 12.8 oz (130.1 kg)   02/01/21 (!) 336 lb (152.4 kg)      Pulse Readings from Last 3 Encounters:   03/03/21 70   02/24/21 88   02/01/21 95     BP Readings from Last 3 Encounters:   03/03/21 110/72   02/24/21 114/60   02/01/21 126/84        Lab Results   Component Value Date    ALT 79 (H) 02/12/2021    AST 86 (H) 02/12/2021    ALKPHOS 138 (H) 02/12/2021    BILITOT 1.9 (H) 02/12/2021     Lab Results   Component Value Date    CREATININE 1.0 02/23/2021    BUN 26 (H) 02/23/2021    NA 132 (L) 02/23/2021    K 3.9 02/23/2021    CL 94 (L) 02/23/2021    CO2 25 02/23/2021       Lab Results   Component Value Date    WBC 6.5 02/24/2021    HGB 14.5 02/24/2021    HCT 43.8 02/24/2021    MCV 88.2 02/24/2021    PLT 423 02/24/2021     Lab Results   Component Value Date    TRIG 82 02/10/2021    TRIG 149 04/24/2014    TRIG 161 (H) 02/05/2013     Lab Results   Component Value Date    HDL 33 (L) 02/10/2021    HDL 55 07/01/2019    HDL 63 (H) 04/24/2014  Lab Results   Component Value Date    LDLCALC 72 02/10/2021    LDLCALC 149 (H) 07/01/2019    LDLCALC 152 (H) 04/24/2014     Lab Results   Component Value Date    LABVLDL 16 02/10/2021     LABVLDL 34 07/01/2019    LABVLDL 30 04/24/2014   Radiology Review:  Pertinent images / reports were reviewed as a part of this visit and reveals the following:    Assessment:   AF/AFL  - In NSR  - S/p RFA of AFL w/ pAF noted  - CHADSVASc at least 2  - On Eliquis 5 mg BID, no bleeding, continue no NSAIDS only tylenol   - On amio 200 mg BID x 2 weeks then 200 mg QD / now on 200mg  daily   - On Toprol 25 mg QD, dig 125 mcg QD  ??  CMP  - EF <20% / zoll vest on   - NYHA II/III  - On Toprol 25 mg QD, losartan 12.5 mg QD  - spironolactone and lasix on hold d/t AKI   - Cr improved  - Dr. following    Obesity  Body mass index is 40.31 kg/m??.  -discussed   ??  Plan:   VSS wt stable appears near euvolemic Zoll vest on   On ace/BB/eliquis/ lasix / b/p low at home 90 systolic / will not add aldactone / SGLT2 at this time / recheck  labs first    Fu with Dr Harrison Mons in approx a month   Dr Harrison Mons Starleen Arms 03/16/2021 appt   Blood work in 3-4 weeks   Ok to start  cardiac reheb when ok with Dr 03/18/2021 & Dr Harrison Mons  TTE in 3 months     Starleen Arms APRN FNP CVNP   [facility]     Documentation of today's visit sent to PCP

## 2021-03-03 NOTE — Patient Instructions (Signed)
Fu with Dr Harrison Mons in approx a month   Dr Kendra Opitz 03/16/2021 appt   Blood work in 3-4 weeks   TTE in 3 months

## 2021-03-04 ENCOUNTER — Encounter: Attending: Nurse Practitioner | Primary: Gerontology

## 2021-03-04 ENCOUNTER — Encounter

## 2021-03-05 LAB — VITAMIN D 25 HYDROXY: Vit D, 25-Hydroxy: 15.1 ng/mL — ABNORMAL LOW (ref >=30)

## 2021-03-05 LAB — COMPREHENSIVE METABOLIC PANEL
Albumin/Globulin Ratio: 1.2 (ref 1.1–2.2)
Anion Gap: 17 — ABNORMAL HIGH (ref 3–16)
BUN: 11 mg/dL (ref 7–20)
CO2: 26 mmol/L (ref 21–32)
Calcium: 9.1 mg/dL (ref 8.3–10.6)
Chloride: 97 mmol/L — ABNORMAL LOW (ref 99–110)
Creatinine: 0.8 mg/dL (ref 0.6–1.1)
GFR African American: >60 (ref >60)
GFR Non-African American: >60 (ref >60)
Glucose: 73 mg/dL (ref 70–99)
Potassium: 3.9 mmol/L (ref 3.5–5.1)
Sodium: 140 mmol/L (ref 136–145)

## 2021-03-05 LAB — HEPATIC FUNCTION PANEL
ALT: 28 U/L (ref 10–40)
AST: 33 U/L (ref 15–37)
Albumin: 4.1 g/dL (ref 3.4–5.0)
Alkaline Phosphatase: 132 U/L — ABNORMAL HIGH (ref 40–129)
Bilirubin, Direct: 0.3 mg/dL (ref 0.0–0.3)
Bilirubin, Indirect: 0.5 mg/dL (ref 0.0–1.0)
Total Bilirubin: 0.8 mg/dL (ref 0.0–1.0)
Total Protein: 7.5 g/dL (ref 6.4–8.2)

## 2021-03-05 LAB — LIPID PANEL
Cholesterol, Total: 249 mg/dL — ABNORMAL HIGH (ref 0–199)
HDL: 44 mg/dL (ref 40–60)
LDL Calculated: 176 mg/dL — ABNORMAL HIGH (ref <100)
Triglycerides: 146 mg/dL (ref 0–150)
VLDL Cholesterol Calculated: 29 mg/dL

## 2021-03-05 LAB — MAGNESIUM: Magnesium: 2 mg/dL (ref 1.80–2.40)

## 2021-03-05 LAB — TSH WITH REFLEX TO FT4: TSH Reflex FT4: 6.79 u[IU]/mL — ABNORMAL HIGH (ref 0.27–4.20)

## 2021-03-05 LAB — T4, FREE: T4 Free: 1.4 ng/dL (ref 0.9–1.8)

## 2021-03-05 LAB — BRAIN NATRIURETIC PEPTIDE: Pro-BNP: 1923 pg/mL — ABNORMAL HIGH (ref 0–124)

## 2021-03-08 NOTE — Telephone Encounter (Signed)
Called and left the patient a message letting her know that her vitamin d was low and Amil Amen would like her to start 800 units of it daily.

## 2021-03-08 NOTE — Telephone Encounter (Signed)
-----   Message from Edgar Frisk, APRN - CNP sent at 03/05/2021  3:27 PM EDT -----  Vit D supplement at least 800 units per day  OTC   Ask pharmacist if needs help with finding vitamins on the shelf

## 2021-03-15 ENCOUNTER — Encounter: Primary: Gerontology

## 2021-03-15 ENCOUNTER — Ambulatory Visit: Payer: MEDICAID | Primary: Gerontology

## 2021-03-15 MED ORDER — DIGOXIN 125 MCG PO TABS
125 MCG | ORAL_TABLET | Freq: Every day | ORAL | 3 refills | Status: AC
Start: 2021-03-15 — End: 2021-05-11

## 2021-03-15 MED ORDER — METOPROLOL SUCCINATE ER 25 MG PO TB24
25 MG | ORAL_TABLET | Freq: Every day | ORAL | 3 refills | Status: DC
Start: 2021-03-15 — End: 2021-05-18

## 2021-03-15 NOTE — Telephone Encounter (Signed)
Pt called saying she showed up for her Echo and it was cancelled and wanted to know why.

## 2021-03-15 NOTE — Telephone Encounter (Signed)
Lmom ,insurance  has not approved Echo still pending

## 2021-03-16 ENCOUNTER — Ambulatory Visit: Admit: 2021-03-16 | Discharge: 2021-03-16 | Payer: MEDICAID | Attending: Nurse Practitioner | Primary: Gerontology

## 2021-03-16 DIAGNOSIS — I483 Typical atrial flutter: Secondary | ICD-10-CM

## 2021-03-16 NOTE — Progress Notes (Signed)
[facility]   Electrophysiology  Office Visit  Date: 03/16/2021    Chief Complaint   Patient presents with    Atrial Flutter    Atrial Fibrillation    Cardiomyopathy    Congestive Heart Failure       Cardiac HX: Molly [name] is a 60 y.o. woman with a h/o morbid obesity, NHL of the L groin (2006), s/p chemo (2006-2007), SLE, RA, who p/w ongoing SOB, cough, BLE edema for approx prior 30 days, was hypotensive, BNP 4700, found to have an EF<20% per echo, grade III DD, found to be in AFL with a HR of 127 bpm, CT neg for PE, poss elevated R sided pressures, developed hypoxia on 02/10/2021 that resolved with NRB, s/p RFA of AFL (02/12/2021), became hypoxic post procedure and was unable to be intubated, developed AF, placed on an amio gtt, and pressors, extubated 6/27, changed to po amio, found to be COVID +.      Interval History/HPI: She is here for follow-up for AF/AFL, NICMP.  Patient had presented to [name] with progressively worsening SOB ongoing for 6 weeks.  She was also noted to have a 40 pound weight gain.  She saw her PCP who ordered an echo that showed an EF of less than 20%.  She was then referred to the emergency room for further evaluation due to ongoing symptoms.  She was noted to be in AFL with a poorly controlled ventricular rate.  She was placed on aggressive IV diuresis.  She was scheduled for catheter ablation of her atrial flutter on 02/12/2021 and was noted to have a drop in her saturation that day.  Anesthesia had recommended elective intubation for the procedure.  Post procedure she was unable to be extubated and was transferred to the ICU.  She required pressor support for her blood pressure.  She did develop atrial fibrillation during her hospital stay and was placed on amiodarone drip.  This was changed to p.o. amiodarone.  Today she presents in normal sinus rhythm.  Her heart rate is 90 bpm.  She has noted that her heart rate is elevated up to 130s at times.  She does have a ZOLL LifeVest  in place and review of her strips today shows that she has had no episodes of VT or VF.  Her heart rate averages around 75.  She remains on amiodarone 200 mg daily.  Her baseline TSH was 6.79.  LFTs were stable.  She was sent home on losartan 12.5 mg daily.  She is to hold it if her blood pressure is less than 90 mmHg.  She states that when she does take it her blood pressure tanks and she feels horrible.  She gets lightheaded and has to lie down.  She took it this morning and states that she was 130/70 and dropped to 90/60.  She is tolerating the Toprol.  Her weight is down to 288.  She states her baseline is 283.  She only gets dizzy going from sitting to standing when she takes the losartan.  She is complaining of weakness and fatigue.  She is trying to do things at home and needs to take a rest.  She denies any chest discomfort.  She does complain of shortness of breath with exertion.  She denies any PND, orthopnea or edema.    Home medications:   Current Outpatient Medications on File Prior to Visit   Medication Sig Dispense Refill    digoxin (LANOXIN) 125 MCG  tablet Take 1 tablet by mouth in the morning. 30 tablet 3    metoprolol succinate (TOPROL XL) 25 MG extended release tablet Take 1 tablet by mouth in the morning. 30 tablet 3    amiodarone (CORDARONE) 200 MG tablet Take 1 tablet by mouth daily 30 tablet 5    apixaban (ELIQUIS) 5 MG TABS tablet Take 1 tablet by mouth 2 times daily 60 tablet 2    losartan (COZAAR) 25 MG tablet Take 0.5 tablets by mouth daily Do not take if your morning blood pressure is less than 90 mm Hg (top number) 30 tablet 3    melatonin 5 MG TBDP disintegrating tablet Take 1 tablet by mouth nightly      furosemide (LASIX) 40 MG tablet Take 1 tablet by mouth daily 60 tablet 3    albuterol sulfate HFA (VENTOLIN HFA) 108 (90 Base) MCG/ACT inhaler Inhale 2 puffs into the lungs 4 times daily as needed for Wheezing 54 g 1    DULoxetine (CYMBALTA) 60 MG extended release capsule Take 1  capsule by mouth daily 30 capsule 5     No current facility-administered medications on file prior to visit.       Past Medical History:   Diagnosis Date    Amenorrhea     Cancer (HCC)     Chronic combined systolic and diastolic CHF, NYHA class 2 (HCC)     History of non-Hodgkin's lymphoma 2006    treated with radiation and chemo    Systemic lupus     followed by Dr. Harley Hallmark        Past Surgical History:   Procedure Laterality Date    JOINT REPLACEMENT      KNEE ARTHROPLASTY Left 10/25/2016    left total knee replacement using computer navigation ([employer])    KNEE SURGERY Left     ACL and meniscus repair    LEEP      in 1990's       Allergies   Allergen Reactions    Sulfa Antibiotics Rash       Social History:  Reviewed.  reports that she quit smoking about 14 years ago. Her smoking use included cigarettes. She has a 9.00 pack-year smoking history. She has never used smokeless tobacco. She reports current alcohol use of about 1.0 standard drink per week. She reports that she does not use drugs.     Family History:  Reviewed. family history includes Cancer (age of onset: 47) in her sister; Dementia in her mother; High Cholesterol in her brother, brother, and sister; Other in her father; Thyroid Disease in her mother and sister.     Review of System:    Constitutional: No fevers, chills.   Eyes: No visual changes or diplopia. No scleral icterus.  ENT: No Headaches. No mouth sores or sore throat.  Cardiovascular: No for chest pain, Yes for dyspnea on exertion, No for palpitations or No for loss of consciousness. No cough, hemoptysis, No for pleuritic pain, or phlebitis.  Respiratory: No for cough or wheezing. No hematemesis.    Gastrointestinal: No abdominal pain, blood in stools.   Genitourinary: No dysuria, or hematuria.  Musculoskeletal: No gait disturbance,    Integumentary: No rash or pruritis.  Neurological: No headache, change in muscle strength, numbness or tingling.   Psychiatric: No anxiety, or  depression.  Endocrine: No temperature intolerance. No excessive thirst, fluid intake, or urination.   Hem/Lymph: No abnormal bruising or bleeding, blood clots or swollen lymph nodes.  Allergic/Immunologic: No nasal congestion or hives.    Physical Examination:  Vitals:    03/16/21 1537   BP: 98/64   Pulse: 90         Wt Readings from Last 3 Encounters:   03/16/21 288 lb (130.6 kg)   03/03/21 289 lb (131.1 kg)   02/24/21 286 lb 12.8 oz (130.1 kg)       Constitutional: Oriented. No distress.   Head: Normocephalic and atraumatic.   Mouth/Throat: Oropharynx is clear and moist.   Eyes: Conjunctivae clear without jaunduice. PERRL.  Neck: Neck supple. No rigidity. No JVD present.    Cardiovascular: Normal rate, regular rhythm, S1&S2.    Pulmonary/Chest: Bilateral respiratory sounds. No wheezes, No rhonchi.    Abdominal: Soft. Bowel sounds present. No distension, No tenderness.   Musculoskeletal: No tenderness. No edema    Lymphadenopathy: Has no cervical adenopathy.   Neurological: Alert and oriented. Cranial nerve appears intact, No Gross deficit   Skin: Skin is warm and dry. No rash noted.   Psychiatric: Has a normal mood, affect and behavior     Labs:  Reviewed.   No results for input(s): NA, K, CL, CO2, PHOS, BUN, CREATININE, CA in the last 72 hours.    Invalid input(s):  TSH  No results for input(s): WBC, HGB, HCT, MCV, PLT in the last 72 hours.  Lab Results   Component Value Date/Time    CKTOTAL 106 11/29/2013 10:44 PM    TROPONINI <0.01 02/10/2021 11:57 PM     No results found for: BNP  Lab Results   Component Value Date/Time    PROTIME 15.4 02/15/2021 11:17 AM    PROTIME 15.8 02/10/2021 10:37 AM    PROTIME 11.8 10/25/2016 11:37 AM    INR 1.23 02/15/2021 11:17 AM    INR 1.27 02/10/2021 10:37 AM    INR 1.04 10/25/2016 11:37 AM     Lab Results   Component Value Date/Time    CHOL 249 03/04/2021 12:44 PM    HDL 44 03/04/2021 12:44 PM    TRIG 146 03/04/2021 12:44 PM       ECG: Personally reviewed: NSR, HR 90, PR 184,  QRS 114, QTc 438    ECHO: 02/05/2021  Summary   Left ventricular cavity size is severely dilated.   Normal left ventricular wall thickness.   Ejection fraction is visually estimated to be <20%.   There is severe diffuse hypokinesis.   Global Longitiudinal Strain= -5.0%   Grade III diastolic dysfunction with elevated LV filling pressures.   Moderate thickening of leaflets of mitral valve.   moderate mitral regurgitation.   Mildly dilated right ventricle.   Right ventricular systolic function is severely reduced.   The tricuspid valve was not well visualized.   moderate tricuspid regurgitation.   The right atrium is moderately dilated.   IVC size is dilated (>2.1 cm) and collapses < 50% with respiration   consistent with elevated RA pressure (15 mmHg).   Estimated pulmonary artery systolic pressure is mildly elevated at 40 mmHg   assuming a right atrial pressure of 15 mmHg.    Stress Test: N/A    Cardiac Angiography: N/A    Problem List:   Patient Active Problem List    Diagnosis Date Noted    Systemic lupus erythematosus (HCC) 09/22/2014    Atrial fibrillation and flutter (HCC)     Paroxysmal atrial fibrillation (HCC)     Atrial flutter with rapid ventricular response (HCC)     Dilated cardiomyopathy (  HCC)     Acute respiratory failure with hypoxia (HCC)     Lactic acidosis     CHF (congestive heart failure), NYHA class I, acute on chronic, combined (HCC) 02/09/2021    Acute on chronic congestive heart failure (HCC) 02/01/2021    Shortness of breath 01/29/2021    Acute bacterial sinusitis 12/30/2020    Seasonal allergies 05/08/2019    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 Typical atrial flutter (HCC)    2. Paroxysmal atrial  fibrillation (HCC)    3. On continuous oral anticoagulation    4. On amiodarone therapy    5. Dilated cardiomyopathy (HCC)    6. Chronic systolic CHF (congestive heart failure) (HCC)         Cardiac HX: Kayleen A Bottger is a 60 y.o. woman with a h/o morbid obesity, NHL of the L groin (2006), s/p chemo (2006-2007), SLE, RA, who p/w ongoing SOB, cough, BLE edema for approx prior 30 days, was hypotensive, BNP 4700, found to have an EF<20% per echo, grade III DD, found to be in AFL with a HR of 127 bpm, CT neg for PE, poss elevated R sided pressures, developed hypoxia on 02/10/2021 that resolved with NRB, s/p RFA of AFL (02/12/2021), became hypoxic post procedure and was unable to be intubated, developed AF, placed on an amio gtt, and pressors, extubated 6/27, changed to po amio, found to be COVID +.    CHA2DS2-VASc 2. TSH 1.6 (02/10/2021).      pAF/AFL  - In NSR  - S/p RFA of AFL w/ pAF noted  - On amio 200 mg daily - screening labs stable  - On Eliquis 5 mg - no s/s bleeding - continue  - Reviewed recent labs  - 2 week CAM  - Follow-up with Dr. Larwance Sachs in 2 months     CMP  - EF <20%  - NYHA class II/III  - QRS 104  - On Toprol XL 25 mg QD  - Not tolerating losartan - hold losartan for now  - Monitor BP's over the next 2 weeks and consider increasing Toprol XL  - Patient will need an ischemic eval - will review with Dr. Harrison Mons  - Repeat echo after OMT and isch eval  - LifeVest in place  - Following with Dr. Harrison Mons - has appmt 8/17  - Cardiac rehab  - ECG ordered and results personally reviewed     EF of <20%  ARB/BB for systolic HF  ASA and Statin for CAD  Anticoagulation for AF and heart failure  No tobacco use.      All questions and concerns were addressed to the patient/family. Alternatives to my treatment were discussed. The note was completed using EMR. Every effort was made to ensure accuracy; however, inadvertent computerized transcription errors may be present.     Patient received education regarding their diagnosis,  treatment and medications while in the office today.    Ferdinand Lango CNP  [facility]    I  have spent 50 minutes in care of the patient including direct face to face time, chart preparation, reviewing diagnostic testing, other provider notes and  coordinating patient care.

## 2021-03-16 NOTE — Progress Notes (Signed)
SM placed a CAM 03/16/2021  [id number]  @ 4:40 PM

## 2021-03-17 ENCOUNTER — Encounter: Payer: MEDICAID | Primary: Gerontology

## 2021-03-17 NOTE — Telephone Encounter (Signed)
From: Lambert Keto  To: Warnell Bureau  Sent: 03/17/2021 12:46 PM EDT  Subject: Lab work question    Was I supposed to get lab work done? Do I need an order for it ?

## 2021-03-17 NOTE — Telephone Encounter (Signed)
2 week CAM placed 03/16/2021 @4 :40 PM  [id number]

## 2021-03-18 ENCOUNTER — Encounter

## 2021-03-19 LAB — BASIC METABOLIC PANEL
Anion Gap: 15 (ref 3–16)
BUN: 11 mg/dL (ref 7–20)
CO2: 27 mmol/L (ref 21–32)
Calcium: 9.4 mg/dL (ref 8.3–10.6)
Chloride: 96 mmol/L — ABNORMAL LOW (ref 99–110)
Creatinine: 0.8 mg/dL (ref 0.6–1.1)
GFR African American: >60 (ref >60)
GFR Non-African American: >60 (ref >60)
Glucose: 94 mg/dL (ref 70–99)
Potassium: 3.9 mmol/L (ref 3.5–5.1)
Sodium: 138 mmol/L (ref 136–145)

## 2021-03-19 LAB — T4, FREE: T4 Free: 1.4 ng/dL (ref 0.9–1.8)

## 2021-03-19 LAB — TSH WITH REFLEX: TSH: 4.42 u[IU]/mL — ABNORMAL HIGH (ref 0.27–4.20)

## 2021-03-30 ENCOUNTER — Inpatient Hospital Stay: Admit: 2021-03-30 | Payer: MEDICAID | Primary: Gerontology

## 2021-03-30 DIAGNOSIS — I5043 Acute on chronic combined systolic (congestive) and diastolic (congestive) heart failure: Secondary | ICD-10-CM

## 2021-03-31 ENCOUNTER — Inpatient Hospital Stay: Admit: 2021-03-31 | Payer: MEDICAID | Primary: Gerontology

## 2021-04-02 ENCOUNTER — Inpatient Hospital Stay: Admit: 2021-04-02 | Payer: MEDICAID | Primary: Gerontology

## 2021-04-05 ENCOUNTER — Encounter: Payer: MEDICAID | Attending: Gerontology | Primary: Gerontology

## 2021-04-05 ENCOUNTER — Inpatient Hospital Stay: Admit: 2021-04-05 | Payer: MEDICAID | Primary: Gerontology

## 2021-04-05 ENCOUNTER — Ambulatory Visit: Admit: 2021-04-05 | Discharge: 2021-04-05 | Payer: MEDICAID | Attending: Gerontology | Primary: Gerontology

## 2021-04-05 DIAGNOSIS — Z09 Encounter for follow-up examination after completed treatment for conditions other than malignant neoplasm: Secondary | ICD-10-CM

## 2021-04-05 NOTE — Assessment & Plan Note (Signed)
Monitored by specialist- no acute findings meriting change in the plan

## 2021-04-05 NOTE — Assessment & Plan Note (Signed)
Rhythm regular and rate controlled today no recurrence of A. fib denies any palpitations

## 2021-04-05 NOTE — Progress Notes (Signed)
Post-Discharge Transitional Care  Follow Up      Molly [name]   Date of Birth:  1960/09/11    Date of Office Visit:  04/05/2021  Date of Hospital Admission: 02/09/21  Date of Hospital Discharge: 02/24/21  Risk of hospital readmission (high >=14%. Medium >=10%) :Readmission Risk Score: 9.3 ( )      Care management risk score Rising risk (score 2-5) and Complex Care (Scores >=6): No Risk Score On File     Non face to face  following discharge, date last encounter closed (first attempt may have been earlier): *No documented post hospital discharge outreach found in the last 14 days    Call initiated 2 business days of discharge: *No response recorded in the last 14 days    ASSESSMENT/PLAN:   Hospital discharge follow-up  -     PR DISCHARGE MEDS RECONCILED W/ CURRENT OUTPATIENT MED LIST  CHF (congestive heart failure), NYHA class I, acute on chronic, combined (HCC)  Assessment & Plan:   Monitored by specialist- no acute findings meriting change in the plan  Atrial flutter with rapid ventricular response (HCC)  Assessment & Plan:   Rhythm regular and rate controlled today no recurrence of A. fib denies any palpitations  Dilated cardiomyopathy (HCC)  Assessment & Plan:   Monitored by specialist- no acute findings meriting change in the plan  Paroxysmal atrial fibrillation (HCC)  Assessment & Plan:   Monitored by specialist- no acute findings meriting change in the plan    Medical Decision Making: high complexity  No follow-ups on file.    On this date 04/05/2021 I have spent 45 minutes reviewing previous notes, test results and face to face with the patient discussing the diagnosis and importance of compliance with the treatment plan as well as documenting on the day of the visit.       Subjective:   HPI:  Follow up of Hospital problems/diagnosis(es): Acute on chronic congestive heart failure, acute respiratory failure with hypoxia, atrial flutter with RVR, dilated cardiomyopathy, paroxysmal atrial fibrillation    Inpatient  course: Discharge summary reviewed- see chart.    Interval history/Current status: Since discharge from the hospital she is doing much better.  She is down about 40 pounds in water weight.  She has followed with cardiology and pulmonology and is tolerating her medications well.  She still has a LifeVest on and has not had any recurrence of atrial fibrillation after her ablation.  She states that she has been taking her medications as prescribed and doing well with these.  Her blood pressure was running a little soft so the cardiologist did decrease her losartan and stopped this.  She is still taking her metoprolol amiodarone and dig.  She is on Eliquis for the A. fib.  She states that her breathing is much improved she does still have a minor cough but it is significantly improved from prior to admission.    Patient Active Problem List   Diagnosis    History of non-Hodgkin's lymphoma    Impaired glucose tolerance    HLD (hyperlipidemia)    Vitamin D deficiency    Morbid obesity with BMI of 40.0-44.9, adult (HCC)    Systemic lupus erythematosus (HCC)    High risk medication use    Status post total left knee replacement    Seasonal allergies    Acute bacterial sinusitis    Shortness of breath    Acute on chronic congestive heart failure (HCC)    CHF (congestive  heart failure), NYHA class I, acute on chronic, combined (HCC)    Atrial flutter with rapid ventricular response (HCC)    Dilated cardiomyopathy (HCC)    Acute respiratory failure with hypoxia (HCC)    Lactic acidosis    Paroxysmal atrial fibrillation (HCC)    Atrial fibrillation and flutter (HCC)       Medications listed as ordered at the time of discharge from hospital     Medication List            Accurate as of April 05, 2021  2:39 PM. If you have any questions, ask your nurse or doctor.                CONTINUE taking these medications      albuterol sulfate HFA 108 (90 Base) MCG/ACT inhaler  Commonly known as: Ventolin HFA  Inhale 2 puffs into the lungs  4 times daily as needed for Wheezing     amiodarone 200 MG tablet  Commonly known as: CORDARONE  Take 1 tablet by mouth daily     apixaban 5 MG Tabs tablet  Commonly known as: ELIQUIS  Take 1 tablet by mouth 2 times daily     digoxin 125 MCG tablet  Commonly known as: LANOXIN  Take 1 tablet by mouth in the morning.     DULoxetine 60 MG extended release capsule  Commonly known as: CYMBALTA  Take 1 capsule by mouth daily     furosemide 40 MG tablet  Commonly known as: Lasix  Take 1 tablet by mouth daily     melatonin 5 MG Tbdp disintegrating tablet  Take 1 tablet by mouth nightly     metoprolol succinate 25 MG extended release tablet  Commonly known as: TOPROL XL  Take 1 tablet by mouth in the morning.            STOP taking these medications      losartan 25 MG tablet  Commonly known as: COZAAR  Stopped by: Lindell Spar, APRN - CNP                Medications marked "taking" at this time  Outpatient Medications Marked as Taking for the 04/05/21 encounter (Office Visit) with Lindell Spar, APRN - CNP   Medication Sig Dispense Refill    digoxin (LANOXIN) 125 MCG tablet Take 1 tablet by mouth in the morning. 30 tablet 3    metoprolol succinate (TOPROL XL) 25 MG extended release tablet Take 1 tablet by mouth in the morning. 30 tablet 3    amiodarone (CORDARONE) 200 MG tablet Take 1 tablet by mouth daily 30 tablet 5    apixaban (ELIQUIS) 5 MG TABS tablet Take 1 tablet by mouth 2 times daily 60 tablet 2    melatonin 5 MG TBDP disintegrating tablet Take 1 tablet by mouth nightly      furosemide (LASIX) 40 MG tablet Take 1 tablet by mouth daily 60 tablet 3    albuterol sulfate HFA (VENTOLIN HFA) 108 (90 Base) MCG/ACT inhaler Inhale 2 puffs into the lungs 4 times daily as needed for Wheezing 54 g 1    DULoxetine (CYMBALTA) 60 MG extended release capsule Take 1 capsule by mouth daily 30 capsule 5        Medications patient taking as of now reconciled against medications ordered at time of hospital discharge:  Yes    A comprehensive review of systems was negative except for what was noted in the HPI.  Objective:    BP 122/80 (Site: Left Upper Arm, Position: Sitting, Cuff Size: Large Adult)    Pulse 91    Temp 97.8 ??F (36.6 ??C)    Ht 5' 10.5" (1.791 m)    Wt 286 lb (129.7 kg)    SpO2 97%    BMI 40.46 kg/m??   General Appearance: alert and oriented to person, place and time, well developed and well- nourished, in no acute distress  Skin: warm and dry, no rash or erythema  Head: normocephalic and atraumatic  Eyes: pupils equal, round, and reactive to light, extraocular eye movements intact, conjunctivae normal  ENT: tympanic membrane, external ear and ear canal normal bilaterally, nose without deformity, nasal mucosa and turbinates normal without polyps  Neck: supple and non-tender without mass, no thyromegaly or thyroid nodules, no cervical lymphadenopathy  Pulmonary/Chest: clear to auscultation bilaterally- no wheezes, rales or rhonchi, normal air movement, no respiratory distress  Cardiovascular: normal rate, regular rhythm, normal S1 and S2, no murmurs, rubs, clicks, or gallops, distal pulses intact, no carotid bruits  Abdomen: soft, non-tender, non-distended, normal bowel sounds, no masses or organomegaly  Extremities: no cyanosis, clubbing or edema  Musculoskeletal: normal range of motion, no joint swelling, deformity or tenderness  Neurologic: reflexes normal and symmetric, no cranial nerve deficit, gait, coordination and speech normal      An electronic signature was used to authenticate this note.  --Lindell Spar, APRN - CNP

## 2021-04-07 ENCOUNTER — Inpatient Hospital Stay: Admit: 2021-04-08 | Payer: MEDICAID | Primary: Gerontology

## 2021-04-07 ENCOUNTER — Ambulatory Visit: Admit: 2021-04-07 | Discharge: 2021-04-07 | Payer: MEDICAID | Attending: Cardiovascular Disease | Primary: Gerontology

## 2021-04-07 DIAGNOSIS — I5022 Chronic systolic (congestive) heart failure: Secondary | ICD-10-CM

## 2021-04-07 NOTE — Progress Notes (Signed)
Cc: HFrEF, PAFL with RVR    HPI:     Molly [name] is a 59 y.o. female with a past medical history of morbid obesity (BMI 43), non-Hodgkin's lymphoma status post chemotherapy 2006- 2007, SLE, RA, HFrEF, paroxysmal atrial flutter with RVR s/p ablation 02/12/21 by Dr. Clarise Cruz.     Patient reports she had MUGA test prior to chemotherapy and during treatment, last MUGA test was 2 years after her last chemotherapy and cardiac function was reportedly normal.    Was admitted at the [facility] 6/21 - 02/24/2021 for cardiogenic shock, atrial flutter with RVR s/p ablation on 6/24.     ECG 02/09/2021: Atrial flutter/coarse A. fib with RVR 138 bpm.     Echo 02/05/2021: LVD 6.8 cm, LVEF less than 20%, diastolic grade 3, increased LVEDP, severe RV dilatation and dysfunction, moderate MR/TR, RVSP 40 (15).  Images were personally reviewed.    No ischemic evaluation.    Her losartan was stopped on 7/26 after she reported significant orthostatic hypotension on losartan 12.5 mg daily.      Patient returns to the clinic today for follow-up. She now reports no symptoms including orthostasis.  Her weight has been slowly decreasing since discharge from the hospital.  She continues to wear the LifeVest.  Labs from 03/18/2021 were reviewed, creatinine 1.8, bicarb 27, K3.9 (obtained 2 days after stopping losartan).         Histories     Past Medical History:   has a past medical history of Amenorrhea, Cancer (HCC), Chronic combined systolic and diastolic CHF, NYHA class 2 (HCC), History of non-Hodgkin's lymphoma, and Systemic lupus.    Surgical History:   has a past surgical history that includes LEEP; knee surgery (Left); Knee Arthroplasty (Left, 10/25/2016); and joint replacement.     Social History:   reports that she quit smoking about 14 years ago. Her smoking use included cigarettes. She has a 9.00 pack-year smoking history. She has never used smokeless tobacco. She reports current alcohol use of about 1.0 standard drink per week.  She reports that she does not use drugs.     Family History:  No evidence for sudden cardiac death or premature CAD      Medications:     Home medications were reviewed and are listed below    Prior to Admission medications    Medication Sig Start Date End Date Taking? Authorizing Provider   digoxin (LANOXIN) 125 MCG tablet Take 1 tablet by mouth in the morning. 03/15/21  Yes Lindell Spar, APRN - CNP   metoprolol succinate (TOPROL XL) 25 MG extended release tablet Take 1 tablet by mouth in the morning. 03/15/21  Yes Lindell Spar, APRN - CNP   amiodarone (CORDARONE) 200 MG tablet Take 1 tablet by mouth daily 03/03/21  Yes Edgar Frisk, APRN - CNP   apixaban (ELIQUIS) 5 MG TABS tablet Take 1 tablet by mouth 2 times daily 02/24/21  Yes Erin Black, DO   melatonin 5 MG TBDP disintegrating tablet Take 1 tablet by mouth nightly 02/24/21  Yes Erin Black, DO   furosemide (LASIX) 40 MG tablet Take 1 tablet by mouth daily 02/01/21  Yes Lauren Marcelino Scot, APRN - CNP   albuterol sulfate HFA (VENTOLIN HFA) 108 (90 Base) MCG/ACT inhaler Inhale 2 puffs into the lungs 4 times daily as needed for Wheezing 12/30/20  Yes Lauren Marcelino Scot, APRN - CNP   DULoxetine (CYMBALTA) 60 MG extended release capsule Take 1 capsule by mouth daily  04/18/18 04/07/21 Yes Harley Hallmark, MD          Allergy:     Sulfa antibiotics       Review of Systems:     All 12 point review of symptoms completed. Pertinent positives identified in the HPI, all other review of symptoms negative as below.    CONSTITUTIONAL: No fatigue  SKIN: No rash or pruritis.  EYES: No visual changes or diplopia. No scleral icterus.  ENT: No Headaches, hearing loss or vertigo. No mouth sores or sore throat.  CARDIOVASCULAR: No chest pain/chest pressure/chest discomfort. No palpitations. No edema.   RESPIRATORY: No dyspnea. No cough or wheezing, no sputum production.  GASTROINTESTINAL: No N/V/D. No abdominal pain, appetite loss, blood in stools.  GENITOURINARY: No  dysuria, trouble voiding, or hematuria.  MUSCULOSKELETAL:  No gait disturbance, weakness or joint complaints.  NEUROLOGICAL: No headache, diplopia, change in muscle strength, numbness or tingling. No change in gait, balance, coordination, mood, affect, memory, mentation, behavior.  PSHYCH: No anxiety, loss of interest, change in sexual behavior, feelings of self-harm, or confusion.  ENDOCRINE: No excessive thirst, fluid intake, or urination. No tremor.  HEMATOLOGIC: No abnormal bruising or bleeding.  ALLERGY: No nasal congestion or hives.      Physical Examination:     Vitals:    04/07/21 1042   BP: 130/86   Pulse: 90   Weight: 286 lb (129.7 kg)       Wt Readings from Last 3 Encounters:   04/07/21 286 lb (129.7 kg)   04/05/21 286 lb (129.7 kg)   03/16/21 288 lb (130.6 kg)         General Appearance:  Alert, cooperative, no distress, appears stated age Appropriate weight   Head:  Normocephalic, without obvious abnormality, atraumatic   Eyes:  PERRL, conjunctiva/corneas clear EOM intact  Ears normal   Throat no lesions       Nose: Nares normal, no drainage or sinus tenderness   Throat: Lips, mucosa, and tongue normal   Neck: Supple, symmetrical, trachea midline, no adenopathy, thyroid: not enlarged, symmetric, no tenderness/mass/nodules, no carotid bruit       Lungs:   Clear to auscultation bilaterally, respirations unlabored   Chest Wall:  No tenderness or deformity   Heart:  Regular rhythm, rate is controlled, S1, S2 normal, there is no murmur, there is no rub or gallop, cannot assess jvd, no bilateral lower extremity edema   Abdomen:   Soft, non-tender, bowel sounds active all four quadrants,  no masses, no organomegaly       Extremities: Extremities normal, atraumatic, no cyanosis   Pulses: 2+ and symmetric   Skin: Skin color, texture, turgor normal, no rashes or lesions   Pysch: Normal mood and affect   Neurologic: Normal gross motor and sensory exam.  Cranial nerves intact        Labs:     Lab Results    Component Value Date    WBC 6.5 02/24/2021    HGB 14.5 02/24/2021    HCT 43.8 02/24/2021    MCV 88.2 02/24/2021    PLT 423 02/24/2021     Lab Results   Component Value Date    NA 138 03/18/2021    K 3.9 03/18/2021    CL 96 (L) 03/18/2021    CO2 27 03/18/2021    BUN 11 03/18/2021    CREATININE 0.8 03/18/2021    GLUCOSE 94 03/18/2021    CALCIUM 9.4 03/18/2021    PROT 7.5 03/04/2021  LABALBU 4.1 03/04/2021    BILITOT 0.8 03/04/2021    ALKPHOS 132 (H) 03/04/2021    AST 33 03/04/2021    ALT 28 03/04/2021    LABGLOM >60 03/18/2021    GFRAA >60 03/18/2021    AGRATIO 1.2 03/04/2021    GLOB 3.3 09/29/2015         Lab Results   Component Value Date    CHOL 249 (H) 03/04/2021    CHOL 121 02/10/2021    CHOL 245 (H) 04/24/2014     Lab Results   Component Value Date    TRIG 146 03/04/2021    TRIG 82 02/10/2021    TRIG 149 04/24/2014     Lab Results   Component Value Date    HDL 44 03/04/2021    HDL 33 (L) 02/10/2021    HDL 55 07/01/2019     Lab Results   Component Value Date    LDLCALC 176 (H) 03/04/2021    LDLCALC 72 02/10/2021    LDLCALC 149 (H) 07/01/2019     Lab Results   Component Value Date    LABVLDL 29 03/04/2021    LABVLDL 16 02/10/2021    LABVLDL 34 07/01/2019     No results found for: [facility]    Lab Results   Component Value Date    INR 1.23 (H) 02/15/2021    INR 1.27 (H) 02/10/2021    INR 1.04 10/25/2016    PROTIME 15.4 (H) 02/15/2021    PROTIME 15.8 (H) 02/10/2021    PROTIME 11.8 10/25/2016       The 10-year ASCVD risk score Denman Stepahnie Campo DC Jr., et al., 2013) is: 4%    Values used to calculate the score:      Age: 26 years      Sex: Female      Is Non-Hispanic African American: No      Diabetic: No      Tobacco smoker: No      Systolic Blood Pressure: 130 mmHg      Is BP treated: No      HDL Cholesterol: 44 mg/dL      Total Cholesterol: 249 mg/dL      Assessment / Plan:      Diagnosis Orders   1. Chronic systolic CHF (congestive heart failure) (HCC)  Echo limited           1.  New HFrEF:  It could be ischemic  (severe CAD) versus nonischemic (PAFL RVR, less likely postchemotherapy, viral).  Patient had atrial flutter ablation during her hospitalization in 01/2021.  She now remains in sinus rhythm.  She reports minimal symptoms with exertion. Her NYHA FC is II, stage C. TSH elevated but T4 normal.     - We will repeat a limited echo to reassess LVEF now that she is in sinus.  - Continue with Toprol 25 mg daily, dig 125 mcg daily and Lasix 40 mg daily  - If LVEF is not improving on current medications and while in sinus rhythm, will consider adding additional heart failure medications carefully (of note patient had significant orthostatic hypotension on low-dose losartan).  - If LVEF remains significantly depressed, will consider LHC to rule out significant underlying CAD.  - Continue with LifeVest at this time  - If LVEF remains < 35% after 90 days of GDMT, will discuss referral to EP for possible device placement. In the meantime, patient will need to continue LifeVest.     2.  Paroxysmal atrial flutter with RVR  s/p ablation:  Patient follows with EP.    - Continue with Eliquis 5 mg twice daily  - Continue with amiodarone 200 mg daily, dig and Toprol 25 daily.        We will schedule a follow up visit in 2 months      I have spent 42 minutes of face to face time with the patient with more than 50% spent counseling and coordinating care.       I have personally reviewed the reports and images of labs, radiological studies, cardiac studies including ECG's and telemetry, current and old medical records. The note was completed using EMR and Dragon dictation system. Every effort was made to ensure accuracy; however, inadvertent computerized transcription errors may be present.    All questions and concerns were addressed to the patient/family. Alternatives to my treatment were discussed.     I would like to thank you for providing me the opportunity to participate in the care of your patient. If you have any questions, please  do not hesitate to contact me.     Laurette SchimkeGeorge E. Zonya Gudger, MD, [facility] Summit Medical CenterFACC, FHFSA  [facility]  [address]  [address]  [place] [id number]  Main Office Phone: [phone]  Fax: [phone]

## 2021-04-09 ENCOUNTER — Inpatient Hospital Stay: Admit: 2021-04-09 | Payer: MEDICAID | Primary: Gerontology

## 2021-04-12 ENCOUNTER — Inpatient Hospital Stay: Admit: 2021-04-12 | Payer: MEDICAID | Primary: Gerontology

## 2021-04-14 ENCOUNTER — Inpatient Hospital Stay: Admit: 2021-04-14 | Payer: MEDICAID | Primary: Gerontology

## 2021-04-16 ENCOUNTER — Inpatient Hospital Stay: Admit: 2021-04-16 | Payer: MEDICAID | Primary: Gerontology

## 2021-04-19 ENCOUNTER — Inpatient Hospital Stay: Admit: 2021-04-19 | Payer: MEDICAID | Primary: Gerontology

## 2021-04-21 ENCOUNTER — Inpatient Hospital Stay: Admit: 2021-04-21 | Payer: MEDICAID | Primary: Gerontology

## 2021-04-22 ENCOUNTER — Ambulatory Visit: Admit: 2021-04-22 | Discharge: 2021-04-22 | Payer: MEDICAID | Primary: Gerontology

## 2021-04-22 DIAGNOSIS — I5022 Chronic systolic (congestive) heart failure: Secondary | ICD-10-CM

## 2021-04-22 LAB — ECHOCARDIOGRAM LIMITED: Left Ventricular Ejection Fraction: 43

## 2021-04-22 NOTE — Other (Signed)
Please let patient know that her cardiac function is improving with LVEF 40-45% at this time consistent with mild heart failure (compared to LVEF 20% and consistent with severe heart failure in 01/2021). We will continue her meds unchanged, no need for defibrillator.

## 2021-04-23 ENCOUNTER — Inpatient Hospital Stay: Admit: 2021-04-23 | Payer: MEDICAID | Primary: Gerontology

## 2021-04-23 DIAGNOSIS — I5043 Acute on chronic combined systolic (congestive) and diastolic (congestive) heart failure: Secondary | ICD-10-CM

## 2021-04-27 NOTE — Other (Signed)
Patient notified

## 2021-04-28 ENCOUNTER — Inpatient Hospital Stay: Admit: 2021-04-28 | Payer: MEDICAID | Primary: Gerontology

## 2021-04-30 ENCOUNTER — Inpatient Hospital Stay: Admit: 2021-04-30 | Payer: MEDICAID | Primary: Gerontology

## 2021-05-03 ENCOUNTER — Inpatient Hospital Stay: Admit: 2021-05-03 | Payer: MEDICAID | Primary: Gerontology

## 2021-05-05 ENCOUNTER — Inpatient Hospital Stay: Admit: 2021-05-05 | Payer: MEDICAID | Primary: Gerontology

## 2021-05-07 ENCOUNTER — Inpatient Hospital Stay: Admit: 2021-05-07 | Payer: MEDICAID | Primary: Gerontology

## 2021-05-10 ENCOUNTER — Inpatient Hospital Stay: Admit: 2021-05-10 | Payer: MEDICAID | Primary: Gerontology

## 2021-05-11 ENCOUNTER — Ambulatory Visit: Admit: 2021-05-11 | Discharge: 2021-05-11 | Payer: MEDICAID | Attending: Internal Medicine | Primary: Gerontology

## 2021-05-11 DIAGNOSIS — I480 Paroxysmal atrial fibrillation: Secondary | ICD-10-CM

## 2021-05-11 MED ORDER — SACUBITRIL-VALSARTAN 24-26 MG PO TABS
24-26 MG | ORAL_TABLET | Freq: Two times a day (BID) | ORAL | 3 refills | Status: DC
Start: 2021-05-11 — End: 2021-05-28

## 2021-05-11 NOTE — Progress Notes (Signed)
[facility]   Cardiac Electrophysiology Consultation   Date: 05/11/2021  Reason for Consultation:   Consult Requesting Physician: No att. providers found     Chief Complaint:   Chief Complaint   Patient presents with    Follow-up     8 Week follow up       HPI: Molly [name] is a 60 y.o. female with PMH significant for morbid obesity, NHL of the L groin, s/p chemo (2006-2007), SLE, RA, who p/w ongoing SOB, cough, BLE edema for approx prior 30 days, was hypotensive and found to have EF<20% per echo (01/2021), grade III DD, also found to be in AFL RVR, s/p RFA of AFL (02/12/21), became hypoxic post procedure and was unable to be extubated, developed AF, placed on amiodarone, found to be COVID positive. Monitor (02/2021) showed brief PAT, but no AF/AFL. Repeat echo (04/23/21) showed EF improved to 40-45%.    Interval History:   Today, she is here for 3 mo f/u, presenting in SR. She has been feeling better since her hospitalization. Denies complaints of palpitations, dizziness, CP, SOB, orthopnea, presyncope, or syncope. 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    Assessment:  PAF, on Eliquis, amiodarone, digoxin  AFL, s/p AFL ablation (01/2021)  NICMP, EF improved to 40-45%, on Toprol, no ACE/ARB due to orthostatic hypotension    Plan:  Stop amiodarone and digoxin  Start Entresto 24-26 mg BID  Continue Eliquis 5 mg BID, Toprol 25 mg daily, Lasix 40 mg daily  She may return to work  Follow up in 3 months with EP NP with possible up-titration of Entresto    Past Medical History:   Diagnosis Date    Amenorrhea     Cancer (HCC)     Chronic combined systolic and diastolic CHF, NYHA class 2 (HCC)     History of non-Hodgkin's lymphoma 2006    treated with radiation and chemo    Systemic lupus     followed by Dr. Harley Hallmark        Past Surgical History:   Procedure Laterality Date    JOINT REPLACEMENT      KNEE ARTHROPLASTY Left 10/25/2016    left total knee replacement using computer  navigation ([employer])    KNEE SURGERY Left     ACL and meniscus repair    LEEP      in 1990's       Allergies:  Allergies   Allergen Reactions    Sulfa Antibiotics Rash       Medication:   Prior to Admission medications    Medication Sig Start Date End Date Taking? Authorizing Provider   digoxin (LANOXIN) 125 MCG tablet Take 1 tablet by mouth in the morning. 03/15/21  Yes Lindell Spar, APRN - CNP   metoprolol succinate (TOPROL XL) 25 MG extended release tablet Take 1 tablet by mouth in the morning. 03/15/21  Yes Lindell Spar, APRN - CNP   amiodarone (CORDARONE) 200 MG tablet Take 1 tablet by mouth daily 03/03/21  Yes Edgar Frisk, APRN - CNP   apixaban (ELIQUIS) 5 MG TABS tablet Take 1 tablet by mouth 2 times daily 02/24/21  Yes Erin Black, DO   melatonin 5 MG TBDP disintegrating tablet Take 1 tablet by mouth nightly 02/24/21  Yes Erin Black, DO   furosemide (LASIX) 40 MG tablet Take 1 tablet by mouth daily 02/01/21  Yes Lindell Spar, APRN -  CNP   albuterol sulfate HFA (VENTOLIN HFA) 108 (90 Base) MCG/ACT inhaler Inhale 2 puffs into the lungs 4 times daily as needed for Wheezing 12/30/20  Yes Lauren Marcelino Scot, APRN - CNP   DULoxetine (CYMBALTA) 60 MG extended release capsule Take 1 capsule by mouth daily 04/18/18 05/11/21 Yes Harley Hallmark, MD       Social History:   reports that she quit smoking about 14 years ago. Her smoking use included cigarettes. She has a 9.00 pack-year smoking history. She has never used smokeless tobacco. She reports current alcohol use of about 1.0 standard drink per week. She reports that she does not use drugs.        Family History:  family history includes Cancer (age of onset: 29) in her sister; Dementia in her mother; High Cholesterol in her brother, brother, and sister; Other in her father; Thyroid Disease in her mother and sister.   Reviewed. Denies family history of sudden cardiac death, arrhythmia, premature CAD    Review of System:    General ROS: negative  for - chills, fever   Psychological ROS: negative for - anxiety or depression  Ophthalmic ROS: negative for - eye pain or loss of vision  ENT ROS: negative for - epistaxis, headaches, nasal discharge, sore throat   Allergy and Immunology ROS: negative for - hives, nasal congestion   Hematological and Lymphatic ROS: negative for - bleeding problems, blood clots, bruising or jaundice  Endocrine ROS: negative for - skin changes, temperature intolerance or unexpected weight changes  Respiratory ROS: negative for - cough, hemoptysis, pleuritic pain, SOB, sputum changes or wheezing  Cardiovascular ROS: Per HPI.   Gastrointestinal ROS: negative for - abdominal pain, blood in stools, diarrhea, hematemesis, melena, nausea/vomiting or swallowing difficulty/pain  Genito-Urinary ROS: negative for - dysuria or incontinence  Musculoskeletal ROS: negative for - joint swelling or muscle pain  Neurological ROS: negative for - confusion, dizziness, gait disturbance, headaches, numbness/tingling, seizures, speech problems, tremors, visual changes or weakness  Dermatological ROS: negative for - rash    Physical Examination:  Vitals:    05/11/21 1331   BP: 100/82   Pulse: 78       Constitutional: Oriented. No distress.   Head: Normocephalic and atraumatic.   Mouth/Throat: Oropharynx is clear and moist.   Eyes: Conjunctivae normal. EOM are normal.   Neck: Normal range of motion. Neck supple. No rigidity.  No JVD present.   Cardiovascular: Normal rate, regular rhythm, S1&S2 and intact distal pulses.   Pulmonary/Chest: Bilateral respiratory sounds. No wheezes. No rhonchi.    Abdominal: Soft. Bowel sounds present. No distension, No tenderness.   Musculoskeletal: No tenderness. No edema    Lymphadenopathy: Has no cervical adenopathy.   Neurological: Alert and oriented. Cranial nerve appears intact, No Gross deficit   Skin: Skin is warm and dry. No rash noted.   Psychiatric: Has a normal mood, affect and behavior     Labs:  Reviewed.     ECG:  reviewed, SR 79     Studies:   1. 14 day CAM (7/26-03/30/21): SR with average HR 84 (66-127), 22 runs of AT with longest lasting 5 beats, <1% PAC, <1% PVC, no symptoms    2. Limited Echo 04/22/21:   Summary   Normal left ventricle size. There is mild concentric left ventricular   hypertrophy. Overall left ventricular systolic function appears mildly   reduced with an ejection fraction of 40-45%. Mild global hypokinesis.    Echo 02/05/21: EF <20%  The MCOT, echocardiogram, stress test, and coronary angiography/PCI were reviewed by myself and used for my plan of care.     Thank you for allowing me to participate in the care of Molly [name]. All questions and concerns were addressed to the patient/family. Alternatives to my treatment were discussed.     Leslye Peer, RN, am scribing for and in the presence of Dr. Lenna Gilford. 05/11/21 1:49 PM  Pearline Cables, RN    I, Guadelupe Sabin MD, personally performed the services prescribed in this documentation as scribed by Ms. Pearline Cables RN in my presence and it is both accurate and complete.     Guadelupe Sabin MD  Cardiac Electrophysiology  [facility]

## 2021-05-12 ENCOUNTER — Inpatient Hospital Stay: Admit: 2021-05-12 | Payer: MEDICAID | Primary: Gerontology

## 2021-05-14 ENCOUNTER — Inpatient Hospital Stay: Admit: 2021-05-14 | Payer: MEDICAID | Primary: Gerontology

## 2021-05-17 ENCOUNTER — Inpatient Hospital Stay: Admit: 2021-05-17 | Payer: MEDICAID | Primary: Gerontology

## 2021-05-17 NOTE — Telephone Encounter (Signed)
From: Lambert Keto  To: Warnell Bureau  Sent: 05/16/2021 6:24 PM EDT  Subject: Medicine Changes    Dr Erick Colace changed my meds on Tuesday. Started Lynne Logan. Discontinued 2 meds. I have been dizzy upon standing up and fatigued. Is this normal side effects? Will this disappear over time? Thanks.

## 2021-05-17 NOTE — Other (Signed)
Nutrition Education    Cardiac diet education provided using the following handouts as resources: Inflammation and Diet, Plant Based Foods, Savor the Spectrum, 20 Ways to Eat More Fruits and Vegetables, Healthy Eating Plate, Heart Healthy Fats, & Reducing Sodium While Enhancing Flavor.    All diet related questions/concerns were addressed. Patient verbalized understanding.    Duration: 30 minutes

## 2021-05-18 MED ORDER — METOPROLOL SUCCINATE ER 25 MG PO TB24
25 MG | ORAL_TABLET | Freq: Every day | ORAL | 3 refills | Status: DC
Start: 2021-05-18 — End: 2021-08-10

## 2021-05-18 NOTE — Telephone Encounter (Signed)
LOV: 04/05/2021  FOV: N/A

## 2021-05-19 ENCOUNTER — Encounter: Payer: MEDICAID | Primary: Gerontology

## 2021-05-19 ENCOUNTER — Inpatient Hospital Stay: Admit: 2021-05-19 | Payer: MEDICAID | Primary: Gerontology

## 2021-05-20 MED ORDER — APIXABAN 5 MG PO TABS
5 MG | ORAL_TABLET | Freq: Two times a day (BID) | ORAL | 5 refills | Status: AC
Start: 2021-05-20 — End: 2021-11-10

## 2021-05-20 NOTE — Telephone Encounter (Signed)
From: Lambert Keto  To: Warnell Bureau  Sent: 05/18/2021 9:44 AM EDT  Subject: Eliquis    Tried to refill and Venetia Maxon and my chart. cannot authorize refills either way. Can you help?   Don???t need till next week. Thanks

## 2021-05-20 NOTE — Telephone Encounter (Signed)
Can we start a PA for entresto? Tomma Lightning okay to fly?

## 2021-05-20 NOTE — Telephone Encounter (Signed)
Ok to fly with PA.

## 2021-05-21 ENCOUNTER — Encounter: Payer: MEDICAID | Primary: Gerontology

## 2021-05-24 ENCOUNTER — Inpatient Hospital Stay: Admit: 2021-05-24 | Payer: MEDICAID | Primary: Gerontology

## 2021-05-24 DIAGNOSIS — I5043 Acute on chronic combined systolic (congestive) and diastolic (congestive) heart failure: Secondary | ICD-10-CM

## 2021-05-26 ENCOUNTER — Inpatient Hospital Stay: Admit: 2021-05-26 | Payer: MEDICAID | Primary: Gerontology

## 2021-05-28 ENCOUNTER — Telehealth: Admit: 2021-05-28 | Discharge: 2021-05-28 | Payer: MEDICAID | Attending: Nurse Practitioner | Primary: Gerontology

## 2021-05-28 ENCOUNTER — Inpatient Hospital Stay: Admit: 2021-05-28 | Payer: MEDICAID | Primary: Gerontology

## 2021-05-28 MED ORDER — FUROSEMIDE 40 MG PO TABS
40 MG | ORAL_TABLET | Freq: Every day | ORAL | 3 refills | Status: DC
Start: 2021-05-28 — End: 2021-08-10

## 2021-05-28 NOTE — Progress Notes (Signed)
Consent:  She & health care decision maker is aware that that he may receive a bill for this virual service, depending on his insurance coverage, and has provided verbal consent to proceed: yes   Documentation:  I communicated with the patient and/or health care decision maker about our discussions of care.    Details of this discussion including any medical advice provided:    I affirm this is a Patient Initiated Episode with an Established Patient who has not had a related appointment within my department in the past 7 days or scheduled within the next 24 hours.   Total Time:30 minutes       [facility]        Edgar Frisk APRN FNP CVNP Cardiology   [name]   virtual visit  Note    CC: "Spoke with pt. UL started Entresto 24-26 mg BID on 9/20. FW cut the dose in half (9/26) due to dizziness and fatigue. Since taking the half dose, she has been checking orthostatic BPs and has noticed a drop (ie 90/60 to 80/62, sitting to standing). This past weekend, she stood up, became dizzy, and blacked out briefly, falling and hitting her head and spraining her ankle. She is also extremely fatigued. She doesn't want to continue the [facility] any more. She does have a h/o orthostatic hypotension with ACE/ARB. She has f/u with GB on 07/06/21. Offered sooner appt, but she will be out of town".    Interval Hx:  c/o hypotension, dizziness, lightness, c/o presyncope event, she felt she has been dehydrated from the entresto, states has felt worse since taking it,     Plan   She is going to hold entresto for now, she feels this is the problem the problem   She is going to hold lasix for one week and restart 20 mg lasix  daily instead of 40 mg daily  She states she has appt in Nov and to call us sooner if no improvement         PMH PAF, on Eliquis, amiodarone, digoxin AFL, s/p AFL ablation (01/2021) NICMP, EF improved to 40-45%, on Toprol, no ACE/ARB due to orthostatic hypotension   Plan: per Dr  Clarise Cruz 05/11/2021   Stop amiodarone and digoxin  Start Entresto 24-26 mg BID  Continue Eliquis 5 mg BID, Toprol 25 mg daily, Lasix 40 mg daily  She may return to work  Follow up in 3 months with EP NP with possible up-titration of Entresto    14 day CAM (7/26-03/30/21): SR with average HR 84 (66-127), 22 runs of AT with longest lasting 5 beats, <1% PAC, <1% PVC, no symptoms    TTE  04/22/21: Normal left ventricle size. There is mild concentric left ventricular   hypertrophy. Overall left ventricular systolic function appears mildly   reduced with an ejection fraction of 40-45%. Mild global hypokinesis.     Echo 02/05/21: EF <20%     I have reviewed notes / any lab work EKGs stress test, angiograms, & images reviewed  I  have spent 30 minutes with pt on phone or on video visit with the patient with more than 50% spent counseling and coordinating care this patient.        Objective:   There were no vitals taken for this visit.  No intake or output data in the 24 hours ending 05/28/21 1357  Wt Readings from Last 3 Encounters:   05/11/21 280 lb 6.4 oz (127.2 kg)  04/07/21 286 lb (129.7 kg)   04/05/21 286 lb (129.7 kg)       Physical Exam:   There were no vitals taken for this visit.  BP Readings from Last 3 Encounters:   05/11/21 100/82   04/07/21 130/86   04/05/21 122/80     Pulse Readings from Last 3 Encounters:   05/11/21 78   04/07/21 90   04/05/21 91     No intake or output data in the 24 hours ending 05/28/21 1357  Wt Readings from Last 2 Encounters:   05/11/21 280 lb 6.4 oz (127.2 kg)   04/07/21 286 lb (129.7 kg)     Constitutional: She is oriented to person, place, and time / in NAD   Vitals /wt per patient at home       [facility] APRN, CVNP

## 2021-05-28 NOTE — Telephone Encounter (Addendum)
Spoke with pt. UL started Entresto 24-26 mg BID on 9/20. FW cut the dose in half (9/26) due to dizziness and fatigue. Since taking the half dose, she has been checking orthostatic BPs and has noticed a drop (ie 90/60 to 80/62, sitting to standing). This past weekend, she stood up, became dizzy, and blacked out briefly, falling and hitting her head and spraining her ankle. She is also extremely fatigued. She doesn't want to continue the [facility] any more. She does have a h/o orthostatic hypotension with ACE/ARB. She has f/u with GB on 07/06/21. Offered sooner appt, but she will be out of town.

## 2021-05-31 ENCOUNTER — Inpatient Hospital Stay: Admit: 2021-05-31 | Payer: MEDICAID | Primary: Gerontology

## 2021-06-02 ENCOUNTER — Inpatient Hospital Stay: Admit: 2021-06-02 | Payer: MEDICAID | Primary: Gerontology

## 2021-06-04 ENCOUNTER — Inpatient Hospital Stay: Admit: 2021-06-04 | Payer: MEDICAID | Primary: Gerontology

## 2021-06-07 ENCOUNTER — Encounter: Payer: MEDICAID | Primary: Gerontology

## 2021-06-08 ENCOUNTER — Encounter: Payer: MEDICAID | Attending: Cardiovascular Disease | Primary: Gerontology

## 2021-06-10 ENCOUNTER — Inpatient Hospital Stay: Payer: MEDICAID | Primary: Gerontology

## 2021-06-11 ENCOUNTER — Encounter: Payer: MEDICAID | Primary: Gerontology

## 2021-06-14 ENCOUNTER — Inpatient Hospital Stay: Admit: 2021-06-14 | Payer: MEDICAID | Primary: Gerontology

## 2021-06-16 ENCOUNTER — Inpatient Hospital Stay: Admit: 2021-06-16 | Payer: MEDICAID | Primary: Gerontology

## 2021-06-18 ENCOUNTER — Inpatient Hospital Stay: Admit: 2021-06-18 | Payer: MEDICAID | Primary: Gerontology

## 2021-06-21 ENCOUNTER — Inpatient Hospital Stay: Admit: 2021-06-21 | Payer: MEDICAID | Primary: Gerontology

## 2021-06-23 ENCOUNTER — Inpatient Hospital Stay: Admit: 2021-06-23 | Payer: MEDICAID | Primary: Gerontology

## 2021-06-23 DIAGNOSIS — I5043 Acute on chronic combined systolic (congestive) and diastolic (congestive) heart failure: Secondary | ICD-10-CM

## 2021-06-25 ENCOUNTER — Inpatient Hospital Stay: Admit: 2021-06-25 | Payer: MEDICAID | Primary: Gerontology

## 2021-06-28 ENCOUNTER — Inpatient Hospital Stay: Admit: 2021-06-28 | Payer: MEDICAID | Primary: Gerontology

## 2021-06-30 ENCOUNTER — Inpatient Hospital Stay: Admit: 2021-06-30 | Payer: MEDICAID | Primary: Gerontology

## 2021-07-02 ENCOUNTER — Encounter: Payer: MEDICAID | Primary: Gerontology

## 2021-07-05 ENCOUNTER — Encounter: Payer: MEDICAID | Primary: Gerontology

## 2021-07-06 ENCOUNTER — Ambulatory Visit: Admit: 2021-07-06 | Discharge: 2021-07-06 | Payer: MEDICAID | Attending: Cardiovascular Disease | Primary: Gerontology

## 2021-07-06 DIAGNOSIS — I5022 Chronic systolic (congestive) heart failure: Secondary | ICD-10-CM

## 2021-07-06 NOTE — Progress Notes (Signed)
Cc: HFrEF, PAFL with RVR    HPI:     Molly [name] is a 60 y.o. female with a past medical history of morbid obesity (BMI 43), non-Hodgkin's lymphoma status post chemotherapy 2006- 2007, SLE, RA, HFrEF, paroxysmal atrial flutter with RVR s/p ablation 02/12/21 by Dr. Clarise Cruz.      Patient reports she had MUGA test prior to chemotherapy and during treatment, last MUGA test was 2 years after her last chemotherapy and cardiac function was reportedly normal.     Was admitted at the [facility] 6/21 - 02/24/2021 for cardiogenic shock, atrial flutter with RVR s/p ablation on 6/24.     ECG 02/09/2021: Atrial flutter/coarse A. fib with RVR 138 bpm.     Echo 02/05/2021: LVD 6.8 cm, LVEF less than 20%, diastolic grade 3, increased LVEDP, severe RV dilatation and dysfunction, moderate MR/TR, RVSP 40 (15).  Images were personally reviewed.     No ischemic evaluation.     Patient cannot tolerate Entresto and losartan due to significant orthostatic hypotension and/or syncope.    Echo 04/2021: Mild LVH, LVEF 40-45%.     Patient returns to the clinic today for follow-up.  She reports much improved symptoms (orthostatic lightheadedness).       Histories     Past Medical History:   has a past medical history of Amenorrhea, Cancer (HCC), Chronic combined systolic and diastolic CHF, NYHA class 2 (HCC), History of non-Hodgkin's lymphoma, and Systemic lupus.    Surgical History:   has a past surgical history that includes LEEP; knee surgery (Left); Knee Arthroplasty (Left, 10/25/2016); and joint replacement.     Social History:   reports that she quit smoking about 14 years ago. Her smoking use included cigarettes. She has a 9.00 pack-year smoking history. She has never used smokeless tobacco. She reports current alcohol use of about 1.0 standard drink per week. She reports that she does not use drugs.     Family History:  No evidence for sudden cardiac death or premature CAD      Medications:     Home medications were reviewed and  are listed below    Prior to Admission medications    Medication Sig Start Date End Date Taking? Authorizing Provider   furosemide (LASIX) 40 MG tablet Take 0.5 tablets by mouth daily 05/28/21  Yes Edgar Frisk, APRN - CNP   apixaban (ELIQUIS) 5 MG TABS tablet Take 1 tablet by mouth 2 times daily 05/20/21  Yes Warnell Bureau, APRN - CNP   metoprolol succinate (TOPROL XL) 25 MG extended release tablet Take 1 tablet by mouth daily 05/18/21  Yes Lauren Marcelino Scot, APRN - CNP   melatonin 5 MG TBDP disintegrating tablet Take 1 tablet by mouth nightly 02/24/21  Yes Erin Black, DO   albuterol sulfate HFA (VENTOLIN HFA) 108 (90 Base) MCG/ACT inhaler Inhale 2 puffs into the lungs 4 times daily as needed for Wheezing 12/30/20   Lindell Spar, APRN - CNP   DULoxetine (CYMBALTA) 60 MG extended release capsule Take 1 capsule by mouth daily 04/18/18 05/11/21  Harley Hallmark, MD          Allergy:     Sulfa antibiotics       Review of Systems:     All 12 point review of symptoms completed. Pertinent positives identified in the HPI, all other review of symptoms negative as below.    CONSTITUTIONAL: No fatigue  SKIN: No rash or pruritis.  EYES: No visual changes or diplopia.  No scleral icterus.  ENT: No Headaches, hearing loss or vertigo. No mouth sores or sore throat.  CARDIOVASCULAR: No chest pain/chest pressure/chest discomfort. No palpitations. No edema.   RESPIRATORY: No dyspnea. No cough or wheezing, no sputum production.  GASTROINTESTINAL: No N/V/D. No abdominal pain, appetite loss, blood in stools.  GENITOURINARY: No dysuria, trouble voiding, or hematuria.  MUSCULOSKELETAL:  No gait disturbance, weakness or joint complaints.  NEUROLOGICAL: No headache, diplopia, change in muscle strength, numbness or tingling. No change in gait, balance, coordination, mood, affect, memory, mentation, behavior.  PSHYCH: No anxiety, loss of interest, change in sexual behavior, feelings of self-harm, or confusion.  ENDOCRINE: No excessive  thirst, fluid intake, or urination. No tremor.  HEMATOLOGIC: No abnormal bruising or bleeding.  ALLERGY: No nasal congestion or hives.      Physical Examination:     Vitals:    07/06/21 1405   BP: 120/76   Pulse: 82   Weight: 277 lb 6.4 oz (125.8 kg)       Wt Readings from Last 3 Encounters:   07/06/21 277 lb 6.4 oz (125.8 kg)   05/11/21 280 lb 6.4 oz (127.2 kg)   04/07/21 286 lb (129.7 kg)         General Appearance:  Alert, cooperative, no distress, appears stated age Appropriate weight   Head:  Normocephalic, without obvious abnormality, atraumatic   Eyes:  PERRL, conjunctiva/corneas clear EOM intact  Ears normal   Throat no lesions       Nose: Nares normal, no drainage or sinus tenderness   Throat: Lips, mucosa, and tongue normal   Neck: Supple, symmetrical, trachea midline, no adenopathy, thyroid: not enlarged, symmetric, no tenderness/mass/nodules, no carotid bruit       Lungs:   Clear to auscultation bilaterally, respirations unlabored   Chest Wall:  No tenderness or deformity   Heart:  Regular rhythm, rate is controlled, S1, S2 normal, there is no murmur, there is no rub or gallop, cannot assess jvd, no bilateral lower extremity edema   Abdomen:   Soft, non-tender, bowel sounds active all four quadrants,  no masses, no organomegaly       Extremities: Extremities normal, atraumatic, no cyanosis   Pulses: 2+ and symmetric   Skin: Skin color, texture, turgor normal, no rashes or lesions   Pysch: Normal mood and affect   Neurologic: Normal gross motor and sensory exam.  Cranial nerves intact        Labs:     Lab Results   Component Value Date    WBC 6.5 02/24/2021    HGB 14.5 02/24/2021    HCT 43.8 02/24/2021    MCV 88.2 02/24/2021    PLT 423 02/24/2021     Lab Results   Component Value Date    NA 138 03/18/2021    K 3.9 03/18/2021    CL 96 (L) 03/18/2021    CO2 27 03/18/2021    BUN 11 03/18/2021    CREATININE 0.8 03/18/2021    GLUCOSE 94 03/18/2021    CALCIUM 9.4 03/18/2021    PROT 7.5 03/04/2021    LABALBU 4.1  03/04/2021    BILITOT 0.8 03/04/2021    ALKPHOS 132 (H) 03/04/2021    AST 33 03/04/2021    ALT 28 03/04/2021    LABGLOM >60 03/18/2021    GFRAA >60 03/18/2021    AGRATIO 1.2 03/04/2021    GLOB 3.3 09/29/2015         Lab Results   Component Value Date  CHOL 249 (H) 03/04/2021    CHOL 121 02/10/2021    CHOL 245 (H) 04/24/2014     Lab Results   Component Value Date    TRIG 146 03/04/2021    TRIG 82 02/10/2021    TRIG 149 04/24/2014     Lab Results   Component Value Date    HDL 44 03/04/2021    HDL 33 (L) 02/10/2021    HDL 55 07/01/2019     Lab Results   Component Value Date    LDLCALC 176 (H) 03/04/2021    LDLCALC 72 02/10/2021    LDLCALC 149 (H) 07/01/2019     Lab Results   Component Value Date    LABVLDL 29 03/04/2021    LABVLDL 16 02/10/2021    LABVLDL 34 07/01/2019     No results found for: [place]    Lab Results   Component Value Date    INR 1.23 (H) 02/15/2021    INR 1.27 (H) 02/10/2021    INR 1.04 10/25/2016    PROTIME 15.4 (H) 02/15/2021    PROTIME 15.8 (H) 02/10/2021    PROTIME 11.8 10/25/2016       The 10-year ASCVD risk score (Arnett DK, et al., 2019) is: 3.4%    Values used to calculate the score:      Age: 53 years      Sex: Female      Is Non-Hispanic African American: No      Diabetic: No      Tobacco smoker: No      Systolic Blood Pressure: 120 mmHg      Is BP treated: No      HDL Cholesterol: 44 mg/dL      Total Cholesterol: 249 mg/dL      Assessment / Plan:      Diagnosis Orders   1. Chronic systolic CHF (congestive heart failure) (HCC)             1.  New HFrEF:  It could be ischemic (severe CAD) versus nonischemic (PAFL RVR, less likely postchemotherapy, viral).  Patient had atrial flutter ablation during her hospitalization in 01/2021.  She now remains in sinus rhythm.  She reports minimal symptoms with exertion. Her NYHA FC is II, stage C. TSH elevated but T4 normal.  LVEF is now slowly improving.     - Continue with Toprol 25 mg daily  -  Wean Lasix 40 mg daily to off.   - Unable to start  ACEI, ARB, spironolactone, Entresto or SGLT2 due to hypotension.   -No need for LifeVest or ICD with LVEF > 35% and improving.      2.  Paroxysmal atrial flutter with RVR s/p ablation:  Patient follows with EP.    - Continue with Eliquis 5 mg twice daily  - Continue with Toprol 25 daily.        We will schedule a follow up visit in 6 months      I have spent 35 minutes of face to face time with the patient with more than 50% spent counseling and coordinating care.       I have personally reviewed the reports and images of labs, radiological studies, cardiac studies including ECG's and telemetry, current and old medical records. The note was completed using EMR and Dragon dictation system. Every effort was made to ensure accuracy; however, inadvertent computerized transcription errors may be present.    All questions and concerns were addressed to the patient/family. Alternatives to my treatment  were discussed.     I would like to thank you for providing me the opportunity to participate in the care of your patient. If you have any questions, please do not hesitate to contact me.     Hervey Ard, MD, [facility] [place]  [address]  [place] [address]  Main Office Phone: [phone]  Fax: [phone]

## 2021-07-07 ENCOUNTER — Encounter: Payer: MEDICAID | Primary: Gerontology

## 2021-07-09 ENCOUNTER — Encounter: Payer: MEDICAID | Primary: Gerontology

## 2021-07-12 ENCOUNTER — Encounter: Payer: MEDICAID | Primary: Gerontology

## 2021-07-14 ENCOUNTER — Encounter: Payer: MEDICAID | Primary: Gerontology

## 2021-07-16 ENCOUNTER — Encounter: Payer: MEDICAID | Primary: Gerontology

## 2021-07-19 ENCOUNTER — Encounter: Payer: MEDICAID | Primary: Gerontology

## 2021-07-21 ENCOUNTER — Encounter: Payer: MEDICAID | Primary: Gerontology

## 2021-08-10 ENCOUNTER — Ambulatory Visit: Admit: 2021-08-10 | Discharge: 2021-08-10 | Payer: MEDICAID | Attending: Nurse Practitioner | Primary: Gerontology

## 2021-08-10 DIAGNOSIS — I4892 Unspecified atrial flutter: Secondary | ICD-10-CM

## 2021-08-10 MED ORDER — FUROSEMIDE 40 MG PO TABS
40 MG | ORAL_TABLET | Freq: Every day | ORAL | 3 refills | Status: AC | PRN
Start: 2021-08-10 — End: 2021-09-27

## 2021-08-10 MED ORDER — METOPROLOL SUCCINATE ER 50 MG PO TB24
50 MG | ORAL_TABLET | Freq: Every day | ORAL | 3 refills | Status: DC
Start: 2021-08-10 — End: 2022-03-02

## 2021-08-10 NOTE — Progress Notes (Signed)
[facility]   Electrophysiology  Office Visit  Date: 08/10/2021    Chief Complaint   Patient presents with    Atrial Fibrillation    Atrial Flutter    Cardiomyopathy    Congestive Heart Failure    Hypertension       Cardiac HX: Molly [name] is a 60 y.o. woman with a h/o morbid obesity, NHL of the L groin (2006), s/p chemo (2006-2007), SLE, RA, who p/w ongoing SOB, cough, BLE edema for approx prior 30 days, was hypotensive, BNP 4700, found to have an EF<20% per echo, grade III DD, found to be in AFL with a HR of 127 bpm, CT neg for PE, poss elevated R sided pressures, developed hypoxia on 02/10/2021 that resolved with NRB, s/p RFA of AFL (02/12/2021), became hypoxic post procedure and was unable to be extubated, developed AF, placed on an amio gtt, and pressors, extubated 6/27, changed to po amio, found to be COVID +.  13-day CAM (03/16/2021 - 03/30/2021) showed an average HR 84 (66-127), NSR, 22 episodes of AT with the longest lasting 5 beats, no AF/AFL, limited echo (04/22/2021) showed an EF of 40 to 45%., taken off amio/dig (05/11/2021).      Interval History/HPI: Patient is here for follow-up for AF/AFL, NICMP chronic systolic CHF.  Patient presented to [facility] with progressively worsening shortness of breath ongoing for 6 weeks.  She had also noted a 40 pound weight gain at that time.  She had an output patient echo ordered by her PCP that showed an EF of less than 20% and she was referred to the emergency room.  She was noted to be in atrial flutter with a poorly controlled ventricular rate.  She was also noted to be fluid overloaded.  She did have a CT of the chest that was negative for PE however possible elevated right-sided pressures.  During her hospitalization on 02/10/2021 she developed hypoxia that resolved with a nonrebreather.  She then underwent a catheter ablation for atrial flutter on 02/12/2021.  Postprocedure she became hypoxic and was unable to be intubated.  She developed AF and was  placed on amiodarone drip along with pressors.  She was found to be COVID-positive.  She then wore an outpatient monitor in July 2022 that showed brief PAT however no AF or AFL.  She had a repeat echo in September 2022 that showed her EF had improved to 40 to 45%.  Her ZOLL LifeVest was discontinued at that time.  She was seen in the office on 05/11/2021 and was taken off of her amiodarone and digoxin.  Today she presents in normal sinus rhythm with 1 PVC.  She has not felt any heart racing, palpitations or irregular heartbeats.  Her blood pressure is on the high side today.  She feels like it is gone up since she was taken off of the amiodarone.  He does monitor her blood pressure at home.  She is trying to stay active and exercise.  She is back to work and is working a lot.  Remains on Eliquis and has had no issues with bleeding or dark tarry stools.  Denies any chest pain, shortness of breath, PND, orthopnea or lower extremity edema    Home medications:   Current Outpatient Medications on File Prior to Visit   Medication Sig Dispense Refill    apixaban (ELIQUIS) 5 MG TABS tablet Take 1 tablet by mouth 2 times daily 60 tablet 5    melatonin 5 MG  TBDP disintegrating tablet Take 1 tablet by mouth nightly      albuterol sulfate HFA (VENTOLIN HFA) 108 (90 Base) MCG/ACT inhaler Inhale 2 puffs into the lungs 4 times daily as needed for Wheezing 54 g 1    DULoxetine (CYMBALTA) 60 MG extended release capsule Take 1 capsule by mouth daily 30 capsule 5     No current facility-administered medications on file prior to visit.       Past Medical History:   Diagnosis Date    Amenorrhea     Cancer (HCC)     Chronic combined systolic and diastolic CHF, NYHA class 2 (HCC)     History of non-Hodgkin's lymphoma 2006    treated with radiation and chemo    Systemic lupus     followed by Dr. Harley Hallmark        Past Surgical History:   Procedure Laterality Date    JOINT REPLACEMENT      KNEE ARTHROPLASTY Left 10/25/2016    left total knee  replacement using computer navigation ([employer])    KNEE SURGERY Left     ACL and meniscus repair    LEEP      in 1990's       Allergies   Allergen Reactions    Sulfa Antibiotics Rash       Social History:  Reviewed.  reports that she quit smoking about 14 years ago. Her smoking use included cigarettes. She has a 9.00 pack-year smoking history. She has never used smokeless tobacco. She reports current alcohol use of about 1.0 standard drink per week. She reports that she does not use drugs.     Family History:  Reviewed. family history includes Cancer (age of onset: 1) in her sister; Dementia in her mother; High Cholesterol in her brother, brother, and sister; Other in her father; Thyroid Disease in her mother and sister.     Review of System:    Constitutional: No fevers, chills.   Eyes: No visual changes or diplopia. No scleral icterus.  ENT: No Headaches. No mouth sores or sore throat.  Cardiovascular: No for chest pain, Yes for dyspnea on exertion, No for palpitations or No for loss of consciousness. No cough, hemoptysis, No for pleuritic pain, or phlebitis.  Respiratory: No for cough or wheezing. No hematemesis.    Gastrointestinal: No abdominal pain, blood in stools.   Genitourinary: No dysuria, or hematuria.  Musculoskeletal: No gait disturbance,    Integumentary: No rash or pruritis.  Neurological: No headache, change in muscle strength, numbness or tingling.   Psychiatric: No anxiety, or depression.  Endocrine: No temperature intolerance. No excessive thirst, fluid intake, or urination.   Hem/Lymph: No abnormal bruising or bleeding, blood clots or swollen lymph nodes.  Allergic/Immunologic: No nasal congestion or hives.    Physical Examination:  Vitals:    08/10/21 1431   BP: 130/88   Pulse: 78         Wt Readings from Last 3 Encounters:   08/10/21 282 lb 12.8 oz (128.3 kg)   07/06/21 277 lb 6.4 oz (125.8 kg)   05/11/21 280 lb 6.4 oz (127.2 kg)       Constitutional: Oriented. No distress.   Head:  Normocephalic and atraumatic.   Mouth/Throat: Oropharynx is clear and moist.   Eyes: Conjunctivae clear without jaunduice. PERRL.  Neck: Neck supple. No rigidity. No JVD present.    Cardiovascular: Normal rate, regular rhythm, S1&S2.    Pulmonary/Chest: Bilateral respiratory sounds. No wheezes, No  rhonchi.    Abdominal: Soft. Bowel sounds present. No distension, No tenderness.   Musculoskeletal: No tenderness. No edema    Lymphadenopathy: Has no cervical adenopathy.   Neurological: Alert and oriented. Cranial nerve appears intact, No Gross deficit   Skin: Skin is warm and dry. No rash noted.   Psychiatric: Has a normal mood, affect and behavior     Labs:  Reviewed.   No results for input(s): NA, K, CL, CO2, PHOS, BUN, CREATININE, CA in the last 72 hours.    Invalid input(s):  TSH  No results for input(s): WBC, HGB, HCT, MCV, PLT in the last 72 hours.  Lab Results   Component Value Date/Time    CKTOTAL 106 11/29/2013 10:44 PM    TROPONINI <0.01 02/10/2021 11:57 PM     No results found for: BNP  Lab Results   Component Value Date/Time    PROTIME 15.4 02/15/2021 11:17 AM    PROTIME 15.8 02/10/2021 10:37 AM    PROTIME 11.8 10/25/2016 11:37 AM    INR 1.23 02/15/2021 11:17 AM    INR 1.27 02/10/2021 10:37 AM    INR 1.04 10/25/2016 11:37 AM     Lab Results   Component Value Date/Time    CHOL 249 03/04/2021 12:44 PM    HDL 44 03/04/2021 12:44 PM    TRIG 146 03/04/2021 12:44 PM       ECG: Personally reviewed: NSR, HR 79, PR 162, QRS 108, QTc 467    ECHO: 02/05/2021  Summary   Left ventricular cavity size is severely dilated.   Normal left ventricular wall thickness.   Ejection fraction is visually estimated to be <20%.   There is severe diffuse hypokinesis.   Global Longitiudinal Strain= -5.0%   Grade III diastolic dysfunction with elevated LV filling pressures.   Moderate thickening of leaflets of mitral valve.   moderate mitral regurgitation.   Mildly dilated right ventricle.   Right ventricular systolic function is severely  reduced.   The tricuspid valve was not well visualized.   moderate tricuspid regurgitation.   The right atrium is moderately dilated.   IVC size is dilated (>2.1 cm) and collapses < 50% with respiration   consistent with elevated RA pressure (15 mmHg).   Estimated pulmonary artery systolic pressure is mildly elevated at 40 mmHg   assuming a right atrial pressure of 15 mmHg.    Stress Test: N/A    Cardiac Angiography: N/A    Problem List:   Patient Active Problem List    Diagnosis Date Noted    Systemic lupus erythematosus (HCC) 09/22/2014    Chronic systolic CHF (congestive heart failure) (HCC) 04/07/2021    Atrial fibrillation and flutter (HCC)     Paroxysmal atrial fibrillation (HCC)     Atrial flutter with rapid ventricular response (HCC)     Dilated cardiomyopathy (HCC)     Acute respiratory failure with hypoxia (HCC)     Lactic acidosis     CHF (congestive heart failure), NYHA class I, acute on chronic, combined (HCC) 02/09/2021    Acute on chronic congestive heart failure (HCC) 02/01/2021    Shortness of breath 01/29/2021    Acute bacterial sinusitis 12/30/2020    Seasonal allergies 05/08/2019    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 Paroxysmal atrial flutter (HCC)    2. Paroxysmal atrial fibrillation (HCC)    3. On continuous oral anticoagulation    4. NICM (nonischemic cardiomyopathy) (HCC)    5. Chronic systolic CHF (congestive heart failure) (HCC)    6. Benign essential HTN       Cardiac HX: Molly [name] is a 60 y.o. woman with a h/o morbid obesity, NHL of the L groin (2006), s/p chemo (2006-2007), SLE, RA, who p/w ongoing SOB, cough, BLE edema for approx prior 30 days, was hypotensive, BNP 4700, found to have an EF<20%  per echo, grade III DD, found to be in AFL with a HR of 127 bpm, CT neg for PE, poss elevated R sided pressures, developed hypoxia on 02/10/2021 that resolved with NRB, s/p RFA of AFL (02/12/2021), became hypoxic post procedure and was unable to be intubated, developed AF, placed on an amio gtt, and pressors, extubated 6/27, changed to po amio, found to be COVID +.    CHA2DS2-VASc 3. TSH 1.6 (02/10/2021).      pAF/AFL  - In NSR - no break through per patient  - S/p RFA of AFL w/ pAF noted  - On Eliquis 5 mg - no s/s bleeding - continue  - Reviewed recent labs  - Consider sleep study if recurrent AF/AFL  - Follow-up with Dr. Larwance Sachs in 8 months     CMP  - EF <20%, repeat 40-45%  - NYHA class II  - QRS 104  - On Toprol XL 25 mg QD - will increase to 50 mg QD  - Did not tolerate losartan  - Following with Dr. Harrison Mons - has appmt 01/04/2022  - Cardiac rehab  - ECG ordered and results personally reviewed     HTN  - BP on the high side in the office  - Will go up on the Toprol XL to 50 mg QD  - Patient to monitor BP at  home and send in in a couple of weeks    EF of 40-45%  ARB/BB for systolic HF  ASA and Statin for CAD  Anticoagulation for AF and heart failure  No tobacco use.      All questions and concerns were addressed to the patient/family. Alternatives to my treatment were discussed. The note was completed using EMR. Every effort was made to ensure accuracy; however, inadvertent computerized transcription errors may be present.     Patient received education regarding their diagnosis, treatment and medications while in the office today.    Ferdinand Lango CNP  [name]    I  have spent  35 minutes in care of the patient including direct face to face time, chart preparation, reviewing diagnostic testing, other provider notes and coordinating patient care.

## 2021-08-10 NOTE — Patient Instructions (Signed)
20 Tips For Changing the Way You Eat    If you think you are hungry, drink a glass of water or a cup of coffee before eating.    Eat s-[id number]-y! It takes your brain 20 minutes to catch up to your stomach and by then most of us have overeaten.    Eat for 10 minutes and rest for 5 minutes. If you are still hungry, start over.    Eat to live not live to eat! You control food, it does not control you!    Your stomach is the size of your fist. If you eat more than that, you have overeaten.    Eat when you are hungry and not because it is a scheduled time to eat.    If you are hungry and it is not time yet to eat your meal, eat a small snack (such as 2 peanut butter crackers, 1 tsp of peanut butter, 15 peanuts, small apple or a cup of light popcorn). Make sure you take 10 minutes to eat this snack so it will hold you over to your next meal.    Eat your fruit first as it will help breakdown you meal.    If given a lot of options at a meal - pick only 3. People who put more than one item on their plate tend to eat more.    Eat the one item you want the most - first! This will help fill you up more and control your hunger.    Get moving! Get a pedometer and try to get in as many steps as you can. 2000 steps = 1 mile. Make 10,000 steps a day your goal.    [place] are the only [place] that practices eating to prevent getting hungry.    Identify if you are an emotional eater - you want something salty or sweet instead of something healthy or you want to eat when you are not hungry.    Sugar contributes nothing in the way of nutrients.    Measure your level of hunger. The hungrier you are, the more fat you burn when you eat.     If you eat dessert more than 30 minutes after a meal, it turns to fat.    The calories in alcohol are used before stored fat calories.    It is ok to miss a meal if you are not hungry.    If snacks are not around the house, then they won???t tempt you. Make better choices at the store for those times when you  are really hungry.    Remember that food will only make you feel better when you are really hungry not when you are sad, mad, stressed or depressed.

## 2021-09-25 ENCOUNTER — Encounter

## 2021-09-27 MED ORDER — FUROSEMIDE 40 MG PO TABS
40 MG | ORAL_TABLET | ORAL | 3 refills | Status: DC
Start: 2021-09-27 — End: 2022-03-02

## 2021-09-27 NOTE — Telephone Encounter (Signed)
LOV: 04/05/2021  FOV: N/A

## 2021-11-11 MED ORDER — APIXABAN 5 MG PO TABS
5 MG | ORAL_TABLET | Freq: Two times a day (BID) | ORAL | 3 refills | Status: AC
Start: 2021-11-11 — End: ?

## 2021-11-11 NOTE — Telephone Encounter (Signed)
Requested Prescriptions     Pending Prescriptions Disp Refills    apixaban (ELIQUIS) 5 MG TABS tablet 180 tablet 3     Sig: Take 1 tablet by mouth 2 times daily            Last Office Visit: 08/10/2021     Next Office Visit: 01/04/2022         Last Labs: 07.28.2022

## 2022-01-04 ENCOUNTER — Encounter: Payer: MEDICAID | Attending: Cardiovascular Disease | Primary: Gerontology

## 2022-01-04 ENCOUNTER — Ambulatory Visit: Admit: 2022-01-04 | Discharge: 2022-01-04 | Payer: MEDICAID | Attending: Cardiovascular Disease | Primary: Gerontology

## 2022-01-04 DIAGNOSIS — I5022 Chronic systolic (congestive) heart failure: Secondary | ICD-10-CM

## 2022-01-04 NOTE — Progress Notes (Signed)
Cc: HFrEF, PAFL with RVR    HPI:     Molly [name] is a 61 y.o. female with a past medical history of morbid obesity (BMI 43), non-Hodgkin's lymphoma status post chemotherapy 2006- 2007, SLE, RA, HFrEF, paroxysmal atrial flutter with RVR s/p ablation 02/12/21 by Dr. Clarise Cruz.      Patient reports she had MUGA test prior to chemotherapy and during treatment, last MUGA test was 2 years after her last chemotherapy and cardiac function was reportedly normal.     Was admitted at the [name] 6/21 - 02/24/2021 for cardiogenic shock, atrial flutter with RVR s/p ablation on 6/24.     ECG 02/09/2021: Atrial flutter/coarse A. fib with RVR 138 bpm.     Echo 02/05/2021: LVD 6.8 cm, LVEF less than 20%, diastolic grade 3, increased LVEDP, severe RV dilatation and dysfunction, moderate MR/TR, RVSP 40 (15).  Images were personally reviewed.     No ischemic evaluation.     Patient cannot tolerate Entresto and losartan due to significant orthostatic hypotension and/or syncope.    Echo 04/2021: Mild LVH, LVEF 40-45%.     Patient returns to the clinic today for follow-up.  Patient reports mild fatigue with moderate exertion.  She takes half a tablet of Lasix every other day.      Histories     Past Medical History:   has a past medical history of Amenorrhea, Cancer (HCC), Chronic combined systolic and diastolic CHF, NYHA class 2 (HCC), History of non-Hodgkin's lymphoma, and Systemic lupus.    Surgical History:   has a past surgical history that includes LEEP; knee surgery (Left); Knee Arthroplasty (Left, 10/25/2016); and joint replacement.     Social History:   reports that she quit smoking about 15 years ago. Her smoking use included cigarettes. She has a 9.00 pack-year smoking history. She has never used smokeless tobacco. She reports current alcohol use of about 1.0 standard drink per week. She reports that she does not use drugs.     Family History:  No evidence for sudden cardiac death or premature CAD      Medications:      Home medications were reviewed and are listed below    Prior to Admission medications    Medication Sig Start Date End Date Taking? Authorizing Provider   naproxen (NAPROSYN) 500 MG tablet Take 1 tablet by mouth 2 times daily (with meals)   Yes Historical Provider, MD   apixaban (ELIQUIS) 5 MG TABS tablet Take 1 tablet by mouth 2 times daily 11/11/21  Yes Warnell Bureau, APRN - CNP   furosemide (LASIX) 40 MG tablet TAKE 1 TABLET BY MOUTH EVERY DAY 09/27/21  Yes Lindell Spar, APRN - CNP   metoprolol succinate (TOPROL XL) 50 MG extended release tablet Take 1 tablet by mouth daily 08/10/21  Yes Warnell Bureau, APRN - CNP   melatonin 5 MG TBDP disintegrating tablet Take 1 tablet by mouth nightly 02/24/21  Yes Erin Black, DO   albuterol sulfate HFA (VENTOLIN HFA) 108 (90 Base) MCG/ACT inhaler Inhale 2 puffs into the lungs 4 times daily as needed for Wheezing 12/30/20  Yes Lauren Marcelino Scot, APRN - CNP   DULoxetine (CYMBALTA) 60 MG extended release capsule Take 1 capsule by mouth daily 04/18/18 08/10/21  Harley Hallmark, MD          Allergy:     Sulfa antibiotics       Review of Systems:     All 12 point review of symptoms  completed. Pertinent positives identified in the HPI, all other review of symptoms negative as below.    CONSTITUTIONAL: No fatigue  SKIN: No rash or pruritis.  EYES: No visual changes or diplopia. No scleral icterus.  ENT: No Headaches, hearing loss or vertigo. No mouth sores or sore throat.  CARDIOVASCULAR: No chest pain/chest pressure/chest discomfort. No palpitations. No edema.   RESPIRATORY: No dyspnea. No cough or wheezing, no sputum production.  GASTROINTESTINAL: No N/V/D. No abdominal pain, appetite loss, blood in stools.  GENITOURINARY: No dysuria, trouble voiding, or hematuria.  MUSCULOSKELETAL:  No gait disturbance, weakness or joint complaints.  NEUROLOGICAL: No headache, diplopia, change in muscle strength, numbness or tingling. No change in gait, balance, coordination, mood, affect,  memory, mentation, behavior.  PSHYCH: No anxiety, loss of interest, change in sexual behavior, feelings of self-harm, or confusion.  ENDOCRINE: No excessive thirst, fluid intake, or urination. No tremor.  HEMATOLOGIC: No abnormal bruising or bleeding.  ALLERGY: No nasal congestion or hives.      Physical Examination:     Vitals:    01/04/22 1326   BP: 124/66   Pulse: 82   Weight: 290 lb 3.2 oz (131.6 kg)       Wt Readings from Last 3 Encounters:   01/04/22 290 lb 3.2 oz (131.6 kg)   08/10/21 282 lb 12.8 oz (128.3 kg)   07/06/21 277 lb 6.4 oz (125.8 kg)         General Appearance:  Alert, cooperative, no distress, appears stated age Appropriate weight   Head:  Normocephalic, without obvious abnormality, atraumatic   Eyes:  PERRL, conjunctiva/corneas clear EOM intact  Ears normal   Throat no lesions       Nose: Nares normal, no drainage or sinus tenderness   Throat: Lips, mucosa, and tongue normal   Neck: Supple, symmetrical, trachea midline, no adenopathy, thyroid: not enlarged, symmetric, no tenderness/mass/nodules, no carotid bruit       Lungs:   Clear to auscultation bilaterally, respirations unlabored   Chest Wall:  No tenderness or deformity   Heart:  Regular rhythm, rate is controlled, S1, S2 normal, there is no murmur, there is no rub or gallop, cannot assess jvd, no bilateral lower extremity edema   Abdomen:   Soft, non-tender, bowel sounds active all four quadrants,  no masses, no organomegaly       Extremities: Extremities normal, atraumatic, no cyanosis   Pulses: 2+ and symmetric   Skin: Skin color, texture, turgor normal, no rashes or lesions   Pysch: Normal mood and affect   Neurologic: Normal gross motor and sensory exam.  Cranial nerves intact        Labs:     Lab Results   Component Value Date    WBC 6.5 02/24/2021    HGB 14.5 02/24/2021    HCT 43.8 02/24/2021    MCV 88.2 02/24/2021    PLT 423 02/24/2021     Lab Results   Component Value Date    NA 138 03/18/2021    K 3.9 03/18/2021    CL 96 (L)  03/18/2021    CO2 27 03/18/2021    BUN 11 03/18/2021    CREATININE 0.8 03/18/2021    GLUCOSE 94 03/18/2021    CALCIUM 9.4 03/18/2021    PROT 7.5 03/04/2021    LABALBU 4.1 03/04/2021    BILITOT 0.8 03/04/2021    ALKPHOS 132 (H) 03/04/2021    AST 33 03/04/2021    ALT 28 03/04/2021  LABGLOM >60 03/18/2021    GFRAA >60 03/18/2021    AGRATIO 1.2 03/04/2021    GLOB 3.3 09/29/2015         Lab Results   Component Value Date    CHOL 249 (H) 03/04/2021    CHOL 121 02/10/2021    CHOL 245 (H) 04/24/2014     Lab Results   Component Value Date    TRIG 146 03/04/2021    TRIG 82 02/10/2021    TRIG 149 04/24/2014     Lab Results   Component Value Date    HDL 44 03/04/2021    HDL 33 (L) 02/10/2021    HDL 55 07/01/2019     Lab Results   Component Value Date    LDLCALC 176 (H) 03/04/2021    LDLCALC 72 02/10/2021    LDLCALC 149 (H) 07/01/2019     Lab Results   Component Value Date    LABVLDL 29 03/04/2021    LABVLDL 16 02/10/2021    LABVLDL 34 07/01/2019     No results found for: [facility]    Lab Results   Component Value Date    INR 1.23 (H) 02/15/2021    INR 1.27 (H) 02/10/2021    INR 1.04 10/25/2016    PROTIME 15.4 (H) 02/15/2021    PROTIME 15.8 (H) 02/10/2021    PROTIME 11.8 10/25/2016       The 10-year ASCVD risk score (Arnett DK, et al., 2019) is: 3.9%    Values used to calculate the score:      Age: 14 years      Sex: Female      Is Non-Hispanic African American: No      Diabetic: No      Tobacco smoker: No      Systolic Blood Pressure: 124 mmHg      Is BP treated: No      HDL Cholesterol: 44 mg/dL      Total Cholesterol: 249 mg/dL      Assessment / Plan:      Diagnosis Orders   1. Chronic systolic CHF (congestive heart failure) (HCC)  Echo limited           1.  New HFrEF:  It could be ischemic (severe CAD) versus nonischemic (PAFL RVR, less likely postchemotherapy, viral).  Patient had atrial flutter ablation during her hospitalization in 01/2021.  She now remains in sinus rhythm.  She reports minimal symptoms with  exertion. Her NYHA FC is II, stage C. TSH elevated but T4 normal.  LVEF is now slowly improving.     - Continue with Toprol 25 mg daily  - Use lasix only prn edema or excess salt in diet.   - Unable to start ACEI, ARB, spironolactone, Entresto or SGLT2 due to hypotension.   -No need for LifeVest or ICD with LVEF > 35% and improving.   - Will repeat limited echo in 6 months     2.  Paroxysmal atrial flutter with RVR s/p ablation:  Patient follows with EP.    - Continue with Eliquis 5 mg twice daily  - Continue with Toprol 25 daily.         Return in about 6 months (around 07/07/2022).    I have spent 35 minutes of reviewing records and face to face time with the patient with more than 50% spent counseling and coordinating care.       I have personally reviewed the reports and images of labs, radiological studies, cardiac studies  including ECG's and telemetry, current and old medical records. The note was completed using EMR and Dragon dictation system. Every effort was made to ensure accuracy; however, inadvertent computerized transcription errors may be present.    All questions and concerns were addressed to the patient/family. Alternatives to my treatment were discussed.     I would like to thank you for providing me the opportunity to participate in the care of your patient. If you have any questions, please do not hesitate to contact me.     Hervey Ard, MD, [facility], [place]  [address]  [place] [address]  Main Office Phone: [phone]  Fax: [phone]

## 2022-02-15 NOTE — Telephone Encounter (Signed)
Spoke with pt and advised pt to get checked out by her PCP. Pt verbalized understanding and was going to call her PCP to schedule an appt.

## 2022-02-16 ENCOUNTER — Ambulatory Visit: Admit: 2022-02-16 | Discharge: 2022-02-16 | Payer: MEDICAID | Attending: Gerontology | Primary: Gerontology

## 2022-02-16 DIAGNOSIS — R0602 Shortness of breath: Secondary | ICD-10-CM

## 2022-02-16 MED ORDER — AZITHROMYCIN 250 MG PO TABS
250 MG | ORAL_TABLET | ORAL | 0 refills | Status: AC
Start: 2022-02-16 — End: 2022-02-21

## 2022-02-16 NOTE — Assessment & Plan Note (Signed)
Will start patient on zpack. Continue with symptomatic management, increased water intake, saline irrigation, as well as mucolytics and antihistamines as needed. Patient advised to call back if no improvement.

## 2022-02-16 NOTE — Progress Notes (Signed)
Molly [name] (DOB:  Sep 12, 1960) is a 61 y.o. female,Established patient, here for evaluation of the following chief complaint(s):  Shortness of Breath (Has been present for ~1 week)      ASSESSMENT/PLAN:  1. SOB (shortness of breath)  Assessment & Plan:   ekg stable  Exam reassuring     Orders:  -     EKG 12 Lead  2. Sore throat  -     POCT rapid strep A  3. Acute bacterial sinusitis  Assessment & Plan:   Will start patient on zpack. Continue with symptomatic management, increased water intake, saline irrigation, as well as mucolytics and antihistamines as needed. Patient advised to call back if no improvement.         No follow-ups on file.    SUBJECTIVE/OBJECTIVE:  In the office with shortness of breath, cough, and some wheezing. She has had fevers, up to 101.2, then broke this am. She did sleep last night, and took some tylenol, which helped. She is having sinus pain and pressure. She also has some throat pain.  She has not noticed any significant weight gain or leg swelling.  She has been taking all of her medications for her heart failure as prescribed.    Current Outpatient Medications   Medication Sig Dispense Refill    hydroxychloroquine (PLAQUENIL) 25 MG SUSP Take 1 mL by mouth nightly      nabumetone (RELAFEN) 500 MG tablet Take 1 tablet by mouth daily      azithromycin (ZITHROMAX) 250 MG tablet Take 1 tablet by mouth See Admin Instructions for 5 days 500mg  on day 1 followed by 250mg  on days 2 - 5 6 tablet 0    naproxen (NAPROSYN) 500 MG tablet Take 1 tablet by mouth 2 times daily (with meals)      apixaban (ELIQUIS) 5 MG TABS tablet Take 1 tablet by mouth 2 times daily 180 tablet 3    furosemide (LASIX) 40 MG tablet TAKE 1 TABLET BY MOUTH EVERY DAY 60 tablet 3    metoprolol succinate (TOPROL XL) 50 MG extended release tablet Take 1 tablet by mouth daily 90 tablet 3    melatonin 5 MG TBDP disintegrating tablet Take 1 tablet by mouth nightly      albuterol sulfate HFA (VENTOLIN HFA) 108 (90 Base) MCG/ACT  inhaler Inhale 2 puffs into the lungs 4 times daily as needed for Wheezing 54 g 1    DULoxetine (CYMBALTA) 60 MG extended release capsule Take 1 capsule by mouth daily 30 capsule 5     No current facility-administered medications for this visit.       Review of Systems   Constitutional:  Negative for chills, fatigue and fever.   HENT:  Positive for congestion, postnasal drip, sinus pressure, sinus pain and sore throat. Negative for ear pain, rhinorrhea and sneezing.    Eyes:  Negative for redness and itching.   Respiratory:  Positive for cough and shortness of breath. Negative for chest tightness and wheezing.    Cardiovascular:  Negative for chest pain and palpitations.   Gastrointestinal:  Negative for abdominal pain, blood in stool, constipation, diarrhea, nausea and vomiting.   Endocrine: Negative for cold intolerance and heat intolerance.   Genitourinary:  Negative for difficulty urinating, dysuria, flank pain, frequency, hematuria and urgency.   Musculoskeletal:  Negative for arthralgias, back pain, joint swelling and myalgias.   Skin:  Negative for color change, pallor, rash and wound.   Allergic/Immunologic: Negative for environmental allergies and  food allergies.   Neurological:  Negative for dizziness, seizures, syncope, weakness, light-headedness, numbness and headaches.   Hematological:  Negative for adenopathy. Does not bruise/bleed easily.   Psychiatric/Behavioral:  Negative for confusion, sleep disturbance and suicidal ideas. The patient is not nervous/anxious and is not hyperactive.      Vitals:    02/16/22 0958   BP: 120/72   Site: Left Upper Arm   Position: Sitting   Cuff Size: Large Adult   Pulse: 96   Temp: 98.6 F (37 C)   SpO2: 96%   Weight: 282 lb (127.9 kg)   Height: 5' 10.5" (1.791 m)       Physical Exam  Constitutional:       Appearance: Normal appearance. She is well-developed.   HENT:      Head: Normocephalic and atraumatic.      Right Ear: Hearing normal.      Left Ear: Hearing normal.       Nose: No mucosal edema.      Right Sinus: Frontal sinus tenderness present. No maxillary sinus tenderness.      Left Sinus: Frontal sinus tenderness present. No maxillary sinus tenderness.      Mouth/Throat:      Tonsils: No tonsillar abscesses.   Eyes:      Extraocular Movements: Extraocular movements intact.      Pupils: Pupils are equal, round, and reactive to light.   Cardiovascular:      Rate and Rhythm: Normal rate and regular rhythm.      Pulses: Normal pulses.      Heart sounds: Normal heart sounds.   Pulmonary:      Effort: Pulmonary effort is normal.      Breath sounds: Normal breath sounds.   Lymphadenopathy:      Head:      Right side of head: No submental, submandibular, tonsillar, preauricular, posterior auricular or occipital adenopathy.      Left side of head: No submental, submandibular, tonsillar, preauricular, posterior auricular or occipital adenopathy.   Skin:     General: Skin is warm and dry.   Neurological:      Mental Status: She is alert.   Psychiatric:         Mood and Affect: Mood normal.         Behavior: Behavior normal.               An electronic signature was used to authenticate this note.    --Lindell Spar, APRN - CNP

## 2022-02-16 NOTE — Assessment & Plan Note (Signed)
ekg stable  Exam reassuring

## 2022-03-02 ENCOUNTER — Ambulatory Visit: Admit: 2022-03-02 | Discharge: 2022-03-02 | Payer: MEDICAID | Attending: Gerontology | Primary: Gerontology

## 2022-03-02 ENCOUNTER — Inpatient Hospital Stay: Admit: 2022-03-02 | Payer: MEDICAID | Primary: Gerontology

## 2022-03-02 DIAGNOSIS — R0602 Shortness of breath: Secondary | ICD-10-CM

## 2022-03-02 DIAGNOSIS — I4891 Unspecified atrial fibrillation: Secondary | ICD-10-CM

## 2022-03-02 MED ORDER — METOPROLOL SUCCINATE ER 100 MG PO TB24
100 MG | ORAL_TABLET | Freq: Every day | ORAL | 0 refills | Status: AC
Start: 2022-03-02 — End: ?

## 2022-03-02 MED ORDER — FUROSEMIDE 40 MG PO TABS
40 MG | ORAL_TABLET | Freq: Every day | ORAL | 3 refills | Status: AC
Start: 2022-03-02 — End: 2022-09-08

## 2022-03-02 MED ORDER — FUROSEMIDE 40 MG PO TABS
40 MG | ORAL_TABLET | Freq: Every day | ORAL | 3 refills | Status: DC
Start: 2022-03-02 — End: 2022-03-02

## 2022-03-02 NOTE — Assessment & Plan Note (Signed)
Patient in fluid overload at this time.  We will restart her Lasix and send her for chest x-ray.  I have asked her to go to the emergency room however patient refuses.  Educated patient on the risks related to this decision and she verbalizes understanding.  I have asked her to instead follow-up with me in the office tomorrow if she is not willing to go to the emergency room which she is willing to do.  Patient was placed on 2 L of oxygen in the office and oxygen saturation did come up to 94%.I did advise her that if symptoms progress over the evening she should report to the emergency room which she verbalized understanding

## 2022-03-02 NOTE — Progress Notes (Signed)
Molly [name] (DOB:  04-Feb-1961) is a 61 y.o. female,Established patient, here for evaluation of the following chief complaint(s):  Tachycardia      ASSESSMENT/PLAN:  1. Atrial fibrillation and flutter (HCC)  Assessment & Plan:   EKG shows sinus tachycardia P waves noted in regular rhythm.  She is not currently in a rapid A-fib at this time.  Orders:  -     EKG 12 Lead - Clinic Performed  2. NICM (nonischemic cardiomyopathy) (HCC)  -     metoprolol succinate (TOPROL XL) 100 MG extended release tablet; Take 1 tablet by mouth daily, Disp-180 tablet, R-0Normal  -     furosemide (LASIX) 40 MG tablet; Take 1 tablet by mouth daily, Disp-60 tablet, R-3Normal  3. Paroxysmal atrial flutter (HCC)  -     metoprolol succinate (TOPROL XL) 100 MG extended release tablet; Take 1 tablet by mouth daily, Disp-180 tablet, R-0Normal  4. SOB (shortness of breath)  -     XR CHEST STANDARD (2 VW); Future  5. Acute on chronic systolic congestive heart failure [facility])  Assessment & Plan:   Patient in fluid overload at this time.  We will restart her Lasix and send her for chest x-ray.  I have asked her to go to the emergency room however patient refuses.  Educated patient on the risks related to this decision and she verbalizes understanding.  I have asked her to instead follow-up with me in the office tomorrow if she is not willing to go to the emergency room which she is willing to do.  Patient was placed on 2 L of oxygen in the office and oxygen saturation did come up to 94%.I did advise her that if symptoms progress over the evening she should report to the emergency room which she verbalized understanding      No follow-ups on file.    SUBJECTIVE/OBJECTIVE:  Patient the office today for reports of tachycardia and shortness of breath.  Upon assessment and remain by the medical assistant she was tachycardic in the 120s and hypoxic with an oxygen level around 83% on room air.  She denies any chest pain but reports that the shortness of  breath and tachycardia has her worried about when she was originally diagnosed with heart failure and A-fib.  She states that her weight has been stable but she has not been taking her Lasix.  She denies any wheezing she is remain compliant with her Eliquis.    Current Outpatient Medications   Medication Sig Dispense Refill    metoprolol succinate (TOPROL XL) 100 MG extended release tablet Take 1 tablet by mouth daily 180 tablet 0    furosemide (LASIX) 40 MG tablet Take 1 tablet by mouth daily 60 tablet 3    nabumetone (RELAFEN) 500 MG tablet Take 1 tablet by mouth daily      naproxen (NAPROSYN) 500 MG tablet Take 1 tablet by mouth 2 times daily (with meals)      apixaban (ELIQUIS) 5 MG TABS tablet Take 1 tablet by mouth 2 times daily 180 tablet 3    melatonin 5 MG TBDP disintegrating tablet Take 1 tablet by mouth nightly      albuterol sulfate HFA (VENTOLIN HFA) 108 (90 Base) MCG/ACT inhaler Inhale 2 puffs into the lungs 4 times daily as needed for Wheezing 54 g 1    DULoxetine (CYMBALTA) 60 MG extended release capsule Take 1 capsule by mouth daily 30 capsule 5    hydroxychloroquine (PLAQUENIL) 25 MG SUSP  Take 1 mL by mouth nightly (Patient not taking: Reported on 03/02/2022)       No current facility-administered medications for this visit.       Review of Systems   Constitutional:  Negative for chills, fatigue and fever.   HENT:  Negative for congestion, ear pain, postnasal drip, rhinorrhea, sinus pressure, sneezing and sore throat.    Eyes:  Negative for redness and itching.   Respiratory:  Positive for shortness of breath. Negative for cough, chest tightness and wheezing.    Cardiovascular:  Negative for chest pain and palpitations.   Gastrointestinal:  Negative for abdominal pain, blood in stool, constipation, diarrhea, nausea and vomiting.   Endocrine: Negative for cold intolerance and heat intolerance.   Genitourinary:  Negative for difficulty urinating, dysuria, flank pain, frequency, hematuria and urgency.    Musculoskeletal:  Negative for arthralgias, back pain, joint swelling and myalgias.   Skin:  Negative for color change, pallor, rash and wound.   Allergic/Immunologic: Negative for environmental allergies and food allergies.   Neurological:  Negative for dizziness, seizures, syncope, weakness, light-headedness, numbness and headaches.   Hematological:  Negative for adenopathy. Does not bruise/bleed easily.   Psychiatric/Behavioral:  Negative for confusion, sleep disturbance and suicidal ideas. The patient is not nervous/anxious and is not hyperactive.      Vitals:    03/02/22 1301   BP: 118/72   Pulse: (!) 113   SpO2: (!) 85%   Weight: 275 lb (124.7 kg)   Height: 5' 10.5" (1.791 m)       Physical Exam  Constitutional:       Appearance: Normal appearance. She is well-developed.   HENT:      Head: Normocephalic and atraumatic.      Right Ear: Hearing normal.      Left Ear: Hearing normal.      Nose: No mucosal edema.      Right Sinus: No maxillary sinus tenderness or frontal sinus tenderness.      Left Sinus: No maxillary sinus tenderness or frontal sinus tenderness.      Mouth/Throat:      Tonsils: No tonsillar abscesses.   Eyes:      Extraocular Movements: Extraocular movements intact.      Pupils: Pupils are equal, round, and reactive to light.   Cardiovascular:      Rate and Rhythm: Regular rhythm. Tachycardia present.      Pulses: Normal pulses.      Heart sounds: Normal heart sounds.   Pulmonary:      Effort: Pulmonary effort is normal. Tachypnea present.      Breath sounds: Decreased breath sounds present.   Lymphadenopathy:      Head:      Right side of head: No submental, submandibular, tonsillar, preauricular, posterior auricular or occipital adenopathy.      Left side of head: No submental, submandibular, tonsillar, preauricular, posterior auricular or occipital adenopathy.   Skin:     General: Skin is warm and dry.   Neurological:      Mental Status: She is alert.   Psychiatric:         Mood and Affect:  Mood normal.         Behavior: Behavior normal.               An electronic signature was used to authenticate this note.    --Lindell Spar, APRN - CNP

## 2022-03-02 NOTE — Assessment & Plan Note (Signed)
EKG shows sinus tachycardia P waves noted in regular rhythm.  She is not currently in a rapid A-fib at this time.

## 2022-03-03 ENCOUNTER — Ambulatory Visit: Admit: 2022-03-03 | Discharge: 2022-03-03 | Payer: MEDICAID | Attending: Gerontology | Primary: Gerontology

## 2022-03-03 DIAGNOSIS — I5023 Acute on chronic systolic (congestive) heart failure: Secondary | ICD-10-CM

## 2022-03-03 NOTE — Assessment & Plan Note (Signed)
Significant symptom improvement.  Advised patient continue Lasix daily.  She verbalized understanding that this is a medication she needs to take routinely to avoid exacerbation.  Educated on monitoring weights and to return to office if symptoms not continue to improve or worsen.

## 2022-03-03 NOTE — Progress Notes (Signed)
Lambert Keto (DOB:  [birth year]) is a 61 y.o. female,Established patient, here for evaluation of the following chief complaint(s):  Follow-up      ASSESSMENT/PLAN:  1. Acute on chronic systolic congestive heart failure (HCC)  Assessment & Plan:   Significant symptom improvement.  Advised patient continue Lasix daily.  She verbalized understanding that this is a medication she needs to take routinely to avoid exacerbation.  Educated on monitoring weights and to return to office if symptoms not continue to improve or worsen.      No follow-ups on file.    SUBJECTIVE/OBJECTIVE:  Patient the office today for 24 evaluation of fluid overload, hypoxia, tachycardia.  Yesterday she was suggested to seek emergency care however patient was unable to agree to this given responsibilities at home.  Advised patient on the risks related to this decision which she verbalized understanding and instead opted to go home restart medications and monitor symptoms.  She was restarted on her Lasix and sent home with oxygen.  Today her tachycardia significantly improved, her weight is down and her tachycardia has resolved.  She states she is feeling significantly improved.    Current Outpatient Medications   Medication Sig Dispense Refill    metoprolol succinate (TOPROL XL) 100 MG extended release tablet Take 1 tablet by mouth daily 180 tablet 0    furosemide (LASIX) 40 MG tablet Take 1 tablet by mouth daily 60 tablet 3    nabumetone (RELAFEN) 500 MG tablet Take 1 tablet by mouth daily      naproxen (NAPROSYN) 500 MG tablet Take 1 tablet by mouth 2 times daily (with meals)      apixaban (ELIQUIS) 5 MG TABS tablet Take 1 tablet by mouth 2 times daily 180 tablet 3    melatonin 5 MG TBDP disintegrating tablet Take 1 tablet by mouth nightly      albuterol sulfate HFA (VENTOLIN HFA) 108 (90 Base) MCG/ACT inhaler Inhale 2 puffs into the lungs 4 times daily as needed for Wheezing 54 g 1    hydroxychloroquine (PLAQUENIL) 25 MG SUSP Take 1 mL by  mouth nightly (Patient not taking: Reported on 03/03/2022)      DULoxetine (CYMBALTA) 60 MG extended release capsule Take 1 capsule by mouth daily 30 capsule 5     No current facility-administered medications for this visit.       Review of Systems   Constitutional:  Negative for chills, fatigue and fever.   HENT:  Negative for congestion, ear pain, postnasal drip, rhinorrhea, sinus pressure, sneezing and sore throat.    Eyes:  Negative for redness and itching.   Respiratory:  Negative for cough, chest tightness, shortness of breath and wheezing.    Cardiovascular:  Negative for chest pain and palpitations.   Gastrointestinal:  Negative for abdominal pain, blood in stool, constipation, diarrhea, nausea and vomiting.   Endocrine: Negative for cold intolerance and heat intolerance.   Genitourinary:  Negative for difficulty urinating, dysuria, flank pain, frequency, hematuria and urgency.   Musculoskeletal:  Negative for arthralgias, back pain, joint swelling and myalgias.   Skin:  Negative for color change, pallor, rash and wound.   Allergic/Immunologic: Negative for environmental allergies and food allergies.   Neurological:  Negative for dizziness, seizures, syncope, weakness, light-headedness, numbness and headaches.   Hematological:  Negative for adenopathy. Does not bruise/bleed easily.   Psychiatric/Behavioral:  Negative for confusion, sleep disturbance and suicidal ideas. The patient is not nervous/anxious and is not hyperactive.      Vitals:  03/03/22 0917   BP: 112/70   Site: Left Upper Arm   Position: Sitting   Cuff Size: Large Adult   Pulse: 86   SpO2: 96%   Weight: 274 lb 6.4 oz (124.5 kg)   Height: 5' 10.5" (1.791 m)       Physical Exam  Constitutional:       Appearance: Normal appearance. She is well-developed.   HENT:      Head: Normocephalic and atraumatic.      Right Ear: Hearing normal.      Left Ear: Hearing normal.      Nose: No mucosal edema.      Right Sinus: No maxillary sinus tenderness or  frontal sinus tenderness.      Left Sinus: No maxillary sinus tenderness or frontal sinus tenderness.      Mouth/Throat:      Tonsils: No tonsillar abscesses.   Eyes:      Extraocular Movements: Extraocular movements intact.      Pupils: Pupils are equal, round, and reactive to light.   Cardiovascular:      Rate and Rhythm: Normal rate and regular rhythm.      Pulses: Normal pulses.      Heart sounds: Normal heart sounds.   Pulmonary:      Effort: Pulmonary effort is normal.      Breath sounds: Normal breath sounds.   Lymphadenopathy:      Head:      Right side of head: No submental, submandibular, tonsillar, preauricular, posterior auricular or occipital adenopathy.      Left side of head: No submental, submandibular, tonsillar, preauricular, posterior auricular or occipital adenopathy.   Skin:     General: Skin is warm and dry.   Neurological:      Mental Status: She is alert.   Psychiatric:         Mood and Affect: Mood normal.         Behavior: Behavior normal.               An electronic signature was used to authenticate this note.    --Lindell Spar, APRN - CNP

## 2022-04-12 ENCOUNTER — Encounter: Payer: MEDICAID | Primary: Gerontology

## 2022-04-12 ENCOUNTER — Ambulatory Visit: Admit: 2022-04-12 | Discharge: 2022-04-12 | Payer: MEDICAID | Attending: Internal Medicine | Primary: Gerontology

## 2022-04-12 DIAGNOSIS — I480 Paroxysmal atrial fibrillation: Secondary | ICD-10-CM

## 2022-04-12 NOTE — Progress Notes (Unsigned)
[facility]   Cardiac Electrophysiology Consultation   Date: 03/23/2022  Reason for Consultation:   Consult Requesting Physician: No att. providers found     Chief Complaint:   No chief complaint on file.     HPI: Molly [name] is a 61 y.o. female with PMH significant for morbid obesity, NHL of the L groin, s/p chemo (2006-2007), SLE, RA, who p/w ongoing SOB, cough, BLE edema for 30 days, hypotensive, EF<20% per echo (01/2021), grade III DD, found to be in AFL RVR, s/p RFA of AFL (02/12/21), became hypoxic post procedure and was unable to be extubated, developed AF, placed on amiodarone, COVID positive. Monitor (02/2021) showed brief PAT, but no AF/AFL. Repeat echo (04/23/21) showed EF improved to 40-45%, amiodarone and digoxin stopped (04/2021),    Interval History:   Today, she is here for 8 mo f/u, presenting in SR.     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 Harrison Mons    Plan:      Atrial Fibrillation:    BMI    :   There is no height or weight on file to calculate BMI.    Duration   :   01/2021    Symptoms   :   Shortness of breath, palpitations, easy fatigability, dyspnea on exertion, decline in his functional capacity    Previous DCCV :  none    Previous AAD             :   amiodarone 01/2021- 04/2021    Beta blocker  :   Toprol    ACE / ARB  :   none    OAC   :   Eliquis    LVEF    :   < 20% >> 40-45%    Left atrial size  :   4.3 cm    OSA   :   not tested    Past Medical History:   Diagnosis Date    Amenorrhea     Cancer (HCC)     Chronic combined systolic and diastolic CHF, NYHA class 2 (HCC)     History of non-Hodgkin's lymphoma 2006    treated with radiation and chemo    Systemic lupus     followed by Dr. Harley Hallmark        Past Surgical History:   Procedure Laterality Date    JOINT  REPLACEMENT      KNEE ARTHROPLASTY Left 10/25/2016    left total knee replacement using computer navigation [name] Robot)    KNEE SURGERY Left     ACL and meniscus repair    LEEP      in 1990's       Allergies:  Allergies   Allergen Reactions    Sulfa Antibiotics Rash       Medication:   Prior to Admission medications    Medication Sig Start Date End Date Taking? Authorizing Provider   metoprolol succinate (TOPROL XL) 100 MG extended release tablet Take 1 tablet by mouth daily 03/02/22   Lindell Spar, APRN - CNP   furosemide (LASIX) 40 MG tablet Take 1 tablet by mouth daily 03/02/22   Lindell Spar, APRN - CNP   hydroxychloroquine (PLAQUENIL) 25 MG  SUSP Take 1 mL by mouth nightly  Patient not taking: Reported on 03/03/2022 02/15/22   Historical Provider, MD   nabumetone (RELAFEN) 500 MG tablet Take 1 tablet by mouth daily 02/12/22   Historical Provider, MD   naproxen (NAPROSYN) 500 MG tablet Take 1 tablet by mouth 2 times daily (with meals)    Historical Provider, MD   apixaban (ELIQUIS) 5 MG TABS tablet Take 1 tablet by mouth 2 times daily 11/11/21   Warnell Bureau, APRN - CNP   melatonin 5 MG TBDP disintegrating tablet Take 1 tablet by mouth nightly 02/24/21   Erin Black, DO   albuterol sulfate HFA (VENTOLIN HFA) 108 (90 Base) MCG/ACT inhaler Inhale 2 puffs into the lungs 4 times daily as needed for Wheezing 12/30/20   Lindell Spar, APRN - CNP   DULoxetine (CYMBALTA) 60 MG extended release capsule Take 1 capsule by mouth daily 04/18/18 03/02/22  Harley Hallmark, MD       Social History:   reports that she quit smoking about 17 years ago. Her smoking use included cigarettes. She started smoking about 36 years ago. She has a 9.00 pack-year smoking history. She has never used smokeless tobacco. She reports that she does not currently use alcohol after a past usage of about 5.0 standard drinks per week. She reports that she does not use drugs.        Family History:  family history includes Arrhythmia in her  brother; Cancer in her sister; Dementia in her mother; Heart Attack in her paternal aunt; High Cholesterol in her brother, brother, and sister; Other in her father; Thyroid Disease in her mother and sister.   Reviewed. Denies family history of sudden cardiac death, arrhythmia, premature CAD    Review of System:    General ROS: negative for - chills, fever   Psychological ROS: negative for - anxiety or depression  Ophthalmic ROS: negative for - eye pain or loss of vision  ENT ROS: negative for - epistaxis, headaches, nasal discharge, sore throat   Allergy and Immunology ROS: negative for - hives, nasal congestion   Hematological and Lymphatic ROS: negative for - bleeding problems, blood clots, bruising or jaundice  Endocrine ROS: negative for - skin changes, temperature intolerance or unexpected weight changes  Respiratory ROS: negative for - cough, hemoptysis, pleuritic pain, SOB, sputum changes or wheezing  Cardiovascular ROS: Per HPI.   Gastrointestinal ROS: negative for - abdominal pain, blood in stools, diarrhea, hematemesis, melena, nausea/vomiting or swallowing difficulty/pain  Genito-Urinary ROS: negative for - dysuria or incontinence  Musculoskeletal ROS: negative for - joint swelling or muscle pain  Neurological ROS: negative for - confusion, dizziness, gait disturbance, headaches, numbness/tingling, seizures, speech problems, tremors, visual changes or weakness  Dermatological ROS: negative for - rash    Physical Examination:  There were no vitals filed for this visit.      Constitutional: Oriented. No distress.   Head: Normocephalic and atraumatic.   Mouth/Throat: Oropharynx is clear and moist.   Eyes: Conjunctivae normal. EOM are normal.   Neck: Normal range of motion. Neck supple. No rigidity.  No JVD present.   Cardiovascular: Normal rate, regular rhythm, S1&S2 and intact distal pulses.   Pulmonary/Chest: Bilateral respiratory sounds. No wheezes. No rhonchi.    Abdominal: Soft. Bowel sounds present. No  distension, No tenderness.   Musculoskeletal: No tenderness. No edema    Lymphadenopathy: Has no cervical adenopathy.   Neurological: Alert and oriented. Cranial nerve appears intact, No Gross deficit  Skin: Skin is warm and dry. No rash noted.   Psychiatric: Has a normal mood, affect and behavior     Labs:  Reviewed.     ECG: reviewed, SR 79     Studies:   1. 14 day CAM (7/26-03/30/21): SR with average HR 84 (66-127), 22 runs of AT with longest lasting 5 beats, <1% PAC, <1% PVC, no symptoms    2. Limited Echo 04/22/21:   Summary   Normal left ventricle size. There is mild concentric left ventricular   hypertrophy. Overall left ventricular systolic function appears mildly   reduced with an ejection fraction of 40-45%. Mild global hypokinesis.    Echo 02/05/21: EF <20%    The MCOT, echocardiogram, stress test, and coronary angiography/PCI were reviewed by myself and used for my plan of care.     Thank you for allowing me to participate in the care of Sitara A Friedli. All questions and concerns were addressed to the patient/family. Alternatives to my treatment were discussed.     Leslye Peer, RN, am scribing for and in the presence of Dr. Lenna Gilford. 03/23/22 7:53 AM  Pearline Cables, RN      Guadelupe Sabin MD  Cardiac Electrophysiology  [name]

## 2022-05-20 NOTE — Telephone Encounter (Signed)
Formatting of this note is different from the original.  Patient Calling with a scheduling question/request.    Type of visit to Schedule NPV     [facility] Instructions (N/A if none):   If unable to schedule within 1-2 weeks, schedule and add the patient to the wait list, then send a Telephone Encounter to the Scheduling Coordinator pool.    Patient Comments n/a    Patient can be reached at [phone] (home) [phone] [facility])     Electronically signed by Susy Frizzle at 05/20/2022 12:41 PM EDT

## 2022-06-13 MED ORDER — METOPROLOL SUCCINATE ER 25 MG PO TB24
25 MG | ORAL_TABLET | Freq: Every day | ORAL | 3 refills | Status: DC
Start: 2022-06-13 — End: 2022-06-15

## 2022-06-13 NOTE — Telephone Encounter (Signed)
Changed back to the appropriate dose 25 mg

## 2022-06-13 NOTE — Telephone Encounter (Signed)
Left detailed message for pt that Rx was sent to reflect this and she can pick this up

## 2022-06-13 NOTE — Telephone Encounter (Signed)
Patient called stating that the pharmacy won't refill her metoprolol prescription because we have her taking 100 mg on her chart, but she states that she has been taking 25 mg.     [phone]

## 2022-06-15 MED ORDER — METOPROLOL SUCCINATE ER 25 MG PO TB24
25 MG | ORAL_TABLET | Freq: Every day | ORAL | 3 refills | Status: AC
Start: 2022-06-15 — End: 2022-09-06

## 2022-06-15 NOTE — Telephone Encounter (Signed)
Patient says she requested that the medication be sent to he local pharmacy because she is completely out. Please switch from [facility] to [facility], thx.    metoprolol succinate (TOPROL XL) 25 MG extended release tablet [[id number]]     Order Details  Dose: 25 mg Route: Oral Frequency: DAILY   Dispense Quantity: 30 tablet Refills: 3          Sig: Take 1 tablet by mouth daily         [id number] - [place], [place] - [address] - P [phone] - F [phone]

## 2022-06-16 NOTE — Telephone Encounter (Signed)
The patient called stating her HR has been between 170 & 180 B/P 127/84. The patient hasn't been able to take her metoprolol medication in 3 days due to wrong dosing instructions. The patient states she noticed her HR increasing while she was driving she when she got home and rested for a minute she noticed her HR not decreasing. The patient's sister Stanton Kidney who is an Therapist, sports came over to check on her and her HR was 174. The patient was advised to go the ED. The patient can be reached at [phone].

## 2022-06-17 NOTE — Telephone Encounter (Signed)
Spoke with pt, she states she hasn't had her Toprol for a few days due to a mix up in her medications.  Pt took a dose last night and her pulse rate did come down in about 6 hours.  This am her blood pressure was 138/65 P 84.  Instructed pt to monitor vital signs over the weekend and call if her pulse stays in the 90's on a consistent basis.  Also instructed pt not to hesitate going to the ER if an episode like this occurs again.  Pt voiced an understanding.

## 2022-06-28 ENCOUNTER — Encounter: Payer: MEDICAID | Primary: Gerontology

## 2022-07-08 ENCOUNTER — Encounter: Payer: MEDICAID | Attending: Cardiovascular Disease | Primary: Gerontology

## 2022-09-06 MED ORDER — METOPROLOL SUCCINATE ER 25 MG PO TB24
25 MG | ORAL_TABLET | Freq: Every day | ORAL | 1 refills | Status: DC
Start: 2022-09-06 — End: 2023-06-04

## 2022-09-06 NOTE — Telephone Encounter (Signed)
Pt is seeing [place] now

## 2022-09-08 ENCOUNTER — Encounter

## 2022-09-08 MED ORDER — FUROSEMIDE 40 MG PO TABS
40 MG | ORAL_TABLET | Freq: Every day | ORAL | 2 refills | Status: AC
Start: 2022-09-08 — End: ?

## 2022-09-08 NOTE — Telephone Encounter (Signed)
LOV: 03/03/2022  FOV: N/A

## 2022-10-13 ENCOUNTER — Encounter: Payer: MEDICAID | Attending: Nurse Practitioner | Primary: Gerontology

## 2022-10-17 ENCOUNTER — Encounter: Payer: MEDICAID | Attending: Nurse Practitioner | Primary: Gerontology

## 2022-10-17 NOTE — Progress Notes (Unsigned)
[facility]   Electrophysiology  Office Visit  Date: 09/28/2022    No chief complaint on file.      Cardiac HX: Molly [name] is a 62 y.o. woman with a h/o morbid obesity, NHL of the L groin (2006), s/p chemo (2006-2007), SLE, RA, who p/w ongoing SOB, cough, BLE edema for approx prior 30 days, was hypotensive, BNP 4700, found to have an EF<20% per echo, grade III DD, found to be in AFL with a HR of 127 bpm, CT neg for PE, poss elevated R sided pressures, developed hypoxia on 02/10/2021 that resolved with NRB, s/p RFA of AFL (02/12/2021), became hypoxic post procedure and was unable to be extubated, developed AF, placed on an amio gtt, and pressors, extubated 6/27, changed to po amio, found to be COVID +.  13-day CAM (03/16/2021 - 03/30/2021) showed an average HR 84 (66-127), NSR, 22 episodes of AT with the longest lasting 5 beats, no AF/AFL, limited echo (04/22/2021) showed an EF of 40 to 45%., taken off amio/dig (05/11/2021).                Interval History/HPI:                 Patient is here for follow-up for AF/AFL, NICMP chronic systolic CHF.  Patient presented to [facility] with progressively worsening shortness of breath ongoing for 6 weeks.  She had also noted a 40 pound weight gain at that time.  She had an output patient echo ordered by her PCP that showed an EF of less than 20% and she was referred to the emergency room.  She was noted to be in atrial flutter with a poorly controlled ventricular rate.  She was also noted to be fluid overloaded.  She did have a CT of the chest that was negative for PE however possible elevated right-sided pressures.  During her hospitalization on 02/10/2021 she developed hypoxia that resolved with a nonrebreather.  She then underwent a catheter ablation for atrial flutter on 02/12/2021.  Postprocedure she became hypoxic and was unable to be intubated.  She developed AF and was placed on amiodarone drip along with pressors.  She was found to be COVID-positive.  She then  wore an outpatient monitor in July 2022 that showed brief PAT however no AF or AFL.  She had a repeat echo in September 2022 that showed her EF had improved to 40 to 45%.  Her ZOLL LifeVest was discontinued at that time.  She was seen in the office on 05/11/2021 and was taken off of her amiodarone and digoxin.  Today she presents in normal sinus rhythm with 1 PVC.  She has not felt any heart racing, palpitations or irregular heartbeats.  Her blood pressure is on the high side today.  She feels like it is gone up since she was taken off of the amiodarone.  He does monitor her blood pressure at home.  She is trying to stay active and exercise.  She is back to work and is working a lot.  Remains on Eliquis and has had no issues with bleeding or dark tarry stools.  Denies any chest pain, shortness of breath, PND, orthopnea or lower extremity edema              Home medications:   Current Outpatient Medications on File Prior to Visit   Medication Sig Dispense Refill    furosemide (LASIX) 40 MG tablet TAKE 1 TABLET BY MOUTH EVERY DAY 90 tablet  2    metoprolol succinate (TOPROL XL) 25 MG extended release tablet TAKE 1 TABLET BY MOUTH EVERY DAY 90 tablet 1    metoprolol succinate (TOPROL XL) 100 MG extended release tablet Take 1 tablet by mouth daily 180 tablet 0    nabumetone (RELAFEN) 500 MG tablet Take 1 tablet by mouth daily      apixaban (ELIQUIS) 5 MG TABS tablet Take 1 tablet by mouth 2 times daily 180 tablet 3    albuterol sulfate HFA (VENTOLIN HFA) 108 (90 Base) MCG/ACT inhaler Inhale 2 puffs into the lungs 4 times daily as needed for Wheezing 54 g 1    DULoxetine (CYMBALTA) 60 MG extended release capsule Take 1 capsule by mouth daily 30 capsule 5     No current facility-administered medications on file prior to visit.       Past Medical History:   Diagnosis Date    Amenorrhea     Cancer (HCC)     Chronic combined systolic and diastolic CHF, NYHA class 2 (HCC)     History of non-Hodgkin's lymphoma 2006    treated  with radiation and chemo    Systemic lupus     followed by Dr. Harley Hallmark        Past Surgical History:   Procedure Laterality Date    JOINT REPLACEMENT      KNEE ARTHROPLASTY Left 10/25/2016    left total knee replacement using computer navigation ([employer] Robot)    KNEE SURGERY Left     ACL and meniscus repair    LEEP      in 1990's       Allergies   Allergen Reactions    Sulfa Antibiotics Rash       Social History:  Reviewed.  reports that she quit smoking about 18 years ago. Her smoking use included cigarettes. She started smoking about 36 years ago. She has a 9.3 pack-year smoking history. She has never used smokeless tobacco. She reports that she does not currently use alcohol after a past usage of about 5.0 standard drinks of alcohol per week. She reports that she does not use drugs.     Family History:  Reviewed. family history includes Arrhythmia in her brother; Cancer (age of onset: 67) in her sister; Dementia in her mother; Heart Attack in her paternal aunt; High Cholesterol in her brother, brother, and sister; Other in her father; Thyroid Disease in her mother and sister.     Review of System:    Constitutional: No fevers, chills.   Eyes: No visual changes or diplopia. No scleral icterus.  ENT: No Headaches. No mouth sores or sore throat.  Cardiovascular: No for chest pain, Yes for dyspnea on exertion, No for palpitations or No for loss of consciousness. No cough, hemoptysis, No for pleuritic pain, or phlebitis.  Respiratory: No for cough or wheezing. No hematemesis.    Gastrointestinal: No abdominal pain, blood in stools.   Genitourinary: No dysuria, or hematuria.  Musculoskeletal: No gait disturbance,    Integumentary: No rash or pruritis.  Neurological: No headache, change in muscle strength, numbness or tingling.   Psychiatric: No anxiety, or depression.  Endocrine: No temperature intolerance. No excessive thirst, fluid intake, or urination.   Hem/Lymph: No abnormal bruising or bleeding, blood clots or  swollen lymph nodes.  Allergic/Immunologic: No nasal congestion or hives.    Physical Examination:  There were no vitals filed for this visit.        Wt Readings from Last  3 Encounters:   04/12/22 117.9 kg (260 lb)   03/03/22 124.5 kg (274 lb 6.4 oz)   03/02/22 124.7 kg (275 lb)       Constitutional: Oriented. No distress.   Head: Normocephalic and atraumatic.   Mouth/Throat: Oropharynx is clear and moist.   Eyes: Conjunctivae clear without jaunduice. PERRL.  Neck: Neck supple. No rigidity. No JVD present.    Cardiovascular: Normal rate, regular rhythm, S1&S2.    Pulmonary/Chest: Bilateral respiratory sounds. No wheezes, No rhonchi.    Abdominal: Soft. Bowel sounds present. No distension, No tenderness.   Musculoskeletal: No tenderness. No edema    Lymphadenopathy: Has no cervical adenopathy.   Neurological: Alert and oriented. Cranial nerve appears intact, No Gross deficit   Skin: Skin is warm and dry. No rash noted.   Psychiatric: Has a normal mood, affect and behavior     Labs:  Reviewed.   No results for input(s): "NA", "K", "CL", "CO2", "PHOS", "BUN", "CREATININE", "CA", "TSH" in the last 72 hours.  No results for input(s): "WBC", "HGB", "HCT", "MCV", "PLT" in the last 72 hours.  Lab Results   Component Value Date/Time    CKTOTAL 106 11/29/2013 10:44 PM    TROPONINI <0.01 02/10/2021 11:57 PM     No results found for: "BNP"  Lab Results   Component Value Date/Time    PROTIME 15.4 02/15/2021 11:17 AM    PROTIME 15.8 02/10/2021 10:37 AM    PROTIME 11.8 10/25/2016 11:37 AM    INR 1.23 02/15/2021 11:17 AM    INR 1.27 02/10/2021 10:37 AM    INR 1.04 10/25/2016 11:37 AM     Lab Results   Component Value Date/Time    CHOL 249 03/04/2021 12:44 PM    HDL 44 03/04/2021 12:44 PM    TRIG 146 03/04/2021 12:44 PM       ECG: Personally reviewed:     ECHO: 04/22/2021  Conclusions  Summary  Normal left ventricle size. There is mild concentric left ventricular hypertrophy. Overall left ventricular systolic function appears mildly  reduced with an ejection fraction of 40-45%. Mild global hypokinesis.      02/05/2021  Summary  Left ventricular cavity size is severely dilated. Normal left ventricular wall thickness. Ejection fraction is visually estimated to be <20%. There is severe diffuse hypokinesis. Global Longitiudinal Strain= -5.0% Grade III diastolic dysfunction with elevated LV filling pressures. Moderate thickening of leaflets of mitral valve. moderate mitral regurgitation. Mildly dilated right ventricle. Right ventricular systolic function is severely reduced. The tricuspid valve was not well visualized. moderate tricuspid regurgitation. The right atrium is moderately dilated. IVC size is dilated (>2.1 cm) and collapses < 50% with respiration consistent with elevated RA pressure (15 mmHg). Estimated pulmonary artery systolic pressure is mildly elevated at 40 mmHg assuming a right atrial pressure of 15 mmHg.    Stress Test: N/A    Cardiac Angiography: N/A    Problem List:   Patient Active Problem List    Diagnosis Date Noted    Systemic lupus erythematosus (HCC) 09/22/2014    Chronic systolic CHF (congestive heart failure) (HCC) 04/07/2021    Atrial fibrillation and flutter (HCC)     Paroxysmal atrial fibrillation (HCC)     Atrial flutter with rapid ventricular response (HCC)     Dilated cardiomyopathy (HCC)     Acute respiratory failure with hypoxia (HCC)     Lactic acidosis     CHF (congestive heart failure), NYHA class I, acute on chronic, combined (HCC) 02/09/2021  Acute on chronic congestive heart failure (HCC) 02/01/2021    SOB (shortness of breath) 01/29/2021    Acute bacterial sinusitis 12/30/2020    Seasonal allergies 05/08/2019    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     Cardiac HX: Molly [name] is a 62 y.o. woman with a h/o morbid obesity, NHL of the L groin (2006), s/p chemo (2006-2007), SLE, RA, who p/w ongoing SOB, cough, BLE edema for approx prior 30 days, was hypotensive, BNP 4700, found to have an EF<20% per echo, grade III DD, found to be in AFL with a HR of 127 bpm, CT neg for PE, poss elevated R sided pressures, developed hypoxia on 02/10/2021 that resolved with NRB, s/p RFA of AFL (02/12/2021), became hypoxic post procedure and was unable to be intubated, developed AF, placed on an amio gtt, and pressors, extubated 6/27, changed to po amio, found to be COVID +.    CHA2DS2-VASc 3. TSH 4.42  (02/26/2021).      pAF/AFL  - In NSR - no break through per patient  - S/p RFA of AFL w/ pAF noted  - On Eliquis 5 mg - no s/s bleeding - continue  - Reviewed recent labs  - Consider sleep study if recurrent AF/AFL  - Follow-up with Dr. Larwance Sachs in 8 months     CMP  - EF <20%, repeat 40-45%  - NYHA class II  - QRS   - On Toprol XL 25 mg QD - will increase to 50 mg QD  - Did not tolerate losartan  - Following with Dr. Harrison Mons - has appmt 01/04/2022  - Cardiac rehab  - ECG ordered and results personally reviewed     HTN  - BP on the high side in the office  - Will go up on the Toprol XL to 50 mg QD  - Patient to monitor BP at  home and send in in a couple of weeks    EF of 40-45%  ARB/BB for systolic HF  ASA and Statin for CAD  Anticoagulation for AF and heart failure  No tobacco use.      All questions and concerns were addressed to the patient/family. Alternatives to my treatment were discussed. The note was completed using EMR. Every effort was made to ensure accuracy; however, inadvertent computerized transcription errors may be present.     Patient received education regarding their diagnosis, treatment and medications while in the office today.    Ferdinand Lango CNP  [facility]    I  have spent  35 minutes in care of the patient  including direct face to face time, chart preparation, reviewing diagnostic testing, other provider notes and coordinating patient care.

## 2022-11-24 ENCOUNTER — Encounter

## 2023-06-02 NOTE — Telephone Encounter (Signed)
LOV: 03/03/2022  FOV: N/A    MyChart message sent to schedule Physical.

## 2023-06-04 MED ORDER — METOPROLOL SUCCINATE ER 25 MG PO TB24
25 | ORAL_TABLET | Freq: Every day | ORAL | 1 refills | Status: AC
Start: 2023-06-04 — End: ?
